# Patient Record
Sex: Female | Born: 1955 | Race: White | Hispanic: No | Smoking: Former smoker
Health system: Southern US, Community
[De-identification: ages and names within clinical notes are randomized; demographics above are authoritative.]

## PROBLEM LIST (undated history)

## (undated) DIAGNOSIS — C73 Malignant neoplasm of thyroid gland: Secondary | ICD-10-CM

## (undated) DIAGNOSIS — C3490 Malignant neoplasm of unspecified part of unspecified bronchus or lung: Secondary | ICD-10-CM

## (undated) DIAGNOSIS — I10 Essential (primary) hypertension: Secondary | ICD-10-CM

## (undated) DIAGNOSIS — I890 Lymphedema, not elsewhere classified: Secondary | ICD-10-CM

## (undated) DIAGNOSIS — N390 Urinary tract infection, site not specified: Secondary | ICD-10-CM

## (undated) DIAGNOSIS — R51 Headache: Secondary | ICD-10-CM

## (undated) DIAGNOSIS — F410 Panic disorder [episodic paroxysmal anxiety] without agoraphobia: Secondary | ICD-10-CM

## (undated) DIAGNOSIS — Z85850 Personal history of malignant neoplasm of thyroid: Secondary | ICD-10-CM

## (undated) DIAGNOSIS — Z923 Personal history of irradiation: Secondary | ICD-10-CM

## (undated) DIAGNOSIS — R300 Dysuria: Principal | ICD-10-CM

## (undated) DIAGNOSIS — M1990 Unspecified osteoarthritis, unspecified site: Secondary | ICD-10-CM

## (undated) DIAGNOSIS — G4700 Insomnia, unspecified: Principal | ICD-10-CM

## (undated) DIAGNOSIS — E876 Hypokalemia: Secondary | ICD-10-CM

## (undated) DIAGNOSIS — T50905A Adverse effect of unspecified drugs, medicaments and biological substances, initial encounter: Secondary | ICD-10-CM

## (undated) HISTORY — DX: Dysuria: R30.0

## (undated) HISTORY — DX: Adverse effect of unspecified drugs, medicaments and biological substances, initial encounter: T50.905A

## (undated) HISTORY — PX: THYROIDECTOMY: SHX17

## (undated) HISTORY — DX: Headache: R51

## (undated) HISTORY — DX: Essential (primary) hypertension: I10

## (undated) HISTORY — DX: Urinary tract infection, site not specified: N39.0

## (undated) HISTORY — DX: Insomnia, unspecified: G47.00

## (undated) HISTORY — DX: Unspecified osteoarthritis, unspecified site: M19.90

## (undated) HISTORY — DX: Hypokalemia: E87.6

## (undated) HISTORY — DX: Lymphedema, not elsewhere classified: I89.0

---

## 1997-03-08 ENCOUNTER — Ambulatory Visit (HOSPITAL_COMMUNITY): Admission: RE | Admit: 1997-03-08 | Discharge: 1997-03-08 | Payer: Self-pay | Admitting: Obstetrics & Gynecology

## 1998-04-28 ENCOUNTER — Ambulatory Visit (HOSPITAL_COMMUNITY): Admission: RE | Admit: 1998-04-28 | Discharge: 1998-04-28 | Payer: Self-pay | Admitting: *Deleted

## 1998-05-25 ENCOUNTER — Ambulatory Visit (HOSPITAL_COMMUNITY): Admission: RE | Admit: 1998-05-25 | Discharge: 1998-05-25 | Payer: Self-pay | Admitting: *Deleted

## 1998-11-10 ENCOUNTER — Ambulatory Visit (HOSPITAL_COMMUNITY): Admission: RE | Admit: 1998-11-10 | Discharge: 1998-11-10 | Payer: Self-pay | Admitting: *Deleted

## 1999-04-14 ENCOUNTER — Encounter: Payer: Self-pay | Admitting: Neurology

## 1999-04-14 ENCOUNTER — Ambulatory Visit (HOSPITAL_COMMUNITY): Admission: RE | Admit: 1999-04-14 | Discharge: 1999-04-14 | Payer: Self-pay | Admitting: Neurology

## 1999-05-01 ENCOUNTER — Encounter: Admission: RE | Admit: 1999-05-01 | Discharge: 1999-05-01 | Payer: Self-pay | Admitting: Otolaryngology

## 1999-05-01 ENCOUNTER — Encounter: Payer: Self-pay | Admitting: Otolaryngology

## 1999-05-03 ENCOUNTER — Other Ambulatory Visit: Admission: RE | Admit: 1999-05-03 | Discharge: 1999-05-03 | Payer: Self-pay | Admitting: Otolaryngology

## 1999-05-17 ENCOUNTER — Ambulatory Visit (HOSPITAL_COMMUNITY): Admission: RE | Admit: 1999-05-17 | Discharge: 1999-05-17 | Payer: Self-pay | Admitting: Otolaryngology

## 1999-05-17 ENCOUNTER — Encounter: Payer: Self-pay | Admitting: Otolaryngology

## 1999-05-17 ENCOUNTER — Encounter (INDEPENDENT_AMBULATORY_CARE_PROVIDER_SITE_OTHER): Payer: Self-pay | Admitting: Specialist

## 1999-08-04 ENCOUNTER — Ambulatory Visit (HOSPITAL_COMMUNITY): Admission: RE | Admit: 1999-08-04 | Discharge: 1999-08-05 | Payer: Self-pay | Admitting: Otolaryngology

## 1999-08-04 ENCOUNTER — Encounter (INDEPENDENT_AMBULATORY_CARE_PROVIDER_SITE_OTHER): Payer: Self-pay | Admitting: *Deleted

## 1999-11-06 ENCOUNTER — Ambulatory Visit (HOSPITAL_COMMUNITY): Admission: RE | Admit: 1999-11-06 | Discharge: 1999-11-06 | Payer: Self-pay | Admitting: Otolaryngology

## 1999-11-06 ENCOUNTER — Encounter: Payer: Self-pay | Admitting: Otolaryngology

## 1999-11-24 ENCOUNTER — Ambulatory Visit (HOSPITAL_COMMUNITY): Admission: RE | Admit: 1999-11-24 | Discharge: 1999-11-24 | Payer: Self-pay | Admitting: Endocrinology

## 1999-11-24 ENCOUNTER — Encounter: Payer: Self-pay | Admitting: Endocrinology

## 1999-12-14 ENCOUNTER — Ambulatory Visit (HOSPITAL_COMMUNITY): Admission: RE | Admit: 1999-12-14 | Discharge: 1999-12-14 | Payer: Self-pay | Admitting: Endocrinology

## 1999-12-14 ENCOUNTER — Encounter: Payer: Self-pay | Admitting: Endocrinology

## 2000-04-15 ENCOUNTER — Ambulatory Visit (HOSPITAL_COMMUNITY): Admission: RE | Admit: 2000-04-15 | Discharge: 2000-04-15 | Payer: Self-pay | Admitting: Endocrinology

## 2000-04-15 ENCOUNTER — Encounter: Payer: Self-pay | Admitting: Endocrinology

## 2001-09-22 ENCOUNTER — Other Ambulatory Visit: Admission: RE | Admit: 2001-09-22 | Discharge: 2001-09-22 | Payer: Self-pay | Admitting: Obstetrics & Gynecology

## 2002-03-09 ENCOUNTER — Ambulatory Visit (HOSPITAL_COMMUNITY): Admission: RE | Admit: 2002-03-09 | Discharge: 2002-03-09 | Payer: Self-pay | Admitting: Endocrinology

## 2002-03-09 ENCOUNTER — Encounter: Payer: Self-pay | Admitting: Endocrinology

## 2002-03-13 ENCOUNTER — Ambulatory Visit (HOSPITAL_COMMUNITY): Admission: RE | Admit: 2002-03-13 | Discharge: 2002-03-13 | Payer: Self-pay | Admitting: Endocrinology

## 2002-12-02 ENCOUNTER — Other Ambulatory Visit: Admission: RE | Admit: 2002-12-02 | Discharge: 2002-12-02 | Payer: Self-pay | Admitting: Obstetrics & Gynecology

## 2003-02-18 ENCOUNTER — Emergency Department (HOSPITAL_COMMUNITY): Admission: EM | Admit: 2003-02-18 | Discharge: 2003-02-18 | Payer: Self-pay | Admitting: Emergency Medicine

## 2003-09-26 ENCOUNTER — Encounter: Admission: RE | Admit: 2003-09-26 | Discharge: 2003-09-26 | Payer: Self-pay | Admitting: Orthopedic Surgery

## 2004-06-09 ENCOUNTER — Other Ambulatory Visit: Admission: RE | Admit: 2004-06-09 | Discharge: 2004-06-09 | Payer: Self-pay | Admitting: Obstetrics & Gynecology

## 2005-05-07 ENCOUNTER — Encounter (HOSPITAL_COMMUNITY): Admission: RE | Admit: 2005-05-07 | Discharge: 2005-08-05 | Payer: Self-pay | Admitting: Endocrinology

## 2005-07-11 ENCOUNTER — Ambulatory Visit (HOSPITAL_COMMUNITY): Admission: RE | Admit: 2005-07-11 | Discharge: 2005-07-11 | Payer: Self-pay | Admitting: Endocrinology

## 2005-08-24 ENCOUNTER — Encounter: Admission: RE | Admit: 2005-08-24 | Discharge: 2005-08-24 | Payer: Self-pay | Admitting: Endocrinology

## 2005-08-31 ENCOUNTER — Encounter: Admission: RE | Admit: 2005-08-31 | Discharge: 2005-08-31 | Payer: Self-pay | Admitting: Endocrinology

## 2006-09-25 ENCOUNTER — Encounter: Admission: RE | Admit: 2006-09-25 | Discharge: 2006-09-25 | Payer: Self-pay | Admitting: Endocrinology

## 2006-10-23 ENCOUNTER — Ambulatory Visit (HOSPITAL_COMMUNITY): Admission: RE | Admit: 2006-10-23 | Discharge: 2006-10-23 | Payer: Self-pay | Admitting: Endocrinology

## 2008-01-19 ENCOUNTER — Emergency Department (HOSPITAL_COMMUNITY): Admission: EM | Admit: 2008-01-19 | Discharge: 2008-01-19 | Payer: Self-pay | Admitting: Emergency Medicine

## 2008-03-19 ENCOUNTER — Encounter: Admission: RE | Admit: 2008-03-19 | Discharge: 2008-03-19 | Payer: Self-pay | Admitting: Orthopedic Surgery

## 2008-06-07 ENCOUNTER — Encounter: Admission: RE | Admit: 2008-06-07 | Discharge: 2008-06-07 | Payer: Self-pay | Admitting: Internal Medicine

## 2008-06-21 ENCOUNTER — Encounter (HOSPITAL_COMMUNITY): Admission: RE | Admit: 2008-06-21 | Discharge: 2008-09-14 | Payer: Self-pay | Admitting: Internal Medicine

## 2009-05-18 ENCOUNTER — Encounter: Admission: RE | Admit: 2009-05-18 | Discharge: 2009-05-18 | Payer: Self-pay | Admitting: Orthopedic Surgery

## 2009-06-06 ENCOUNTER — Ambulatory Visit (HOSPITAL_BASED_OUTPATIENT_CLINIC_OR_DEPARTMENT_OTHER): Admission: RE | Admit: 2009-06-06 | Discharge: 2009-06-06 | Payer: Self-pay | Admitting: Orthopedic Surgery

## 2009-06-08 ENCOUNTER — Encounter: Admission: RE | Admit: 2009-06-08 | Discharge: 2009-06-08 | Payer: Self-pay | Admitting: Internal Medicine

## 2009-06-11 ENCOUNTER — Emergency Department (HOSPITAL_COMMUNITY): Admission: EM | Admit: 2009-06-11 | Discharge: 2009-06-11 | Payer: Self-pay | Admitting: Emergency Medicine

## 2010-02-18 ENCOUNTER — Encounter: Payer: Self-pay | Admitting: Internal Medicine

## 2010-02-19 ENCOUNTER — Encounter: Payer: Self-pay | Admitting: Neurology

## 2010-02-19 ENCOUNTER — Encounter: Payer: Self-pay | Admitting: Endocrinology

## 2010-04-18 LAB — POCT HEMOGLOBIN-HEMACUE: Hemoglobin: 15.2 g/dL — ABNORMAL HIGH (ref 12.0–15.0)

## 2010-05-04 ENCOUNTER — Inpatient Hospital Stay (INDEPENDENT_AMBULATORY_CARE_PROVIDER_SITE_OTHER)
Admission: RE | Admit: 2010-05-04 | Discharge: 2010-05-04 | Disposition: A | Discharge status: Home / Self Care | Payer: Self-pay | Source: Ambulatory Visit | Attending: Family Medicine | Admitting: Family Medicine

## 2010-05-04 DIAGNOSIS — L0231 Cutaneous abscess of buttock: Secondary | ICD-10-CM

## 2010-05-06 ENCOUNTER — Inpatient Hospital Stay (HOSPITAL_COMMUNITY)
Admission: RE | Admit: 2010-05-06 | Discharge: 2010-05-06 | Disposition: A | Discharge status: Home / Self Care | Payer: Self-pay | Source: Ambulatory Visit | Attending: Emergency Medicine | Admitting: Emergency Medicine

## 2010-05-09 LAB — TSH: TSH: 18.685 u[IU]/mL — ABNORMAL HIGH (ref 0.350–4.500)

## 2010-06-16 NOTE — Op Note (Signed)
[place]. [facility]  Patient:    Jasmine Grant, Jasmine Grant                      MRN: [id number] Proc. Date: 08/04/99 Adm. Date:  [id number] Attending:  Serena Colonel H CC:         Warrick Parisian, M.D.             Genene Churn. Love, M.D.                           Operative Report  PREOPERATIVE DIAGNOSIS: Thyroid mass.  POSTOPERATIVE DIAGNOSIS:  Papillary thyroid carcinoma with metastatic nodes.  OPERATION/PROCEDURE: 1. Total thyroidectomy. 2. Peritracheal node dissection.  ANESTHESIA: General endotracheal.  SURGEON:    Jefry H. Pollyann Kennedy, M.D.  ASSISTANT: Kathy Breach, M.D.  COMPLICATIONS: None.  ESTIMATED BLOOD LOSS: 25 cc.  REFERRING PHYSICIAN: Warrick Parisian, M.D.  OPERATIVE FINDINGS: Enlarged left lobe of the thyroid with multinodular changes, frozen section diagnosis revealed 1.1 cm papillary thyroid carcinoma. Two paratracheal nodes on the left side. Frozen section diagnosis consistent with metastatic papillary thyroid carcinoma. Three small slightly firm pretracheal lymph nodes. No frozen sections obtained. Right lobe of the thyroid was normal to palpation and inspection. This was sent for routine pathologic evaluation without any frozen section. No other palpable neck masses in this region of the neck.  HISTORY OF PRESENT ILLNESS:  This is a 55 year old who had an incidental finding of a thyroid mass and a parapharyngeal mass recently after evaluation of dizziness. 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 Risks, benefits, alternatives and complications of the procedure were explained to the patient. She seemed to understand and agreed to the surgery.  DESCRIPTION OF PROCEDURE:  The patient was taken to the operating room and placed on the operating table in the supine position. Following induction of general endotracheal anesthesia, the neck was prepped and draped in the standard fashion and a controlled  roll was placed underneath the shoulder blades.  Total thyroidectomy:  A low collar incision was outlined with the marking pen and a #10 scalpel was used to incise the skin and subcutaneous tissue. Electrocautery was used to dissect down through the platysma and subplatysmal flap was developed superiorly up to the thyroid cartilage. The thyroid retractor was used to keep the flaps retracted. A midline fascia was divided and the diastasis of the strap muscles was dissected as well. The left lobe was dissected first. Carefully dissection, staying immediately on the capsule of the thyroid was accomplished. The middle thyroid main was doubly ligated between clamps and divided. The superior pole vascular attachments were carefully divided with hemostats and 4-0 silk ties. The superior lobe was brought down and the medial and lower aspect of the lobe were dissected from surrounding tissue. Two paratracheal lymph nodes were identified posterior to the dissection of the gland on the left side. The left lobe was freed up from the ligament and the isthmus was divided. No specific parathyroids were identified. The recurrent nerve was then dissected out of the underlying fascial tissue in order to safely remove the two lymph nodes that were identified. These were both sent for frozen section evaluation and based on the findings, a decision was made to perform a total thyroidectomy. The right side was dissected free in a similar fashion. There were no abnormalities palpated in the right lobe, although the lobe was somewhat large. A  superior parathyroid was identified and preserved with its blood supply. No other parathyroids were identified. The recurrent nerve was identified after the lobe was removed on the right side as well.  Paratracheal node dissection: Careful palpation around the pretracheal fat pad was accomplished and several small, slightly firm nodes were palpable. These were dissected  with their surrounding fibrofatty tissue, staying medial to the inferior thyroid artery in order to not jeopardize the blood supply to the inferior parathyroids. The recurrent nerves were deep to the level of dissection. The fat pad was transected, removed, and sent for pathologic evaluation with routine pathology. The wound was palpated thoroughly and there were no other masses palpable. The wound was irrigated with saline solution and closed in layers using 3-0 chromic on the strap muscle fascia in the platysmal layer and running 4-0 nylon on the skin. A 7 French round drain was left in the wound and exited through the right side of the incision and secured in place with the nylon suture. The patient was awakened from anesthesia, transferred to the recovery room in stable condition. DD:  08/04/99 TD:  08/04/99 Job: [id number]

## 2010-06-16 NOTE — H&P (Signed)
[place]. [facility]  Patient:    Jasmine Grant, Jasmine Grant                      MRN: [id number] Adm. Date:  [id number] Attending:  Serena Colonel H CC:         Warrick Parisian, M.D.                         History and Physical  ADMISSION DIAGNOSIS:  Thyroid mass.  PRIMARY CARE PHYSICIAN:  Dr. Warrick Parisian.  HISTORY OF PRESENT ILLNESS:  This is a 55 year old who last year was having severe dizzy spells.  She underwent an MRI scan which found an incidental finding of a  parapharyngeal space mass on the left side.  Further evaluation revealed a thyroid mass as well on the left side.  She underwent a thyroid scan which revealed a "hot "nodule on the left inferior thyroid.  There were no other abnormalities identified at the time.  She has since undergone a fine needle aspiration biopsy of the thyroid mass and the parapharyngeal mass.  The thyroid mass revealed Hurthles cells.  The parapharyngeal mass revealed benign lymphocytes.  The decision was ade to perform a hemithyroidectomy, to rule out Hurtle cell carcinoma.  PAST MEDICAL HISTORY:  Otherwise unremarkable.  SOCIAL HISTORY:  One-pack-per-day smoker.  REVIEW OF SYSTEMS:  Negative.  PHYSICAL EXAMINATION:  GENERAL:  A healthy-appearing lady.  NECK:  There is a 2.0 to 3.0 cm firm palpable mass in the left inferior thyroid  gland.  There is no other palpable mass. There is slight mass affect with medial displacement of the left tonsil.  The oral cavity, pharynx, and indirect laryngoscopy are all completely normal.  CHEST:  Clear to auscultation.  HEART:  A regular rate and rhythm.  ABDOMEN:  Soft.  IMPRESSION:  Thyroid mass, suspicious for a thyroid carcinoma.  PLAN:  To admit to the hospital and undergo a hemithyroidectomy with possible total thyroidectomy. DD:  08/04/99 TD:  08/04/99 Job: [id number]

## 2010-08-10 ENCOUNTER — Other Ambulatory Visit (HOSPITAL_COMMUNITY): Payer: Self-pay | Admitting: Obstetrics & Gynecology

## 2010-08-10 DIAGNOSIS — Z1231 Encounter for screening mammogram for malignant neoplasm of breast: Secondary | ICD-10-CM

## 2010-08-18 ENCOUNTER — Ambulatory Visit (HOSPITAL_COMMUNITY)
Admission: RE | Admit: 2010-08-18 | Discharge: 2010-08-18 | Disposition: A | Discharge status: Home / Self Care | Payer: Self-pay | Source: Ambulatory Visit | Attending: Obstetrics & Gynecology | Admitting: Obstetrics & Gynecology

## 2010-08-18 DIAGNOSIS — Z1231 Encounter for screening mammogram for malignant neoplasm of breast: Secondary | ICD-10-CM

## 2010-10-16 ENCOUNTER — Other Ambulatory Visit: Payer: Self-pay | Admitting: Internal Medicine

## 2010-10-16 DIAGNOSIS — C73 Malignant neoplasm of thyroid gland: Secondary | ICD-10-CM

## 2010-10-23 ENCOUNTER — Other Ambulatory Visit: Payer: Self-pay

## 2010-10-31 ENCOUNTER — Ambulatory Visit
Admission: RE | Admit: 2010-10-31 | Discharge: 2010-10-31 | Disposition: A | Discharge status: Home / Self Care | Payer: No Typology Code available for payment source | Source: Ambulatory Visit | Attending: Internal Medicine | Admitting: Internal Medicine

## 2010-10-31 DIAGNOSIS — C73 Malignant neoplasm of thyroid gland: Secondary | ICD-10-CM

## 2010-11-03 LAB — POCT I-STAT, CHEM 8
BUN: 7 mg/dL (ref 6–23)
Calcium, Ion: 1.16 mmol/L (ref 1.12–1.32)
Chloride: 106 mEq/L (ref 96–112)
Creatinine, Ser: 0.7 mg/dL (ref 0.4–1.2)
Glucose, Bld: 103 mg/dL — ABNORMAL HIGH (ref 70–99)
HCT: 45 % (ref 36.0–46.0)
Hemoglobin: 15.3 g/dL — ABNORMAL HIGH (ref 12.0–15.0)
Potassium: 4 mEq/L (ref 3.5–5.1)
Sodium: 141 mEq/L (ref 135–145)
TCO2: 29 mmol/L (ref 0–100)

## 2010-11-03 LAB — POCT CARDIAC MARKERS
CKMB, poc: <1 ng/mL — ABNORMAL LOW (ref 1.0–8.0)
Troponin i, poc: <0.05 ng/mL (ref 0.00–0.09)

## 2011-02-25 ENCOUNTER — Encounter (HOSPITAL_COMMUNITY): Payer: Self-pay | Admitting: Emergency Medicine

## 2011-02-25 ENCOUNTER — Emergency Department (HOSPITAL_COMMUNITY): Payer: Self-pay

## 2011-02-25 ENCOUNTER — Other Ambulatory Visit: Payer: Self-pay

## 2011-02-25 ENCOUNTER — Emergency Department (HOSPITAL_COMMUNITY)
Admission: EM | Admit: 2011-02-25 | Discharge: 2011-02-25 | Disposition: A | Discharge status: Home / Self Care | Payer: Self-pay | Attending: Emergency Medicine | Admitting: Emergency Medicine

## 2011-02-25 DIAGNOSIS — R0789 Other chest pain: Secondary | ICD-10-CM | POA: Insufficient documentation

## 2011-02-25 DIAGNOSIS — R002 Palpitations: Secondary | ICD-10-CM | POA: Insufficient documentation

## 2011-02-25 DIAGNOSIS — R059 Cough, unspecified: Secondary | ICD-10-CM | POA: Insufficient documentation

## 2011-02-25 DIAGNOSIS — F411 Generalized anxiety disorder: Secondary | ICD-10-CM | POA: Insufficient documentation

## 2011-02-25 DIAGNOSIS — R0602 Shortness of breath: Secondary | ICD-10-CM | POA: Insufficient documentation

## 2011-02-25 DIAGNOSIS — F419 Anxiety disorder, unspecified: Secondary | ICD-10-CM

## 2011-02-25 DIAGNOSIS — F410 Panic disorder [episodic paroxysmal anxiety] without agoraphobia: Secondary | ICD-10-CM | POA: Insufficient documentation

## 2011-02-25 DIAGNOSIS — R51 Headache: Secondary | ICD-10-CM | POA: Insufficient documentation

## 2011-02-25 DIAGNOSIS — R05 Cough: Secondary | ICD-10-CM | POA: Insufficient documentation

## 2011-02-25 DIAGNOSIS — Z79899 Other long term (current) drug therapy: Secondary | ICD-10-CM | POA: Insufficient documentation

## 2011-02-25 HISTORY — DX: Panic disorder (episodic paroxysmal anxiety): F41.0

## 2011-02-25 LAB — DIFFERENTIAL
Eosinophils Relative: 2 % (ref 0–5)
Lymphocytes Relative: 39 % (ref 12–46)
Lymphs Abs: 3.2 10*3/uL (ref 0.7–4.0)
Monocytes Absolute: 0.6 10*3/uL (ref 0.1–1.0)
Monocytes Relative: 8 % (ref 3–12)

## 2011-02-25 LAB — BASIC METABOLIC PANEL
CO2: 28 mEq/L (ref 19–32)
Calcium: 10.1 mg/dL (ref 8.4–10.5)
Creatinine, Ser: 0.78 mg/dL (ref 0.50–1.10)
Glucose, Bld: 115 mg/dL — ABNORMAL HIGH (ref 70–99)

## 2011-02-25 LAB — CBC
HCT: 40.5 % (ref 36.0–46.0)
MCH: 30.9 pg (ref 26.0–34.0)
MCHC: 33.6 g/dL (ref 30.0–36.0)
MCV: 92 fL (ref 78.0–100.0)
Platelets: 212 10*3/uL (ref 150–400)

## 2011-02-25 LAB — POCT I-STAT TROPONIN I

## 2011-02-25 MED ORDER — METOCLOPRAMIDE HCL 5 MG/ML IJ SOLN
10.0000 mg | Freq: Once | INTRAMUSCULAR | Status: AC
Start: 2011-02-25 — End: 2011-02-25
  Administered 2011-02-25: 10 mg via INTRAVENOUS
  Filled 2011-02-25: qty 2

## 2011-02-25 MED ORDER — DIPHENHYDRAMINE HCL 50 MG/ML IJ SOLN
25.0000 mg | Freq: Once | INTRAMUSCULAR | Status: AC
  Administered 2011-02-25: 21:00:00 via INTRAVENOUS
  Filled 2011-02-25: qty 1

## 2011-02-25 MED ORDER — SODIUM CHLORIDE 0.9 % IV BOLUS (SEPSIS)
1000.0000 mL | Freq: Once | INTRAVENOUS | Status: AC
  Administered 2011-02-25: 1000 mL via INTRAVENOUS

## 2011-02-25 NOTE — ED Notes (Signed)
PT. REPORTS MIDSTERNAL CHEST PAIN WITH SOB AND NAUSEA ONSET THIS EVENING , NO PAIN DURING ENCOUNTER , STATES TOOK 1/2 TAB OF XANAX WITH RELIEF.

## 2011-02-25 NOTE — ED Provider Notes (Signed)
History     CSN: [id number]  Arrival date & time 02/25/11  1911   First MD Initiated Contact with Patient 02/25/11 2028      Chief Complaint  Patient presents with  . Chest Pain    (Consider location/radiation/quality/duration/timing/severity/associated sxs/prior treatment) HPI Comments: Resolved with xanax.  Currently sx free.  Has had intermittent HA for 2 days with no vision changes or neuro sx  Patient is a 56 y.o. female presenting with chest pain. The history is provided by the patient. No language interpreter was used.  Chest Pain The chest pain began 3 - 5 hours ago. Chest pain occurs constantly. The chest pain is unchanged. The pain is associated with coughing. The severity of the pain is mild. The quality of the pain is described as aching. The pain does not radiate. Exacerbated by: anxiety. Primary symptoms include shortness of breath and palpitations. Pertinent negatives for primary symptoms include no fever, no fatigue, no syncope, no cough, no abdominal pain, no nausea, no vomiting and no dizziness.  The palpitations began 3 to 5 hours ago. An episode of palpitations lasts for 11 to 30 minutes. The palpitations have occurred 1 time(s). The palpitations occur while anxious. The palpitations also occurred with shortness of breath. The palpitations did not occur with dizziness.  Pertinent negatives for associated symptoms include no numbness and no weakness.     Past Medical History  Diagnosis Date  . Panic attack     Past Surgical History  Procedure Date  . Thyroidectomy     No family history on file.  History  Substance Use Topics  . Smoking status: Former Games developer  . Smokeless tobacco: Not on file  . Alcohol Use: Yes    OB History    Grav Para Term Preterm Abortions TAB SAB Ect Mult Living                  Review of Systems  Constitutional: Negative for fever, activity change, appetite change and fatigue.  HENT: Negative for congestion, sore throat,  rhinorrhea, neck pain and neck stiffness.   Respiratory: Positive for shortness of breath. Negative for cough.   Cardiovascular: Positive for chest pain and palpitations. Negative for syncope.  Gastrointestinal: Negative for nausea, vomiting and abdominal pain.  Genitourinary: Negative for dysuria, urgency, frequency and flank pain.  Musculoskeletal: Negative for myalgias, back pain and arthralgias.  Neurological: Positive for headaches. Negative for dizziness, weakness, light-headedness and numbness.  All other systems reviewed and are negative.    Allergies  Penicillins  Home Medications   Current Outpatient Rx  Name Route Sig Dispense Refill  . ALPRAZOLAM 0.25 MG PO TABS Oral Take 0.125 mg by mouth daily as needed. For anxiety    . GABAPENTIN 100 MG PO CAPS Oral Take 100 mg by mouth 3 (three) times daily as needed. For nerve pain/headache    . IBUPROFEN 200 MG PO TABS Oral Take 400 mg by mouth once.    Marland Kitchen LEVOTHYROXINE SODIUM 125 MCG PO TABS Oral Take 125 mcg by mouth daily.    Marland Kitchen LEVOTHYROXINE SODIUM 137 MCG PO TABS Oral Take 137 mcg by mouth daily.      BP 128/49  Pulse 71  Temp(Src) 97.1 F (36.2 C) (Oral)  Resp 18  SpO2 99%  Physical Exam  Nursing note and vitals reviewed. Constitutional: She is oriented to person, place, and time. She appears well-developed and well-nourished. No distress.  HENT:  Head: Normocephalic and atraumatic.  Mouth/Throat: Oropharynx is clear  and moist. No oropharyngeal exudate.  Eyes: Conjunctivae and EOM are normal. Pupils are equal, round, and reactive to light.  Neck: Normal range of motion. Neck supple.  Cardiovascular: Normal rate, regular rhythm, normal heart sounds and intact distal pulses.  Exam reveals no gallop and no friction rub.   No murmur heard. Pulmonary/Chest: Effort normal and breath sounds normal. No respiratory distress. She exhibits no tenderness.  Abdominal: Soft. Bowel sounds are normal. There is no tenderness.    Musculoskeletal: Normal range of motion. She exhibits no tenderness.  Neurological: She is alert and oriented to person, place, and time. No cranial nerve deficit.  Skin: Skin is warm and dry.    ED Course  Procedures (including critical care time)   Date: 02/25/2011  Rate: 81  Rhythm: normal sinus rhythm  QRS Axis: normal  Intervals: normal  ST/T Wave abnormalities: normal  Conduction Disutrbances:none  Narrative Interpretation:   Old EKG Reviewed: unchanged  Labs Reviewed  BASIC METABOLIC PANEL - Abnormal; Notable for the following:    Glucose, Bld 115 (*)    All other components within normal limits  CBC  DIFFERENTIAL  POCT I-STAT TROPONIN I  TROPONIN I  I-STAT TROPONIN I   Dg Chest 2 View  02/25/2011  *RADIOLOGY REPORT*  Clinical Data: Chest pressure, shortness of breath, former smoker, panic attack  CHEST - 2 VIEW  Comparison: 05/18/2009  Findings: Borderline enlargement of cardiac silhouette. Mediastinal contours and pulmonary vascularity normal. Minimal peribronchial thickening. No definite infiltrate, pleural effusion or pneumothorax. Slight hyperinflation. Bones unremarkable.  IMPRESSION: Minimal bronchitic changes of hyperinflation. No acute infiltrate.  Original Report Authenticated By: Lollie Marrow, M.D.     1. Chest pain, atypical   2. Anxiety   3. Panic attack       MDM  Delta troponin is negative. I have low concern for cardiac etiology of her pain. She has significant history of anxiety with panic type symptoms I feel this is consistent with this. Symptoms resolved with Xanax. Instructed to take these as needed. Instructed to followup with her primary care physician. I have no concern for pulmonary embolus.        Dayton Bailiff, MD 02/25/11 2325

## 2011-10-15 ENCOUNTER — Other Ambulatory Visit (HOSPITAL_COMMUNITY): Payer: Self-pay | Admitting: Obstetrics & Gynecology

## 2011-10-15 DIAGNOSIS — Z1231 Encounter for screening mammogram for malignant neoplasm of breast: Secondary | ICD-10-CM

## 2011-10-16 ENCOUNTER — Ambulatory Visit (HOSPITAL_COMMUNITY)
Admission: RE | Admit: 2011-10-16 | Discharge: 2011-10-16 | Disposition: A | Discharge status: Home / Self Care | Source: Ambulatory Visit | Attending: Obstetrics & Gynecology | Admitting: Obstetrics & Gynecology

## 2011-10-16 DIAGNOSIS — Z1231 Encounter for screening mammogram for malignant neoplasm of breast: Secondary | ICD-10-CM

## 2011-12-12 ENCOUNTER — Encounter (INDEPENDENT_AMBULATORY_CARE_PROVIDER_SITE_OTHER): Admitting: Ophthalmology

## 2011-12-12 DIAGNOSIS — H43819 Vitreous degeneration, unspecified eye: Secondary | ICD-10-CM

## 2011-12-12 DIAGNOSIS — H2510 Age-related nuclear cataract, unspecified eye: Secondary | ICD-10-CM

## 2011-12-12 DIAGNOSIS — H538 Other visual disturbances: Secondary | ICD-10-CM

## 2011-12-12 DIAGNOSIS — H26499 Other secondary cataract, unspecified eye: Secondary | ICD-10-CM

## 2012-01-02 ENCOUNTER — Other Ambulatory Visit (INDEPENDENT_AMBULATORY_CARE_PROVIDER_SITE_OTHER): Admitting: Ophthalmology

## 2012-02-04 ENCOUNTER — Encounter (HOSPITAL_COMMUNITY): Payer: Self-pay | Admitting: *Deleted

## 2012-02-04 ENCOUNTER — Emergency Department (INDEPENDENT_AMBULATORY_CARE_PROVIDER_SITE_OTHER)
Admission: EM | Admit: 2012-02-04 | Discharge: 2012-02-04 | Disposition: A | Discharge status: Home / Self Care | Payer: Self-pay | Source: Home / Self Care | Attending: Emergency Medicine | Admitting: Emergency Medicine

## 2012-02-04 DIAGNOSIS — L0291 Cutaneous abscess, unspecified: Secondary | ICD-10-CM

## 2012-02-04 MED ORDER — SULFAMETHOXAZOLE-TMP DS 800-160 MG PO TABS: 2.0000 | ORAL_TABLET | Freq: Two times a day (BID) | ORAL | Status: DC

## 2012-02-04 NOTE — ED Notes (Signed)
Abscess on left buttocks - no relief from hot compresses, ointment or antibiotics

## 2012-02-04 NOTE — ED Provider Notes (Signed)
Chief Complaint  Patient presents with  . Abscess    History of Present Illness:    The patient is a 57 year old female with a five-day history of an abscess on her left buttock. She had a similar abscess about a year ago in about the same area. She denies any drainage, fever, chills, or other skin lesions. She has had no history of MRSA or diabetes. She is allergic to penicillin. Her only medication is Synthroid.  Review of Systems:  Other than noted above, the patient denies any of the following symptoms: Systemic:  No fever, chills or sweats. Skin:  No rash or itching.  PMFSH:  Past medical history, family history, social history, meds, and allergies were reviewed.  No history of diabetes or prior history of abscesses or MRSA.  Physical Exam:   Vital signs:  BP 159/79  Pulse 68  Temp 98 F (36.7 C) (Oral)  Resp 18  SpO2 99% Skin:  There is a tender, 2 cm abscess on the middle of the left buttock. This was not draining any pus.  Skin exam was otherwise normal.  No rash. Ext:  Distal pulses were full, patient has full ROM of all joints.  Procedure:  Verbal informed consent was obtained.  The patient was informed of the risks and benefits of the procedure and understands and accepts.  Identity of the patient was verified verbally and by wristband.   The abscess area described above was prepped with Betadine and alcohol and anesthetized with 5 mL of 2% Xylocaine without epinephrine.  Using a #11 scalpel blade, a singe straight incision was made into the area of fluctulence, yielding a small amount of prurulent drainage.  Routine cultures were obtained.  Blunt dissection was used to break up loculations and the resulting wound cavity was packed with 1/4 inch Iodoform gauze.  A sterile pressure dressing was applied.  Assessment:  The encounter diagnosis was Abscess.  Plan:   1.  The following meds were prescribed:   New Prescriptions   SULFAMETHOXAZOLE-TRIMETHOPRIM (BACTRIM DS) 800-160  MG PER TABLET    Take 2 tablets by mouth 2 (two) times daily.   2.  The patient was instructed in symptomatic care and handouts were given. 3.  The patient was instructed to leave the dressing in place and return again in 48 hours for packing removal.   Reuben Likes, MD 02/04/12 [phone]

## 2012-02-07 ENCOUNTER — Emergency Department (INDEPENDENT_AMBULATORY_CARE_PROVIDER_SITE_OTHER)
Admission: EM | Admit: 2012-02-07 | Discharge: 2012-02-07 | Disposition: A | Discharge status: Home / Self Care | Payer: Self-pay | Source: Home / Self Care | Attending: Emergency Medicine | Admitting: Emergency Medicine

## 2012-02-07 ENCOUNTER — Encounter (HOSPITAL_COMMUNITY): Payer: Self-pay

## 2012-02-07 DIAGNOSIS — L0291 Cutaneous abscess, unspecified: Secondary | ICD-10-CM

## 2012-02-07 DIAGNOSIS — L0390 Cellulitis, unspecified: Secondary | ICD-10-CM

## 2012-02-07 LAB — CULTURE, ROUTINE-ABSCESS: Special Requests: NORMAL

## 2012-02-07 NOTE — ED Provider Notes (Addendum)
Chief Complaint  Patient presents with  . Wound Check    History of Present Illness:    Jasmine Grant was seen here 2 days ago by me for an abscess on her left buttock. This was incised and drained. Cultures are now growing out some coagulase negative staph. No other growth on the culture. She's left the packing in place. She's been taking the antibiotics. The pain is better. She does not have any fever or chills.  Review of Systems:  Other than noted above, the patient denies any of the following symptoms: Systemic:  No fever, chills or sweats. Skin:  No rash or itching.  PMFSH:  Past medical history, family history, social history, meds, and allergies were reviewed.  No history of diabetes or prior history of abscesses or MRSA.  Physical Exam:   Vital signs:  BP 137/76  Pulse 70  Temp 98.5 F (36.9 C) (Oral)  Resp 16  SpO2 100% Skin:  The dressing was removed and she has a packed abscess in the left buttock. There is minimal surrounding induration, no erythema. The packing was removed and the wound cavity appeared clean.  Skin exam was otherwise normal.  No rash. Ext:  Distal pulses were full, patient has full ROM of all joints.  Procedure:  Verbal informed consent was obtained.  The patient was informed of the risks and benefits of the procedure and understands and accepts.  Identity of the patient was verified verbally and by wristband.   The packing was removed, the area was cleansed with saline and antibiotic ointment was applied.  A sterile pressure dressing was applied.  Assessment:  The encounter diagnosis was Abscess.  Plan:   1.  The following meds were prescribed:   New Prescriptions   No medications on file   2.  The patient was instructed in symptomatic care and handouts were given. 3.  The patient was instructed to return again as needed.   Reuben Likes, MD 02/07/12 1440  Reuben Likes, MD 02/07/12 1440

## 2012-02-07 NOTE — ED Notes (Signed)
abscess recheck, packing removal; NAD

## 2012-02-07 NOTE — ED Notes (Addendum)
Abscess culture: few Staph. species (coagulase neg.).  Pt. treated with Bactrim DS.  Message sent to Dr. Lorenz Coaster. Vassie Moselle 02/07/2012 1/10 Treatment adequate per Dr. Lorenz Coaster. Vassie Moselle 02/15/2012

## 2012-07-15 ENCOUNTER — Other Ambulatory Visit: Payer: Self-pay

## 2013-12-13 ENCOUNTER — Encounter (HOSPITAL_COMMUNITY): Payer: Self-pay | Admitting: Emergency Medicine

## 2013-12-13 ENCOUNTER — Inpatient Hospital Stay (HOSPITAL_COMMUNITY)
Admission: EM | Admit: 2013-12-13 | Discharge: 2013-12-17 | DRG: 181 | Disposition: A | Discharge status: Home / Self Care | Payer: No Typology Code available for payment source | Attending: Internal Medicine | Admitting: Internal Medicine

## 2013-12-13 ENCOUNTER — Emergency Department (HOSPITAL_COMMUNITY): Payer: No Typology Code available for payment source

## 2013-12-13 ENCOUNTER — Inpatient Hospital Stay (HOSPITAL_COMMUNITY): Payer: No Typology Code available for payment source

## 2013-12-13 DIAGNOSIS — G902 Horner's syndrome: Secondary | ICD-10-CM | POA: Diagnosis present

## 2013-12-13 DIAGNOSIS — T402X5A Adverse effect of other opioids, initial encounter: Secondary | ICD-10-CM | POA: Diagnosis not present

## 2013-12-13 DIAGNOSIS — Z87891 Personal history of nicotine dependence: Secondary | ICD-10-CM | POA: Diagnosis not present

## 2013-12-13 DIAGNOSIS — M542 Cervicalgia: Secondary | ICD-10-CM | POA: Diagnosis present

## 2013-12-13 DIAGNOSIS — M8448XA Pathological fracture, other site, initial encounter for fracture: Secondary | ICD-10-CM | POA: Diagnosis present

## 2013-12-13 DIAGNOSIS — C7801 Secondary malignant neoplasm of right lung: Secondary | ICD-10-CM | POA: Diagnosis present

## 2013-12-13 DIAGNOSIS — I871 Compression of vein: Secondary | ICD-10-CM | POA: Diagnosis present

## 2013-12-13 DIAGNOSIS — R030 Elevated blood-pressure reading, without diagnosis of hypertension: Secondary | ICD-10-CM | POA: Diagnosis not present

## 2013-12-13 DIAGNOSIS — Z6829 Body mass index (BMI) 29.0-29.9, adult: Secondary | ICD-10-CM

## 2013-12-13 DIAGNOSIS — C7951 Secondary malignant neoplasm of bone: Secondary | ICD-10-CM | POA: Diagnosis present

## 2013-12-13 DIAGNOSIS — M899 Disorder of bone, unspecified: Secondary | ICD-10-CM | POA: Diagnosis present

## 2013-12-13 DIAGNOSIS — C787 Secondary malignant neoplasm of liver and intrahepatic bile duct: Secondary | ICD-10-CM | POA: Diagnosis present

## 2013-12-13 DIAGNOSIS — R200 Anesthesia of skin: Secondary | ICD-10-CM

## 2013-12-13 DIAGNOSIS — E039 Hypothyroidism, unspecified: Secondary | ICD-10-CM | POA: Diagnosis present

## 2013-12-13 DIAGNOSIS — R112 Nausea with vomiting, unspecified: Secondary | ICD-10-CM | POA: Diagnosis not present

## 2013-12-13 DIAGNOSIS — Z85850 Personal history of malignant neoplasm of thyroid: Secondary | ICD-10-CM | POA: Diagnosis not present

## 2013-12-13 DIAGNOSIS — C3491 Malignant neoplasm of unspecified part of right bronchus or lung: Secondary | ICD-10-CM

## 2013-12-13 DIAGNOSIS — IMO0002 Reserved for concepts with insufficient information to code with codable children: Secondary | ICD-10-CM

## 2013-12-13 DIAGNOSIS — Z880 Allergy status to penicillin: Secondary | ICD-10-CM

## 2013-12-13 DIAGNOSIS — Z79891 Long term (current) use of opiate analgesic: Secondary | ICD-10-CM | POA: Diagnosis not present

## 2013-12-13 DIAGNOSIS — R229 Localized swelling, mass and lump, unspecified: Secondary | ICD-10-CM

## 2013-12-13 DIAGNOSIS — K5909 Other constipation: Secondary | ICD-10-CM | POA: Diagnosis present

## 2013-12-13 DIAGNOSIS — G9619 Other disorders of meninges, not elsewhere classified: Secondary | ICD-10-CM

## 2013-12-13 DIAGNOSIS — F411 Generalized anxiety disorder: Secondary | ICD-10-CM | POA: Diagnosis present

## 2013-12-13 DIAGNOSIS — Z79899 Other long term (current) drug therapy: Secondary | ICD-10-CM | POA: Diagnosis not present

## 2013-12-13 DIAGNOSIS — Z9889 Other specified postprocedural states: Secondary | ICD-10-CM

## 2013-12-13 DIAGNOSIS — G96198 Other disorders of meninges, not elsewhere classified: Secondary | ICD-10-CM | POA: Diagnosis present

## 2013-12-13 DIAGNOSIS — E663 Overweight: Secondary | ICD-10-CM | POA: Diagnosis present

## 2013-12-13 DIAGNOSIS — M4850XA Collapsed vertebra, not elsewhere classified, site unspecified, initial encounter for fracture: Secondary | ICD-10-CM

## 2013-12-13 DIAGNOSIS — K5903 Drug induced constipation: Secondary | ICD-10-CM | POA: Diagnosis not present

## 2013-12-13 DIAGNOSIS — R918 Other nonspecific abnormal finding of lung field: Secondary | ICD-10-CM

## 2013-12-13 DIAGNOSIS — Z791 Long term (current) use of non-steroidal anti-inflammatories (NSAID): Secondary | ICD-10-CM | POA: Diagnosis not present

## 2013-12-13 DIAGNOSIS — C801 Malignant (primary) neoplasm, unspecified: Secondary | ICD-10-CM

## 2013-12-13 DIAGNOSIS — C3411 Malignant neoplasm of upper lobe, right bronchus or lung: Secondary | ICD-10-CM | POA: Diagnosis present

## 2013-12-13 DIAGNOSIS — IMO0001 Reserved for inherently not codable concepts without codable children: Secondary | ICD-10-CM | POA: Diagnosis present

## 2013-12-13 DIAGNOSIS — R202 Paresthesia of skin: Secondary | ICD-10-CM

## 2013-12-13 HISTORY — DX: Personal history of malignant neoplasm of thyroid: Z85.850

## 2013-12-13 LAB — COMPREHENSIVE METABOLIC PANEL
ALT: 14 U/L (ref 0–35)
AST: 17 U/L (ref 0–37)
Albumin: 3.7 g/dL (ref 3.5–5.2)
Alkaline Phosphatase: 80 U/L (ref 39–117)
Anion gap: 12 (ref 5–15)
BUN: 10 mg/dL (ref 6–23)
CO2: 26 mEq/L (ref 19–32)
CREATININE: 0.58 mg/dL (ref 0.50–1.10)
Calcium: 9.2 mg/dL (ref 8.4–10.5)
Chloride: 101 mEq/L (ref 96–112)
GFR calc non Af Amer: >90 mL/min (ref >90)
GLUCOSE: 129 mg/dL — AB (ref 70–99)
Potassium: 3.9 mEq/L (ref 3.7–5.3)
SODIUM: 139 meq/L (ref 137–147)
TOTAL PROTEIN: 7.5 g/dL (ref 6.0–8.3)
Total Bilirubin: 0.3 mg/dL (ref 0.3–1.2)

## 2013-12-13 LAB — CBC WITH DIFFERENTIAL/PLATELET
Basophils Absolute: 0 10*3/uL (ref 0.0–0.1)
Basophils Relative: 0 % (ref 0–1)
EOS ABS: 0.1 10*3/uL (ref 0.0–0.7)
Eosinophils Relative: 1 % (ref 0–5)
HEMATOCRIT: 42.7 % (ref 36.0–46.0)
HEMOGLOBIN: 13.9 g/dL (ref 12.0–15.0)
LYMPHS ABS: 2.3 10*3/uL (ref 0.7–4.0)
Lymphocytes Relative: 29 % (ref 12–46)
MCH: 30.3 pg (ref 26.0–34.0)
MCHC: 32.6 g/dL (ref 30.0–36.0)
MCV: 93 fL (ref 78.0–100.0)
MONO ABS: 0.5 10*3/uL (ref 0.1–1.0)
MONOS PCT: 7 % (ref 3–12)
NEUTROS ABS: 5 10*3/uL (ref 1.7–7.7)
NEUTROS PCT: 63 % (ref 43–77)
Platelets: 203 10*3/uL (ref 150–400)
RBC: 4.59 MIL/uL (ref 3.87–5.11)
RDW: 12.8 % (ref 11.5–15.5)
WBC: 8 10*3/uL (ref 4.0–10.5)

## 2013-12-13 LAB — I-STAT CHEM 8, ED
BUN: 9 mg/dL (ref 6–23)
CHLORIDE: 103 meq/L (ref 96–112)
Calcium, Ion: 1.19 mmol/L (ref 1.12–1.23)
Creatinine, Ser: 0.6 mg/dL (ref 0.50–1.10)
GLUCOSE: 130 mg/dL — AB (ref 70–99)
HEMATOCRIT: 45 % (ref 36.0–46.0)
Hemoglobin: 15.3 g/dL — ABNORMAL HIGH (ref 12.0–15.0)
POTASSIUM: 3.8 meq/L (ref 3.7–5.3)
SODIUM: 142 meq/L (ref 137–147)
TCO2: 25 mmol/L (ref 0–100)

## 2013-12-13 MED ORDER — GADOBENATE DIMEGLUMINE 529 MG/ML IV SOLN
20.0000 mL | Freq: Once | INTRAVENOUS | Status: AC | PRN
  Administered 2013-12-13: 20 mL via INTRAVENOUS

## 2013-12-13 MED ORDER — PROMETHAZINE HCL 25 MG/ML IJ SOLN
12.5000 mg | Freq: Once | INTRAMUSCULAR | Status: AC
  Administered 2013-12-13: 12.5 mg via INTRAVENOUS
  Filled 2013-12-13: qty 1

## 2013-12-13 MED ORDER — ONDANSETRON HCL 4 MG/2ML IJ SOLN
4.0000 mg | Freq: Once | INTRAMUSCULAR | Status: AC
  Administered 2013-12-13: 4 mg via INTRAVENOUS
  Filled 2013-12-13: qty 2

## 2013-12-13 MED ORDER — HYDROMORPHONE HCL 1 MG/ML IJ SOLN
1.0000 mg | Freq: Once | INTRAMUSCULAR | Status: AC
  Administered 2013-12-13: 1 mg via INTRAVENOUS
  Filled 2013-12-13: qty 1

## 2013-12-13 MED ORDER — DEXAMETHASONE SODIUM PHOSPHATE 10 MG/ML IJ SOLN
10.0000 mg | Freq: Once | INTRAMUSCULAR | Status: AC
  Administered 2013-12-13: 10 mg via INTRAVENOUS
  Filled 2013-12-13: qty 1

## 2013-12-13 MED ORDER — IOHEXOL 300 MG/ML  SOLN
100.0000 mL | Freq: Once | INTRAMUSCULAR | Status: AC | PRN
  Administered 2013-12-13: 100 mL via INTRAVENOUS

## 2013-12-13 MED ORDER — LORAZEPAM 2 MG/ML IJ SOLN
1.0000 mg | Freq: Once | INTRAMUSCULAR | Status: AC
  Administered 2013-12-13: 1 mg via INTRAVENOUS
  Filled 2013-12-13: qty 1

## 2013-12-13 NOTE — ED Notes (Signed)
Dr. Vertell Limber at bedside (neurosurgery)

## 2013-12-13 NOTE — ED Notes (Signed)
Oncology at bedside.

## 2013-12-13 NOTE — Consult Note (Signed)
Reason for Consult: Apical chest mass right upper lung Referring Physician: Maryan Rued, MD  Jasmine Grant is an 58 y.o. female.  HPI: One month history of increasingly severe neck and upper back pain shooting into her right arm and chest.  Patient has noticed right hand weakness and came to ER when she developed numbness transiently into her legs.  This has now resolved.  She denies other areas of weakness.  She has a history of prior ulnar nerve surgery on the left and some residual numbness in this distribution.  She has a history of thyroid cancer 10 years ago.  She is a former smoker.  She denies cough or weight loss.  Past Medical History  Diagnosis Date  . Panic attack     Past Surgical History  Procedure Laterality Date  . Thyroidectomy      No family history on file.  Social History:  reports that she has quit smoking. She does not have any smokeless tobacco history on file. She reports that she drinks alcohol. She reports that she does not use illicit drugs.  Allergies:  Allergies  Allergen Reactions  . Penicillins     Unknown reaction as a child    Medications: I have reviewed the patient's current medications.  Results for orders placed or performed during the hospital encounter of 12/13/13 (from the past 48 hour(s))  CBC with Differential     Status: None   Collection Time: 12/13/13  8:18 PM  Result Value Ref Range   WBC 8.0 4.0 - 10.5 K/uL   RBC 4.59 3.87 - 5.11 MIL/uL   Hemoglobin 13.9 12.0 - 15.0 g/dL   HCT 42.7 36.0 - 46.0 %   MCV 93.0 78.0 - 100.0 fL   MCH 30.3 26.0 - 34.0 pg   MCHC 32.6 30.0 - 36.0 g/dL   RDW 12.8 11.5 - 15.5 %   Platelets 203 150 - 400 K/uL   Neutrophils Relative % 63 43 - 77 %   Neutro Abs 5.0 1.7 - 7.7 K/uL   Lymphocytes Relative 29 12 - 46 %   Lymphs Abs 2.3 0.7 - 4.0 K/uL   Monocytes Relative 7 3 - 12 %   Monocytes Absolute 0.5 0.1 - 1.0 K/uL   Eosinophils Relative 1 0 - 5 %   Eosinophils Absolute 0.1 0.0 - 0.7 K/uL   Basophils  Relative 0 0 - 1 %   Basophils Absolute 0.0 0.0 - 0.1 K/uL  Comprehensive metabolic panel     Status: Abnormal   Collection Time: 12/13/13  8:18 PM  Result Value Ref Range   Sodium 139 137 - 147 mEq/L   Potassium 3.9 3.7 - 5.3 mEq/L   Chloride 101 96 - 112 mEq/L   CO2 26 19 - 32 mEq/L   Glucose, Bld 129 (H) 70 - 99 mg/dL   BUN 10 6 - 23 mg/dL   Creatinine, Ser 0.58 0.50 - 1.10 mg/dL   Calcium 9.2 8.4 - 10.5 mg/dL   Total Protein 7.5 6.0 - 8.3 g/dL   Albumin 3.7 3.5 - 5.2 g/dL   AST 17 0 - 37 U/L   ALT 14 0 - 35 U/L   Alkaline Phosphatase 80 39 - 117 U/L   Total Bilirubin 0.3 0.3 - 1.2 mg/dL   GFR calc non Af Amer >90 >90 mL/min   GFR calc Af Amer >90 >90 mL/min    Comment: (NOTE) The eGFR has been calculated using the CKD EPI equation. This calculation has  not been validated in all clinical situations. eGFR's persistently <90 mL/min signify possible Chronic Kidney Disease.    Anion gap 12 5 - 15  I-stat chem 8, ed     Status: Abnormal   Collection Time: 12/13/13  8:53 PM  Result Value Ref Range   Sodium 142 137 - 147 mEq/L   Potassium 3.8 3.7 - 5.3 mEq/L   Chloride 103 96 - 112 mEq/L   BUN 9 6 - 23 mg/dL   Creatinine, Ser 0.60 0.50 - 1.10 mg/dL   Glucose, Bld 130 (H) 70 - 99 mg/dL   Calcium, Ion 1.19 1.12 - 1.23 mmol/L   TCO2 25 0 - 100 mmol/L   Hemoglobin 15.3 (H) 12.0 - 15.0 g/dL   HCT 45.0 36.0 - 46.0 %    Mr Cervical Spine W Wo Contrast  12/13/2013   CLINICAL DATA:  Severe neck pain and back pain for the past month, worse in the past 2 weeks. BILATERAL hand tingling. Some lower extremity symptoms developing today. Initial encounter.  EXAM: MRI CERVICAL AND THORACIC SPINE WITHOUT AND WITH CONTRAST  TECHNIQUE: Multiplanar and multiecho pulse sequences of the cervical spine, to include the craniocervical junction and cervicothoracic junction, and thoracici spine, were obtained without and with intravenous contrast.  CONTRAST:  74m MULTIHANCE GADOBENATE DIMEGLUMINE 529  MG/ML IV SOLN  COMPARISON:  None.  FINDINGS: MRI CERVICAL SPINE FINDINGS  The patient was unable to remain motionless for the exam. Small or subtle lesions could be overlooked.  There is mild reversal of the normal cervical lordosis. There is degenerative disc disease with disc space narrowing at C5-6 and C6-7 with accompanying endplate reactive changes. A large Schmorl's node projects inferiorly from C6-C7.  No compressive lesion at C4-5 or above. At C5-6, there is mild stenosis due to a central and rightward protrusion. Mild cord flattening. RIGHT subarticular zone narrowing.  At C6-C7, there is moderate central canal stenosis due to a broad-based protrusion. Mild cord flattening. BILATERAL posterior element hypertrophy contributes to spinal stenosis and LEFT greater than RIGHT foraminal narrowing.  Mild annular bulging at C7-T1 with mild facet mediated slip of 2 mm, noncompressive.  RIGHT chest tumor is described in more detail in the thoracic section, but there appears to be RIGHT paravertebral tumor extending cephalad along the course of the brachial plexus, possibly adjacent to the foramen at C6-7 on the RIGHT as seen on image 28 series 20. Further delineation is hampered due to patient motion.  No worrisome osseous lesions in the cervical spine. Mild pannus surrounds the odontoid. Unremarkable posterior fossa. LEFT vertebral dominant.  MRI THORACIC SPINE FINDINGS  The patient was unable to remain motionless for the exam. Small or subtle lesions could be overlooked.  There is a large RIGHT chest paravertebral mass estimated size of 63 x 65 x 95 mm which likely compresses the superior vena cava, and surrounds the RIGHT-sided proximal bronchi. CT chest with contrast recommended. There is direct invasion of the T1 vertebral body on the RIGHT.  There is a early pathologic fracture T1 with slight anterior wedging. The T1 vertebral body is completely replaced with tumor. There is epidural tumor in the spinal canal  ventrally extending equally to the RIGHT and LEFT. Tumor extends into both T1-T2 neural foramina.Mild cord compression is evident. Tumor extends into the brachial plexus along the course of the RIGHT T1 and T2 nerves. Surgical consultation is warranted.  No other areas of visualized thoracic vertebral body tumor or epidural tumor. Advanced degenerative CHANGE AT  T8-9 with end plate reactive changes. T2 hyperintense paravertebral cyst on the RIGHT at T2-3 dorsally, non worrisome.  There are T2 hyperintense lesions in the liver incompletely evaluated, which could represent metastatic disease. CT abdomen and pelvis recommended. No definite pleural effusion.  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 Surgical consultation is warranted.  63 x 65 x 95 mm RIGHT chest paravertebral mass likely compressing the superior vena cava and surrounding the RIGHT-sided bronchi. 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 Possible Pancoast tumor. Brachial plexus involvement on the RIGHT. Possible liver metastases. CT of the chest, abdomen, and pelvis with contrast recommended for further evaluation.   Electronically Signed   By: Rolla Flatten M.D.   On: 12/13/2013 20:16   Mr Thoracic Spine W Wo Contrast  12/13/2013   CLINICAL DATA:  Severe neck pain and back pain for the past month, worse in the past 2 weeks. BILATERAL hand tingling. Some lower extremity symptoms developing today. Initial encounter.  EXAM: MRI CERVICAL AND THORACIC SPINE WITHOUT AND WITH CONTRAST  TECHNIQUE: Multiplanar and multiecho pulse sequences of the cervical spine, to include the craniocervical junction and cervicothoracic junction, and thoracici spine, were obtained without and with intravenous contrast.  CONTRAST:  105m MULTIHANCE GADOBENATE DIMEGLUMINE 529 MG/ML IV SOLN  COMPARISON:  None.  FINDINGS: MRI CERVICAL SPINE FINDINGS  The patient was unable to remain motionless for the exam. Small or subtle lesions could  be overlooked.  There is mild reversal of the normal cervical lordosis. There is degenerative disc disease with disc space narrowing at C5-6 and C6-7 with accompanying endplate reactive changes. A large Schmorl's node projects inferiorly from C6-C7.  No compressive lesion at C4-5 or above. At C5-6, there is mild stenosis due to a central and rightward protrusion. Mild cord flattening. RIGHT subarticular zone narrowing.  At C6-C7, there is moderate central canal stenosis due to a broad-based protrusion. Mild cord flattening. BILATERAL posterior element hypertrophy contributes to spinal stenosis and LEFT greater than RIGHT foraminal narrowing.  Mild annular bulging at C7-T1 with mild facet mediated slip of 2 mm, noncompressive.  RIGHT chest tumor is described in more detail in the thoracic section, but there appears to be RIGHT paravertebral tumor extending cephalad along the course of the brachial plexus, possibly adjacent to the foramen at C6-7 on the RIGHT as seen on image 28 series 20. Further delineation is hampered due to patient motion.  No worrisome osseous lesions in the cervical spine. Mild pannus surrounds the odontoid. Unremarkable posterior fossa. LEFT vertebral dominant.  MRI THORACIC SPINE FINDINGS  The patient was unable to remain motionless for the exam. Small or subtle lesions could be overlooked.  There is a large RIGHT chest paravertebral mass estimated size of 63 x 65 x 95 mm which likely compresses the superior vena cava, and surrounds the RIGHT-sided proximal bronchi. CT chest with contrast recommended. There is direct invasion of the T1 vertebral body on the RIGHT.  There is a early pathologic fracture T1 with slight anterior wedging. The T1 vertebral body is completely replaced with tumor. There is epidural tumor in the spinal canal ventrally extending equally to the RIGHT and LEFT. Tumor extends into both T1-T2 neural foramina.Mild cord compression is evident. Tumor extends into the brachial  plexus along the course of the RIGHT T1 and T2 nerves. Surgical consultation is warranted.  No other areas of visualized thoracic vertebral body tumor or epidural tumor. Advanced degenerative CHANGE AT T8-9 with end plate reactive changes. T2 hyperintense  paravertebral cyst on the RIGHT at T2-3 dorsally, non worrisome.  There are T2 hyperintense lesions in the liver incompletely evaluated, which could represent metastatic disease. CT abdomen and pelvis recommended. No definite pleural effusion.  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 Surgical consultation is warranted.  63 x 65 x 95 mm RIGHT chest paravertebral mass likely compressing the superior vena cava and surrounding the RIGHT-sided bronchi. 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 Possible Pancoast tumor. Brachial plexus involvement on the RIGHT. Possible liver metastases. CT of the chest, abdomen, and pelvis with contrast recommended for further evaluation.   Electronically Signed   By: Rolla Flatten M.D.   On: 12/13/2013 20:16    Review of Systems - Negative except neck pain, weakness, numbness    Blood pressure 137/60, pulse 55, temperature 98.7 F (37.1 C), resp. rate 16, height 6' 1"  (1.854 m), weight 103.42 kg (228 lb), SpO2 97 %. Physical Exam Patient is awake, alert, conversant.  She has neck pain at the cervico-thoracic junction.  PERRL, EOMI.  Face symmetric.  Hearing intact.  No palpable supraclavicular mass or nodes.  Strength full all motor groups except 4/5 right hand intrinsics with right C 8 distribution of numbness.  Reflexes symmetric upper and lower extremities.  Assessment/Plan: Right apical mass, most likely lung cancer.  This is locally invasive and involves the T 1 vertebral body.  It also extends into the ventral epidural space without severe cord compression.  She needs to be admitted to the Medicine service with efforts to control her pain.  She should be started on decadron  (10 mg initially, followed by 4 mg Q 6 H).  She will need a CT-guided needle biopsy of this mass (which can be done by Radiology in AM) and will need Radiation Therapy on an expedited basis (I consulted Dr. Lisbeth Renshaw, who has come to see the patient and will proceed with Simulation in AM).  Dr. Maryan Rued plans to complete metastatic workup with chest, abdomen and pelvis CT scans.  The MRI was worrisome for the possibility of liver metastases.  I have spoken at length with the patient, who understands what is going on, both in terms of likely diagnosis, need for admission and workup.  I do not believe there is any role at the present for neurosurgical intervention.  Peggyann Shoals, MD 12/13/2013, 9:50 PM

## 2013-12-13 NOTE — ED Notes (Signed)
Dr. Arnoldo Morale, internal medicine, at bedside.

## 2013-12-13 NOTE — Consult Note (Signed)
Radiation Oncology         [phone] ________________________________  Name: Jasmine Grant MRN: [id number]  Date: 12/13/2013  DOB: [birth year]   DIAGNOSIS: The primary encounter diagnosis was Mass of lung. Diagnoses of Numbness and tingling of both legs below knees, Mass, Pathologic compression fracture of spine, initial encounter, and Mass in epidural space were also pertinent to this visit.   HISTORY OF PRESENT ILLNESS::Jasmine Grant is a 58 y.o. female who is seen for an initial consultation visit regarding the patient's diagnosis of apparent metastatic disease involving the spine.  The patient presented with severe neck and upper back pain with pain shooting into the right arm and chest. She states that she also has noticed some right-handed weakness and she came into the emergency room. She also notes some numbness briefly in her legs bilaterally. This has resolved.  The patient states that her neck pain has been present for approximately 1 month but became much more severe over the last several days. She has a history of thyroid cancer 10 years ago. No evidence of recurrence with the patient indicating that a small amount of cancer was seen when she underwent a thyroidectomy. She did undergo radioactive iodine treatment subsequently but did not receive any further treatment. She is a former smoker. She denies any weight loss.    PREVIOUS RADIATION THERAPY: Yes radioactive iodine as noted above. The patient has not received any external beam radiation treatment.   PAST MEDICAL HISTORY:  has a past medical history of Panic attack.     PAST SURGICAL HISTORY: Past Surgical History  Procedure Laterality Date  . Thyroidectomy       FAMILY HISTORY: family history is not on file.   SOCIAL HISTORY:  reports that she has quit smoking. She does not have any smokeless tobacco history on file. She reports that she drinks alcohol. She reports that she does not use illicit  drugs.   ALLERGIES: Penicillins   MEDICATIONS:  No current facility-administered medications for this encounter.   Current Outpatient Prescriptions  Medication Sig Dispense Refill  . ALPRAZolam (XANAX) 0.25 MG tablet Take 0.125 mg by mouth daily as needed. For anxiety    . cyclobenzaprine (FLEXERIL) 10 MG tablet Take 10 mg by mouth daily as needed for muscle spasms.    Marland Kitchen ibuprofen (ADVIL,MOTRIN) 200 MG tablet Take 400 mg by mouth once.    Marland Kitchen levothyroxine (SYNTHROID, LEVOTHROID) 125 MCG tablet Take 125 mcg by mouth daily.    Marland Kitchen levothyroxine (SYNTHROID, LEVOTHROID) 137 MCG tablet Take 137 mcg by mouth daily.    . meloxicam (MOBIC) 15 MG tablet Take 15 mg by mouth daily.    . methocarbamol (ROBAXIN) 500 MG tablet Take 500 mg by mouth 2 (two) times daily.    Marland Kitchen oxyCODONE-acetaminophen (PERCOCET/ROXICET) 5-325 MG per tablet Take 1 tablet by mouth every 6 (six) hours as needed for severe pain.    Marland Kitchen sulfamethoxazole-trimethoprim (BACTRIM DS) 800-160 MG per tablet Take 2 tablets by mouth 2 (two) times daily. (Patient not taking: Reported on 12/13/2013) 40 tablet 0     REVIEW OF SYSTEMS:  A 15 point review of systems is documented in the electronic medical record. This was obtained by the nursing staff. However, I reviewed this with the patient to discuss relevant findings and make appropriate changes.  Pertinent items are noted in HPI.    PHYSICAL EXAM:  height is 6\' 1"  (1.854 m) and weight is 228 lb (103.42 kg). Her temperature is 98.7  F (37.1 C). Her blood pressure is 137/60 and her pulse is 55. Her respiration is 16 and oxygen saturation is 97%.   ECOG = 1  0 - Asymptomatic (Fully active, able to carry on all predisease activities without restriction)  1 - Symptomatic but completely ambulatory (Restricted in physically strenuous activity but ambulatory and able to carry out work of a light or sedentary nature. For example, light housework, office work)  2 - Symptomatic, <50% in bed  during the day (Ambulatory and capable of all self care but unable to carry out any work activities. Up and about more than 50% of waking hours)  3 - Symptomatic, >50% in bed, but not bedbound (Capable of only limited self-care, confined to bed or chair 50% or more of waking hours)  4 - Bedbound (Completely disabled. Cannot carry on any self-care. Totally confined to bed or chair)  5 - Death   Eustace Pen MM, Creech RH, Tormey DC, et al. [phone]). "Toxicity and response criteria of the [facility]". [place] Oncol. 5 (6): 649-55  The patient exhibits a slight loss of strength in the right upper extremity.4+/5 with 5 out of 5 strength in the left upper extremity   LABORATORY DATA:  Lab Results  Component Value Date   WBC 8.0 12/13/2013   HGB 15.3* 12/13/2013   HCT 45.0 12/13/2013   MCV 93.0 12/13/2013   PLT 203 12/13/2013   Lab Results  Component Value Date   NA 142 12/13/2013   K 3.8 12/13/2013   CL 103 12/13/2013   CO2 26 12/13/2013   Lab Results  Component Value Date   ALT 14 12/13/2013   AST 17 12/13/2013   ALKPHOS 80 12/13/2013   BILITOT 0.3 12/13/2013      RADIOGRAPHY: Mr Cervical Spine W Wo Contrast  12/13/2013   CLINICAL DATA:  Severe neck pain and back pain for the past month, worse in the past 2 weeks. BILATERAL hand tingling. Some lower extremity symptoms developing today. Initial encounter.  EXAM: MRI CERVICAL AND THORACIC SPINE WITHOUT AND WITH CONTRAST  TECHNIQUE: Multiplanar and multiecho pulse sequences of the cervical spine, to include the craniocervical junction and cervicothoracic junction, and thoracici spine, were obtained without and with intravenous contrast.  CONTRAST:  80mL MULTIHANCE GADOBENATE DIMEGLUMINE 529 MG/ML IV SOLN  COMPARISON:  None.  FINDINGS: MRI CERVICAL SPINE FINDINGS  The patient was unable to remain motionless for the exam. Small or subtle lesions could be overlooked.  There is mild reversal of the normal cervical  lordosis. There is degenerative disc disease with disc space narrowing at C5-6 and C6-7 with accompanying endplate reactive changes. A large Schmorl's node projects inferiorly from C6-C7.  No compressive lesion at C4-5 or above. At C5-6, there is mild stenosis due to a central and rightward protrusion. Mild cord flattening. RIGHT subarticular zone narrowing.  At C6-C7, there is moderate central canal stenosis due to a broad-based protrusion. Mild cord flattening. BILATERAL posterior element hypertrophy contributes to spinal stenosis and LEFT greater than RIGHT foraminal narrowing.  Mild annular bulging at C7-T1 with mild facet mediated slip of 2 mm, noncompressive.  RIGHT chest tumor is described in more detail in the thoracic section, but there appears to be RIGHT paravertebral tumor extending cephalad along the course of the brachial plexus, possibly adjacent to the foramen at C6-7 on the RIGHT as seen on image 28 series 20. Further delineation is hampered due to patient motion.  No worrisome osseous lesions in the  cervical spine. Mild pannus surrounds the odontoid. Unremarkable posterior fossa. LEFT vertebral dominant.  MRI THORACIC SPINE FINDINGS  The patient was unable to remain motionless for the exam. Small or subtle lesions could be overlooked.  There is a large RIGHT chest paravertebral mass estimated size of 63 x 65 x 95 mm which likely compresses the superior vena cava, and surrounds the RIGHT-sided proximal bronchi. CT chest with contrast recommended. There is direct invasion of the T1 vertebral body on the RIGHT.  There is a early pathologic fracture T1 with slight anterior wedging. The T1 vertebral body is completely replaced with tumor. There is epidural tumor in the spinal canal ventrally extending equally to the RIGHT and LEFT. Tumor extends into both T1-T2 neural foramina.Mild cord compression is evident. Tumor extends into the brachial plexus along the course of the RIGHT T1 and T2 nerves.  Surgical consultation is warranted.  No other areas of visualized thoracic vertebral body tumor or epidural tumor. Advanced degenerative CHANGE AT T8-9 with end plate reactive changes. T2 hyperintense paravertebral cyst on the RIGHT at T2-3 dorsally, non worrisome.  There are T2 hyperintense lesions in the liver incompletely evaluated, which could represent metastatic disease. CT abdomen and pelvis recommended. No definite pleural effusion.  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 Surgical consultation is warranted.  63 x 65 x 95 mm RIGHT chest paravertebral mass likely compressing the superior vena cava and surrounding the RIGHT-sided bronchi. 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 Possible Pancoast tumor. Brachial plexus involvement on the RIGHT. Possible liver metastases. CT of the chest, abdomen, and pelvis with contrast recommended for further evaluation.   Electronically Signed   By: Rolla Flatten M.D.   On: 12/13/2013 20:16   Mr Thoracic Spine W Wo Contrast  12/13/2013   CLINICAL DATA:  Severe neck pain and back pain for the past month, worse in the past 2 weeks. BILATERAL hand tingling. Some lower extremity symptoms developing today. Initial encounter.  EXAM: MRI CERVICAL AND THORACIC SPINE WITHOUT AND WITH CONTRAST  TECHNIQUE: Multiplanar and multiecho pulse sequences of the cervical spine, to include the craniocervical junction and cervicothoracic junction, and thoracici spine, were obtained without and with intravenous contrast.  CONTRAST:  1mL MULTIHANCE GADOBENATE DIMEGLUMINE 529 MG/ML IV SOLN  COMPARISON:  None.  FINDINGS: MRI CERVICAL SPINE FINDINGS  The patient was unable to remain motionless for the exam. Small or subtle lesions could be overlooked.  There is mild reversal of the normal cervical lordosis. There is degenerative disc disease with disc space narrowing at C5-6 and C6-7 with accompanying endplate reactive changes. A large Schmorl's node  projects inferiorly from C6-C7.  No compressive lesion at C4-5 or above. At C5-6, there is mild stenosis due to a central and rightward protrusion. Mild cord flattening. RIGHT subarticular zone narrowing.  At C6-C7, there is moderate central canal stenosis due to a broad-based protrusion. Mild cord flattening. BILATERAL posterior element hypertrophy contributes to spinal stenosis and LEFT greater than RIGHT foraminal narrowing.  Mild annular bulging at C7-T1 with mild facet mediated slip of 2 mm, noncompressive.  RIGHT chest tumor is described in more detail in the thoracic section, but there appears to be RIGHT paravertebral tumor extending cephalad along the course of the brachial plexus, possibly adjacent to the foramen at C6-7 on the RIGHT as seen on image 28 series 20. Further delineation is hampered due to patient motion.  No worrisome osseous lesions in the cervical spine. Mild pannus surrounds the odontoid. Unremarkable  posterior fossa. LEFT vertebral dominant.  MRI THORACIC SPINE FINDINGS  The patient was unable to remain motionless for the exam. Small or subtle lesions could be overlooked.  There is a large RIGHT chest paravertebral mass estimated size of 63 x 65 x 95 mm which likely compresses the superior vena cava, and surrounds the RIGHT-sided proximal bronchi. CT chest with contrast recommended. There is direct invasion of the T1 vertebral body on the RIGHT.  There is a early pathologic fracture T1 with slight anterior wedging. The T1 vertebral body is completely replaced with tumor. There is epidural tumor in the spinal canal ventrally extending equally to the RIGHT and LEFT. Tumor extends into both T1-T2 neural foramina.Mild cord compression is evident. Tumor extends into the brachial plexus along the course of the RIGHT T1 and T2 nerves. Surgical consultation is warranted.  No other areas of visualized thoracic vertebral body tumor or epidural tumor. Advanced degenerative CHANGE AT T8-9 with end  plate reactive changes. T2 hyperintense paravertebral cyst on the RIGHT at T2-3 dorsally, non worrisome.  There are T2 hyperintense lesions in the liver incompletely evaluated, which could represent metastatic disease. CT abdomen and pelvis recommended. No definite pleural effusion.  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 Surgical consultation is warranted.  63 x 65 x 95 mm RIGHT chest paravertebral mass likely compressing the superior vena cava and surrounding the RIGHT-sided bronchi. 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 Possible Pancoast tumor. Brachial plexus involvement on the RIGHT. Possible liver metastases. CT of the chest, abdomen, and pelvis with contrast recommended for further evaluation.   Electronically Signed   By: Rolla Flatten M.D.   On: 12/13/2013 20:16       IMPRESSION/PLAN:  The patient's MRI scan shows findings consistent with metastatic cancer. Pathologic fracture of T1 with associated epidural tumor and early spinal cord compression at this level which is associated with a approximate 9.5 cm Pancoast tumor on the right with brachial plexus involvement. Possible liver metastasis seen. This appears most consistent at this time with a diagnosis of lung cancer.  I discussed with the patient the possible role of radiation treatment. At this time we do not have a pathologic diagnosis, and this will be pursued during the patient's hospital stay. In looking at the patient's current imaging, I would favor a 2 week course of treatment with traditional external beam radiation treatment encompassing the lung tumor and area of involvement in the upper T-spine. This would ideally begin after biopsy results have been obtained. This would consist of daily treatment and therefore transferred to [facility] long may be appropriate for the patient.  Recommendations: -decadron -neurosurgery consult completed with Dr. Vertell Limber who I've discussed the patient's  case with -Medical oncology consult -CT scan of the chest abdomen and pelvis -Based on the information available, CT-guided biopsy of the right upper lung mass as soon as possible -CT simulation as soon as possible at [facility] in anticipation of a likely course of palliative external beam radiation treatment - consideration of transfer to [facility]     ________________________________   Jodelle Gross, MD, PhD   **Disclaimer: This note was dictated with voice recognition software. Similar sounding words can inadvertently be transcribed and this note may contain transcription errors which may not have been corrected upon publication of note.**

## 2013-12-13 NOTE — H&P (Addendum)
[facility] Admission History and Physical       Jasmine Grant DQQ:[id number] DOB: 01-10-1956 DOA: 12/13/2013  Referring physician: EDP PCP: No primary care provider on file.  Specialists:   Chief Complaint: Neck and Back Pain  HPI: Jasmine Grant is a 58 y.o. female with a remote history of Thyroid Cancer who presents to the ED with complaints of worsening neck and back pain radiating down both arms x 1 month.    She reports that she had been seen at an [facility] and had x-rays and was given Vicodin but the Vicodin has not relieved the pain.   She was evaluated in the ED and had an MRI of the C-Spine and T-Spine which revealed a T-1 compression fracture with an epidural tumor causing cord compression, and a Left chest paravertebral mass compressing the SVC and Right sided bronchi, along with possible liver mets.   Neurosurgery Dr Vertell Limber and Radiation Oncology Dr. Lisbeth Renshaw were consulted and saw the patient in the ED.  Plans have been made to start radiation treatment in the AM.          Review of Systems:  Constitutional: No Weight Loss, No Weight Gain, Night Sweats, Fevers, Chills, Dizziness, Fatigue, or Generalized Weakness HEENT: No Headaches, Difficulty Swallowing,Tooth/Dental Problems,Sore Throat,  No Sneezing, Rhinitis, Ear Ache, Nasal Congestion, or Post Nasal Drip,  Cardio-vascular:  No Chest pain, Orthopnea, PND, Edema in Lower Extremities, Anasarca, Dizziness, Palpitations  Resp: No Dyspnea, No DOE, No Productive Cough, No Non-Productive Cough, No Hemoptysis, No Wheezing.    GI: No Heartburn, Indigestion, Abdominal Pain, Nausea, Vomiting, Diarrhea, Hematemesis, Hematochezia, Melena, Change in Bowel Habits,  Loss of Appetite  GU: No Dysuria, Change in Color of Urine, No Urgency or Frequency, No Flank pain.  Musculoskeletal: No Joint Pain or Swelling, No Decreased Range of Motion, No Back Pain.  Neurologic: No Syncope, No Seizures, Muscle Weakness, Paresthesia, Vision Disturbance  or Loss, No Diplopia, No Vertigo, No Difficulty Walking,  Skin: No Rash or Lesions. Psych: No Change in Mood or Affect, No Depression or Anxiety, No Memory loss, No Confusion, or Hallucinations   Past Medical History  Diagnosis Date  . Panic attack   . Hx of thyroid cancer     10 years ago      Past Surgical History  Procedure Laterality Date  . Thyroidectomy         Prior to Admission medications   Medication Sig Start Date End Date Taking? Authorizing Provider  ALPRAZolam (XANAX) 0.25 MG tablet Take 0.125 mg by mouth daily as needed. For anxiety   Yes Historical Provider, MD  cyclobenzaprine (FLEXERIL) 10 MG tablet Take 10 mg by mouth daily as needed for muscle spasms.   Yes Historical Provider, MD  ibuprofen (ADVIL,MOTRIN) 200 MG tablet Take 400 mg by mouth once.   Yes Historical Provider, MD  levothyroxine (SYNTHROID, LEVOTHROID) 125 MCG tablet Take 125 mcg by mouth daily.   Yes Historical Provider, MD  levothyroxine (SYNTHROID, LEVOTHROID) 137 MCG tablet Take 137 mcg by mouth daily.   Yes Historical Provider, MD  meloxicam (MOBIC) 15 MG tablet Take 15 mg by mouth daily.   Yes Historical Provider, MD  methocarbamol (ROBAXIN) 500 MG tablet Take 500 mg by mouth 2 (two) times daily.   Yes Historical Provider, MD  oxyCODONE-acetaminophen (PERCOCET/ROXICET) 5-325 MG per tablet Take 1 tablet by mouth every 6 (six) hours as needed for severe pain.   Yes Historical Provider, MD  sulfamethoxazole-trimethoprim (BACTRIM DS) 800-160 MG  per tablet Take 2 tablets by mouth 2 (two) times daily. Patient not taking: Reported on 12/13/2013 02/04/12   Harden Mo, MD      Allergies  Allergen Reactions  . Penicillins     Unknown reaction as a child     Social History:  reports that she has quit smoking. She does not have any smokeless tobacco history on file. She reports that she drinks alcohol. She reports that she does not use illicit drugs.     No family history on file.      Physical Exam:  GEN:  Pleasant Obese 58 y.o. Caucasian female examined and in no acute distress; cooperative with exam Filed Vitals:   12/13/13 2200 12/13/13 2215 12/13/13 2230 12/13/13 2245  BP: 137/75 142/71 143/57 139/62  Pulse: 61 59 61 63  Temp:      Resp:      Height:      Weight:      SpO2: 96% 97% 94% 93%   Blood pressure 139/62, pulse 63, temperature 98.7 F (37.1 C), resp. rate 16, height 6\' 1"  (1.854 m), weight 103.42 kg (228 lb), SpO2 93 %. PSYCH: She is alert and oriented x4; does not appear anxious does not appear depressed; affect is normal HEENT: Normocephalic and Atraumatic, Mucous membranes pink; PERRLA; EOM intact; Fundi:  Benign;  No scleral icterus, Nares: Patent, Oropharynx: Clear, Fair Dentition,    Neck:  FROM, No Cervical Lymphadenopathy nor Thyromegaly or Carotid Bruit; No JVD; Breasts:: Not examined CHEST WALL: No tenderness CHEST: Normal respiration, clear to auscultation bilaterally HEART: Regular rate and rhythm; no murmurs rubs or gallops BACK: No kyphosis or scoliosis; No CVA tenderness ABDOMEN: Positive Bowel Sounds, Obese, Soft Non-Tender; No Masses, No Organomegaly.    Rectal Exam: Not done EXTREMITIES: No Cyanosis, Clubbing, or Edema; No Ulcerations. Genitalia: not examined PULSES: 2+ and symmetric SKIN: Normal hydration no rash or ulceration CNS:  Alert and Oriented x 4, No focal Deficits  Vascular: pulses palpable throughout    Labs on Admission:  Basic Metabolic Panel:  Recent Labs Lab 12/13/13 2018 12/13/13 2053  NA 139 142  K 3.9 3.8  CL 101 103  CO2 26  --   GLUCOSE 129* 130*  BUN 10 9  CREATININE 0.58 0.60  CALCIUM 9.2  --    Liver Function Tests:  Recent Labs Lab 12/13/13 2018  AST 17  ALT 14  ALKPHOS 80  BILITOT 0.3  PROT 7.5  ALBUMIN 3.7   No results for input(s): LIPASE, AMYLASE in the last 168 hours. No results for input(s): AMMONIA in the last 168 hours. CBC:  Recent Labs Lab 12/13/13 2018  12/13/13 2053  WBC 8.0  --   NEUTROABS 5.0  --   HGB 13.9 15.3*  HCT 42.7 45.0  MCV 93.0  --   PLT 203  --    Cardiac Enzymes: No results for input(s): CKTOTAL, CKMB, CKMBINDEX, TROPONINI in the last 168 hours.  BNP (last 3 results) No results for input(s): PROBNP in the last 8760 hours. CBG: No results for input(s): GLUCAP in the last 168 hours.  Radiological Exams on Admission: Mr Cervical Spine W Wo Contrast  12/13/2013   CLINICAL DATA:  Severe neck pain and back pain for the past month, worse in the past 2 weeks. BILATERAL hand tingling. Some lower extremity symptoms developing today. Initial encounter.  EXAM: MRI CERVICAL AND THORACIC SPINE WITHOUT AND WITH CONTRAST  TECHNIQUE: Multiplanar and multiecho pulse sequences of the cervical  spine, to include the craniocervical junction and cervicothoracic junction, and thoracici spine, were obtained without and with intravenous contrast.  CONTRAST:  31mL MULTIHANCE GADOBENATE DIMEGLUMINE 529 MG/ML IV SOLN  COMPARISON:  None.  FINDINGS: MRI CERVICAL SPINE FINDINGS  The patient was unable to remain motionless for the exam. Small or subtle lesions could be overlooked.  There is mild reversal of the normal cervical lordosis. There is degenerative disc disease with disc space narrowing at C5-6 and C6-7 with accompanying endplate reactive changes. A large Schmorl's node projects inferiorly from C6-C7.  No compressive lesion at C4-5 or above. At C5-6, there is mild stenosis due to a central and rightward protrusion. Mild cord flattening. RIGHT subarticular zone narrowing.  At C6-C7, there is moderate central canal stenosis due to a broad-based protrusion. Mild cord flattening. BILATERAL posterior element hypertrophy contributes to spinal stenosis and LEFT greater than RIGHT foraminal narrowing.  Mild annular bulging at C7-T1 with mild facet mediated slip of 2 mm, noncompressive.  RIGHT chest tumor is described in more detail in the thoracic section, but  there appears to be RIGHT paravertebral tumor extending cephalad along the course of the brachial plexus, possibly adjacent to the foramen at C6-7 on the RIGHT as seen on image 28 series 20. Further delineation is hampered due to patient motion.  No worrisome osseous lesions in the cervical spine. Mild pannus surrounds the odontoid. Unremarkable posterior fossa. LEFT vertebral dominant.  MRI THORACIC SPINE FINDINGS  The patient was unable to remain motionless for the exam. Small or subtle lesions could be overlooked.  There is a large RIGHT chest paravertebral mass estimated size of 63 x 65 x 95 mm which likely compresses the superior vena cava, and surrounds the RIGHT-sided proximal bronchi. CT chest with contrast recommended. There is direct invasion of the T1 vertebral body on the RIGHT.  There is a early pathologic fracture T1 with slight anterior wedging. The T1 vertebral body is completely replaced with tumor. There is epidural tumor in the spinal canal ventrally extending equally to the RIGHT and LEFT. Tumor extends into both T1-T2 neural foramina.Mild cord compression is evident. Tumor extends into the brachial plexus along the course of the RIGHT T1 and T2 nerves. Surgical consultation is warranted.  No other areas of visualized thoracic vertebral body tumor or epidural tumor. Advanced degenerative CHANGE AT T8-9 with end plate reactive changes. T2 hyperintense paravertebral cyst on the RIGHT at T2-3 dorsally, non worrisome.  There are T2 hyperintense lesions in the liver incompletely evaluated, which could represent metastatic disease. CT abdomen and pelvis recommended. No definite pleural effusion.  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 Surgical consultation is warranted.  63 x 65 x 95 mm RIGHT chest paravertebral mass likely compressing the superior vena cava and surrounding the RIGHT-sided bronchi. 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 Possible Pancoast  tumor. Brachial plexus involvement on the RIGHT. Possible liver metastases. CT of the chest, abdomen, and pelvis with contrast recommended for further evaluation.   Electronically Signed   By: Rolla Flatten M.D.   On: 12/13/2013 20:16   Mr Thoracic Spine W Wo Contrast  12/13/2013   CLINICAL DATA:  Severe neck pain and back pain for the past month, worse in the past 2 weeks. BILATERAL hand tingling. Some lower extremity symptoms developing today. Initial encounter.  EXAM: MRI CERVICAL AND THORACIC SPINE WITHOUT AND WITH CONTRAST  TECHNIQUE: Multiplanar and multiecho pulse sequences of the cervical spine, to include the craniocervical junction and cervicothoracic  junction, and thoracici spine, were obtained without and with intravenous contrast.  CONTRAST:  108mL MULTIHANCE GADOBENATE DIMEGLUMINE 529 MG/ML IV SOLN  COMPARISON:  None.  FINDINGS: MRI CERVICAL SPINE FINDINGS  The patient was unable to remain motionless for the exam. Small or subtle lesions could be overlooked.  There is mild reversal of the normal cervical lordosis. There is degenerative disc disease with disc space narrowing at C5-6 and C6-7 with accompanying endplate reactive changes. A large Schmorl's node projects inferiorly from C6-C7.  No compressive lesion at C4-5 or above. At C5-6, there is mild stenosis due to a central and rightward protrusion. Mild cord flattening. RIGHT subarticular zone narrowing.  At C6-C7, there is moderate central canal stenosis due to a broad-based protrusion. Mild cord flattening. BILATERAL posterior element hypertrophy contributes to spinal stenosis and LEFT greater than RIGHT foraminal narrowing.  Mild annular bulging at C7-T1 with mild facet mediated slip of 2 mm, noncompressive.  RIGHT chest tumor is described in more detail in the thoracic section, but there appears to be RIGHT paravertebral tumor extending cephalad along the course of the brachial plexus, possibly adjacent to the foramen at C6-7 on the RIGHT as  seen on image 28 series 20. Further delineation is hampered due to patient motion.  No worrisome osseous lesions in the cervical spine. Mild pannus surrounds the odontoid. Unremarkable posterior fossa. LEFT vertebral dominant.  MRI THORACIC SPINE FINDINGS  The patient was unable to remain motionless for the exam. Small or subtle lesions could be overlooked.  There is a large RIGHT chest paravertebral mass estimated size of 63 x 65 x 95 mm which likely compresses the superior vena cava, and surrounds the RIGHT-sided proximal bronchi. CT chest with contrast recommended. There is direct invasion of the T1 vertebral body on the RIGHT.  There is a early pathologic fracture T1 with slight anterior wedging. The T1 vertebral body is completely replaced with tumor. There is epidural tumor in the spinal canal ventrally extending equally to the RIGHT and LEFT. Tumor extends into both T1-T2 neural foramina.Mild cord compression is evident. Tumor extends into the brachial plexus along the course of the RIGHT T1 and T2 nerves. Surgical consultation is warranted.  No other areas of visualized thoracic vertebral body tumor or epidural tumor. Advanced degenerative CHANGE AT T8-9 with end plate reactive changes. T2 hyperintense paravertebral cyst on the RIGHT at T2-3 dorsally, non worrisome.  There are T2 hyperintense lesions in the liver incompletely evaluated, which could represent metastatic disease. CT abdomen and pelvis recommended. No definite pleural effusion.  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 Surgical consultation is warranted.  63 x 65 x 95 mm RIGHT chest paravertebral mass likely compressing the superior vena cava and surrounding the RIGHT-sided bronchi. 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 Possible Pancoast tumor. Brachial plexus involvement on the RIGHT. Possible liver metastases. CT of the chest, abdomen, and pelvis with contrast recommended for further evaluation.    Electronically Signed   By: Rolla Flatten M.D.   On: 12/13/2013 20:16      Assessment/Plan:   58 y.o. female with  Active Problems:   1.   Cancer/Lung mass/Epidural mass   Neurosurgery Consulted Dr Vertell Limber saw in ED   Radiation Oncology Dr Tawni Millers in ED   Plans for Biopsy and Cancer Staging    CT scan of chest ABD and Pelvis ordered        2.   SVC syndrome   Radiation Rx to Treat  3.   DVT Prophylaxis   SCDs     Code Status:   FULL CODE    Family Communication:  No Family Present Disposition Plan:   Inpatient     Time spent:  [place] [facility] Pager [phone]   If [place] Please Contact the Day Rounding Team MD for [facility]  If 7PM-7AM, Please Contact Night-Floor Coverage  [email or web address] Password [email or web address] 12/13/2013, 11:15 PM

## 2013-12-13 NOTE — ED Notes (Signed)
Pt presents with ongoing neck pain that radiates down bilateral arms and down her back for the past month.  Pt admits to seeing her PCP on 11/6 and was told she had arthritis- pt has been taking pain medication at home without relief.  Admits that pain has been significantly worse over the past 2 weeks.  Denies injury, denies loss of bowel or bladder, denies numbness or tingling to lower extremities.  Neuro exam negative.

## 2013-12-13 NOTE — ED Provider Notes (Signed)
CSN: [id number]     Arrival date & time 12/13/13  1447 History   First MD Initiated Contact with Patient 12/13/13 1632     Chief Complaint  Patient presents with  . Neck Pain     (Consider location/radiation/quality/duration/timing/severity/associated sxs/prior Treatment) Patient is a 58 y.o. female presenting with neck pain. The history is provided by the patient.  Neck Pain Pain location:  Generalized neck Quality:  Cramping, shooting, stabbing and burning Pain radiates to:  L shoulder, R arm, R hand, L hand, L arm and R shoulder Pain severity:  Severe Pain is:  Worse during the night Onset quality:  Gradual Duration:  1 month Timing:  Constant Progression:  Worsening Chronicity:  New Context comment:  No injury, surgery or other known cause Relieved by:  Nothing Worsened by:  Position Ineffective treatments:  Analgesics, muscle relaxants, ice, heat, neck support and NSAIDs Associated symptoms: numbness, paresis and weakness   Associated symptoms: no bladder incontinence, no bowel incontinence, no fever, no syncope and no weight loss   Associated symptoms comment:  Numbness going down both arms into the 4/5th digit.  Starting yesterday starting to notice some numbness in the calves bilaterally.  No walking difficulty but mild weakness in the arms Risk factors comment:  History of a thyroidectomy but no radiation   Past Medical History  Diagnosis Date  . Panic attack    Past Surgical History  Procedure Laterality Date  . Thyroidectomy     No family history on file. History  Substance Use Topics  . Smoking status: Former Research scientist (life sciences)  . Smokeless tobacco: Not on file  . Alcohol Use: Yes   OB History    No data available     Review of Systems  Constitutional: Negative for fever and weight loss.  Cardiovascular: Negative for syncope.  Gastrointestinal: Negative for bowel incontinence.  Genitourinary: Negative for bladder incontinence.  Musculoskeletal: Positive for  neck pain.  Neurological: Positive for weakness and numbness.  All other systems reviewed and are negative.     Allergies  Penicillins  Home Medications   Prior to Admission medications   Medication Sig Start Date End Date Taking? Authorizing Provider  ALPRAZolam (XANAX) 0.25 MG tablet Take 0.125 mg by mouth daily as needed. For anxiety    Historical Provider, MD  gabapentin (NEURONTIN) 100 MG capsule Take 100 mg by mouth 3 (three) times daily as needed. For nerve pain/headache    Historical Provider, MD  ibuprofen (ADVIL,MOTRIN) 200 MG tablet Take 400 mg by mouth once.    Historical Provider, MD  levothyroxine (SYNTHROID, LEVOTHROID) 125 MCG tablet Take 125 mcg by mouth daily.    Historical Provider, MD  levothyroxine (SYNTHROID, LEVOTHROID) 137 MCG tablet Take 137 mcg by mouth daily.    Historical Provider, MD  sulfamethoxazole-trimethoprim (BACTRIM DS) 800-160 MG per tablet Take 2 tablets by mouth 2 (two) times daily. 02/04/12   Harden Mo, MD   BP 159/104 mmHg  Pulse 79  Temp(Src) 98.7 F (37.1 C)  Resp 17  Ht 6\' 1"  (1.854 m)  Wt 228 lb (103.42 kg)  BMI 30.09 kg/m2  SpO2 98% Physical Exam  Constitutional: She is oriented to person, place, and time. She appears well-developed and well-nourished. No distress.  Appears uncomfortable  HENT:  Head: Normocephalic and atraumatic.  Mouth/Throat: Oropharynx is clear and moist.  Eyes: Conjunctivae and EOM are normal. Pupils are equal, round, and reactive to light.  Neck: Normal range of motion. Neck supple.  Kyphosis and the  pain is unable to be reproduced with palpation  Cardiovascular: Normal rate, regular rhythm and intact distal pulses.   No murmur heard. Pulmonary/Chest: Effort normal and breath sounds normal. No respiratory distress. She has no wheezes. She has no rales.  Abdominal: Soft. She exhibits no distension. There is no tenderness. There is no rebound and no guarding.  Musculoskeletal: Normal range of motion. She  exhibits no edema or tenderness.       Cervical back: Normal.       Thoracic back: Normal.       Back:  Neurological: She is alert and oriented to person, place, and time.  Decreased sensation in the C8 distribution on both upper extremities. 5 out of 5 muscle strength in the upper and lower extremities. Some decreased sensation in the calf bilaterally  Skin: Skin is warm and dry. No rash noted. No erythema.  Psychiatric: She has a normal mood and affect. Her behavior is normal.  Nursing note and vitals reviewed.   ED Course  Procedures (including critical care time) Labs Review Labs Reviewed  CBC WITH DIFFERENTIAL  COMPREHENSIVE METABOLIC PANEL  I-STAT CHEM 8, ED    Imaging Review Mr Cervical Spine W Wo Contrast  12/13/2013   CLINICAL DATA:  Severe neck pain and back pain for the past month, worse in the past 2 weeks. BILATERAL hand tingling. Some lower extremity symptoms developing today. Initial encounter.  EXAM: MRI CERVICAL AND THORACIC SPINE WITHOUT AND WITH CONTRAST  TECHNIQUE: Multiplanar and multiecho pulse sequences of the cervical spine, to include the craniocervical junction and cervicothoracic junction, and thoracici spine, were obtained without and with intravenous contrast.  CONTRAST:  34mL MULTIHANCE GADOBENATE DIMEGLUMINE 529 MG/ML IV SOLN  COMPARISON:  None.  FINDINGS: MRI CERVICAL SPINE FINDINGS  The patient was unable to remain motionless for the exam. Small or subtle lesions could be overlooked.  There is mild reversal of the normal cervical lordosis. There is degenerative disc disease with disc space narrowing at C5-6 and C6-7 with accompanying endplate reactive changes. A large Schmorl's node projects inferiorly from C6-C7.  No compressive lesion at C4-5 or above. At C5-6, there is mild stenosis due to a central and rightward protrusion. Mild cord flattening. RIGHT subarticular zone narrowing.  At C6-C7, there is moderate central canal stenosis due to a broad-based  protrusion. Mild cord flattening. BILATERAL posterior element hypertrophy contributes to spinal stenosis and LEFT greater than RIGHT foraminal narrowing.  Mild annular bulging at C7-T1 with mild facet mediated slip of 2 mm, noncompressive.  RIGHT chest tumor is described in more detail in the thoracic section, but there appears to be RIGHT paravertebral tumor extending cephalad along the course of the brachial plexus, possibly adjacent to the foramen at C6-7 on the RIGHT as seen on image 28 series 20. Further delineation is hampered due to patient motion.  No worrisome osseous lesions in the cervical spine. Mild pannus surrounds the odontoid. Unremarkable posterior fossa. LEFT vertebral dominant.  MRI THORACIC SPINE FINDINGS  The patient was unable to remain motionless for the exam. Small or subtle lesions could be overlooked.  There is a large RIGHT chest paravertebral mass estimated size of 63 x 65 x 95 mm which likely compresses the superior vena cava, and surrounds the RIGHT-sided proximal bronchi. CT chest with contrast recommended. There is direct invasion of the T1 vertebral body on the RIGHT.  There is a early pathologic fracture T1 with slight anterior wedging. The T1 vertebral body is completely replaced with tumor.  There is epidural tumor in the spinal canal ventrally extending equally to the RIGHT and LEFT. Tumor extends into both T1-T2 neural foramina.Mild cord compression is evident. Tumor extends into the brachial plexus along the course of the RIGHT T1 and T2 nerves. Surgical consultation is warranted.  No other areas of visualized thoracic vertebral body tumor or epidural tumor. Advanced degenerative CHANGE AT T8-9 with end plate reactive changes. T2 hyperintense paravertebral cyst on the RIGHT at T2-3 dorsally, non worrisome.  There are T2 hyperintense lesions in the liver incompletely evaluated, which could represent metastatic disease. CT abdomen and pelvis recommended. No definite pleural  effusion.  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 Surgical consultation is warranted.  63 x 65 x 95 mm RIGHT chest paravertebral mass likely compressing the superior vena cava and surrounding the RIGHT-sided bronchi. 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 Possible Pancoast tumor. Brachial plexus involvement on the RIGHT. Possible liver metastases. CT of the chest, abdomen, and pelvis with contrast recommended for further evaluation.   Electronically Signed   By: Rolla Flatten M.D.   On: 12/13/2013 20:16   Mr Thoracic Spine W Wo Contrast  12/13/2013   CLINICAL DATA:  Severe neck pain and back pain for the past month, worse in the past 2 weeks. BILATERAL hand tingling. Some lower extremity symptoms developing today. Initial encounter.  EXAM: MRI CERVICAL AND THORACIC SPINE WITHOUT AND WITH CONTRAST  TECHNIQUE: Multiplanar and multiecho pulse sequences of the cervical spine, to include the craniocervical junction and cervicothoracic junction, and thoracici spine, were obtained without and with intravenous contrast.  CONTRAST:  33mL MULTIHANCE GADOBENATE DIMEGLUMINE 529 MG/ML IV SOLN  COMPARISON:  None.  FINDINGS: MRI CERVICAL SPINE FINDINGS  The patient was unable to remain motionless for the exam. Small or subtle lesions could be overlooked.  There is mild reversal of the normal cervical lordosis. There is degenerative disc disease with disc space narrowing at C5-6 and C6-7 with accompanying endplate reactive changes. A large Schmorl's node projects inferiorly from C6-C7.  No compressive lesion at C4-5 or above. At C5-6, there is mild stenosis due to a central and rightward protrusion. Mild cord flattening. RIGHT subarticular zone narrowing.  At C6-C7, there is moderate central canal stenosis due to a broad-based protrusion. Mild cord flattening. BILATERAL posterior element hypertrophy contributes to spinal stenosis and LEFT greater than RIGHT foraminal narrowing.   Mild annular bulging at C7-T1 with mild facet mediated slip of 2 mm, noncompressive.  RIGHT chest tumor is described in more detail in the thoracic section, but there appears to be RIGHT paravertebral tumor extending cephalad along the course of the brachial plexus, possibly adjacent to the foramen at C6-7 on the RIGHT as seen on image 28 series 20. Further delineation is hampered due to patient motion.  No worrisome osseous lesions in the cervical spine. Mild pannus surrounds the odontoid. Unremarkable posterior fossa. LEFT vertebral dominant.  MRI THORACIC SPINE FINDINGS  The patient was unable to remain motionless for the exam. Small or subtle lesions could be overlooked.  There is a large RIGHT chest paravertebral mass estimated size of 63 x 65 x 95 mm which likely compresses the superior vena cava, and surrounds the RIGHT-sided proximal bronchi. CT chest with contrast recommended. There is direct invasion of the T1 vertebral body on the RIGHT.  There is a early pathologic fracture T1 with slight anterior wedging. The T1 vertebral body is completely replaced with tumor. There is epidural tumor in the spinal canal  ventrally extending equally to the RIGHT and LEFT. Tumor extends into both T1-T2 neural foramina.Mild cord compression is evident. Tumor extends into the brachial plexus along the course of the RIGHT T1 and T2 nerves. Surgical consultation is warranted.  No other areas of visualized thoracic vertebral body tumor or epidural tumor. Advanced degenerative CHANGE AT T8-9 with end plate reactive changes. T2 hyperintense paravertebral cyst on the RIGHT at T2-3 dorsally, non worrisome.  There are T2 hyperintense lesions in the liver incompletely evaluated, which could represent metastatic disease. CT abdomen and pelvis recommended. No definite pleural effusion.  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 Surgical consultation is warranted.  63 x 65 x  95 mm RIGHT chest paravertebral mass likely compressing the superior vena cava and surrounding the RIGHT-sided bronchi. 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 Possible Pancoast tumor. Brachial plexus involvement on the RIGHT. Possible liver metastases. CT of the chest, abdomen, and pelvis with contrast recommended for further evaluation.   Electronically Signed   By: Rolla Flatten M.D.   On: 12/13/2013 20:16     EKG Interpretation None      MDM   Final diagnoses:  Numbness and tingling of both legs below knees  Mass  Mass of lung  Pathologic compression fracture of spine, initial encounter  Mass in epidural space    Patient with 1 month of worsening C and T-spine pain causing pain to radiate down both arms with numbness in the C8/T1 distribution. However in the last few days she's now started to feel tingling and mild weakness in the back of her legs. She denies any gait difficulty or falls. No fever, IV drug use, recent hospitalizations, surgeries in the past. Patient has tried steroids, Flexeril, Robaxin, oxycodone which have not improved her pain and symptoms are worsening.  It is possible that patient's symptoms are related to arthritis and a pinched C8 nerve however with her new leg symptoms concerning for other possibilities such as lesion versus disc protrusion or other cause for her symptoms.  MRI of the C and T spine pending  8:45 PM MRI revealed a pathologic fracture of the T1 with epidural extension of tumor in cord compression. Appears that patient has a right apical lung mass and also possible liver lesions. CT of the chest and abdomen ordered. Patient's pain is improved after Dilaudid but she still nauseated from the pain medication. Will consult neurosurgery and admit to medicine for further evaluation and treatment  9:24 PM Dr Vertell Limber seeing the pt.  Consulted rad onc and oncology as well  CRITICAL CARE Performed by: Blanchie Dessert Total critical care time: 40 Critical care time was  exclusive of separately billable procedures and treating other patients. Critical care was necessary to treat or prevent imminent or life-threatening deterioration. Critical care was time spent personally by me on the following activities: development of treatment plan with patient and/or surrogate as well as nursing, discussions with consultants, evaluation of patient's response to treatment, examination of patient, obtaining history from patient or surrogate, ordering and performing treatments and interventions, ordering and review of laboratory studies, ordering and review of radiographic studies, pulse oximetry and re-evaluation of patient's condition.   Blanchie Dessert, MD 12/13/13 2128

## 2013-12-14 ENCOUNTER — Telehealth: Payer: Self-pay | Admitting: *Deleted

## 2013-12-14 ENCOUNTER — Inpatient Hospital Stay (HOSPITAL_COMMUNITY): Payer: No Typology Code available for payment source

## 2013-12-14 ENCOUNTER — Encounter (HOSPITAL_COMMUNITY): Payer: Self-pay | Admitting: Radiology

## 2013-12-14 ENCOUNTER — Ambulatory Visit: Payer: No Typology Code available for payment source | Admitting: Radiation Oncology

## 2013-12-14 DIAGNOSIS — Z510 Encounter for antineoplastic radiation therapy: Secondary | ICD-10-CM | POA: Insufficient documentation

## 2013-12-14 DIAGNOSIS — C3490 Malignant neoplasm of unspecified part of unspecified bronchus or lung: Secondary | ICD-10-CM

## 2013-12-14 DIAGNOSIS — E890 Postprocedural hypothyroidism: Secondary | ICD-10-CM

## 2013-12-14 DIAGNOSIS — E663 Overweight: Secondary | ICD-10-CM | POA: Diagnosis present

## 2013-12-14 DIAGNOSIS — E039 Hypothyroidism, unspecified: Secondary | ICD-10-CM | POA: Diagnosis present

## 2013-12-14 DIAGNOSIS — C3411 Malignant neoplasm of upper lobe, right bronchus or lung: Secondary | ICD-10-CM | POA: Insufficient documentation

## 2013-12-14 DIAGNOSIS — F411 Generalized anxiety disorder: Secondary | ICD-10-CM | POA: Diagnosis present

## 2013-12-14 HISTORY — DX: Malignant neoplasm of unspecified part of unspecified bronchus or lung: C34.90

## 2013-12-14 LAB — CBC
HEMATOCRIT: 42.1 % (ref 36.0–46.0)
HEMOGLOBIN: 13.7 g/dL (ref 12.0–15.0)
MCH: 30.4 pg (ref 26.0–34.0)
MCHC: 32.5 g/dL (ref 30.0–36.0)
MCV: 93.3 fL (ref 78.0–100.0)
Platelets: 218 10*3/uL (ref 150–400)
RBC: 4.51 MIL/uL (ref 3.87–5.11)
RDW: 12.9 % (ref 11.5–15.5)
WBC: 5.4 10*3/uL (ref 4.0–10.5)

## 2013-12-14 LAB — BASIC METABOLIC PANEL
Anion gap: 12 (ref 5–15)
BUN: 11 mg/dL (ref 6–23)
CO2: 26 meq/L (ref 19–32)
CREATININE: 0.55 mg/dL (ref 0.50–1.10)
Calcium: 9.1 mg/dL (ref 8.4–10.5)
Chloride: 104 mEq/L (ref 96–112)
GFR calc Af Amer: >90 mL/min (ref >90)
GFR calc non Af Amer: >90 mL/min (ref >90)
Glucose, Bld: 155 mg/dL — ABNORMAL HIGH (ref 70–99)
Potassium: 4.3 mEq/L (ref 3.7–5.3)
Sodium: 142 mEq/L (ref 137–147)

## 2013-12-14 LAB — PROTIME-INR
INR: 1.05 (ref 0.00–1.49)
Prothrombin Time: 13.8 seconds (ref 11.6–15.2)

## 2013-12-14 MED ORDER — ALUM & MAG HYDROXIDE-SIMETH 200-200-20 MG/5ML PO SUSP: 30.0000 mL | Freq: Four times a day (QID) | ORAL | Status: DC | PRN

## 2013-12-14 MED ORDER — SODIUM CHLORIDE 0.9 % IV SOLN
INTRAVENOUS | Status: DC
  Administered 2013-12-14 – 2013-12-16 (×6): via INTRAVENOUS

## 2013-12-14 MED ORDER — SODIUM CHLORIDE 0.9 % IV SOLN
INTRAVENOUS | Status: AC | PRN
  Administered 2013-12-14: 10 mL/h via INTRAVENOUS

## 2013-12-14 MED ORDER — ONDANSETRON HCL 4 MG/2ML IJ SOLN
INTRAMUSCULAR | Status: AC
  Administered 2013-12-14: 4 mg via INTRAVENOUS
  Filled 2013-12-14: qty 2

## 2013-12-14 MED ORDER — ALPRAZOLAM 0.25 MG PO TABS
0.1250 mg | ORAL_TABLET | Freq: Every day | ORAL | Status: DC | PRN
  Administered 2013-12-15: 0.125 mg via ORAL
  Filled 2013-12-14: qty 1

## 2013-12-14 MED ORDER — FENTANYL CITRATE 0.05 MG/ML IJ SOLN
INTRAMUSCULAR | Status: AC
  Filled 2013-12-14: qty 2

## 2013-12-14 MED ORDER — FENTANYL CITRATE 0.05 MG/ML IJ SOLN
INTRAMUSCULAR | Status: AC | PRN
  Administered 2013-12-14: 50 ug via INTRAVENOUS
  Administered 2013-12-14 (×2): 25 ug via INTRAVENOUS

## 2013-12-14 MED ORDER — LEVOTHYROXINE SODIUM 125 MCG PO TABS: 125.0000 ug | ORAL_TABLET | Freq: Every day | ORAL | Status: DC

## 2013-12-14 MED ORDER — ACETAMINOPHEN 325 MG PO TABS: 650.0000 mg | ORAL_TABLET | Freq: Four times a day (QID) | ORAL | Status: DC | PRN

## 2013-12-14 MED ORDER — ONDANSETRON HCL 4 MG/2ML IJ SOLN
4.0000 mg | Freq: Four times a day (QID) | INTRAMUSCULAR | Status: DC | PRN
  Administered 2013-12-14 – 2013-12-16 (×4): 4 mg via INTRAVENOUS
  Filled 2013-12-14 (×3): qty 2

## 2013-12-14 MED ORDER — MIDAZOLAM HCL 2 MG/2ML IJ SOLN
INTRAMUSCULAR | Status: AC
  Filled 2013-12-14: qty 2

## 2013-12-14 MED ORDER — ACETAMINOPHEN 650 MG RE SUPP: 650.0000 mg | Freq: Four times a day (QID) | RECTAL | Status: DC | PRN

## 2013-12-14 MED ORDER — MIDAZOLAM HCL 2 MG/2ML IJ SOLN
INTRAMUSCULAR | Status: AC | PRN
  Administered 2013-12-14 (×2): 0.5 mg via INTRAVENOUS
  Administered 2013-12-14: 1 mg via INTRAVENOUS

## 2013-12-14 MED ORDER — LEVOTHYROXINE SODIUM 150 MCG PO TABS
262.0000 ug | ORAL_TABLET | Freq: Every day | ORAL | Status: DC
  Administered 2013-12-14 – 2013-12-17 (×4): 262 ug via ORAL
  Filled 2013-12-14 (×7): qty 1

## 2013-12-14 MED ORDER — LIDOCAINE HCL 1 % IJ SOLN
INTRAMUSCULAR | Status: AC
  Filled 2013-12-14: qty 20

## 2013-12-14 MED ORDER — LEVOTHYROXINE SODIUM 137 MCG PO TABS: 137.0000 ug | ORAL_TABLET | Freq: Every day | ORAL | Status: DC

## 2013-12-14 MED ORDER — ONDANSETRON HCL 4 MG PO TABS: 4.0000 mg | ORAL_TABLET | Freq: Four times a day (QID) | ORAL | Status: DC | PRN

## 2013-12-14 MED ORDER — HYDROMORPHONE HCL 1 MG/ML IJ SOLN
0.5000 mg | INTRAMUSCULAR | Status: DC | PRN
  Administered 2013-12-14: 1 mg via INTRAVENOUS
  Filled 2013-12-14: qty 1

## 2013-12-14 MED ORDER — LIDOCAINE-EPINEPHRINE 1 %-1:100000 IJ SOLN
INTRAMUSCULAR | Status: AC
  Filled 2013-12-14: qty 1

## 2013-12-14 MED ORDER — OXYCODONE HCL 5 MG PO TABS
5.0000 mg | ORAL_TABLET | ORAL | Status: DC | PRN
  Administered 2013-12-15 (×2): 5 mg via ORAL
  Filled 2013-12-14 (×2): qty 1

## 2013-12-14 MED ORDER — SODIUM CHLORIDE 0.9 % IV SOLN: INTRAVENOUS | Status: AC

## 2013-12-14 NOTE — Sedation Documentation (Signed)
Band aid to right upper back, C/D/I at this time.

## 2013-12-14 NOTE — Progress Notes (Signed)
Received a call from [facility] patient scheduled for CT Stimulation, Dr. Vernell Barrier paged and stated he will check patient and will update me .

## 2013-12-14 NOTE — Sedation Documentation (Signed)
SpO2 100% on room air.

## 2013-12-14 NOTE — Progress Notes (Signed)
PROGRESS NOTE  DEBANHI BLAKER NOB:[id number] DOB: 12/14/1955 DOA: 12/13/2013 PCP: No primary care provider on file.  HPI/Recap of past 76 hours: 58 year old female with past medical history of thyroid cancer, hypothyroidism and previous smoker admitted on 11/15 after coming in for 1 month of worsening neck pain and back pain with associated arm numbness and found to have suspected metastatic destructive lesion of T1 plus large right lung mass concerning for possible compression of SVC and right primary bronchus.  Neurosurgery and radiation oncology consulted and patient admitted to hospitalist service.  Patient seen this morning, tearful and stressed about diagnosis. Denies any shortness of breath, and neck pain manageable.  Assessment/Plan: Principal Problem:   Epidural mass: concerning for metastatic lesion, certainly with spinal cord involvement. Started on Decadron. Seen by neurosurgery, with no plans for neurosurgical intervention at this time. Patient will need radiation therapy with plans to transfer patient to [facility] long hospital following CT biopsy.  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 Following this, patient be transferred to [facility] for initiation of radiation treatments as well as oncology follow-up.    Generalized anxiety disorder: On when necessary benzo   Hypothyroidism: On Synthroid   Overweight (BMI 25.0-29.9): Patient meets criteria with BMI greater than 25   Code Status: full code  Family Communication: patient's husband, but not her son's aware of diagnosis, she declined for me to call them any further and states that she would keep them informed  Disposition Plan: transfer to [facility] following biopsy   Consultants:  Neurosurgery  Interventional radiology  Oncology-will consult once  patient transferred  Radiation oncology  Procedures:  Plan for CT guided biopsy  Antibiotics:  none   Objective: BP 141/65 mmHg  Pulse 72  Temp(Src) 98.3 F (36.8 C) (Oral)  Resp 18  Ht 6\' 1"  (1.854 m)  Wt 102.014 kg (224 lb 14.4 oz)  BMI 29.68 kg/m2  SpO2 96%  Intake/Output Summary (Last 24 hours) at 12/14/13 1425 Last data filed at 12/14/13 1300  Gross per 24 hour  Intake    600 ml  Output      0 ml  Net    600 ml   Filed Weights   12/13/13 1521 12/13/13 2353  Weight: 103.42 kg (228 lb) 102.014 kg (224 lb 14.4 oz)    Exam:   General:  Alert and oriented, understandably upset  Cardiovascular: regular rate and rhythm, S1-S2  Respiratory: clear to auscultation bilaterally  Abdomen: soft, nontender, nondistended, positive bowel sounds  Musculoskeletal: no clubbing or cyanosis, trace edema bilaterally   Data Reviewed: Basic Metabolic Panel:  Recent Labs Lab 12/13/13 2018 12/13/13 2053 12/14/13 0521  NA 139 142 142  K 3.9 3.8 4.3  CL 101 103 104  CO2 26  --  26  GLUCOSE 129* 130* 155*  BUN 10 9 11   CREATININE 0.58 0.60 0.55  CALCIUM 9.2  --  9.1   Liver Function Tests:  Recent Labs Lab 12/13/13 2018  AST 17  ALT 14  ALKPHOS 80  BILITOT 0.3  PROT 7.5  ALBUMIN 3.7   No results for input(s): LIPASE, AMYLASE in the last 168 hours. No results for input(s): AMMONIA in the last 168 hours. CBC:  Recent Labs Lab 12/13/13 2018 12/13/13 2053 12/14/13 0521  WBC 8.0  --  5.4  NEUTROABS 5.0  --   --  HGB 13.9 15.3* 13.7  HCT 42.7 45.0 42.1  MCV 93.0  --  93.3  PLT 203  --  218   Cardiac Enzymes:   No results for input(s): CKTOTAL, CKMB, CKMBINDEX, TROPONINI in the last 168 hours. BNP (last 3 results) No results for input(s): PROBNP in the last 8760 hours. CBG: No results for input(s): GLUCAP in the last 168 hours.  No results found for this or any previous visit (from the past 240 hour(s)).   Studies: Ct Chest W  Contrast  12/13/2013   CLINICAL DATA:  Neck and back pain worsening over 2 weeks. Mass found dead MRI today. History of thyroid cancer with thyroidectomy 10 years ago.  EXAM: CT CHEST, ABDOMEN, AND PELVIS WITH CONTRAST  TECHNIQUE: Multidetector CT imaging of the chest, abdomen and pelvis was performed following the standard protocol during bolus administration of intravenous contrast.  CONTRAST:  176mL OMNIPAQUE IOHEXOL 300 MG/ML  SOLN  COMPARISON:  MRI cervical spine 12/13/2013  FINDINGS: CT CHEST FINDINGS  Large heterogeneous appearing soft tissue mass in the right lung apex extending down to the superior aspect of the right hilum. Medially, the mass extends to the trachea and esophagus. With anterior extension to the right subclavian vein. No associated rib destruction. The mass measures about 7.9 x 6.3 x 7.3 cm. This is most likely primary lesion although metastasis possible. Additional pretracheal lymphadenopathy measuring 2.1 cm diameter, likely metastatic.  Normal heart size. Normal caliber thoracic aorta with calcification. Esophagus is decompressed. Dependent atelectasis in the lung bases. Volume loss and mild infiltration in the right upper lung is probably postobstructive. Mild emphysematous changes in the apices. Indeterminate nodule in the right lung base measuring 5 mm diameter. Additional smaller indeterminate nodules also demonstrated on the right lung.  Destructive bone lesion involving the T1 vertebra with associated soft tissue component.  CT ABDOMEN AND PELVIS FINDINGS  Multiple circumscribed low-attenuation lesions demonstrated throughout the liver, largest measuring 12 mm diameter. Appearance is worrisome for metastatic disease although multiple cyst could also have this appearance. The gallbladder, spleen, pancreas, adrenal glands, kidneys, abdominal aorta, inferior vena cava, and retroperitoneal lymph nodes are unremarkable. Diverticulum of the second portion of the duodenum containing gas  and fluid. Stomach, small bowel, and colon are otherwise unremarkable. Small bowel are decompressed in the colon is stool filled. No free air or free fluid in the abdomen. Small umbilical hernia containing fat.  Pelvis: Uterus and ovaries are not enlarged. Bladder wall is not thickened. No free or loculated pelvic fluid collections. No pelvic mass or lymphadenopathy. Appendix is normal.  Normal alignment of lumbar spine with degenerative changes present. Suggestion of focal lucent bone lesion in the right side of the sacrum. No additional destructive bone lesions are suggested.  IMPRESSION: Large mass in the right lung apex likely representing primary lung carcinoma or large metastasis. Metastases demonstrated in the pretracheal lymph nodes, T1 vertebral body, and probably liver. Additional focal lucent bone lesions suggested in the right sacrum.   Electronically Signed   By: Lucienne Capers M.D.   On: 12/13/2013 23:56   Mr Cervical Spine W Wo Contrast  12/13/2013   CLINICAL DATA:  Severe neck pain and back pain for the past month, worse in the past 2 weeks. BILATERAL hand tingling. Some lower extremity symptoms developing today. Initial encounter.  EXAM: MRI CERVICAL AND THORACIC SPINE WITHOUT AND WITH CONTRAST  TECHNIQUE: Multiplanar and multiecho pulse sequences of the cervical spine, to include the craniocervical junction and cervicothoracic junction, and  thoracici spine, were obtained without and with intravenous contrast.  CONTRAST:  55mL MULTIHANCE GADOBENATE DIMEGLUMINE 529 MG/ML IV SOLN  COMPARISON:  None.  FINDINGS: MRI CERVICAL SPINE FINDINGS  The patient was unable to remain motionless for the exam. Small or subtle lesions could be overlooked.  There is mild reversal of the normal cervical lordosis. There is degenerative disc disease with disc space narrowing at C5-6 and C6-7 with accompanying endplate reactive changes. A large Schmorl's node projects inferiorly from C6-C7.  No compressive lesion at  C4-5 or above. At C5-6, there is mild stenosis due to a central and rightward protrusion. Mild cord flattening. RIGHT subarticular zone narrowing.  At C6-C7, there is moderate central canal stenosis due to a broad-based protrusion. Mild cord flattening. BILATERAL posterior element hypertrophy contributes to spinal stenosis and LEFT greater than RIGHT foraminal narrowing.  Mild annular bulging at C7-T1 with mild facet mediated slip of 2 mm, noncompressive.  RIGHT chest tumor is described in more detail in the thoracic section, but there appears to be RIGHT paravertebral tumor extending cephalad along the course of the brachial plexus, possibly adjacent to the foramen at C6-7 on the RIGHT as seen on image 28 series 20. Further delineation is hampered due to patient motion.  No worrisome osseous lesions in the cervical spine. Mild pannus surrounds the odontoid. Unremarkable posterior fossa. LEFT vertebral dominant.  MRI THORACIC SPINE FINDINGS  The patient was unable to remain motionless for the exam. Small or subtle lesions could be overlooked.  There is a large RIGHT chest paravertebral mass estimated size of 63 x 65 x 95 mm which likely compresses the superior vena cava, and surrounds the RIGHT-sided proximal bronchi. CT chest with contrast recommended. There is direct invasion of the T1 vertebral body on the RIGHT.  There is a early pathologic fracture T1 with slight anterior wedging. The T1 vertebral body is completely replaced with tumor. There is epidural tumor in the spinal canal ventrally extending equally to the RIGHT and LEFT. Tumor extends into both T1-T2 neural foramina.Mild cord compression is evident. Tumor extends into the brachial plexus along the course of the RIGHT T1 and T2 nerves. Surgical consultation is warranted.  No other areas of visualized thoracic vertebral body tumor or epidural tumor. Advanced degenerative CHANGE AT T8-9 with end plate reactive changes. T2 hyperintense paravertebral cyst  on the RIGHT at T2-3 dorsally, non worrisome.  There are T2 hyperintense lesions in the liver incompletely evaluated, which could represent metastatic disease. CT abdomen and pelvis recommended. No definite pleural effusion.  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 Surgical consultation is warranted.  63 x 65 x 95 mm RIGHT chest paravertebral mass likely compressing the superior vena cava and surrounding the RIGHT-sided bronchi. 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 Possible Pancoast tumor. Brachial plexus involvement on the RIGHT. Possible liver metastases. CT of the chest, abdomen, and pelvis with contrast recommended for further evaluation.   Electronically Signed   By: Rolla Flatten M.D.   On: 12/13/2013 20:16   Mr Thoracic Spine W Wo Contrast  12/13/2013   CLINICAL DATA:  Severe neck pain and back pain for the past month, worse in the past 2 weeks. BILATERAL hand tingling. Some lower extremity symptoms developing today. Initial encounter.  EXAM: MRI CERVICAL AND THORACIC SPINE WITHOUT AND WITH CONTRAST  TECHNIQUE: Multiplanar and multiecho pulse sequences of the cervical spine, to include the craniocervical junction and cervicothoracic junction, and thoracici spine, were obtained without and with intravenous  contrast.  CONTRAST:  44mL MULTIHANCE GADOBENATE DIMEGLUMINE 529 MG/ML IV SOLN  COMPARISON:  None.  FINDINGS: MRI CERVICAL SPINE FINDINGS  The patient was unable to remain motionless for the exam. Small or subtle lesions could be overlooked.  There is mild reversal of the normal cervical lordosis. There is degenerative disc disease with disc space narrowing at C5-6 and C6-7 with accompanying endplate reactive changes. A large Schmorl's node projects inferiorly from C6-C7.  No compressive lesion at C4-5 or above. At C5-6, there is mild stenosis due to a central and rightward protrusion. Mild cord flattening. RIGHT subarticular zone narrowing.  At C6-C7,  there is moderate central canal stenosis due to a broad-based protrusion. Mild cord flattening. BILATERAL posterior element hypertrophy contributes to spinal stenosis and LEFT greater than RIGHT foraminal narrowing.  Mild annular bulging at C7-T1 with mild facet mediated slip of 2 mm, noncompressive.  RIGHT chest tumor is described in more detail in the thoracic section, but there appears to be RIGHT paravertebral tumor extending cephalad along the course of the brachial plexus, possibly adjacent to the foramen at C6-7 on the RIGHT as seen on image 28 series 20. Further delineation is hampered due to patient motion.  No worrisome osseous lesions in the cervical spine. Mild pannus surrounds the odontoid. Unremarkable posterior fossa. LEFT vertebral dominant.  MRI THORACIC SPINE FINDINGS  The patient was unable to remain motionless for the exam. Small or subtle lesions could be overlooked.  There is a large RIGHT chest paravertebral mass estimated size of 63 x 65 x 95 mm which likely compresses the superior vena cava, and surrounds the RIGHT-sided proximal bronchi. CT chest with contrast recommended. There is direct invasion of the T1 vertebral body on the RIGHT.  There is a early pathologic fracture T1 with slight anterior wedging. The T1 vertebral body is completely replaced with tumor. There is epidural tumor in the spinal canal ventrally extending equally to the RIGHT and LEFT. Tumor extends into both T1-T2 neural foramina.Mild cord compression is evident. Tumor extends into the brachial plexus along the course of the RIGHT T1 and T2 nerves. Surgical consultation is warranted.  No other areas of visualized thoracic vertebral body tumor or epidural tumor. Advanced degenerative CHANGE AT T8-9 with end plate reactive changes. T2 hyperintense paravertebral cyst on the RIGHT at T2-3 dorsally, non worrisome.  There are T2 hyperintense lesions in the liver incompletely evaluated, which could represent metastatic disease.  CT abdomen and pelvis recommended. No definite pleural effusion.  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 Surgical consultation is warranted.  63 x 65 x 95 mm RIGHT chest paravertebral mass likely compressing the superior vena cava and surrounding the RIGHT-sided bronchi. 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 Possible Pancoast tumor. Brachial plexus involvement on the RIGHT. Possible liver metastases. CT of the chest, abdomen, and pelvis with contrast recommended for further evaluation.   Electronically Signed   By: Rolla Flatten M.D.   On: 12/13/2013 20:16   Ct Abdomen Pelvis W Contrast  12/13/2013   CLINICAL DATA:  Neck and back pain worsening over 2 weeks. Mass found dead MRI today. History of thyroid cancer with thyroidectomy 10 years ago.  EXAM: CT CHEST, ABDOMEN, AND PELVIS WITH CONTRAST  TECHNIQUE: Multidetector CT imaging of the chest, abdomen and pelvis was performed following the standard protocol during bolus administration of intravenous contrast.  CONTRAST:  111mL OMNIPAQUE IOHEXOL 300 MG/ML  SOLN  COMPARISON:  MRI cervical spine 12/13/2013  FINDINGS: CT CHEST  FINDINGS  Large heterogeneous appearing soft tissue mass in the right lung apex extending down to the superior aspect of the right hilum. Medially, the mass extends to the trachea and esophagus. With anterior extension to the right subclavian vein. No associated rib destruction. The mass measures about 7.9 x 6.3 x 7.3 cm. This is most likely primary lesion although metastasis possible. Additional pretracheal lymphadenopathy measuring 2.1 cm diameter, likely metastatic.  Normal heart size. Normal caliber thoracic aorta with calcification. Esophagus is decompressed. Dependent atelectasis in the lung bases. Volume loss and mild infiltration in the right upper lung is probably postobstructive. Mild emphysematous changes in the apices. Indeterminate nodule in the right lung base measuring 5 mm  diameter. Additional smaller indeterminate nodules also demonstrated on the right lung.  Destructive bone lesion involving the T1 vertebra with associated soft tissue component.  CT ABDOMEN AND PELVIS FINDINGS  Multiple circumscribed low-attenuation lesions demonstrated throughout the liver, largest measuring 12 mm diameter. Appearance is worrisome for metastatic disease although multiple cyst could also have this appearance. The gallbladder, spleen, pancreas, adrenal glands, kidneys, abdominal aorta, inferior vena cava, and retroperitoneal lymph nodes are unremarkable. Diverticulum of the second portion of the duodenum containing gas and fluid. Stomach, small bowel, and colon are otherwise unremarkable. Small bowel are decompressed in the colon is stool filled. No free air or free fluid in the abdomen. Small umbilical hernia containing fat.  Pelvis: Uterus and ovaries are not enlarged. Bladder wall is not thickened. No free or loculated pelvic fluid collections. No pelvic mass or lymphadenopathy. Appendix is normal.  Normal alignment of lumbar spine with degenerative changes present. Suggestion of focal lucent bone lesion in the right side of the sacrum. No additional destructive bone lesions are suggested.  IMPRESSION: Large mass in the right lung apex likely representing primary lung carcinoma or large metastasis. Metastases demonstrated in the pretracheal lymph nodes, T1 vertebral body, and probably liver. Additional focal lucent bone lesions suggested in the right sacrum.   Electronically Signed   By: Lucienne Capers M.D.   On: 12/13/2013 23:56    Scheduled Meds: . levothyroxine  262 mcg Oral QAC breakfast    Continuous Infusions: . sodium chloride 75 mL/hr at 12/14/13 1403     Time spent: 25 minutes  [facility] Pager [phone]. If 7PM-7AM, please contact night-coverage at [email or web address], password [facility] 12/14/2013, 2:25 PM  LOS: 1 day

## 2013-12-14 NOTE — Sedation Documentation (Signed)
Patient denies pain and is resting comfortably.  

## 2013-12-14 NOTE — Telephone Encounter (Signed)
Called [facility], again to check on status of patient coming for CT Simulation, spoke with  [facility], she is waiting on Dr. Elijah Birk to see patient to discuss , biopsy not scheduled as yet,she will call back shortly, she hasn't called [facility] as yet until she hears from MD there 11:29 AM

## 2013-12-14 NOTE — Telephone Encounter (Signed)
Patient has spinal cord compression, so, treatment delays should be minimized/avoided.

## 2013-12-14 NOTE — Procedures (Signed)
CT guided core biopsies of right apical lung mass.  3 cores obtained without complication.  See Radiology report.

## 2013-12-14 NOTE — Progress Notes (Signed)
HPI: Jasmine Grant is an 58 y.o. female admitted with findings of large right apical lung mass with local invasion to T1 vertebral body and epidural space, which is symptomatic. Workup and treatment recs under way, IR is requested to obtain tissue biopsy for diagnosis. Chart, PMHx, meds, imaging reviewed. Has been NPO all day.  Past Medical History:  Past Medical History  Diagnosis Date  . Panic attack   . Hx of thyroid cancer     10 years ago    Surgical History:  Past Surgical History  Procedure Laterality Date  . Thyroidectomy      Family History: No family history on file.  Social History:  reports that she has quit smoking. She does not have any smokeless tobacco history on file. She reports that she drinks alcohol. She reports that she does not use illicit drugs.  Allergies:  Allergies  Allergen Reactions  . Penicillins     Unknown reaction as a child    Medications: Current facility-administered medications: 0.9 %  sodium chloride infusion, , Intravenous, Continuous, Theressa Millard, MD, Last Rate: 75 mL/hr at 12/14/13 0020;  acetaminophen (TYLENOL) tablet 650 mg, 650 mg, Oral, Q6H PRN **OR** acetaminophen (TYLENOL) suppository 650 mg, 650 mg, Rectal, Q6H PRN, Theressa Millard, MD;  ALPRAZolam Duanne Moron) tablet 0.125 mg, 0.125 mg, Oral, Daily PRN, Theressa Millard, MD alum & mag hydroxide-simeth (MAALOX/MYLANTA) 200-200-20 MG/5ML suspension 30 mL, 30 mL, Oral, Q6H PRN, Theressa Millard, MD;  HYDROmorphone (DILAUDID) injection 0.5-1 mg, 0.5-1 mg, Intravenous, Q3H PRN, Theressa Millard, MD, 1 mg at 12/14/13 0023;  levothyroxine (SYNTHROID, LEVOTHROID) tablet 262 mcg, 262 mcg, Oral, QAC breakfast, Theressa Millard, MD, 262 mcg at 12/14/13 0759 ondansetron (ZOFRAN) tablet 4 mg, 4 mg, Oral, Q6H PRN **OR** ondansetron (ZOFRAN) injection 4 mg, 4 mg, Intravenous, Q6H PRN, Theressa Millard, MD;  oxyCODONE (Oxy IR/ROXICODONE) immediate release tablet 5 mg, 5 mg, Oral,  Q4H PRN, Theressa Millard, MD   ROS: See HPI for pertinent findings, otherwise complete 10 system review negative.  Physical Exam: Blood pressure 117/74, pulse 71, temperature 97.8 F (36.6 C), temperature source Oral, resp. rate 16, height _0  (1.854 m), weight 224 lb 14.4 oz (102.014 kg), SpO2 95 %. General: NAD, A&O x 3 ENT: unremarkable Lungs: CTA without w/r/r Heart: Regular   Labs: Results for orders placed or performed during the hospital encounter of 12/13/13 (from the past 48 hour(s))  CBC with Differential     Status: None   Collection Time: 12/13/13  8:18 PM  Result Value Ref Range   WBC 8.0 4.0 - 10.5 K/uL   RBC 4.59 3.87 - 5.11 MIL/uL   Hemoglobin 13.9 12.0 - 15.0 g/dL   HCT 42.7 36.0 - 46.0 %   MCV 93.0 78.0 - 100.0 fL   MCH 30.3 26.0 - 34.0 pg   MCHC 32.6 30.0 - 36.0 g/dL   RDW 12.8 11.5 - 15.5 %   Platelets 203 150 - 400 K/uL   Neutrophils Relative % 63 43 - 77 %   Neutro Abs 5.0 1.7 - 7.7 K/uL   Lymphocytes Relative 29 12 - 46 %   Lymphs Abs 2.3 0.7 - 4.0 K/uL   Monocytes Relative 7 3 - 12 %   Monocytes Absolute 0.5 0.1 - 1.0 K/uL   Eosinophils Relative 1 0 - 5 %   Eosinophils Absolute 0.1 0.0 - 0.7 K/uL   Basophils Relative 0 0 - 1 %  Basophils Absolute 0.0 0.0 - 0.1 K/uL  Comprehensive metabolic panel     Status: Abnormal   Collection Time: 12/13/13  8:18 PM  Result Value Ref Range   Sodium 139 137 - 147 mEq/L   Potassium 3.9 3.7 - 5.3 mEq/L   Chloride 101 96 - 112 mEq/L   CO2 26 19 - 32 mEq/L   Glucose, Bld 129 (H) 70 - 99 mg/dL   BUN 10 6 - 23 mg/dL   Creatinine, Ser 6.12 0.50 - 1.10 mg/dL   Calcium 9.2 8.4 - 43.2 mg/dL   Total Protein 7.5 6.0 - 8.3 g/dL   Albumin 3.7 3.5 - 5.2 g/dL   AST 17 0 - 37 U/L   ALT 14 0 - 35 U/L   Alkaline Phosphatase 80 39 - 117 U/L   Total Bilirubin 0.3 0.3 - 1.2 mg/dL   GFR calc non Af Amer >90 >90 mL/min   GFR calc Af Amer >90 >90 mL/min    Comment: (NOTE) The eGFR has been calculated using the CKD EPI  equation. This calculation has not been validated in all clinical situations. eGFR's persistently <90 mL/min signify possible Chronic Kidney Disease.    Anion gap 12 5 - 15  I-stat chem 8, ed     Status: Abnormal   Collection Time: 12/13/13  8:53 PM  Result Value Ref Range   Sodium 142 137 - 147 mEq/L   Potassium 3.8 3.7 - 5.3 mEq/L   Chloride 103 96 - 112 mEq/L   BUN 9 6 - 23 mg/dL   Creatinine, Ser 7.55 0.50 - 1.10 mg/dL   Glucose, Bld 623 (H) 70 - 99 mg/dL   Calcium, Ion 9.21 5.15 - 1.23 mmol/L   TCO2 25 0 - 100 mmol/L   Hemoglobin 15.3 (H) 12.0 - 15.0 g/dL   HCT 82.6 58.7 - 18.4 %  Basic metabolic panel     Status: Abnormal   Collection Time: 12/14/13  5:21 AM  Result Value Ref Range   Sodium 142 137 - 147 mEq/L   Potassium 4.3 3.7 - 5.3 mEq/L   Chloride 104 96 - 112 mEq/L   CO2 26 19 - 32 mEq/L   Glucose, Bld 155 (H) 70 - 99 mg/dL   BUN 11 6 - 23 mg/dL   Creatinine, Ser 1.08 0.50 - 1.10 mg/dL   Calcium 9.1 8.4 - 57.9 mg/dL   GFR calc non Af Amer >90 >90 mL/min   GFR calc Af Amer >90 >90 mL/min    Comment: (NOTE) The eGFR has been calculated using the CKD EPI equation. This calculation has not been validated in all clinical situations. eGFR's persistently <90 mL/min signify possible Chronic Kidney Disease.    Anion gap 12 5 - 15  CBC     Status: None   Collection Time: 12/14/13  5:21 AM  Result Value Ref Range   WBC 5.4 4.0 - 10.5 K/uL   RBC 4.51 3.87 - 5.11 MIL/uL   Hemoglobin 13.7 12.0 - 15.0 g/dL   HCT 07.9 31.0 - 91.4 %   MCV 93.3 78.0 - 100.0 fL   MCH 30.4 26.0 - 34.0 pg   MCHC 32.5 30.0 - 36.0 g/dL   RDW 56.0 27.8 - 29.6 %   Platelets 218 150 - 400 K/uL    PT/INR No results for input(s): LABPROT, INR in the last 72 hours.     Ct Chest W Contrast  12/13/2013   CLINICAL DATA:  Neck and back pain worsening  over 2 weeks. Mass found dead MRI today. History of thyroid cancer with thyroidectomy 10 years ago.  EXAM: CT CHEST, ABDOMEN, AND PELVIS WITH  CONTRAST  TECHNIQUE: Multidetector CT imaging of the chest, abdomen and pelvis was performed following the standard protocol during bolus administration of intravenous contrast.  CONTRAST:  OMNIPAQUE IOHEXOL 300 MG/ML  SOLN  COMPARISON:  MRI cervical spine 12/13/2013  FINDINGS: CT CHEST FINDINGS  Large heterogeneous appearing soft tissue mass in the right lung apex extending down to the superior aspect of the right hilum. Medially, the mass extends to the trachea and esophagus. With anterior extension to the right subclavian vein. No associated rib destruction. The mass measures about 7.9 x 6.3 x 7.3 cm. This is most likely primary lesion although metastasis possible. Additional pretracheal lymphadenopathy measuring 2.1 cm diameter, likely metastatic.  Normal heart size. Normal caliber thoracic aorta with calcification. Esophagus is decompressed. Dependent atelectasis in the lung bases. Volume loss and mild infiltration in the right upper lung is probably postobstructive. Mild emphysematous changes in the apices. Indeterminate nodule in the right lung base measuring 5 mm diameter. Additional smaller indeterminate nodules also demonstrated on the right lung.  Destructive bone lesion involving the T1 vertebra with associated soft tissue component.  CT ABDOMEN AND PELVIS FINDINGS  Multiple circumscribed low-attenuation lesions demonstrated throughout the liver, largest measuring 12 mm diameter. Appearance is worrisome for metastatic disease although multiple cyst could also have this appearance. The gallbladder, spleen, pancreas, adrenal glands, kidneys, abdominal aorta, inferior vena cava, and retroperitoneal lymph nodes are unremarkable. Diverticulum of the second portion of the duodenum containing gas and fluid. Stomach, small bowel, and colon are otherwise unremarkable. Small bowel are decompressed in the colon is stool filled. No free air or free fluid in the abdomen. Small umbilical hernia containing fat.   Pelvis: Uterus and ovaries are not enlarged. Bladder wall is not thickened. No free or loculated pelvic fluid collections. No pelvic mass or lymphadenopathy. Appendix is normal.  Normal alignment of lumbar spine with degenerative changes present. Suggestion of focal lucent bone lesion in the right side of the sacrum. No additional destructive bone lesions are suggested.  IMPRESSION: Large mass in the right lung apex likely representing primary lung carcinoma or large metastasis. Metastases demonstrated in the pretracheal lymph nodes, T1 vertebral body, and probably liver. Additional focal lucent bone lesions suggested in the right sacrum.   Electronically Signed   By: Burman Nieves M.D.   On: 12/13/2013 23:56   Mr Cervical Spine W Wo Contrast  12/13/2013   CLINICAL DATA:  Severe neck pain and back pain for the past month, worse in the past 2 weeks. BILATERAL hand tingling. Some lower extremity symptoms developing today. Initial encounter.  EXAM: MRI CERVICAL AND THORACIC SPINE WITHOUT AND WITH CONTRAST  TECHNIQUE: Multiplanar and multiecho pulse sequences of the cervical spine, to include the craniocervical junction and cervicothoracic junction, and thoracici spine, were obtained without and with intravenous contrast.  CONTRAST:  25mL MULTIHANCE GADOBENATE DIMEGLUMINE 529 MG/ML IV SOLN  COMPARISON:  None.  FINDINGS: MRI CERVICAL SPINE FINDINGS  The patient was unable to remain motionless for the exam. Small or subtle lesions could be overlooked.  There is mild reversal of the normal cervical lordosis. There is degenerative disc disease with disc space narrowing at C5-6 and C6-7 with accompanying endplate reactive changes. A large Schmorl's node projects inferiorly from C6-C7.  No compressive lesion at C4-5 or above. At C5-6, there is mild stenosis due to a  central and rightward protrusion. Mild cord flattening. RIGHT subarticular zone narrowing.  At C6-C7, there is moderate central canal stenosis due to a  broad-based protrusion. Mild cord flattening. BILATERAL posterior element hypertrophy contributes to spinal stenosis and LEFT greater than RIGHT foraminal narrowing.  Mild annular bulging at C7-T1 with mild facet mediated slip of 2 mm, noncompressive.  RIGHT chest tumor is described in more detail in the thoracic section, but there appears to be RIGHT paravertebral tumor extending cephalad along the course of the brachial plexus, possibly adjacent to the foramen at C6-7 on the RIGHT as seen on image 28 series 20. Further delineation is hampered due to patient motion.  No worrisome osseous lesions in the cervical spine. Mild pannus surrounds the odontoid. Unremarkable posterior fossa. LEFT vertebral dominant.  MRI THORACIC SPINE FINDINGS  The patient was unable to remain motionless for the exam. Small or subtle lesions could be overlooked.  There is a large RIGHT chest paravertebral mass estimated size of 63 x 65 x 95 mm which likely compresses the superior vena cava, and surrounds the RIGHT-sided proximal bronchi. CT chest with contrast recommended. There is direct invasion of the T1 vertebral body on the RIGHT.  There is a early pathologic fracture T1 with slight anterior wedging. The T1 vertebral body is completely replaced with tumor. There is epidural tumor in the spinal canal ventrally extending equally to the RIGHT and LEFT. Tumor extends into both T1-T2 neural foramina.Mild cord compression is evident. Tumor extends into the brachial plexus along the course of the RIGHT T1 and T2 nerves. Surgical consultation is warranted.  No other areas of visualized thoracic vertebral body tumor or epidural tumor. Advanced degenerative CHANGE AT T8-9 with end plate reactive changes. T2 hyperintense paravertebral cyst on the RIGHT at T2-3 dorsally, non worrisome.  There are T2 hyperintense lesions in the liver incompletely evaluated, which could represent metastatic disease. CT abdomen and pelvis recommended. No definite  pleural effusion.  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 Surgical consultation is warranted.  63 x 65 x 95 mm RIGHT chest paravertebral mass likely compressing the superior vena cava and surrounding the RIGHT-sided bronchi. 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 Possible Pancoast tumor. Brachial plexus involvement on the RIGHT. Possible liver metastases. CT of the chest, abdomen, and pelvis with contrast recommended for further evaluation.   Electronically Signed   By: Rolla Flatten M.D.   On: 12/13/2013 20:16   Mr Thoracic Spine W Wo Contrast  12/13/2013   CLINICAL DATA:  Severe neck pain and back pain for the past month, worse in the past 2 weeks. BILATERAL hand tingling. Some lower extremity symptoms developing today. Initial encounter.  EXAM: MRI CERVICAL AND THORACIC SPINE WITHOUT AND WITH CONTRAST  TECHNIQUE: Multiplanar and multiecho pulse sequences of the cervical spine, to include the craniocervical junction and cervicothoracic junction, and thoracici spine, were obtained without and with intravenous contrast.  CONTRAST:  13mL MULTIHANCE GADOBENATE DIMEGLUMINE 529 MG/ML IV SOLN  COMPARISON:  None.  FINDINGS: MRI CERVICAL SPINE FINDINGS  The patient was unable to remain motionless for the exam. Small or subtle lesions could be overlooked.  There is mild reversal of the normal cervical lordosis. There is degenerative disc disease with disc space narrowing at C5-6 and C6-7 with accompanying endplate reactive changes. A large Schmorl's node projects inferiorly from C6-C7.  No compressive lesion at C4-5 or above. At C5-6, there is mild stenosis due to a central and rightward protrusion. Mild cord flattening. RIGHT  subarticular zone narrowing.  At C6-C7, there is moderate central canal stenosis due to a broad-based protrusion. Mild cord flattening. BILATERAL posterior element hypertrophy contributes to spinal stenosis and LEFT greater than RIGHT foraminal  narrowing.  Mild annular bulging at C7-T1 with mild facet mediated slip of 2 mm, noncompressive.  RIGHT chest tumor is described in more detail in the thoracic section, but there appears to be RIGHT paravertebral tumor extending cephalad along the course of the brachial plexus, possibly adjacent to the foramen at C6-7 on the RIGHT as seen on image 28 series 20. Further delineation is hampered due to patient motion.  No worrisome osseous lesions in the cervical spine. Mild pannus surrounds the odontoid. Unremarkable posterior fossa. LEFT vertebral dominant.  MRI THORACIC SPINE FINDINGS  The patient was unable to remain motionless for the exam. Small or subtle lesions could be overlooked.  There is a large RIGHT chest paravertebral mass estimated size of 63 x 65 x 95 mm which likely compresses the superior vena cava, and surrounds the RIGHT-sided proximal bronchi. CT chest with contrast recommended. There is direct invasion of the T1 vertebral body on the RIGHT.  There is a early pathologic fracture T1 with slight anterior wedging. The T1 vertebral body is completely replaced with tumor. There is epidural tumor in the spinal canal ventrally extending equally to the RIGHT and LEFT. Tumor extends into both T1-T2 neural foramina.Mild cord compression is evident. Tumor extends into the brachial plexus along the course of the RIGHT T1 and T2 nerves. Surgical consultation is warranted.  No other areas of visualized thoracic vertebral body tumor or epidural tumor. Advanced degenerative CHANGE AT T8-9 with end plate reactive changes. T2 hyperintense paravertebral cyst on the RIGHT at T2-3 dorsally, non worrisome.  There are T2 hyperintense lesions in the liver incompletely evaluated, which could represent metastatic disease. CT abdomen and pelvis recommended. No definite pleural effusion.  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 Surgical consultation is warranted.   63 x 65 x 95 mm RIGHT chest paravertebral mass likely compressing the superior vena cava and surrounding the RIGHT-sided bronchi. 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 Possible Pancoast tumor. Brachial plexus involvement on the RIGHT. Possible liver metastases. CT of the chest, abdomen, and pelvis with contrast recommended for further evaluation.   Electronically Signed   By: Rolla Flatten M.D.   On: 12/13/2013 20:16   Ct Abdomen Pelvis W Contrast  12/13/2013   CLINICAL DATA:  Neck and back pain worsening over 2 weeks. Mass found dead MRI today. History of thyroid cancer with thyroidectomy 10 years ago.  EXAM: CT CHEST, ABDOMEN, AND PELVIS WITH CONTRAST  TECHNIQUE: Multidetector CT imaging of the chest, abdomen and pelvis was performed following the standard protocol during bolus administration of intravenous contrast.  CONTRAST:  151mL OMNIPAQUE IOHEXOL 300 MG/ML  SOLN  COMPARISON:  MRI cervical spine 12/13/2013  FINDINGS: CT CHEST FINDINGS  Large heterogeneous appearing soft tissue mass in the right lung apex extending down to the superior aspect of the right hilum. Medially, the mass extends to the trachea and esophagus. With anterior extension to the right subclavian vein. No associated rib destruction. The mass measures about 7.9 x 6.3 x 7.3 cm. This is most likely primary lesion although metastasis possible. Additional pretracheal lymphadenopathy measuring 2.1 cm diameter, likely metastatic.  Normal heart size. Normal caliber thoracic aorta with calcification. Esophagus is decompressed. Dependent atelectasis in the lung bases. Volume loss and mild infiltration in the right upper lung  is probably postobstructive. Mild emphysematous changes in the apices. Indeterminate nodule in the right lung base measuring 5 mm diameter. Additional smaller indeterminate nodules also demonstrated on the right lung.  Destructive bone lesion involving the T1 vertebra with associated soft tissue component.  CT ABDOMEN AND PELVIS FINDINGS   Multiple circumscribed low-attenuation lesions demonstrated throughout the liver, largest measuring 12 mm diameter. Appearance is worrisome for metastatic disease although multiple cyst could also have this appearance. The gallbladder, spleen, pancreas, adrenal glands, kidneys, abdominal aorta, inferior vena cava, and retroperitoneal lymph nodes are unremarkable. Diverticulum of the second portion of the duodenum containing gas and fluid. Stomach, small bowel, and colon are otherwise unremarkable. Small bowel are decompressed in the colon is stool filled. No free air or free fluid in the abdomen. Small umbilical hernia containing fat.  Pelvis: Uterus and ovaries are not enlarged. Bladder wall is not thickened. No free or loculated pelvic fluid collections. No pelvic mass or lymphadenopathy. Appendix is normal.  Normal alignment of lumbar spine with degenerative changes present. Suggestion of focal lucent bone lesion in the right side of the sacrum. No additional destructive bone lesions are suggested.  IMPRESSION: Large mass in the right lung apex likely representing primary lung carcinoma or large metastasis. Metastases demonstrated in the pretracheal lymph nodes, T1 vertebral body, and probably liver. Additional focal lucent bone lesions suggested in the right sacrum.   Electronically Signed   By: Lucienne Capers M.D.   On: 12/13/2013 23:56    Assessment/Plan: Large right apical lung mass For CT guided biopsy. Explained procedure, risks/complications including PTX/chest tube, hemorrhage, resp failure. Use of sedation, pt report N/V with all types of sedation, will give Zofran Labs reviewed, checking stat INR Consent signed in chart  Ascencion Dike PA-C 12/14/2013, 1:24 PM

## 2013-12-14 NOTE — Telephone Encounter (Signed)
Called patient's nurse, spoke with Zenaida,RN, we had to cancel today's CT Simulation ,patient not able to get here by the time CT closes at 4pm daily, will reschedule tomorrow, I will call tomorrow morning when I find out the time schedule, then they will need to call [facility] again for tomorrow's transportation, [place] gave evrbal understanding 4:20 PM

## 2013-12-14 NOTE — Telephone Encounter (Signed)
Called [facility] [place] to speak with patient's RN Elon Jester, asked if patient biopsy has been scheduled yet this am and what time if so, and informed her we have scheduled for patient to be here at 130pm for CT Simulation and will take  An hour ,and you or your  Charge nurse will need to arrange transportation from [employer] to here if this doesn't interfere  with this time frame.Milderd Meager stated she will call and try to find out when biopsy will be done, and will call me back when she finds out status of this, asked if she could call me back within the hour, she stated she would try her best and  call me at [phone] ,thanked RN, called Aaron Edelman in [place] and gave updated status so far 10:05 AM

## 2013-12-15 ENCOUNTER — Encounter: Payer: Self-pay | Admitting: Radiation Oncology

## 2013-12-15 ENCOUNTER — Telehealth: Payer: Self-pay | Admitting: *Deleted

## 2013-12-15 ENCOUNTER — Ambulatory Visit
Admit: 2013-12-15 | Discharge: 2013-12-15 | Disposition: A | Discharge status: Home / Self Care | Payer: No Typology Code available for payment source | Attending: Radiation Oncology | Admitting: Radiation Oncology

## 2013-12-15 VITALS — BP 155/62 | HR 58 | Temp 97.9°F | Resp 20

## 2013-12-15 DIAGNOSIS — C3411 Malignant neoplasm of upper lobe, right bronchus or lung: Secondary | ICD-10-CM

## 2013-12-15 DIAGNOSIS — R030 Elevated blood-pressure reading, without diagnosis of hypertension: Secondary | ICD-10-CM

## 2013-12-15 DIAGNOSIS — IMO0001 Reserved for inherently not codable concepts without codable children: Secondary | ICD-10-CM | POA: Diagnosis present

## 2013-12-15 DIAGNOSIS — E038 Other specified hypothyroidism: Secondary | ICD-10-CM

## 2013-12-15 MED ORDER — HYDRALAZINE HCL 10 MG PO TABS
10.0000 mg | ORAL_TABLET | Freq: Three times a day (TID) | ORAL | Status: DC
  Filled 2013-12-15 (×10): qty 1

## 2013-12-15 MED ORDER — TRAMADOL HCL 50 MG PO TABS
100.0000 mg | ORAL_TABLET | Freq: Four times a day (QID) | ORAL | Status: DC | PRN
  Administered 2013-12-15: 100 mg via ORAL
  Filled 2013-12-15: qty 2

## 2013-12-15 MED ORDER — LORAZEPAM 1 MG PO TABS
1.0000 mg | ORAL_TABLET | Freq: Once | ORAL | Status: DC
  Administered 2013-12-15 – 2013-12-16 (×2): 1 mg via SUBLINGUAL

## 2013-12-15 MED ORDER — HYDROMORPHONE HCL 1 MG/ML IJ SOLN
0.5000 mg | INTRAMUSCULAR | Status: DC | PRN
  Administered 2013-12-15: 0.5 mg via INTRAVENOUS
  Filled 2013-12-15: qty 1

## 2013-12-15 MED ORDER — LORAZEPAM 1 MG PO TABS: 1.0000 mg | ORAL_TABLET | Freq: Once | ORAL | Status: DC

## 2013-12-15 MED ORDER — HYDROMORPHONE HCL 1 MG/ML IJ SOLN
1.0000 mg | INTRAMUSCULAR | Status: DC | PRN
  Administered 2013-12-15 – 2013-12-16 (×3): 1 mg via INTRAVENOUS
  Filled 2013-12-15 (×3): qty 1

## 2013-12-15 NOTE — Telephone Encounter (Signed)
Called [facility], spoke with RN Elon Jester, asked her to arrange [facility]  Transportation to [place]  And have patient here between 145 a- 2pm for her [place], per [place], verbal understanding but they are putting in fro transfer to [place] [address],thanked RN will keep checking to see if she is transferred before time for her Ct sim 9:23 AM

## 2013-12-15 NOTE — Progress Notes (Signed)
PROGRESS NOTE  Jasmine Grant SWF:[id number] DOB: 09-24-1955 DOA: 12/13/2013 PCP: No primary care provider on file.  HPI/Recap of past 33 hours: 58 year old female with past medical history of thyroid cancer, hypothyroidism and previous smoker admitted on 11/15 after coming in for 1 month of worsening neck pain and back pain with associated arm numbness and found to have suspected metastatic destructive lesion of T1 plus large right lung mass concerning for possible compression of SVC and right primary bronchus.  Neurosurgery and radiation oncology consulted and patient admitted to hospitalist service.  Patient seen by interventional radiology and underwent CT-guided biopsy of right apical lung mass 3 on Monday, 11/16.  Plan is for her to be transferred then to [facility] long hospital to start radiation therapy as well as further evaluation by oncology.  Assessment/Plan: Principal Problem:   Epidural mass: concerning for metastatic lesion, certainly with spinal cord involvement. Started on Decadron. Seen by neurosurgery in emergency room, with no plans for neurosurgical intervention at this time. Patient will need radiation therapy with plans to transfer patient to [facility] long hospital following CT biopsy.  Still with some pain in her neck. OxyContin makes her vomit. 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 Patient to be transferred to [facility] for initiation of radiation treatments as well as oncology follow-up.    Generalized anxiety disorder: On when necessary benzo   Hypothyroidism: On Synthroid   Overweight (BMI 25.0-29.9): Patient meets criteria with BMI greater than 25  Elevated blood pressures: No previous history of hypertension reported. It may be secondary to pain and stress. Checking BNP to ensure no underlying  heart failure we are missing. Have started low-dose hydralazine   Code Status: full code  Family Communication: patient's husband, but not her son's aware of diagnosis, she declined for me to call them any further and states that she would keep them informed  Disposition Plan: transfer to [facility] today   Consultants:  Neurosurgery  Interventional radiology  Oncology-will consult once patient transferred  Radiation oncology  Procedures:  Status post CT-guided biopsy of right apical lung 3 done 11/16  Antibiotics:  none   Objective: BP 171/66 mmHg  Pulse 66  Temp(Src) 97.9 F (36.6 C) (Tympanic)  Resp 18  Ht 6\' 1"  (1.854 m)  Wt 102.014 kg (224 lb 14.4 oz)  BMI 29.68 kg/m2  SpO2 100%  Intake/Output Summary (Last 24 hours) at 12/15/13 1307 Last data filed at 12/15/13 0900  Gross per 24 hour  Intake   1080 ml  Output      0 ml  Net   1080 ml   Filed Weights   12/13/13 1521 12/13/13 2353  Weight: 103.42 kg (228 lb) 102.014 kg (224 lb 14.4 oz)    Exam:   General:  Alert and oriented, no acute distress today  Cardiovascular: regular rate and rhythm, S1-S2  Respiratory: clear to auscultation bilaterally  Abdomen: soft, nontender, nondistended, positive bowel sounds  Musculoskeletal: no clubbing or cyanosis, trace edema bilaterally   Data Reviewed: Basic Metabolic Panel:  Recent Labs Lab 12/13/13 2018 12/13/13 2053 12/14/13 0521  NA 139 142 142  K 3.9 3.8 4.3  CL 101 103 104  CO2 26  --  26  GLUCOSE 129* 130* 155*  BUN 10 9 11   CREATININE 0.58 0.60 0.55  CALCIUM 9.2  --  9.1  Liver Function Tests:  Recent Labs Lab 12/13/13 2018  AST 17  ALT 14  ALKPHOS 80  BILITOT 0.3  PROT 7.5  ALBUMIN 3.7   No results for input(s): LIPASE, AMYLASE in the last 168 hours. No results for input(s): AMMONIA in the last 168 hours. CBC:  Recent Labs Lab 12/13/13 2018 12/13/13 2053 12/14/13 0521  WBC 8.0  --  5.4  NEUTROABS 5.0  --   --   HGB  13.9 15.3* 13.7  HCT 42.7 45.0 42.1  MCV 93.0  --  93.3  PLT 203  --  218   Cardiac Enzymes:   No results for input(s): CKTOTAL, CKMB, CKMBINDEX, TROPONINI in the last 168 hours. BNP (last 3 results) No results for input(s): PROBNP in the last 8760 hours. CBG: No results for input(s): GLUCAP in the last 168 hours.  No results found for this or any previous visit (from the past 240 hour(s)).   Studies: Ct Biopsy  12/14/2013   CLINICAL DATA:  58 year old with a right apical lung mass. Tissue diagnosis is needed.  EXAM: CT GUIDED BIOPSY OF RIGHT UPPER LUNG MASS  Physician: Stephan Minister. Henn, MD  MEDICATIONS: 100 mcg fentanyl, 2 mg Versed. A radiology nurse monitored the patient for moderate sedation.  ANESTHESIA/SEDATION: Sedation time: 20 min  PROCEDURE: The procedure was explained to the patient. The risks and benefits of the procedure were discussed and the patient's questions were addressed. Informed consent was obtained from the patient. The patient was placed prone on CT scan. Images of the upper chest were obtained. The right side of the back was prepped and draped in a sterile fashion. 1% lidocaine was used for local anesthetic. A 17 gauge coaxial needle was directed into the posterior aspect of the right apical mass with CT guidance. Needle position was confirmed within the lesion. Three core biopsies were obtained. Specimens were placed in formalin. 17 gauge needle was removed without complication. Bandage was placed over the puncture site.  FINDINGS: Large mass occupying the medial aspect of the right lung apex. There appears to be extension into the superior mediastinum. Needle position was confirmed within the lesion. No significant pneumothorax following the core biopsies.  COMPLICATIONS: None  IMPRESSION: Successful CT-guided core biopsies of the right upper lung mass.   Electronically Signed   By: Markus Daft M.D.   On: 12/14/2013 21:09   Dg Chest Port 1 View  12/14/2013   CLINICAL DATA:   Status post right lung biopsy  EXAM: PORTABLE CHEST - 1 VIEW  COMPARISON:  12/13/2013  FINDINGS: Cardiac shadow is within normal limits. A right apical lung mass is again identified and stable. Changes consistent with post biopsy hemorrhage are noted. No evidence of pneumothorax is seen. The left lung is clear.  IMPRESSION: No evidence of post biopsy pneumothorax.   Electronically Signed   By: Inez Catalina M.D.   On: 12/14/2013 19:00    Scheduled Meds: . hydrALAZINE  10 mg Oral 3 times per day  . levothyroxine  262 mcg Oral QAC breakfast    Continuous Infusions: . sodium chloride 75 mL/hr at 12/15/13 0432     Time spent: 25 minutes  [facility] Pager [phone]. If 7PM-7AM, please contact night-coverage at [email or web address], password [facility] 12/15/2013, 1:07 PM  LOS: 2 days

## 2013-12-15 NOTE — Progress Notes (Signed)
Report given to Santiago Glad, Therapist, sports at [facility] .

## 2013-12-15 NOTE — Progress Notes (Signed)
Patient arrived early via [place], all her belongings were brought as well, stillno bed placenent as yet, no c/o pain, just c.o nausea,gave cool,moist cloth to forehead, IVNs @ 75cc/hr, right wrist area, intact, no redness at site, patient is aware she may need to go back  If no bed available when CT simulation is completed, Dr.Murray to come and go over details and obtain consent, call bell with in reash on tray next to bed, and T.V. Remote given to patient, turned down lights, patient to use cal bell for any needs, declined blanket 1:32 PM

## 2013-12-15 NOTE — Telephone Encounter (Signed)
Called [facility] spoke with patint's RN Elon Jester, asked if  She has given a report to the 3rd floor  RN at [facility], "No, there isn't a bed yet to give report"< informed her that we are an oupt clinic and after her CT simulation she will have to go back if bed  Placement isn't ready at [facility], we will have her for simulation `1 hour, Elon Jester will let her charge nurse know, they need to call [facility] to get bed and give them report 1:14 PM

## 2013-12-15 NOTE — Progress Notes (Signed)
Patient sent to [facility] for [place] but eventually be transferred to [facility]. Report given to carelink. No specific room at this time.

## 2013-12-15 NOTE — Telephone Encounter (Signed)
Jasmine Grant, asked that patient report be called to [facility] long room 1345,:per" Elon Jester she has already called report to that nurse" at [facility], thanked RN, called [place] asked to speak with RN room 1345 notified patietn did have 1mg  ativan SL 1425 for nausea and anxiety prior to  ct simulation ,spoke with Joelene Millin, RN notified of status and patient being transferred shortly to 13454 3:43 PM

## 2013-12-15 NOTE — H&P (Signed)
Report given to [place], Therapist, sports ([facility])

## 2013-12-15 NOTE — Progress Notes (Addendum)
Patient very nauseated when lying on ct sim tabl;e, consent signed, and verbal order 1 mg ativan sl to be given per Dr.murray, patient gave name and dob as identification , gave 1mg  ativan sl, will satrt Ct simulatin in 10 minutes, patient alert,oriented x3, has nausea bag in hand, no emesis at present 2:18 PM  patient back from ct simulation  Completed, nausea has subsided, anxiety down,patient is calm, IVF's still infusing, intact, offered ginger ale or sprite, &  declined no blanket wanted either, call bell with in ireach, tv remote given to patient, 2:50 PM

## 2013-12-15 NOTE — Telephone Encounter (Signed)
Called bed control, [phone] spoke with Jasmine Grant, still no beds available, 2 are pending discharge, try and call back after ct sim done in 1 hour,  2:16 PM

## 2013-12-16 ENCOUNTER — Telehealth: Payer: Self-pay | Admitting: *Deleted

## 2013-12-16 ENCOUNTER — Ambulatory Visit
Admit: 2013-12-16 | Discharge: 2013-12-16 | Disposition: A | Discharge status: Home / Self Care | Payer: No Typology Code available for payment source | Attending: Radiation Oncology | Admitting: Radiation Oncology

## 2013-12-16 DIAGNOSIS — M542 Cervicalgia: Secondary | ICD-10-CM

## 2013-12-16 DIAGNOSIS — C792 Secondary malignant neoplasm of skin: Secondary | ICD-10-CM

## 2013-12-16 DIAGNOSIS — R208 Other disturbances of skin sensation: Secondary | ICD-10-CM

## 2013-12-16 DIAGNOSIS — R112 Nausea with vomiting, unspecified: Secondary | ICD-10-CM

## 2013-12-16 DIAGNOSIS — Z85850 Personal history of malignant neoplasm of thyroid: Secondary | ICD-10-CM

## 2013-12-16 DIAGNOSIS — C7800 Secondary malignant neoplasm of unspecified lung: Secondary | ICD-10-CM

## 2013-12-16 DIAGNOSIS — M549 Dorsalgia, unspecified: Secondary | ICD-10-CM

## 2013-12-16 DIAGNOSIS — G902 Horner's syndrome: Secondary | ICD-10-CM

## 2013-12-16 DIAGNOSIS — C7951 Secondary malignant neoplasm of bone: Secondary | ICD-10-CM

## 2013-12-16 DIAGNOSIS — C787 Secondary malignant neoplasm of liver and intrahepatic bile duct: Secondary | ICD-10-CM

## 2013-12-16 DIAGNOSIS — C3411 Malignant neoplasm of upper lobe, right bronchus or lung: Secondary | ICD-10-CM

## 2013-12-16 MED ORDER — DEXAMETHASONE SODIUM PHOSPHATE 4 MG/ML IJ SOLN
4.0000 mg | Freq: Four times a day (QID) | INTRAMUSCULAR | Status: DC
  Administered 2013-12-17 (×3): 4 mg via INTRAVENOUS
  Filled 2013-12-16 (×5): qty 1

## 2013-12-16 MED ORDER — LORAZEPAM 1 MG PO TABS: 1.0000 mg | ORAL_TABLET | Freq: Once | ORAL | Status: AC

## 2013-12-16 MED ORDER — ALPRAZOLAM 0.25 MG PO TABS
0.1250 mg | ORAL_TABLET | Freq: Three times a day (TID) | ORAL | Status: DC | PRN
  Administered 2013-12-16 – 2013-12-17 (×3): 0.125 mg via ORAL
  Filled 2013-12-16 (×3): qty 1

## 2013-12-16 MED ORDER — ONDANSETRON 8 MG/NS 50 ML IVPB
8.0000 mg | Freq: Four times a day (QID) | INTRAVENOUS | Status: DC | PRN
  Administered 2013-12-16 (×2): 8 mg via INTRAVENOUS
  Filled 2013-12-16 (×5): qty 8

## 2013-12-16 MED ORDER — LIDOCAINE 5 % EX PTCH
1.0000 | MEDICATED_PATCH | CUTANEOUS | Status: DC
  Administered 2013-12-16 – 2013-12-17 (×2): 1 via TRANSDERMAL
  Filled 2013-12-16 (×2): qty 1

## 2013-12-16 MED ORDER — POLYETHYLENE GLYCOL 3350 17 G PO PACK
17.0000 g | PACK | Freq: Every day | ORAL | Status: DC
  Administered 2013-12-16 – 2013-12-17 (×3): 17 g via ORAL
  Filled 2013-12-16 (×2): qty 1

## 2013-12-16 MED ORDER — OXYCODONE HCL ER 10 MG PO T12A
10.0000 mg | EXTENDED_RELEASE_TABLET | Freq: Two times a day (BID) | ORAL | Status: DC
  Administered 2013-12-16 – 2013-12-17 (×3): 10 mg via ORAL
  Filled 2013-12-16 (×3): qty 1

## 2013-12-16 MED ORDER — PROMETHAZINE HCL 25 MG/ML IJ SOLN: 12.5000 mg | Freq: Four times a day (QID) | INTRAMUSCULAR | Status: DC | PRN

## 2013-12-16 MED ORDER — DEXAMETHASONE SODIUM PHOSPHATE 10 MG/ML IJ SOLN
10.0000 mg | INTRAMUSCULAR | Status: AC
  Administered 2013-12-16: 10 mg via INTRAVENOUS
  Filled 2013-12-16: qty 1

## 2013-12-16 MED ORDER — LORAZEPAM 2 MG/ML IJ SOLN
1.0000 mg | Freq: Every day | INTRAMUSCULAR | Status: DC | PRN
  Administered 2013-12-17 (×2): 1 mg via INTRAVENOUS
  Filled 2013-12-16 (×2): qty 1

## 2013-12-16 NOTE — Care Management Note (Signed)
CARE MANAGEMENT NOTE 12/16/2013  Patient:  Jasmine Grant, Jasmine Grant   Account Number:  [id number]  Date Initiated:  12/16/2013  Documentation initiated by:  Marney Doctor  Subjective/Objective Assessment:   58 yo admitted with epidural mass.  Hx of thyroid CA and anxiety.     Action/Plan:   From home with children   Anticipated DC Date:  12/19/2013   Anticipated DC Plan:  [place]  CM consult      Choice offered to / List presented to:             Status of service:  In process, will continue to follow Medicare Important Message given?   (If response is "NO", the following Medicare IM given date fields will be blank) Date Medicare IM given:   Medicare IM given by:   Date Additional Medicare IM given:   Additional Medicare IM given by:    Discharge Disposition:    Per UR Regulation:  Reviewed for med. necessity/level of care/duration of stay  If discussed at [place] of Stay Meetings, dates discussed:    Comments:  12/16/13 Marney Doctor RN,BSN,NCM Chart reviewed and CM following for DC needs.

## 2013-12-16 NOTE — Telephone Encounter (Signed)
[place], [phone], spoke with Tammy, asked if results from Path was back, "It doesn't have a signature on it as yet", per Dr.mooray requesting reuults so we can treat patient for radiation today,;She said Dr. Avis Epley informed Dr. Arnoldo Morale results was positive for Adenocarcinoma", thanked Tammy, and called [facility], spoke with Amy,RT therapist,  1:45 PM

## 2013-12-16 NOTE — Progress Notes (Signed)
MD, patient has not had BM in 3-4 days.  Could she have something for this please?Jasmine Grant

## 2013-12-16 NOTE — Progress Notes (Signed)
PROGRESS NOTE  Jasmine Grant JJK:[id number] DOB: 08/09/1955 DOA: 12/13/2013 PCP: No primary care provider on file.  HPI/Recap of past 6 hours: 58 year old female with past medical history of thyroid cancer, hypothyroidism and previous smoker admitted on 11/15 after coming in for 1 month of worsening neck pain and back pain with associated arm numbness and found to have suspected metastatic destructive lesion of T1 plus large right lung mass concerning for possible compression of SVC and right primary bronchus.  Neurosurgery and radiation oncology consulted and patient admitted to hospitalist service.  Patient seen by interventional radiology and underwent CT-guided biopsy of right apical lung mass 3 on Monday, 11/16.  Pathology came back positive on afternoon of 11/18 for adenocarcinoma-most likely lung primary. Patient transferred over to [facility] long hospital on 11/17 and underwent radiation simulation followed by first radiation dose given 11/18.  Patient herself feeling rough. Her neck pain without pain medication is constantly at a 10. With pain medication, this does help, however she continues to have significant nausea and vomiting from the narcotics. She also still feels very anxious, especially given the circumstances.  Assessment/Plan: Principal Problem:   Epidural mass: concerning for metastatic lesion, certainly with spinal cord involvement. Started on Decadron. Seen by neurosurgery in emergency room, with no plans for neurosurgical intervention at this time. Patient has started radiation treatment. Still with continued pain. To decrease her overall pain levels plus ability to tolerate intensity dose of narcotics, have added OxyContin 20 mg every 12 hours with continuing immediate release for breakthrough pain.   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 Patient transferred to [facility] for initiation of radiation treatments as well as oncology follow-up, plans for staging    Generalized anxiety disorder: On when necessary benzo   Hypothyroidism: On Synthroid   Overweight (BMI 25.0-29.9): Patient meets criteria with BMI greater than 25  Elevated blood pressures: No previous history of hypertension reported. It may be secondary to pain and stress. Checking BNP to ensure no underlying heart failure we are missing. Have started low-dose hydralazine-blood pressure responding   Code Status: full code  Family Communication: patient's husband, but not her son's aware of diagnosis, she declined for me to call them any further and states that she would keep them informed  Disposition Plan: transfer to [facility] today   Consultants:  Neurosurgery  Interventional radiology  Oncology  Radiation oncology  Procedures:  Status post CT-guided biopsy of right apical lung 3 done 11/16  Status post initial radiation treatment done 11/18  Antibiotics:  none   Objective: BP 143/75 mmHg  Pulse 54  Temp(Src) 98.7 F (37.1 C) (Oral)  Resp 18  Ht 6\' 2"  (1.88 m)  Wt 103.42 kg (228 lb)  BMI 29.26 kg/m2  SpO2 100%  Intake/Output Summary (Last 24 hours) at 12/16/13 1729 Last data filed at 12/16/13 0418  Gross per 24 hour  Intake 3588.75 ml  Output      0 ml  Net 3588.75 ml   Filed Weights   12/13/13 1521 12/13/13 2353 12/15/13 1615  Weight: 103.42 kg (228 lb) 102.014 kg (224 lb 14.4 oz) 103.42 kg (228 lb)    Exam:   General:  Alert and oriented, moderate distress secondary to continued nausea  Cardiovascular: regular rate and rhythm, S1-S2  Respiratory: clear to auscultation bilaterally  Abdomen: soft, nontender, nondistended, positive bowel sounds  Musculoskeletal: no clubbing or cyanosis, trace edema  bilaterally   Data Reviewed: Basic Metabolic Panel:  Recent Labs Lab 12/13/13 2018 12/13/13 2053 12/14/13 0521  NA 139 142 142  K 3.9  3.8 4.3  CL 101 103 104  CO2 26  --  26  GLUCOSE 129* 130* 155*  BUN 10 9 11   CREATININE 0.58 0.60 0.55  CALCIUM 9.2  --  9.1   Liver Function Tests:  Recent Labs Lab 12/13/13 2018  AST 17  ALT 14  ALKPHOS 80  BILITOT 0.3  PROT 7.5  ALBUMIN 3.7   No results for input(s): LIPASE, AMYLASE in the last 168 hours. No results for input(s): AMMONIA in the last 168 hours. CBC:  Recent Labs Lab 12/13/13 2018 12/13/13 2053 12/14/13 0521  WBC 8.0  --  5.4  NEUTROABS 5.0  --   --   HGB 13.9 15.3* 13.7  HCT 42.7 45.0 42.1  MCV 93.0  --  93.3  PLT 203  --  218   Cardiac Enzymes:   No results for input(s): CKTOTAL, CKMB, CKMBINDEX, TROPONINI in the last 168 hours. BNP (last 3 results) No results for input(s): PROBNP in the last 8760 hours. CBG: No results for input(s): GLUCAP in the last 168 hours.  No results found for this or any previous visit (from the past 240 hour(s)).   Studies: No results found.  Scheduled Meds: . hydrALAZINE  10 mg Oral 3 times per day  . levothyroxine  262 mcg Oral QAC breakfast  . lidocaine  1 patch Transdermal Q24H  . OxyCODONE  10 mg Oral Q12H  . polyethylene glycol  17 g Oral Daily    Continuous Infusions: . sodium chloride 75 mL/hr at 12/16/13 1542     Time spent: 25 minutes  [facility] Pager [phone]. If 7PM-7AM, please contact night-coverage at [email or web address], password [facility] 12/16/2013, 5:29 PM  LOS: 3 days

## 2013-12-16 NOTE — Consult Note (Signed)
[place]  [phone]   HEMATOLOGY ONCOLOGY CONSULTATION   Jasmine Grant  DOB: Feb 09, 1955  MR#: [id number]  CSN#: [id number]    Requesting Physician: [facility]  No care team member to display  I have seen the patient, examined her and edited the notes as follows  Reason for consultation:  Metastatic Epidural mass  History of present illness:     Jasmine Grant is A 58 year old woman with a prior history of papillary carcinoma of the left Thyroid, stage T IV N1 MX, diagnosed in July 2001, with cellularity invasion, and focal extra thyroid extension and had prior radiation at that time, now admitted on 12/13/2013 with worsening neck and back pain, for the last month. The patient had been seen at [place] with the same complaints, which at that time X-rays were normal, and she was given pain medication and muscle relaxers to treat the area. As the symptoms did not resolve, in fact complicated by bilateral upper extremity decreased sensation, she presented to the emergency department for further evaluation. She denied any respiratory or cardiac complaints. She denied urinary or fecal incontinence. She denies any gait abnormalities, headaches but complained of right eye droopiness. She denies other focal neurological deficit. The patient had significant smoking history.  MRI of the C-spine and T-spine on 12/13/2013 revealed a T1 compression fracture, with epidural tumor causing cord compression and left chest paravertebral mass compression of the SVC and right-sided bronchi. In addition, liver metastasis were likely.CT of the chest  abdomen and pelvis on 12/13/2013 were remarkable for a large mass in the right lung apex measuring 7.9 x 6.3 x 7.3 cm suspicious for primary lung carcinoma versus large metastasis. In addition, a suspicious small right lung base nodule, pretracheal nodes were noted, as well as the  T1 vertebral body metastases seen priorly, Other  bone lesions at the sacral area and probable liver involvement were noted.  Neurosurgery was consulted the patient on 12/13/2013, recommending Decadron IV, and a CT-guided needle biopsy of the mass, which was performed on 11/6 with results pending. Radiation oncology consulted as well, recommending to begin radiation treatments as soon as possible. Oncology consultation was requested with recommendations.  Past medical history:      Past Medical History  Diagnosis Date  . Panic attack   . Hx of thyroid cancer     10 years ago    Past surgical history:      Past Surgical History  Procedure Laterality Date  . Thyroidectomy      Medications:  Prior to Admission:  Prescriptions prior to admission  Medication Sig Dispense Refill Last Dose  . ALPRAZolam (XANAX) 0.25 MG tablet Take 0.125 mg by mouth daily as needed. For anxiety   Taking  . cyclobenzaprine (FLEXERIL) 10 MG tablet Take 10 mg by mouth daily as needed for muscle spasms.   Taking  . ibuprofen (ADVIL,MOTRIN) 200 MG tablet Take 400 mg by mouth once.   Taking  . levothyroxine (SYNTHROID, LEVOTHROID) 125 MCG tablet Take 125 mcg by mouth daily.   Taking  . levothyroxine (SYNTHROID, LEVOTHROID) 137 MCG tablet Take 137 mcg by mouth daily.   Taking  . meloxicam (MOBIC) 15 MG tablet Take 15 mg by mouth daily.   Taking  . methocarbamol (ROBAXIN) 500 MG tablet Take 500 mg by mouth 2 (two) times daily.   Taking  . oxyCODONE-acetaminophen (PERCOCET/ROXICET) 5-325 MG per tablet Take 1 tablet by mouth every 6 (six) hours as needed for  severe pain.   Taking  . sulfamethoxazole-trimethoprim (BACTRIM DS) 800-160 MG per tablet Take 2 tablets by mouth 2 (two) times daily. (Patient not taking: Reported on 12/13/2013) 40 tablet 0 Not Taking    ZSW:FUXNATFTDDUKG **OR** acetaminophen, ALPRAZolam, alum & mag hydroxide-simeth, HYDROmorphone (DILAUDID) injection, ondansetron (ZOFRAN) IV, ondansetron **OR** [DISCONTINUED] ondansetron (ZOFRAN) IV,  promethazine, traMADol  Allergies:  Allergies  Allergen Reactions  . Penicillins     Unknown reaction as a child    ROS: Constitutional: Denies fevers, chills or abnormal night sweats Eyes: Denies blurriness of vision, double vision or watery eyes Ears, nose, mouth, throat, and face: Denies mucositis or sore throat Respiratory: Denies cough, dyspnea or wheezes Cardiovascular: Denies palpitation, chest discomfort or lower extremity swelling Gastrointestinal: She has significant nausea due to narcotic medication given in the hospital, denies heartburn or change in bowel habits Skin: Denies abnormal skin rashes Lymphatics: Denies new lymphadenopathy or easy bruising Neurological: Remarkable for numbness in the ventral portion of both lower arms, and behind the knees without any other areas of weakness. She denies any gait abnormalities. She denies any bowel or urinary incontinence. No seizure activity. Behavioral/Psych: Mood is stable, no new changes  All other systems were reviewed with the patient and are negative.  Social History The patient is separated, has 2 children in good health. She lives in [facility], [place]. 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 She quit 5 years ago the use of one pack of cigarettes a day for about 15 years. She denies any alcohol or recreational drug use.  Physical Exam    ECOG PERFORMANCE STATUS:1 Symptomatic but completely ambulatory (Restricted in physically strenuous activity but ambulatory and able to carry out work    [employer] Vitals:   12/16/13 0628  BP: 111/70  Pulse:   Temp:   Resp:    Filed Weights   12/13/13 1521 12/13/13 2353 12/15/13 1615  Weight: 228 lb (103.42 kg) 224 lb 14.4 oz (102.014 kg) 228 lb (103.42 kg)    GENERAL:alert, no distress and comfortable SKIN: skin color, texture, turgor are normal, no rashes or significant lesions EYES:Proptosis is noted on the right eye, conjunctiva are pink and non-injected,  sclera clear OROPHARYNX :no exudate, no erythema and lips, buccal mucosa, and tongue normal  NECK: supple, non-tender, without nodularity LYMPH:  no palpable lymphadenopathy in the cervical, axillary or inguinal LUNGS: clear to auscultation and percussion with normal breathing effort HEART: regular rate & rhythm and no murmurs and no lower extremity edema ABDOMEN:abdomen soft, moderately obese non-tender and normal bowel sounds Musculoskeletal:no cyanosis of digits and no clubbing  PSYCH: alert & oriented x 3 with fluent speech NEURO: Remarkable for numbness and tingling in both upper extremities and to some extent to below the knees, with the rest of the neuro exam normal  Lab results:       CBC  Recent Labs Lab 12/13/13 2018 12/13/13 2053 12/14/13 0521  WBC 8.0  --  5.4  HGB 13.9 15.3* 13.7  HCT 42.7 45.0 42.1  PLT 203  --  218  MCV 93.0  --  93.3  MCH 30.3  --  30.4  MCHC 32.6  --  32.5  RDW 12.8  --  12.9  LYMPHSABS 2.3  --   --   MONOABS 0.5  --   --   EOSABS 0.1  --   --   BASOSABS 0.0  --   --     Anemia panel:  No results for input(s): VITAMINB12,  FOLATE, FERRITIN, TIBC, IRON, RETICCTPCT in the last 72 hours.   Chemistries   Recent Labs Lab 12/13/13 2018 12/13/13 2053 12/14/13 0521  NA 139 142 142  K 3.9 3.8 4.3  CL 101 103 104  CO2 26  --  26  GLUCOSE 129* 130* 155*  BUN 10 9 11   CREATININE 0.58 0.60 0.55  CALCIUM 9.2  --  9.1     Coagulation profile  Recent Labs Lab 12/14/13 1347  INR 1.05    Urine Studies No results for input(s): UHGB, CRYS in the last 72 hours.  Invalid input(s): UACOL, UAPR, USPG, UPH, UTP, UGL, UKET, UBIL, UNIT, UROB, ULEU, UEPI, UWBC, URBC, UBAC, CAST, UCOM, BILUA  Studies:    I have review all imaging studies.   Ct Chest W Contrast  12/13/2013   CLINICAL DATA:  Neck and back pain worsening over 2 weeks. Mass found dead MRI today. History of thyroid cancer with thyroidectomy 10 years ago.  EXAM: CT CHEST,  ABDOMEN, AND PELVIS WITH CONTRAST  TECHNIQUE: Multidetector CT imaging of the chest, abdomen and pelvis was performed following the standard protocol during bolus administration of intravenous contrast.  CONTRAST:  134mL OMNIPAQUE IOHEXOL 300 MG/ML  SOLN  COMPARISON:  MRI cervical spine 12/13/2013  FINDINGS: CT CHEST FINDINGS  Large heterogeneous appearing soft tissue mass in the right lung apex extending down to the superior aspect of the right hilum. Medially, the mass extends to the trachea and esophagus. With anterior extension to the right subclavian vein. No associated rib destruction. The mass measures about 7.9 x 6.3 x 7.3 cm. This is most likely primary lesion although metastasis possible. Additional pretracheal lymphadenopathy measuring 2.1 cm diameter, likely metastatic.  Normal heart size. Normal caliber thoracic aorta with calcification. Esophagus is decompressed. Dependent atelectasis in the lung bases. Volume loss and mild infiltration in the right upper lung is probably postobstructive. Mild emphysematous changes in the apices. Indeterminate nodule in the right lung base measuring 5 mm diameter. Additional smaller indeterminate nodules also demonstrated on the right lung.  Destructive bone lesion involving the T1 vertebra with associated soft tissue component.  CT ABDOMEN AND PELVIS FINDINGS  Multiple circumscribed low-attenuation lesions demonstrated throughout the liver, largest measuring 12 mm diameter. Appearance is worrisome for metastatic disease although multiple cyst could also have this appearance. The gallbladder, spleen, pancreas, adrenal glands, kidneys, abdominal aorta, inferior vena cava, and retroperitoneal lymph nodes are unremarkable. Diverticulum of the second portion of the duodenum containing gas and fluid. Stomach, small bowel, and colon are otherwise unremarkable. Small bowel are decompressed in the colon is stool filled. No free air or free fluid in the abdomen. Small umbilical  hernia containing fat.  Pelvis: Uterus and ovaries are not enlarged. Bladder wall is not thickened. No free or loculated pelvic fluid collections. No pelvic mass or lymphadenopathy. Appendix is normal.  Normal alignment of lumbar spine with degenerative changes present. Suggestion of focal lucent bone lesion in the right side of the sacrum. No additional destructive bone lesions are suggested.  IMPRESSION: Large mass in the right lung apex likely representing primary lung carcinoma or large metastasis. Metastases demonstrated in the pretracheal lymph nodes, T1 vertebral body, and probably liver. Additional focal lucent bone lesions suggested in the right sacrum.   Electronically Signed   By: Lucienne Capers M.D.   On: 12/13/2013 23:56   Mr Cervical Spine W Wo Contrast  12/13/2013   CLINICAL DATA:  Severe neck pain and back pain for the past month,  worse in the past 2 weeks. BILATERAL hand tingling. Some lower extremity symptoms developing today. Initial encounter.  EXAM: MRI CERVICAL AND THORACIC SPINE WITHOUT AND WITH CONTRAST  TECHNIQUE: Multiplanar and multiecho pulse sequences of the cervical spine, to include the craniocervical junction and cervicothoracic junction, and thoracici spine, were obtained without and with intravenous contrast.  CONTRAST:  58mL MULTIHANCE GADOBENATE DIMEGLUMINE 529 MG/ML IV SOLN  COMPARISON:  None.  FINDINGS: MRI CERVICAL SPINE FINDINGS  The patient was unable to remain motionless for the exam. Small or subtle lesions could be overlooked.  There is mild reversal of the normal cervical lordosis. There is degenerative disc disease with disc space narrowing at C5-6 and C6-7 with accompanying endplate reactive changes. A large Schmorl's node projects inferiorly from C6-C7.  No compressive lesion at C4-5 or above. At C5-6, there is mild stenosis due to a central and rightward protrusion. Mild cord flattening. RIGHT subarticular zone narrowing.  At C6-C7, there is moderate central  canal stenosis due to a broad-based protrusion. Mild cord flattening. BILATERAL posterior element hypertrophy contributes to spinal stenosis and LEFT greater than RIGHT foraminal narrowing.  Mild annular bulging at C7-T1 with mild facet mediated slip of 2 mm, noncompressive.  RIGHT chest tumor is described in more detail in the thoracic section, but there appears to be RIGHT paravertebral tumor extending cephalad along the course of the brachial plexus, possibly adjacent to the foramen at C6-7 on the RIGHT as seen on image 28 series 20. Further delineation is hampered due to patient motion.  No worrisome osseous lesions in the cervical spine. Mild pannus surrounds the odontoid. Unremarkable posterior fossa. LEFT vertebral dominant.  MRI THORACIC SPINE FINDINGS  The patient was unable to remain motionless for the exam. Small or subtle lesions could be overlooked.  There is a large RIGHT chest paravertebral mass estimated size of 63 x 65 x 95 mm which likely compresses the superior vena cava, and surrounds the RIGHT-sided proximal bronchi. CT chest with contrast recommended. There is direct invasion of the T1 vertebral body on the RIGHT.  There is a early pathologic fracture T1 with slight anterior wedging. The T1 vertebral body is completely replaced with tumor. There is epidural tumor in the spinal canal ventrally extending equally to the RIGHT and LEFT. Tumor extends into both T1-T2 neural foramina.Mild cord compression is evident. Tumor extends into the brachial plexus along the course of the RIGHT T1 and T2 nerves. Surgical consultation is warranted.  No other areas of visualized thoracic vertebral body tumor or epidural tumor. Advanced degenerative CHANGE AT T8-9 with end plate reactive changes. T2 hyperintense paravertebral cyst on the RIGHT at T2-3 dorsally, non worrisome.  There are T2 hyperintense lesions in the liver incompletely evaluated, which could represent metastatic disease. CT abdomen and pelvis  recommended. No definite pleural effusion.  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 Surgical consultation is warranted.  63 x 65 x 95 mm RIGHT chest paravertebral mass likely compressing the superior vena cava and surrounding the RIGHT-sided bronchi. 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 Possible Pancoast tumor. Brachial plexus involvement on the RIGHT. Possible liver metastases. CT of the chest, abdomen, and pelvis with contrast recommended for further evaluation.   Electronically Signed   By: Rolla Flatten M.D.   On: 12/13/2013 20:16   Mr Thoracic Spine W Wo Contrast  12/13/2013   CLINICAL DATA:  Severe neck pain and back pain for the past month, worse in the past 2 weeks. BILATERAL hand  tingling. Some lower extremity symptoms developing today. Initial encounter.  EXAM: MRI CERVICAL AND THORACIC SPINE WITHOUT AND WITH CONTRAST  TECHNIQUE: Multiplanar and multiecho pulse sequences of the cervical spine, to include the craniocervical junction and cervicothoracic junction, and thoracici spine, were obtained without and with intravenous contrast.  CONTRAST:  22mL MULTIHANCE GADOBENATE DIMEGLUMINE 529 MG/ML IV SOLN  COMPARISON:  None.  FINDINGS: MRI CERVICAL SPINE FINDINGS  The patient was unable to remain motionless for the exam. Small or subtle lesions could be overlooked.  There is mild reversal of the normal cervical lordosis. There is degenerative disc disease with disc space narrowing at C5-6 and C6-7 with accompanying endplate reactive changes. A large Schmorl's node projects inferiorly from C6-C7.  No compressive lesion at C4-5 or above. At C5-6, there is mild stenosis due to a central and rightward protrusion. Mild cord flattening. RIGHT subarticular zone narrowing.  At C6-C7, there is moderate central canal stenosis due to a broad-based protrusion. Mild cord flattening. BILATERAL posterior element hypertrophy contributes to spinal stenosis and LEFT greater  than RIGHT foraminal narrowing.  Mild annular bulging at C7-T1 with mild facet mediated slip of 2 mm, noncompressive.  RIGHT chest tumor is described in more detail in the thoracic section, but there appears to be RIGHT paravertebral tumor extending cephalad along the course of the brachial plexus, possibly adjacent to the foramen at C6-7 on the RIGHT as seen on image 28 series 20. Further delineation is hampered due to patient motion.  No worrisome osseous lesions in the cervical spine. Mild pannus surrounds the odontoid. Unremarkable posterior fossa. LEFT vertebral dominant.  MRI THORACIC SPINE FINDINGS  The patient was unable to remain motionless for the exam. Small or subtle lesions could be overlooked.  There is a large RIGHT chest paravertebral mass estimated size of 63 x 65 x 95 mm which likely compresses the superior vena cava, and surrounds the RIGHT-sided proximal bronchi. CT chest with contrast recommended. There is direct invasion of the T1 vertebral body on the RIGHT.  There is a early pathologic fracture T1 with slight anterior wedging. The T1 vertebral body is completely replaced with tumor. There is epidural tumor in the spinal canal ventrally extending equally to the RIGHT and LEFT. Tumor extends into both T1-T2 neural foramina.Mild cord compression is evident. Tumor extends into the brachial plexus along the course of the RIGHT T1 and T2 nerves. Surgical consultation is warranted.  No other areas of visualized thoracic vertebral body tumor or epidural tumor. Advanced degenerative CHANGE AT T8-9 with end plate reactive changes. T2 hyperintense paravertebral cyst on the RIGHT at T2-3 dorsally, non worrisome.  There are T2 hyperintense lesions in the liver incompletely evaluated, which could represent metastatic disease. CT abdomen and pelvis recommended. No definite pleural effusion.  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 Surgical  consultation is warranted.  63 x 65 x 95 mm RIGHT chest paravertebral mass likely compressing the superior vena cava and surrounding the RIGHT-sided bronchi. 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 Possible Pancoast tumor. Brachial plexus involvement on the RIGHT. Possible liver metastases. CT of the chest, abdomen, and pelvis with contrast recommended for further evaluation.   Electronically Signed   By: Rolla Flatten M.D.   On: 12/13/2013 20:16   Ct Abdomen Pelvis W Contrast  12/13/2013   CLINICAL DATA:  Neck and back pain worsening over 2 weeks. Mass found dead MRI today. History of thyroid cancer with thyroidectomy 10 years ago.  EXAM: CT CHEST, ABDOMEN,  AND PELVIS WITH CONTRAST  TECHNIQUE: Multidetector CT imaging of the chest, abdomen and pelvis was performed following the standard protocol during bolus administration of intravenous contrast.  CONTRAST:  155mL OMNIPAQUE IOHEXOL 300 MG/ML  SOLN  COMPARISON:  MRI cervical spine 12/13/2013  FINDINGS: CT CHEST FINDINGS  Large heterogeneous appearing soft tissue mass in the right lung apex extending down to the superior aspect of the right hilum. Medially, the mass extends to the trachea and esophagus. With anterior extension to the right subclavian vein. No associated rib destruction. The mass measures about 7.9 x 6.3 x 7.3 cm. This is most likely primary lesion although metastasis possible. Additional pretracheal lymphadenopathy measuring 2.1 cm diameter, likely metastatic.  Normal heart size. Normal caliber thoracic aorta with calcification. Esophagus is decompressed. Dependent atelectasis in the lung bases. Volume loss and mild infiltration in the right upper lung is probably postobstructive. Mild emphysematous changes in the apices. Indeterminate nodule in the right lung base measuring 5 mm diameter. Additional smaller indeterminate nodules also demonstrated on the right lung.  Destructive bone lesion involving the T1 vertebra with associated soft tissue component.  CT  ABDOMEN AND PELVIS FINDINGS  Multiple circumscribed low-attenuation lesions demonstrated throughout the liver, largest measuring 12 mm diameter. Appearance is worrisome for metastatic disease although multiple cyst could also have this appearance. The gallbladder, spleen, pancreas, adrenal glands, kidneys, abdominal aorta, inferior vena cava, and retroperitoneal lymph nodes are unremarkable. Diverticulum of the second portion of the duodenum containing gas and fluid. Stomach, small bowel, and colon are otherwise unremarkable. Small bowel are decompressed in the colon is stool filled. No free air or free fluid in the abdomen. Small umbilical hernia containing fat.  Pelvis: Uterus and ovaries are not enlarged. Bladder wall is not thickened. No free or loculated pelvic fluid collections. No pelvic mass or lymphadenopathy. Appendix is normal.  Normal alignment of lumbar spine with degenerative changes present. Suggestion of focal lucent bone lesion in the right side of the sacrum. No additional destructive bone lesions are suggested.  IMPRESSION: Large mass in the right lung apex likely representing primary lung carcinoma or large metastasis. Metastases demonstrated in the pretracheal lymph nodes, T1 vertebral body, and probably liver. Additional focal lucent bone lesions suggested in the right sacrum.   Electronically Signed   By: Lucienne Capers M.D.   On: 12/13/2013 23:56   Ct Biopsy  12/14/2013   CLINICAL DATA:  58 year old with a right apical lung mass. Tissue diagnosis is needed.  EXAM: CT GUIDED BIOPSY OF RIGHT UPPER LUNG MASS  Physician: Stephan Minister. Henn, MD  MEDICATIONS: 100 mcg fentanyl, 2 mg Versed. A radiology nurse monitored the patient for moderate sedation.  ANESTHESIA/SEDATION: Sedation time: 20 min  PROCEDURE: The procedure was explained to the patient. The risks and benefits of the procedure were discussed and the patient's questions were addressed. Informed consent was obtained from the patient. The  patient was placed prone on CT scan. Images of the upper chest were obtained. The right side of the back was prepped and draped in a sterile fashion. 1% lidocaine was used for local anesthetic. A 17 gauge coaxial needle was directed into the posterior aspect of the right apical mass with CT guidance. Needle position was confirmed within the lesion. Three core biopsies were obtained. Specimens were placed in formalin. 17 gauge needle was removed without complication. Bandage was placed over the puncture site.  FINDINGS: Large mass occupying the medial aspect of the right lung apex. There appears to be extension  into the superior mediastinum. Needle position was confirmed within the lesion. No significant pneumothorax following the core biopsies.  COMPLICATIONS: None  IMPRESSION: Successful CT-guided core biopsies of the right upper lung mass.   Electronically Signed   By: Markus Daft M.D.   On: 12/14/2013 21:09   Dg Chest Port 1 View  12/14/2013   CLINICAL DATA:  Status post right lung biopsy  EXAM: PORTABLE CHEST - 1 VIEW  COMPARISON:  12/13/2013  FINDINGS: Cardiac shadow is within normal limits. A right apical lung mass is again identified and stable. Changes consistent with post biopsy hemorrhage are noted. No evidence of pneumothorax is seen. The left lung is clear.  IMPRESSION: No evidence of post biopsy pneumothorax.   Electronically Signed   By: Inez Catalina M.D.   On: 12/14/2013 19:00   [facility] [place], [place] [id number] [phone]  REPORT OF SURGICAL PATHOLOGY  Case #: [id number] Patient Name: Jasmine Grant, Jasmine Grant PID: [id number] Pathologist: H. Concha Norway, M.D. DOB/Age [birth year] (Age: 45) Gender: F Date Taken: 08/04/99 Date Received: 08/04/99  FINAL DIAGNOSIS  MICROSCOPIC EXAMINATION AND DIAGNOSIS  I. THYROID, LEFT LOBE, LOBECTOMY PAPILLARY CARCINOMAS (TWO) AND HYPERPLASTIC NODULES (TWO).  II. LYMPH NODES, LEFT  PARATRACHEAL (TWO) METASTATIC PAPILLARY THYROID CARCINOMA PRESENT IN TWO OF TWO LYMPH NODES.  III. THYROID, RIGHT, LOBECTOMY OCCULT PAPILLARY CARCINOMA, FOLLICULAR VARIANT AND HYPERPLASTIC NODULES (TWO).  IV. LYMPH NODES (FOUR), PRETRACHEAL BENIGN.  Joretta Bachelor BAIRD MD  COMMENT I,II,III & IV. ONCOLOGY TABLE-THYROID  1. Maximum tumor size (cm): 1.2 cm 2. Margins: Focally involved microscopically. 3. Tumor location: Left upper and lower poles and right inferior lobe. 4. Vascular/Lymphatic invasion: Not definitely identified. 5. Capsular invasion: Yes 6. Extrathyroid extension: Focally 7. Multicentricity: Yes 8. Histology: Papillary carcinoma 9. Lymph nodes: # examined - 6; # positive 2. 10. TNM code: pT4, pN1, pMX 11. Non-neoplastic thyroid: Hyperplastic nodules. 12. Comments: Representative sections of this case were also reviewed by Drs. Brigitte Pulse and Smir who concur with the diagnosis and staging. (HWB:lg, 08/08/99)  lg Date Reported: 08/08/99 H. Concha Norway, M.D.  Electronically Signed Out By [facility]      Assessmnent/Plan:58 y.o. 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 The patient had received radiation at the time for the treatment of her disease dating back to 2001 for the treatment of this cancer CT-guided biopsy of the right apical lung mass was obtained on 12/14/2013 interventional radiology, with results currently pending.The appearance of the lung mass is more consistent with new lung primary. Radiation oncology has evaluated the patient, and to start radiation to the pathologic compression fracture of the spine as soon as possible She is also on IV Decadron as  per admitting team We will await results and proceed with further recommendations pending on the primary disease. She may need more staging information including bone scan and brain MRI. The patient is very distressed today. I will hold off doing any more tests until I have more results back.   I will return tomorrow to discuss further with the patient. If biopsy report is not available by 3:30 PM, I can schedule on the other appointment as an outpatient.  Other medical issues as per  admitting team    Jasmine Grant 12/16/2013  [place], [place], MD 12/16/2013

## 2013-12-16 NOTE — Progress Notes (Signed)
Called the 3rd floor, 1345, spoke with patient's nurse,Kimberly,RN, patient was given 1mg  tablet Ativan sl, couldn't lay on table with mask on face, increased anxiety and nausea, pleas get order for patient to get 1mg  ativan sl daily before rad txs,thanked RN 2:55 PM

## 2013-12-16 NOTE — Progress Notes (Signed)
Patient couldn't lay on table with mask on, got too anxious , requested  Medication, stated she was given 0.5mg  of what she thought was ativan at 1330 pm, hasn't helped, per Dr.Murray, give 1mg  oral ativan x 1 now, patient gave name and dob as identification, witness 1mg  tablet of ativan with Nicholos Johns RN, mediation wouldn't scan 2:47 PM

## 2013-12-17 ENCOUNTER — Ambulatory Visit
Admit: 2013-12-17 | Discharge: 2013-12-17 | Disposition: A | Discharge status: Home / Self Care | Payer: No Typology Code available for payment source | Attending: Radiation Oncology | Admitting: Radiation Oncology

## 2013-12-17 ENCOUNTER — Telehealth: Payer: Self-pay | Admitting: Hematology and Oncology

## 2013-12-17 ENCOUNTER — Inpatient Hospital Stay (HOSPITAL_COMMUNITY): Payer: No Typology Code available for payment source

## 2013-12-17 DIAGNOSIS — T402X5A Adverse effect of other opioids, initial encounter: Secondary | ICD-10-CM | POA: Diagnosis not present

## 2013-12-17 DIAGNOSIS — K5903 Drug induced constipation: Secondary | ICD-10-CM | POA: Diagnosis not present

## 2013-12-17 DIAGNOSIS — C3491 Malignant neoplasm of unspecified part of right bronchus or lung: Secondary | ICD-10-CM

## 2013-12-17 DIAGNOSIS — K5909 Other constipation: Secondary | ICD-10-CM

## 2013-12-17 DIAGNOSIS — E039 Hypothyroidism, unspecified: Secondary | ICD-10-CM

## 2013-12-17 DIAGNOSIS — C3411 Malignant neoplasm of upper lobe, right bronchus or lung: Secondary | ICD-10-CM | POA: Diagnosis present

## 2013-12-17 LAB — PRO B NATRIURETIC PEPTIDE: Pro B Natriuretic peptide (BNP): 415.3 pg/mL — ABNORMAL HIGH (ref 0–125)

## 2013-12-17 MED ORDER — TRAMADOL HCL 50 MG PO TABS: 100.0000 mg | ORAL_TABLET | Freq: Four times a day (QID) | ORAL | Status: DC | PRN

## 2013-12-17 MED ORDER — OXYCODONE HCL ER 10 MG PO T12A: 10.0000 mg | EXTENDED_RELEASE_TABLET | Freq: Two times a day (BID) | ORAL | Status: DC

## 2013-12-17 MED ORDER — LIDOCAINE 5 % EX PTCH: 1.0000 | MEDICATED_PATCH | CUTANEOUS | Status: DC

## 2013-12-17 MED ORDER — GADOBENATE DIMEGLUMINE 529 MG/ML IV SOLN
20.0000 mL | Freq: Once | INTRAVENOUS | Status: AC | PRN
Start: 2013-12-17 — End: 2013-12-17
  Administered 2013-12-17: 20 mL via INTRAVENOUS

## 2013-12-17 MED ORDER — DEXAMETHASONE 4 MG PO TABS: 8.0000 mg | ORAL_TABLET | Freq: Two times a day (BID) | ORAL | Status: DC

## 2013-12-17 MED ORDER — ONDANSETRON HCL 4 MG PO TABS: 4.0000 mg | ORAL_TABLET | Freq: Four times a day (QID) | ORAL | Status: DC | PRN

## 2013-12-17 MED ORDER — LORAZEPAM 2 MG/ML IJ SOLN: 1.0000 mg | Freq: Once | INTRAMUSCULAR | Status: DC | PRN

## 2013-12-17 NOTE — Plan of Care (Signed)
Problem: Phase I Progression Outcomes Goal: Pain controlled with appropriate interventions Outcome: Progressing Goal: OOB as tolerated unless otherwise ordered Outcome: Completed/Met Date Met:  12/17/13 Goal: Initial discharge plan identified Outcome: Completed/Met Date Met:  12/17/13 Goal: Voiding-avoid urinary catheter unless indicated Outcome: Completed/Met Date Met:  12/17/13 Goal: Hemodynamically stable Outcome: Completed/Met Date Met:  12/17/13 Goal: Other Phase I Outcomes/Goals Outcome: Progressing

## 2013-12-17 NOTE — Telephone Encounter (Signed)
patient scheduled to see Dr. Alvy Bimler 12/01 @ 1:30. Welcome packet mailed.

## 2013-12-17 NOTE — Progress Notes (Signed)
[facility] Therapy Treatment Record Phone [phone]   Radiation Therapy was administered to Jasmine Grant on: 12/17/2013  11:29 AM and was treatment # 2 out of a planned course of 5 treatments.

## 2013-12-17 NOTE — Discharge Summary (Signed)
Discharge Summary  Jasmine Grant WLN:[id number] DOB: 1955/06/15  PCP:  Admit date: 12/13/2013 Discharge date: 12/17/2013  Time spent: 35 minutes  Recommendations for Outpatient Follow-up:  1. Patient will follow-up with Dr.Gorsuch, oncology on 12/1-appointment made 2. New medications: OxyContin extended release 10 mg every 12 hours 3. New medications: Ultram 50 mg every 6 hours as needed for breakthrough pain 4. New Medications: Lidocaine 5% patch applied topically to back of neck on for 12 hours/off for 12 hours 5. New medications: Zofran 4 mg every 6 hours as needed for nausea 6. Patient advised to take Miralax daily for another laxative if no bowel movement every 24 hours 7. New medication: Dexamethasone 8 mg by mouth twice a day 8. Patient will follow-up with 3 more sessions scheduled for radiation therapy  Discharge Diagnoses:  Active Hospital Problems   Diagnosis Date Noted  . 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 Constipation due to opioid therapy 12/17/2013  . Nausea with vomiting 12/16/2013  . Elevated blood pressure 12/15/2013  . Generalized anxiety disorder 12/14/2013  . Hypothyroidism 12/14/2013  . Overweight (BMI 25.0-29.9) 12/14/2013  . Lung mass 12/13/2013  . Epidural mass 12/13/2013  . 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    Discharge Condition: Improved, being discharged home  Diet recommendation: Regular  Filed Weights   12/13/13 1521 12/13/13 2353 12/15/13 1615  Weight: 103.42 kg (228 lb) 102.014 kg (224 lb 14.4 oz) 103.42 kg (228 lb)    History of present illness:  58 year old female with past medical history of thyroid cancer, hypothyroidism and previous smoker admitted on 11/15 after coming in for 1 month of worsening neck pain and back pain with associated arm numbness and found to have suspected metastatic destructive lesion of T1 plus large  right lung mass concerning for possible compression of SVC and right primary bronchus. Neurosurgery and radiation oncology consulted and patient admitted to hospitalist service.  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 Pathology came back positive on afternoon of 11/18 for adenocarcinoma-most likely lung primary. Patient transferred over to [facility] on 11/17 and underwent radiation simulation followed by first radiation dose given 11/18.  Patient completed second dose on 11/19 with a total plan 5 doses for now. Oncology consulted and plan is to see patient in the office on 12/1 after patient has completed radiation therapy and at that time further mutation studies should be available. Outpatient PET scan ordered. Patient also underwent CT of abdomen and pelvis on 11/15 noting: Metastases demonstrated in the pretracheal lymph nodes, T1 vertebral body, and probably liver.  Additional focal lucent bone lesions suggested in the right sacrum. MRI of brain done prior to discharge is pending at time of this dictation. It will be updated. Patient has been discharged home if MRI is normal.    Generalized anxiety disorder: Patient will be continued on when necessary Xanax. In addition, she required additional doses before radiation and her MRIs.    Hypothyroidism: Patient continued on Synthroid.    Overweight (BMI 25.0-29.9): Patient meets criteria with BMI greater than 25    Elevated blood pressure: Patient noted  have elevated blood pressure readings. This was felt to be in part due to pain from T1 nerve impingement as well as anxiety and stress from underlying cancer diagnosis: Treated with  oral hydralazine. By day of discharge, patient's blood pressure much better as her pain has been much better control and she has had time to process some of these issues and have a plan in place.    Nausea with vomiting: Patient very sensitive narcotic medications. She required doses of IV Dilaudid or OxyIR, both of which led to significant nausea and vomiting, barely controlled with Phenergan and Zofran. She had much better pain control in the back of her neck using a lidocaine patch as well as extended release oxy ER 10 mg every 12 hours with Ultram for breakthrough pain, which she is being discharged home with    Constipation due to opioid therapy: Patient normally has a bowel movement every day and has not had one for several days, advised her to use Mira lax and goal is to have bowel movement every day with or without laxative help.   Procedures:  Status post CT-guided biopsy of right apical lung 3 done 11/16  Status post radiation treatments done 11/18 and 19  Consultations:  Neurosurgery  Oncology  Interventional radiology  Radiation oncology  Discharge Exam: BP 159/82 mmHg  Pulse 62  Temp(Src) 97.9 F (36.6 C) (Oral)  Resp 16  Ht 6\' 2"  (1.88 m)  Wt 103.42 kg (228 lb)  BMI 29.26 kg/m2  SpO2 95%  General: Alert and oriented 3, no acute distress Cardiovascular: Regular rate and rhythm, S1-S2 Respiratory: Clear to auscultation bilaterally  Discharge Instructions You were cared for by a hospitalist during your hospital stay. If you have any questions about your discharge medications or the care you received while you were in the hospital after you are discharged, you can call the unit and asked to speak with the hospitalist on call if the hospitalist that took care of you is not available. Once you are discharged, your primary care physician will handle any further medical issues. Please note that NO REFILLS for any discharge medications will be authorized once you are  discharged, as it is imperative that you return to your primary care physician (or establish a relationship with a primary care physician if you do not have one) for your aftercare needs so that they can reassess your need for medications and monitor your lab values.     Medication List    STOP taking these medications        oxyCODONE-acetaminophen 5-325 MG per tablet  Commonly known as:  PERCOCET/ROXICET     sulfamethoxazole-trimethoprim 800-160 MG per tablet  Commonly known as:  BACTRIM DS      TAKE these medications        ALPRAZolam 0.25 MG tablet  Commonly known as:  XANAX  Take 0.125 mg by mouth daily as needed. For anxiety     cyclobenzaprine 10 MG tablet  Commonly known as:  FLEXERIL  Take 10 mg by mouth daily as needed for muscle spasms.     ibuprofen 200 MG tablet  Commonly known as:  ADVIL,MOTRIN  Take 400 mg by mouth once.     levothyroxine 137 MCG tablet  Commonly known as:  SYNTHROID, LEVOTHROID  Take 137 mcg by mouth daily.     levothyroxine 125 MCG tablet  Commonly known as:  SYNTHROID, LEVOTHROID  Take 125 mcg by mouth daily.     lidocaine 5 %  Commonly known as:  LIDODERM  Place 1 patch onto the skin daily. Remove & Discard patch within 12 hours or as directed by MD     meloxicam 15 MG tablet  Commonly known as:  MOBIC  Take 15 mg by mouth daily.     methocarbamol 500 MG tablet  Commonly known as:  ROBAXIN  Take 500 mg by mouth 2 (two) times daily.     ondansetron 4 MG tablet  Commonly known as:  ZOFRAN  Take 1 tablet (4 mg total) by mouth every 6 (six) hours as needed for nausea.     OxyCODONE 10 mg T12a 12 hr tablet  Commonly known as:  OXYCONTIN  Take 1 tablet (10 mg total) by mouth every 12 (twelve) hours.     traMADol 50 MG tablet  Commonly known as:  ULTRAM  Take 2 tablets (100 mg total) by mouth every 6 (six) hours as needed for moderate pain.       Allergies  Allergen Reactions  . Penicillins     Unknown reaction as a child        The results of significant diagnostics from this hospitalization (including imaging, microbiology, ancillary and laboratory) are listed below for reference.    Significant Diagnostic Studies: Ct Chest W Contrast  12/13/2013   CLINICAL DATA:  Neck and back pain worsening over 2 weeks. Mass found dead MRI today. History of thyroid cancer with thyroidectomy 10 years ago.  EXAM: CT CHEST, ABDOMEN, AND PELVIS WITH CONTRAST  TECHNIQUE: Multidetector CT imaging of the chest, abdomen and pelvis was performed following the standard protocol during bolus administration of intravenous contrast.  CONTRAST:  156mL OMNIPAQUE IOHEXOL 300 MG/ML  SOLN  COMPARISON:  MRI cervical spine 12/13/2013  FINDINGS: CT CHEST FINDINGS  Large heterogeneous appearing soft tissue mass in the right lung apex extending down to the superior aspect of the right hilum. Medially, the mass extends to the trachea and esophagus. With anterior extension to the right subclavian vein. No associated rib destruction. The mass measures about 7.9 x 6.3 x 7.3 cm. This is most likely primary lesion although metastasis possible. Additional pretracheal lymphadenopathy measuring 2.1 cm diameter, likely metastatic.  Normal heart size. Normal caliber thoracic aorta with calcification. Esophagus is decompressed. Dependent atelectasis in the lung bases. Volume loss and mild infiltration in the right upper lung is probably postobstructive. Mild emphysematous changes in the apices. Indeterminate nodule in the right lung base measuring 5 mm diameter. Additional smaller indeterminate nodules also demonstrated on the right lung.  Destructive bone lesion involving the T1 vertebra with associated soft tissue component.  CT ABDOMEN AND PELVIS FINDINGS  Multiple circumscribed low-attenuation lesions demonstrated throughout the liver, largest measuring 12 mm diameter. Appearance is worrisome for metastatic disease although multiple cyst could also have this  appearance. The gallbladder, spleen, pancreas, adrenal glands, kidneys, abdominal aorta, inferior vena cava, and retroperitoneal lymph nodes are unremarkable. Diverticulum of the second portion of the duodenum containing gas and fluid. Stomach, small bowel, and colon are otherwise unremarkable. Small bowel are decompressed in the colon is stool filled. No free air or free fluid in the abdomen. Small umbilical hernia containing fat.  Pelvis: Uterus and ovaries are not enlarged. Bladder wall is not thickened. No free or loculated pelvic fluid collections. No pelvic mass or lymphadenopathy. Appendix is normal.  Normal alignment of lumbar spine with degenerative changes present. Suggestion of focal lucent bone lesion in the right side of the sacrum. No additional destructive bone lesions  are suggested.  IMPRESSION: Large mass in the right lung apex likely representing primary lung carcinoma or large metastasis. Metastases demonstrated in the pretracheal lymph nodes, T1 vertebral body, and probably liver. Additional focal lucent bone lesions suggested in the right sacrum.   Electronically Signed   By: Lucienne Capers M.D.   On: 12/13/2013 23:56   Mr Cervical Spine W Wo Contrast  12/13/2013   CLINICAL DATA:  Severe neck pain and back pain for the past month, worse in the past 2 weeks. BILATERAL hand tingling. Some lower extremity symptoms developing today. Initial encounter.  EXAM: MRI CERVICAL AND THORACIC SPINE WITHOUT AND WITH CONTRAST  TECHNIQUE: Multiplanar and multiecho pulse sequences of the cervical spine, to include the craniocervical junction and cervicothoracic junction, and thoracici spine, were obtained without and with intravenous contrast.  CONTRAST:  26mL MULTIHANCE GADOBENATE DIMEGLUMINE 529 MG/ML IV SOLN  COMPARISON:  None.  FINDINGS: MRI CERVICAL SPINE FINDINGS  The patient was unable to remain motionless for the exam. Small or subtle lesions could be overlooked.  There is mild reversal of the  normal cervical lordosis. There is degenerative disc disease with disc space narrowing at C5-6 and C6-7 with accompanying endplate reactive changes. A large Schmorl's node projects inferiorly from C6-C7.  No compressive lesion at C4-5 or above. At C5-6, there is mild stenosis due to a central and rightward protrusion. Mild cord flattening. RIGHT subarticular zone narrowing.  At C6-C7, there is moderate central canal stenosis due to a broad-based protrusion. Mild cord flattening. BILATERAL posterior element hypertrophy contributes to spinal stenosis and LEFT greater than RIGHT foraminal narrowing.  Mild annular bulging at C7-T1 with mild facet mediated slip of 2 mm, noncompressive.  RIGHT chest tumor is described in more detail in the thoracic section, but there appears to be RIGHT paravertebral tumor extending cephalad along the course of the brachial plexus, possibly adjacent to the foramen at C6-7 on the RIGHT as seen on image 28 series 20. Further delineation is hampered due to patient motion.  No worrisome osseous lesions in the cervical spine. Mild pannus surrounds the odontoid. Unremarkable posterior fossa. LEFT vertebral dominant.  MRI THORACIC SPINE FINDINGS  The patient was unable to remain motionless for the exam. Small or subtle lesions could be overlooked.  There is a large RIGHT chest paravertebral mass estimated size of 63 x 65 x 95 mm which likely compresses the superior vena cava, and surrounds the RIGHT-sided proximal bronchi. CT chest with contrast recommended. There is direct invasion of the T1 vertebral body on the RIGHT.  There is a early pathologic fracture T1 with slight anterior wedging. The T1 vertebral body is completely replaced with tumor. There is epidural tumor in the spinal canal ventrally extending equally to the RIGHT and LEFT. Tumor extends into both T1-T2 neural foramina.Mild cord compression is evident. Tumor extends into the brachial plexus along the course of the RIGHT T1 and T2  nerves. Surgical consultation is warranted.  No other areas of visualized thoracic vertebral body tumor or epidural tumor. Advanced degenerative CHANGE AT T8-9 with end plate reactive changes. T2 hyperintense paravertebral cyst on the RIGHT at T2-3 dorsally, non worrisome.  There are T2 hyperintense lesions in the liver incompletely evaluated, which could represent metastatic disease. CT abdomen and pelvis recommended. No definite pleural effusion.  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 Surgical consultation is warranted.  63 x 65 x 95 mm RIGHT chest paravertebral mass likely compressing the superior vena  cava and surrounding the RIGHT-sided bronchi. 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 Possible Pancoast tumor. Brachial plexus involvement on the RIGHT. Possible liver metastases. CT of the chest, abdomen, and pelvis with contrast recommended for further evaluation.   Electronically Signed   By: Rolla Flatten M.D.   On: 12/13/2013 20:16   Mr Thoracic Spine W Wo Contrast  12/13/2013   CLINICAL DATA:  Severe neck pain and back pain for the past month, worse in the past 2 weeks. BILATERAL hand tingling. Some lower extremity symptoms developing today. Initial encounter.  EXAM: MRI CERVICAL AND THORACIC SPINE WITHOUT AND WITH CONTRAST  TECHNIQUE: Multiplanar and multiecho pulse sequences of the cervical spine, to include the craniocervical junction and cervicothoracic junction, and thoracici spine, were obtained without and with intravenous contrast.  CONTRAST:  90mL MULTIHANCE GADOBENATE DIMEGLUMINE 529 MG/ML IV SOLN  COMPARISON:  None.  FINDINGS: MRI CERVICAL SPINE FINDINGS  The patient was unable to remain motionless for the exam. Small or subtle lesions could be overlooked.  There is mild reversal of the normal cervical lordosis. There is degenerative disc disease with disc space narrowing at C5-6 and C6-7 with accompanying endplate reactive changes. A large  Schmorl's node projects inferiorly from C6-C7.  No compressive lesion at C4-5 or above. At C5-6, there is mild stenosis due to a central and rightward protrusion. Mild cord flattening. RIGHT subarticular zone narrowing.  At C6-C7, there is moderate central canal stenosis due to a broad-based protrusion. Mild cord flattening. BILATERAL posterior element hypertrophy contributes to spinal stenosis and LEFT greater than RIGHT foraminal narrowing.  Mild annular bulging at C7-T1 with mild facet mediated slip of 2 mm, noncompressive.  RIGHT chest tumor is described in more detail in the thoracic section, but there appears to be RIGHT paravertebral tumor extending cephalad along the course of the brachial plexus, possibly adjacent to the foramen at C6-7 on the RIGHT as seen on image 28 series 20. Further delineation is hampered due to patient motion.  No worrisome osseous lesions in the cervical spine. Mild pannus surrounds the odontoid. Unremarkable posterior fossa. LEFT vertebral dominant.  MRI THORACIC SPINE FINDINGS  The patient was unable to remain motionless for the exam. Small or subtle lesions could be overlooked.  There is a large RIGHT chest paravertebral mass estimated size of 63 x 65 x 95 mm which likely compresses the superior vena cava, and surrounds the RIGHT-sided proximal bronchi. CT chest with contrast recommended. There is direct invasion of the T1 vertebral body on the RIGHT.  There is a early pathologic fracture T1 with slight anterior wedging. The T1 vertebral body is completely replaced with tumor. There is epidural tumor in the spinal canal ventrally extending equally to the RIGHT and LEFT. Tumor extends into both T1-T2 neural foramina.Mild cord compression is evident. Tumor extends into the brachial plexus along the course of the RIGHT T1 and T2 nerves. Surgical consultation is warranted.  No other areas of visualized thoracic vertebral body tumor or epidural tumor. Advanced degenerative CHANGE AT  T8-9 with end plate reactive changes. T2 hyperintense paravertebral cyst on the RIGHT at T2-3 dorsally, non worrisome.  There are T2 hyperintense lesions in the liver incompletely evaluated, which could represent metastatic disease. CT abdomen and pelvis recommended. No definite pleural effusion.  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 Surgical consultation is warranted.  63 x 65 x 95 mm RIGHT chest paravertebral mass likely compressing the superior vena cava and surrounding the RIGHT-sided bronchi. Probable lung  carcinoma. Possible Pancoast tumor. Brachial plexus involvement on the RIGHT. Possible liver metastases. CT of the chest, abdomen, and pelvis with contrast recommended for further evaluation.   Electronically Signed   By: Rolla Flatten M.D.   On: 12/13/2013 20:16   Ct Abdomen Pelvis W Contrast  12/13/2013   CLINICAL DATA:  Neck and back pain worsening over 2 weeks. Mass found dead MRI today. History of thyroid cancer with thyroidectomy 10 years ago.  EXAM: CT CHEST, ABDOMEN, AND PELVIS WITH CONTRAST  TECHNIQUE: Multidetector CT imaging of the chest, abdomen and pelvis was performed following the standard protocol during bolus administration of intravenous contrast.  CONTRAST:  144mL OMNIPAQUE IOHEXOL 300 MG/ML  SOLN  COMPARISON:  MRI cervical spine 12/13/2013  FINDINGS: CT CHEST FINDINGS  Large heterogeneous appearing soft tissue mass in the right lung apex extending down to the superior aspect of the right hilum. Medially, the mass extends to the trachea and esophagus. With anterior extension to the right subclavian vein. No associated rib destruction. The mass measures about 7.9 x 6.3 x 7.3 cm. This is most likely primary lesion although metastasis possible. Additional pretracheal lymphadenopathy measuring 2.1 cm diameter, likely metastatic.  Normal heart size. Normal caliber thoracic aorta with calcification. Esophagus is decompressed.  Dependent atelectasis in the lung bases. Volume loss and mild infiltration in the right upper lung is probably postobstructive. Mild emphysematous changes in the apices. Indeterminate nodule in the right lung base measuring 5 mm diameter. Additional smaller indeterminate nodules also demonstrated on the right lung.  Destructive bone lesion involving the T1 vertebra with associated soft tissue component.  CT ABDOMEN AND PELVIS FINDINGS  Multiple circumscribed low-attenuation lesions demonstrated throughout the liver, largest measuring 12 mm diameter. Appearance is worrisome for metastatic disease although multiple cyst could also have this appearance. The gallbladder, spleen, pancreas, adrenal glands, kidneys, abdominal aorta, inferior vena cava, and retroperitoneal lymph nodes are unremarkable. Diverticulum of the second portion of the duodenum containing gas and fluid. Stomach, small bowel, and colon are otherwise unremarkable. Small bowel are decompressed in the colon is stool filled. No free air or free fluid in the abdomen. Small umbilical hernia containing fat.  Pelvis: Uterus and ovaries are not enlarged. Bladder wall is not thickened. No free or loculated pelvic fluid collections. No pelvic mass or lymphadenopathy. Appendix is normal.  Normal alignment of lumbar spine with degenerative changes present. Suggestion of focal lucent bone lesion in the right side of the sacrum. No additional destructive bone lesions are suggested.  IMPRESSION: Large mass in the right lung apex likely representing primary lung carcinoma or large metastasis. Metastases demonstrated in the pretracheal lymph nodes, T1 vertebral body, and probably liver. Additional focal lucent bone lesions suggested in the right sacrum.   Electronically Signed   By: Lucienne Capers M.D.   On: 12/13/2013 23:56   Ct Biopsy  12/14/2013   CLINICAL DATA:  58 year old with a right apical lung mass. Tissue diagnosis is needed.  EXAM: CT GUIDED BIOPSY OF  RIGHT UPPER LUNG MASS  Physician: Stephan Minister. Henn, MD  MEDICATIONS: 100 mcg fentanyl, 2 mg Versed. A radiology nurse monitored the patient for moderate sedation.  ANESTHESIA/SEDATION: Sedation time: 20 min  PROCEDURE: The procedure was explained to the patient. The risks and benefits of the procedure were discussed and the patient's questions were addressed. Informed consent was obtained from the patient. The patient was placed prone on CT scan. Images of the upper chest were obtained. The right side of the back  was prepped and draped in a sterile fashion. 1% lidocaine was used for local anesthetic. A 17 gauge coaxial needle was directed into the posterior aspect of the right apical mass with CT guidance. Needle position was confirmed within the lesion. Three core biopsies were obtained. Specimens were placed in formalin. 17 gauge needle was removed without complication. Bandage was placed over the puncture site.  FINDINGS: Large mass occupying the medial aspect of the right lung apex. There appears to be extension into the superior mediastinum. Needle position was confirmed within the lesion. No significant pneumothorax following the core biopsies.  COMPLICATIONS: None  IMPRESSION: Successful CT-guided core biopsies of the right upper lung mass.   Electronically Signed   By: Markus Daft M.D.   On: 12/14/2013 21:09   Dg Chest Port 1 View  12/14/2013   CLINICAL DATA:  Status post right lung biopsy  EXAM: PORTABLE CHEST - 1 VIEW  COMPARISON:  12/13/2013  FINDINGS: Cardiac shadow is within normal limits. A right apical lung mass is again identified and stable. Changes consistent with post biopsy hemorrhage are noted. No evidence of pneumothorax is seen. The left lung is clear.  IMPRESSION: No evidence of post biopsy pneumothorax.   Electronically Signed   By: Inez Catalina M.D.   On: 12/14/2013 19:00    Microbiology: No results found for this or any previous visit (from the past 240 hour(s)).   Labs: Basic  Metabolic Panel:  Recent Labs Lab 12/13/13 2018 12/13/13 2053 12/14/13 0521  NA 139 142 142  K 3.9 3.8 4.3  CL 101 103 104  CO2 26  --  26  GLUCOSE 129* 130* 155*  BUN 10 9 11   CREATININE 0.58 0.60 0.55  CALCIUM 9.2  --  9.1   Liver Function Tests:  Recent Labs Lab 12/13/13 2018  AST 17  ALT 14  ALKPHOS 80  BILITOT 0.3  PROT 7.5  ALBUMIN 3.7   No results for input(s): LIPASE, AMYLASE in the last 168 hours. No results for input(s): AMMONIA in the last 168 hours. CBC:  Recent Labs Lab 12/13/13 2018 12/13/13 2053 12/14/13 0521  WBC 8.0  --  5.4  NEUTROABS 5.0  --   --   HGB 13.9 15.3* 13.7  HCT 42.7 45.0 42.1  MCV 93.0  --  93.3  PLT 203  --  218   Cardiac Enzymes: No results for input(s): CKTOTAL, CKMB, CKMBINDEX, TROPONINI in the last 168 hours. BNP: BNP (last 3 results)  Recent Labs  12/17/13 0535  PROBNP 415.3*   CBG: No results for input(s): GLUCAP in the last 168 hours.     Signed:  Annita Brod  [facility] 12/17/2013, 12:49 PM

## 2013-12-17 NOTE — Progress Notes (Signed)
Jasmine Grant   DOB:May 17, 1955   OI#:[id number]   OBS#:[id number]  No care team member to display I have seen the patient, examined her and edited the notes as follows  Subjective: Feeling better this morning pain well controlled with current regimen. Ambulating without assistance. Improving decreased sensation in arms. Denies any nausea or vomiting. Denies any respiratory or cardiac complaints. Denies any headaches or vision changes. No confusion is reported.   Scheduled Meds:  dexamethasone  4 mg Intravenous 4 times per day   hydrALAZINE  10 mg Oral 3 times per day   levothyroxine  262 mcg Oral QAC breakfast   lidocaine  1 patch Transdermal Q24H   OxyCODONE  10 mg Oral Q12H   polyethylene glycol  17 g Oral Daily   Continuous Infusions:  sodium chloride 10 mL/hr at 12/16/13 1728   PRN Meds:acetaminophen **OR** acetaminophen, ALPRAZolam, alum & mag hydroxide-simeth, HYDROmorphone (DILAUDID) injection, LORazepam, ondansetron (ZOFRAN) IV, ondansetron **OR** [DISCONTINUED] ondansetron (ZOFRAN) IV, promethazine, traMADol   Objective:  Filed Vitals:   12/17/13 0613  BP: 159/82  Pulse: 62  Temp: 97.9 F (36.6 C)  Resp: 16      Intake/Output Summary (Last 24 hours) at 12/17/13 0747 Last data filed at 12/17/13 4765  Gross per 24 hour  Intake 1067.5 ml  Output      0 ml  Net 1067.5 ml    ECOG PERFORMANCE STATUS:1  GENERAL:alert, no distress and comfortable SKIN: skin color, texture, turgor are normal, no rashes or significant lesions EYES: Right ptosis is improved. Conjunctiva are pink and non-injected, sclera clear OROPHARYNX :no exudate, no erythema and lips, buccal mucosa, and tongue normal  NECK: supple, non-tender, without nodularity LYMPH: no palpable lymphadenopathy in the cervical, axillary or inguinal LUNGS: clear to auscultation and percussion with normal breathing effort HEART: regular rate & rhythm and no murmurs and no lower extremity edema ABDOMEN:  abdomen soft, moderately obese non-tender and normal bowel sounds Musculoskeletal:no cyanosis of digits and no clubbing  PSYCH: alert & oriented x 3 with fluent speech NEURO:Improved numbness and tingling in both upper extremities, with the rest of the neuro exam normal  CBG (last 3)  No results for input(s): GLUCAP in the last 72 hours.   Labs:   Recent Labs Lab 12/13/13 2018 12/13/13 2053 12/14/13 0521  WBC 8.0  --  5.4  HGB 13.9 15.3* 13.7  HCT 42.7 45.0 42.1  PLT 203  --  218  MCV 93.0  --  93.3  MCH 30.3  --  30.4  MCHC 32.6  --  32.5  RDW 12.8  --  12.9  LYMPHSABS 2.3  --   --   MONOABS 0.5  --   --   EOSABS 0.1  --   --   BASOSABS 0.0  --   --      Chemistries:    Recent Labs Lab 12/13/13 2018 12/13/13 2053 12/14/13 0521  NA 139 142 142  K 3.9 3.8 4.3  CL 101 103 104  CO2 26  --  26  GLUCOSE 129* 130* 155*  BUN 10 9 11   CREATININE 0.58 0.60 0.55  CALCIUM 9.2  --  9.1  AST 17  --   --   ALT 14  --   --   ALKPHOS 80  --   --   BILITOT 0.3  --   --     GFR Estimated Creatinine Clearance: 105.2 mL/min (by C-G formula based on Cr of 0.55).  Liver  Function Tests:  Recent Labs Lab 12/13/13 2018  AST 17  ALT 14  ALKPHOS 80  BILITOT 0.3  PROT 7.5  ALBUMIN 3.7   Coagulation profile  Recent Labs Lab 12/14/13 1347  INR 1.05   Patient: TENNILE, STYLES Collected: 12/14/2013 Client: [place] Accession: [id number] Received: 12/15/2013 Markus Daft DOB: [birth year] Age: 58 Gender: F Reported: 12/16/2013 [address] Patient Ph: [phone] MRN #: [id number] [place], [place] [address] Visit #: [id number].[id number] Chart #: Phone:  Fax: CC: Harvette Lorrin Goodell, MD REPORT OF SURGICAL PATHOLOGY FINAL DIAGNOSIS Diagnosis Lung, needle/core biopsy(ies), right upper lobe, apical - POSITIVE FOR ADENOCARCINOMA. Microscopic Comment The findings are called to Dr. [place] Pink on 12/16/2013. Dr. Nicoletta Dress has seen this case in  consultation with agreement. (RH:kh 12-16-13) Willeen Niece MD Pathologist, Electronic Signature (Case signed 12/16/2013)   Imaging Studies:  No results found.  Assessment/Plan: 58 y.o.  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 CT-guided biopsy of the right apical lung mass was obtained on 12/14/2013 interventional radiology, with results consistent with Adenocarcinoma (likely lung primary). I will get pathology department to add additional mutation study now. Plan would be to complete radiation therapy and I will see her back on 12/29/2013 and hopefully by that we would have additional mutation results. That should guide the course of systemic treatment. Radiation oncology has evaluated the patient, and had started radiation to the pathologic compression fracture of the spine as soon as possible She is also on IV Decadron as per admitting team, okay to initiate tapering dose over the next few weeks. She may need more staging information with brain MRI prior to discharge. I will arrange PET CT scan as an outpatient. I have discussed findings with patient, with recommendations regarding her care She can be discharged later today if MRI showed no evidence of brain metastasis. Appointment is made for her to see me on 12/29/2013. I will sign off. Please call me if questions arise.  Other medical issues as per admitting team  **Disclaimer: This note was dictated with voice recognition software. Similar sounding words can inadvertently be transcribed and this note may contain transcription errors which may not have been corrected upon publication of note.Sharene Butters E, PA-C 12/17/2013  7:47 AM  Azzam Mehra, MD 12/17/2013

## 2013-12-18 ENCOUNTER — Ambulatory Visit
Admit: 2013-12-18 | Discharge: 2013-12-18 | Disposition: A | Discharge status: Home / Self Care | Payer: No Typology Code available for payment source | Attending: Radiation Oncology | Admitting: Radiation Oncology

## 2013-12-18 ENCOUNTER — Telehealth: Payer: Self-pay | Admitting: Radiation Oncology

## 2013-12-18 ENCOUNTER — Encounter: Payer: Self-pay | Admitting: Radiation Oncology

## 2013-12-18 VITALS — BP 155/89 | HR 61 | Temp 97.9°F | Resp 16 | Wt 225.5 lb

## 2013-12-18 DIAGNOSIS — Z510 Encounter for antineoplastic radiation therapy: Secondary | ICD-10-CM | POA: Diagnosis not present

## 2013-12-18 DIAGNOSIS — C3491 Malignant neoplasm of unspecified part of right bronchus or lung: Secondary | ICD-10-CM | POA: Insufficient documentation

## 2013-12-18 DIAGNOSIS — C787 Secondary malignant neoplasm of liver and intrahepatic bile duct: Secondary | ICD-10-CM | POA: Insufficient documentation

## 2013-12-18 DIAGNOSIS — C3411 Malignant neoplasm of upper lobe, right bronchus or lung: Secondary | ICD-10-CM | POA: Diagnosis not present

## 2013-12-18 DIAGNOSIS — C3412 Malignant neoplasm of upper lobe, left bronchus or lung: Secondary | ICD-10-CM

## 2013-12-18 MED ORDER — EMOLLIENT BASE EX CREA: TOPICAL_CREAM | CUTANEOUS | Status: DC | PRN

## 2013-12-18 MED ORDER — BIAFINE EX EMUL
CUTANEOUS | Status: DC | PRN
  Administered 2013-12-18: 09:00:00 via TOPICAL

## 2013-12-18 NOTE — Progress Notes (Signed)
  Radiation Oncology         [phone] ________________________________  Name: Jasmine Grant MRN: [id number]  Date: 12/18/2013  DOB: 1955/09/29  Weekly Radiation Therapy Management    ICD-9-CM ICD-10-CM   1. 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 . . . . . . . The patient presents for routine under treatment assessment.                                   Weekly rad tx chest, patient education given, radiation therapy and you book, biafine cream, my business card, discussed skin irritation, fatigue, sob, throat changes, increase protein in diet, drink plenty fluids, may need to go to 5-6 smaller softer meals, has lidocaine patch on back pain 2-3/10 scale, no sob, 97% room air sats, let MD/staff know if difficulty swallowing or burning in throat occurs, will give carafte at that time  d/c hospital yesterday                                 Set-up films were reviewed.                                 The chart was checked. Physical Findings. . .  weight is 225 lb 8 oz (102.286 kg). Her oral temperature is 97.9 F (36.6 C). Her blood pressure is 155/89 and her pulse is 61. Her respiration is 16 and oxygen saturation is 97%. . Weight essentially stable.  No significant changes. Impression . . . . . . . The patient is tolerating radiation. Plan . . . . . . . . . . . . Continue treatment as planned.  ________________________________  Sheral Apley. Tammi Klippel, M.D.

## 2013-12-18 NOTE — Progress Notes (Signed)
Weekly rad tx chest, patient education given, radiation therapy and you book, biafine cream, my business card, discussed skin irritation, fatigue, sob, throat changes, increase protein in diet, drink plenty fluids, may need to go to 5-6 smaller softer meals, has lidocaine patch on back pain 2-3/10 scale, no sob, 97% room air sats, let MD/staff know if difficulty swallowing or burning in throat occurs, will give carafte at that time  d/c hospital yesterday 9:03 AM

## 2013-12-18 NOTE — Progress Notes (Signed)
  Radiation Oncology         [phone] ________________________________  Name: Jasmine Grant MRN: [id number]  Date: 12/16/2013  DOB: 04/01/1955  Simulation Verification Note    ICD-9-CM ICD-10-CM   1. Adenocarcinoma of upper lobe of right lung 162.3 C34.11     Status: inpatient  NARRATIVE: The patient was brought to the treatment unit and placed in the planned treatment position. The clinical setup was verified. Then port films were obtained and uploaded to the radiation oncology medical record software.  The treatment beams were carefully compared against the planned radiation fields. The position location and shape of the radiation fields was reviewed. They targeted volume of tissue appears to be appropriately covered by the radiation beams. Organs at risk appear to be excluded as planned.  Based on my personal review, I approved the simulation verification. The patient's treatment will proceed as planned.  ------------------------------------------------  Sheral Apley Tammi Klippel, M.D.

## 2013-12-18 NOTE — Telephone Encounter (Signed)
Explained to Jasmine Grant her PET scan is scheduled for 12/28/13 and to arrive at 11:45 and NPO 6 hours. She was agreeable.

## 2013-12-21 ENCOUNTER — Other Ambulatory Visit: Payer: Self-pay | Admitting: Radiation Oncology

## 2013-12-21 ENCOUNTER — Ambulatory Visit
Admit: 2013-12-21 | Discharge: 2013-12-21 | Disposition: A | Discharge status: Home / Self Care | Payer: No Typology Code available for payment source | Attending: Radiation Oncology | Admitting: Radiation Oncology

## 2013-12-21 DIAGNOSIS — Z510 Encounter for antineoplastic radiation therapy: Secondary | ICD-10-CM | POA: Diagnosis not present

## 2013-12-22 ENCOUNTER — Ambulatory Visit
Admission: RE | Admit: 2013-12-22 | Discharge: 2013-12-22 | Disposition: A | Discharge status: Home / Self Care | Payer: No Typology Code available for payment source | Source: Ambulatory Visit | Attending: Radiation Oncology | Admitting: Radiation Oncology

## 2013-12-22 DIAGNOSIS — Z510 Encounter for antineoplastic radiation therapy: Secondary | ICD-10-CM | POA: Diagnosis not present

## 2013-12-23 ENCOUNTER — Ambulatory Visit
Admission: RE | Admit: 2013-12-23 | Discharge: 2013-12-23 | Disposition: A | Discharge status: Home / Self Care | Payer: No Typology Code available for payment source | Source: Ambulatory Visit | Attending: Radiation Oncology | Admitting: Radiation Oncology

## 2013-12-23 DIAGNOSIS — Z510 Encounter for antineoplastic radiation therapy: Secondary | ICD-10-CM | POA: Diagnosis not present

## 2013-12-25 ENCOUNTER — Ambulatory Visit: Payer: No Typology Code available for payment source

## 2013-12-28 ENCOUNTER — Ambulatory Visit
Admission: RE | Admit: 2013-12-28 | Discharge: 2013-12-28 | Disposition: A | Discharge status: Home / Self Care | Payer: No Typology Code available for payment source | Source: Ambulatory Visit | Attending: Radiation Oncology | Admitting: Radiation Oncology

## 2013-12-28 ENCOUNTER — Ambulatory Visit (HOSPITAL_COMMUNITY)
Admission: RE | Admit: 2013-12-28 | Discharge: 2013-12-28 | Disposition: A | Discharge status: Home / Self Care | Payer: No Typology Code available for payment source | Source: Ambulatory Visit | Attending: Radiation Oncology | Admitting: Radiation Oncology

## 2013-12-28 DIAGNOSIS — Z85850 Personal history of malignant neoplasm of thyroid: Secondary | ICD-10-CM | POA: Diagnosis not present

## 2013-12-28 DIAGNOSIS — C7951 Secondary malignant neoplasm of bone: Secondary | ICD-10-CM | POA: Insufficient documentation

## 2013-12-28 DIAGNOSIS — K7460 Unspecified cirrhosis of liver: Secondary | ICD-10-CM | POA: Insufficient documentation

## 2013-12-28 DIAGNOSIS — R599 Enlarged lymph nodes, unspecified: Secondary | ICD-10-CM | POA: Diagnosis not present

## 2013-12-28 DIAGNOSIS — C3491 Malignant neoplasm of unspecified part of right bronchus or lung: Secondary | ICD-10-CM

## 2013-12-28 DIAGNOSIS — Z510 Encounter for antineoplastic radiation therapy: Secondary | ICD-10-CM | POA: Diagnosis not present

## 2013-12-28 DIAGNOSIS — R918 Other nonspecific abnormal finding of lung field: Secondary | ICD-10-CM | POA: Diagnosis not present

## 2013-12-28 DIAGNOSIS — C3490 Malignant neoplasm of unspecified part of unspecified bronchus or lung: Secondary | ICD-10-CM | POA: Diagnosis present

## 2013-12-28 DIAGNOSIS — I7090 Unspecified atherosclerosis: Secondary | ICD-10-CM | POA: Diagnosis present

## 2013-12-28 DIAGNOSIS — C3412 Malignant neoplasm of upper lobe, left bronchus or lung: Secondary | ICD-10-CM

## 2013-12-28 DIAGNOSIS — C787 Secondary malignant neoplasm of liver and intrahepatic bile duct: Secondary | ICD-10-CM

## 2013-12-28 DIAGNOSIS — K5710 Diverticulosis of small intestine without perforation or abscess without bleeding: Secondary | ICD-10-CM | POA: Diagnosis present

## 2013-12-28 LAB — GLUCOSE, CAPILLARY: Glucose-Capillary: 127 mg/dL — ABNORMAL HIGH (ref 70–99)

## 2013-12-28 MED ORDER — FLUDEOXYGLUCOSE F - 18 (FDG) INJECTION
10.8400 | Freq: Once | INTRAVENOUS | Status: AC | PRN
  Administered 2013-12-28: 10.84 via INTRAVENOUS

## 2013-12-29 ENCOUNTER — Ambulatory Visit (HOSPITAL_BASED_OUTPATIENT_CLINIC_OR_DEPARTMENT_OTHER): Payer: No Typology Code available for payment source | Admitting: Hematology and Oncology

## 2013-12-29 ENCOUNTER — Telehealth: Payer: Self-pay | Admitting: Hematology and Oncology

## 2013-12-29 ENCOUNTER — Ambulatory Visit: Payer: No Typology Code available for payment source

## 2013-12-29 ENCOUNTER — Telehealth: Payer: Self-pay | Admitting: *Deleted

## 2013-12-29 ENCOUNTER — Encounter: Payer: Self-pay | Admitting: Hematology and Oncology

## 2013-12-29 ENCOUNTER — Encounter: Payer: Self-pay | Admitting: Radiation Oncology

## 2013-12-29 ENCOUNTER — Ambulatory Visit (HOSPITAL_BASED_OUTPATIENT_CLINIC_OR_DEPARTMENT_OTHER): Payer: No Typology Code available for payment source

## 2013-12-29 ENCOUNTER — Ambulatory Visit
Admission: RE | Admit: 2013-12-29 | Discharge: 2013-12-29 | Disposition: A | Discharge status: Home / Self Care | Payer: No Typology Code available for payment source | Source: Ambulatory Visit | Attending: Radiation Oncology | Admitting: Radiation Oncology

## 2013-12-29 VITALS — BP 149/84 | HR 69 | Temp 98.0°F | Resp 18 | Ht 74.0 in | Wt 221.3 lb

## 2013-12-29 DIAGNOSIS — M8448XA Pathological fracture, other site, initial encounter for fracture: Secondary | ICD-10-CM

## 2013-12-29 DIAGNOSIS — Z510 Encounter for antineoplastic radiation therapy: Secondary | ICD-10-CM | POA: Diagnosis not present

## 2013-12-29 DIAGNOSIS — G4700 Insomnia, unspecified: Secondary | ICD-10-CM

## 2013-12-29 DIAGNOSIS — M4850XS Collapsed vertebra, not elsewhere classified, site unspecified, sequela of fracture: Secondary | ICD-10-CM

## 2013-12-29 DIAGNOSIS — C3411 Malignant neoplasm of upper lobe, right bronchus or lung: Secondary | ICD-10-CM

## 2013-12-29 HISTORY — DX: Insomnia, unspecified: G47.00

## 2013-12-29 LAB — CBC WITH DIFFERENTIAL/PLATELET
BASO%: 2.4 % — ABNORMAL HIGH (ref 0.0–2.0)
Basophils Absolute: 0.3 10*3/uL — ABNORMAL HIGH (ref 0.0–0.1)
EOS ABS: 0.1 10*3/uL (ref 0.0–0.5)
EOS%: 0.8 % (ref 0.0–7.0)
HEMATOCRIT: 46.8 % — AB (ref 34.8–46.6)
HGB: 14.9 g/dL (ref 11.6–15.9)
LYMPH%: 16.6 % (ref 14.0–49.7)
MCH: 29.9 pg (ref 25.1–34.0)
MCHC: 31.9 g/dL (ref 31.5–36.0)
MCV: 94 fL (ref 79.5–101.0)
MONO#: 0.9 10*3/uL (ref 0.1–0.9)
MONO%: 7.9 % (ref 0.0–14.0)
NEUT#: 7.9 10*3/uL — ABNORMAL HIGH (ref 1.5–6.5)
NEUT%: 72.3 % (ref 38.4–76.8)
PLATELETS: 237 10*3/uL (ref 145–400)
RBC: 4.98 10*6/uL (ref 3.70–5.45)
RDW: 13.4 % (ref 11.2–14.5)
WBC: 10.9 10*3/uL — AB (ref 3.9–10.3)
lymph#: 1.8 10*3/uL (ref 0.9–3.3)

## 2013-12-29 LAB — COMPREHENSIVE METABOLIC PANEL (CC13)
ALBUMIN: 3.3 g/dL — AB (ref 3.5–5.0)
ALT: 20 U/L (ref 0–55)
ANION GAP: 10 meq/L (ref 3–11)
AST: 16 U/L (ref 5–34)
Alkaline Phosphatase: 82 U/L (ref 40–150)
BILIRUBIN TOTAL: 0.52 mg/dL (ref 0.20–1.20)
BUN: 17.5 mg/dL (ref 7.0–26.0)
CALCIUM: 9.5 mg/dL (ref 8.4–10.4)
CHLORIDE: 104 meq/L (ref 98–109)
CO2: 25 meq/L (ref 22–29)
Creatinine: 0.7 mg/dL (ref 0.6–1.1)
GLUCOSE: 154 mg/dL — AB (ref 70–140)
Potassium: 4.9 mEq/L (ref 3.5–5.1)
SODIUM: 139 meq/L (ref 136–145)
TOTAL PROTEIN: 7.1 g/dL (ref 6.4–8.3)

## 2013-12-29 MED ORDER — MIRTAZAPINE 15 MG PO TABS: 15.0000 mg | ORAL_TABLET | Freq: Every day | ORAL | Status: DC

## 2013-12-29 NOTE — Telephone Encounter (Signed)
Per staff message and POF I have scheduled appts. Advised scheduler of appts. JMW  

## 2013-12-29 NOTE — Assessment & Plan Note (Signed)
She has significant anxiety and insomnia. I recommend a trial of mirtazapine

## 2013-12-29 NOTE — Progress Notes (Signed)
Received fax from [facility] for patient's short term disability, forwarded to nurse for processing on 12/1

## 2013-12-29 NOTE — Assessment & Plan Note (Signed)
She is currently asymptomatic. After she completes radiation therapy, I might refer her to get kyphoplasty. She may benefit with addition of intravenous bisphosphonate in the future.

## 2013-12-29 NOTE — Assessment & Plan Note (Signed)
We reviewed the imaging study in great detail. The patient appeared to have locally advanced cancer that are confined within the mediastinum and back. I discussed with her the utility of additional chemotherapy to achieve greater response to treatment while she is on radiation treatment. I would not wait for additional mutation study to proceed with treatment. We discussed the role of chemotherapy. The intent is for palliative.  We discussed some of the risks, benefits, side-effects of Paclitaxel and Carboplatin. Some of the short term side-effects included, though not limited to, including weight loss, life threatening infections, risk of allergic reactions, need for transfusions of blood products, nausea, vomiting, change in bowel habits, loss of hair, admission to hospital for various reasons, and risks of death.   Long term side-effects are also discussed including risks of infertility, permanent damage to nerve function, hearing loss, chronic fatigue, kidney damage with possibility needing hemodialysis, and rare secondary malignancy including bone marrow disorders.  The patient is aware that the response rates discussed earlier is not guaranteed.  After a long discussion, patient made an informed decision to proceed with the prescribed plan of care. I will schedule chemotherapy education class for the patient. I will see her next week prior to cycle 2 chemotherapy.

## 2013-12-29 NOTE — Progress Notes (Signed)
[facility] OFFICE PROGRESS NOTE  Patient Care Team: [name], MD as PCP - General (Family Medicine)  SUMMARY OF ONCOLOGIC HISTORY:   Adenocarcinoma of upper lobe of right lung   12/13/2013 - 12/17/2013 Hospital Admission She was admitted to the hospital for workup of severe pain and neurological deficit and was found to have 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   12/13/2013 Imaging CT scan show large mass in the right lung apex likely representing primary lung carcinoma or large metastasis. Metastases demonstrated in the pretracheal lymph nodes, T1 vertebral body and possibly sacrum.   12/14/2013 Initial Diagnosis Adenocarcinoma of upper lobe of right lung   12/14/2013 Pathology Results Accession: [id number] biopsy from right upper lobe showed adenocarcinoma   12/17/2013 Imaging MRI of the head is negative.   12/23/2013 -  Radiation Therapy She received radiation therapy to the mediastinal mass and bone   12/28/2013 Imaging PET/CT scan showed mediastinal mass, lymphadenopathy and T1 involvement.    INTERVAL HISTORY: Please see below for problem oriented charting. She is doing well. She self discontinued dexamethasone on 12/20/2013. She denies pain. She has small persistent numbness and weakness in her right hand but it is improving. She tolerates radiation therapy well.  REVIEW OF SYSTEMS:   Constitutional: Denies fevers, chills or abnormal weight loss Eyes: Denies blurriness of vision Ears, nose, mouth, throat, and face: Denies mucositis or sore throat Respiratory: Denies cough, dyspnea or wheezes Cardiovascular: Denies palpitation, chest discomfort or lower extremity swelling Gastrointestinal:  Denies nausea, heartburn or change in bowel habits Skin: Denies abnormal skin rashes Lymphatics: Denies new lymphadenopathy or easy bruising Behavioral/Psych: Mood is stable, no new changes  All other systems were reviewed with the  patient and are negative.  I have reviewed the past medical history, past surgical history, social history and family history with the patient and they are unchanged from previous note.  ALLERGIES:  is allergic to penicillins.  MEDICATIONS:  Current Outpatient Prescriptions  Medication Sig Dispense Refill  . ALPRAZolam (XANAX) 0.25 MG tablet Take 0.125 mg by mouth daily as needed. For anxiety    . cyclobenzaprine (FLEXERIL) 10 MG tablet Take 10 mg by mouth daily as needed for muscle spasms.    Marland Kitchen dexamethasone (DECADRON) 4 MG tablet Take 2 tablets (8 mg total) by mouth 2 (two) times daily with a meal. 120 tablet 0  . emollient (BIAFINE) cream Apply 1 application topically daily. Apply to chest area after rad tx daily and prn    . emollient (BIAFINE) cream Apply topically as needed. 454 g 0  . ibuprofen (ADVIL,MOTRIN) 200 MG tablet Take 400 mg by mouth once.    Marland Kitchen levothyroxine (SYNTHROID, LEVOTHROID) 125 MCG tablet Take 125 mcg by mouth daily.    Marland Kitchen levothyroxine (SYNTHROID, LEVOTHROID) 137 MCG tablet Take 137 mcg by mouth daily.    Marland Kitchen lidocaine (LIDODERM) 5 % Place 1 patch onto the skin daily. Remove & Discard patch within 12 hours or as directed by MD 30 patch 0  . meloxicam (MOBIC) 15 MG tablet Take 15 mg by mouth daily.    . methocarbamol (ROBAXIN) 500 MG tablet Take 500 mg by mouth 2 (two) times daily.    . ondansetron (ZOFRAN) 4 MG tablet Take 1 tablet (4 mg total) by mouth every 6 (six) hours as needed for nausea. 40 tablet 0  . OxyCODONE (OXYCONTIN) 10 mg T12A 12 hr tablet Take 1 tablet (10 mg total) by mouth every 12 (twelve)  hours. 60 tablet 0  . traMADol (ULTRAM) 50 MG tablet Take 2 tablets (100 mg total) by mouth every 6 (six) hours as needed for moderate pain. 90 tablet 1  . mirtazapine (REMERON) 15 MG tablet Take 1 tablet (15 mg total) by mouth at bedtime. 30 tablet 3   No current facility-administered medications for this visit.    PHYSICAL EXAMINATION: ECOG PERFORMANCE  STATUS: 1 - Symptomatic but completely ambulatory  Filed Vitals:   12/29/13 1340  BP: 149/84  Pulse: 69  Temp: 98 F (36.7 C)  Resp: 18   Filed Weights   12/29/13 1340  Weight: 221 lb 4.8 oz (100.381 kg)    GENERAL:alert, no distress and comfortable SKIN: skin color, texture, turgor are normal, no rashes or significant lesions EYES: normal, Conjunctiva are pink and non-injected, sclera clear OROPHARYNX:no exudate, no erythema and lips, buccal mucosa, and tongue normal  Musculoskeletal:no cyanosis of digits and no clubbing  NEURO: alert & oriented x 3 with fluent speech. She has persistent drooping of her right eyelid  LABORATORY DATA:  I have reviewed the data as listed    Component Value Date/Time   NA 139 12/29/2013 1603   NA 142 12/14/2013 0521   K 4.9 12/29/2013 1603   K 4.3 12/14/2013 0521   CL 104 12/14/2013 0521   CO2 25 12/29/2013 1603   CO2 26 12/14/2013 0521   GLUCOSE 154* 12/29/2013 1603   GLUCOSE 155* 12/14/2013 0521   BUN 17.5 12/29/2013 1603   BUN 11 12/14/2013 0521   CREATININE 0.7 12/29/2013 1603   CREATININE 0.55 12/14/2013 0521   CALCIUM 9.5 12/29/2013 1603   CALCIUM 9.1 12/14/2013 0521   PROT 7.1 12/29/2013 1603   PROT 7.5 12/13/2013 2018   ALBUMIN 3.3* 12/29/2013 1603   ALBUMIN 3.7 12/13/2013 2018   AST 16 12/29/2013 1603   AST 17 12/13/2013 2018   ALT 20 12/29/2013 1603   ALT 14 12/13/2013 2018   ALKPHOS 82 12/29/2013 1603   ALKPHOS 80 12/13/2013 2018   BILITOT 0.52 12/29/2013 1603   BILITOT 0.3 12/13/2013 2018   GFRNONAA >90 12/14/2013 0521   GFRAA >90 12/14/2013 0521    No results found for: SPEP, UPEP  Lab Results  Component Value Date   WBC 10.9* 12/29/2013   NEUTROABS 7.9* 12/29/2013   HGB 14.9 12/29/2013   HCT 46.8* 12/29/2013   MCV 94.0 12/29/2013   PLT 237 12/29/2013      Chemistry      Component Value Date/Time   NA 139 12/29/2013 1603   NA 142 12/14/2013 0521   K 4.9 12/29/2013 1603   K 4.3 12/14/2013 0521   CL  104 12/14/2013 0521   CO2 25 12/29/2013 1603   CO2 26 12/14/2013 0521   BUN 17.5 12/29/2013 1603   BUN 11 12/14/2013 0521   CREATININE 0.7 12/29/2013 1603   CREATININE 0.55 12/14/2013 0521      Component Value Date/Time   CALCIUM 9.5 12/29/2013 1603   CALCIUM 9.1 12/14/2013 0521   ALKPHOS 82 12/29/2013 1603   ALKPHOS 80 12/13/2013 2018   AST 16 12/29/2013 1603   AST 17 12/13/2013 2018   ALT 20 12/29/2013 1603   ALT 14 12/13/2013 2018   BILITOT 0.52 12/29/2013 1603   BILITOT 0.3 12/13/2013 2018       RADIOGRAPHIC STUDIES: I have personally reviewed the radiological images as listed and agreed with the findings in the report. Nm Pet Image Initial (pi) Skull Base To Thigh  12/28/2013   CLINICAL DATA:  Initial treatment strategy for lung cancer. History of thyroid cancer.  EXAM: NUCLEAR MEDICINE PET SKULL BASE TO THIGH  TECHNIQUE: 10.8 mCi F-18 FDG was injected intravenously. Full-ring PET imaging was performed from the skull base to thigh after the radiotracer. CT data was obtained and used for attenuation correction and anatomic localization.  FASTING BLOOD GLUCOSE:  Value: 127 mg/dl  COMPARISON:  CT chest abdomen pelvis 12/13/2013 and PET 10/23/2006.  FINDINGS: NECK  There is a partially calcified left carotid space lesion, measuring approximately 8 x 11 mm (CT image 25), with an SUV max of 8.7. Focal hypermetabolism is seen along the posterior aspect of the left vocal cords (SUV max 11.9), without a definite CT correlate (corresponding image would be approximately CT image 44). CT images show no acute findings.  CHEST  A right upper lobe mass has a long border contact with the superior chest wall and superior mediastinum, with extension into the right thoracic inlet. It is partially calcified and measures 5.5 x 5.6 cm with an SUV max of 16.7. Low right paratracheal lymph node measures 1.6 cm with an SUV max of 6.4. There is hypermetabolic right hilar adenopathy, poorly measured on CT due  to lack of IV contrast, but with an SUV max of 6.6. No additional abnormal hypermetabolism in the chest. CT images show no acute findings. Atherosclerotic calcification of the arterial vasculature. Heart size normal. No pericardial or pleural effusion. There are a few scattered pulmonary nodular densities measuring up to 6 mm in the right lower lobe, too small for PET resolution. Probable adherent debris in the trachea. There is narrowing of the right upper lobe bronchus.  ABDOMEN/PELVIS  No abnormal hypermetabolism in the liver, adrenal glands, spleen or pancreas. No hypermetabolic lymph nodes. CT images show irregularity of the liver margin with scattered low attenuation lesions, measuring up to 13 mm in the right hepatic lobe, similar to 10/23/2006. Liver, gallbladder, adrenal glands and right kidney are grossly unremarkable. Low and high attenuation lesions off the lower pole left kidney measure up to 10 mm and are difficult to further characterize without post-contrast imaging. Spleen, pancreas and stomach are unremarkable. Duodenal diverticulum incidentally noted. Small bowel and colon are grossly unremarkable. No free fluid.  SKELETON  There is focal uptake within the left aspect of the T1 vertebral body with slight lucency and irregularity on CT image 49, with an SUV max of 7.2. No associated rib destruction. No additional abnormal osseous hypermetabolism. Degenerative changes are seen throughout the spine.  IMPRESSION: 1. Right upper lobe mass/superior sulcus tumor extends into the thoracic inlet with hypermetabolic ipsilateral mediastinal adenopathy and T1 metastasis, most consistent with stage IV primary bronchogenic carcinoma. 2. A few scattered pulmonary nodules are too small for PET resolution. Continued attention on followup exams is warranted. 3. Hypermetabolic partially calcified left carotid space lesion, possibly present on prior exams but not definitely hypermetabolic. A benign schwannoma can  have this appearance. 4. Focal hypermetabolism along the posterior aspect of the left vocal cords, without definite CT correlate. If further evaluation of these findings (#3 and #4) is desired, CT neck with contrast is recommended. 5. Liver appears cirrhotic.   Electronically Signed   By: Lorin Picket M.D.   On: 12/28/2013 15:15     ASSESSMENT & PLAN:  Adenocarcinoma of upper lobe of right lung We reviewed the imaging study in great detail. The patient appeared to have locally advanced cancer that are confined within the mediastinum and  back. I discussed with her the utility of additional chemotherapy to achieve greater response to treatment while she is on radiation treatment. I would not wait for additional mutation study to proceed with treatment. We discussed the role of chemotherapy. The intent is for palliative.  We discussed some of the risks, benefits, side-effects of Paclitaxel and Carboplatin. Some of the short term side-effects included, though not limited to, including weight loss, life threatening infections, risk of allergic reactions, need for transfusions of blood products, nausea, vomiting, change in bowel habits, loss of hair, admission to hospital for various reasons, and risks of death.   Long term side-effects are also discussed including risks of infertility, permanent damage to nerve function, hearing loss, chronic fatigue, kidney damage with possibility needing hemodialysis, and rare secondary malignancy including bone marrow disorders.  The patient is aware that the response rates discussed earlier is not guaranteed.  After a long discussion, patient made an informed decision to proceed with the prescribed plan of care. I will schedule chemotherapy education class for the patient. I will see her next week prior to cycle 2 chemotherapy.   Pathologic compression fracture of spine She is currently asymptomatic. After she completes radiation therapy, I might refer her  to get kyphoplasty. She may benefit with addition of intravenous bisphosphonate in the future.  Insomnia She has significant anxiety and insomnia. I recommend a trial of mirtazapine   Orders Placed This Encounter  Procedures  . CBC with Differential    Standing Status: Standing     Number of Occurrences: 20     Standing Expiration Date: 12/30/2014  . Comprehensive metabolic panel    Standing Status: Standing     Number of Occurrences: 20     Standing Expiration Date: 12/30/2014   All questions were answered. The patient knows to call the clinic with any problems, questions or concerns. No barriers to learning was detected. I spent 40 minutes counseling the patient face to face. The total time spent in the appointment was 60 minutes and more than 50% was on counseling and review of test results     [facility], [place], MD 12/29/2013 8:40 PM

## 2013-12-29 NOTE — Telephone Encounter (Signed)
Pt confirmed labs/ov,chemo edu per 12/01 POF, gave pt AVS.... KJ, sent msg to add chemo

## 2013-12-30 ENCOUNTER — Encounter: Payer: Self-pay | Admitting: *Deleted

## 2013-12-30 ENCOUNTER — Other Ambulatory Visit: Payer: No Typology Code available for payment source

## 2013-12-30 ENCOUNTER — Ambulatory Visit
Admission: RE | Admit: 2013-12-30 | Discharge: 2013-12-30 | Disposition: A | Discharge status: Home / Self Care | Payer: No Typology Code available for payment source | Source: Ambulatory Visit | Attending: Radiation Oncology | Admitting: Radiation Oncology

## 2013-12-30 ENCOUNTER — Encounter: Payer: Self-pay | Admitting: Hematology and Oncology

## 2013-12-30 DIAGNOSIS — Z510 Encounter for antineoplastic radiation therapy: Secondary | ICD-10-CM | POA: Diagnosis not present

## 2013-12-30 NOTE — Progress Notes (Signed)
Put [employer] disability form on nurse's desk

## 2013-12-31 ENCOUNTER — Encounter: Payer: Self-pay | Admitting: *Deleted

## 2013-12-31 ENCOUNTER — Ambulatory Visit
Admission: RE | Admit: 2013-12-31 | Discharge: 2013-12-31 | Disposition: A | Discharge status: Home / Self Care | Payer: No Typology Code available for payment source | Source: Ambulatory Visit | Attending: Radiation Oncology | Admitting: Radiation Oncology

## 2013-12-31 ENCOUNTER — Encounter: Payer: Self-pay | Admitting: Hematology and Oncology

## 2013-12-31 ENCOUNTER — Encounter: Payer: Self-pay | Admitting: Radiation Oncology

## 2013-12-31 ENCOUNTER — Encounter (HOSPITAL_COMMUNITY): Payer: Self-pay

## 2013-12-31 DIAGNOSIS — Z510 Encounter for antineoplastic radiation therapy: Secondary | ICD-10-CM | POA: Diagnosis not present

## 2013-12-31 NOTE — Progress Notes (Signed)
Faxed disability form to [facility] [employer] [facility] @ [phone]

## 2013-12-31 NOTE — Progress Notes (Signed)
Disability paperwork signed by Dr. Alvy Bimler and returned to Carmelina Noun in managed care dept.Marland Kitchen

## 2013-12-31 NOTE — Progress Notes (Signed)
Patient denies pain, SOB, has occasional dry cough, denies loss of appetite. She is fatigued. She is not applying Biafine to treatment area, states her skin is "not red".

## 2014-01-01 ENCOUNTER — Ambulatory Visit: Payer: No Typology Code available for payment source

## 2014-01-01 ENCOUNTER — Ambulatory Visit
Admission: RE | Admit: 2014-01-01 | Discharge: 2014-01-01 | Disposition: A | Discharge status: Home / Self Care | Payer: No Typology Code available for payment source | Source: Ambulatory Visit | Attending: Radiation Oncology | Admitting: Radiation Oncology

## 2014-01-01 ENCOUNTER — Ambulatory Visit (HOSPITAL_BASED_OUTPATIENT_CLINIC_OR_DEPARTMENT_OTHER): Payer: No Typology Code available for payment source

## 2014-01-01 ENCOUNTER — Ambulatory Visit (HOSPITAL_BASED_OUTPATIENT_CLINIC_OR_DEPARTMENT_OTHER): Payer: No Typology Code available for payment source | Admitting: Nurse Practitioner

## 2014-01-01 ENCOUNTER — Other Ambulatory Visit: Payer: Self-pay

## 2014-01-01 DIAGNOSIS — C3411 Malignant neoplasm of upper lobe, right bronchus or lung: Secondary | ICD-10-CM

## 2014-01-01 DIAGNOSIS — Z5111 Encounter for antineoplastic chemotherapy: Secondary | ICD-10-CM

## 2014-01-01 DIAGNOSIS — T7840XA Allergy, unspecified, initial encounter: Secondary | ICD-10-CM

## 2014-01-01 MED ORDER — DIPHENHYDRAMINE HCL 50 MG/ML IJ SOLN
50.0000 mg | Freq: Once | INTRAMUSCULAR | Status: AC
  Administered 2014-01-01: 50 mg via INTRAVENOUS

## 2014-01-01 MED ORDER — SODIUM CHLORIDE 0.9 % IV SOLN
Freq: Once | INTRAVENOUS | Status: AC
  Administered 2014-01-01: 13:00:00 via INTRAVENOUS

## 2014-01-01 MED ORDER — DEXAMETHASONE SODIUM PHOSPHATE 20 MG/5ML IJ SOLN
20.0000 mg | Freq: Once | INTRAMUSCULAR | Status: AC
  Administered 2014-01-01: 20 mg via INTRAVENOUS

## 2014-01-01 MED ORDER — ONDANSETRON 16 MG/50ML IVPB (CHCC)
INTRAVENOUS | Status: AC
  Filled 2014-01-01: qty 16

## 2014-01-01 MED ORDER — DEXAMETHASONE SODIUM PHOSPHATE 20 MG/5ML IJ SOLN
INTRAMUSCULAR | Status: AC
  Filled 2014-01-01: qty 5

## 2014-01-01 MED ORDER — SODIUM CHLORIDE 0.9 % IV SOLN
290.0000 mg | Freq: Once | INTRAVENOUS | Status: AC
  Administered 2014-01-01: 290 mg via INTRAVENOUS
  Filled 2014-01-01: qty 29

## 2014-01-01 MED ORDER — FAMOTIDINE IN NACL 20-0.9 MG/50ML-% IV SOLN
INTRAVENOUS | Status: AC
  Filled 2014-01-01: qty 50

## 2014-01-01 MED ORDER — FAMOTIDINE IN NACL 20-0.9 MG/50ML-% IV SOLN
20.0000 mg | Freq: Once | INTRAVENOUS | Status: AC
  Administered 2014-01-01: 20 mg via INTRAVENOUS

## 2014-01-01 MED ORDER — LORAZEPAM 2 MG/ML IJ SOLN
INTRAMUSCULAR | Status: AC
  Filled 2014-01-01: qty 1

## 2014-01-01 MED ORDER — DEXTROSE 5 % IV SOLN
45.0000 mg/m2 | Freq: Once | INTRAVENOUS | Status: AC
  Administered 2014-01-01: 102 mg via INTRAVENOUS
  Filled 2014-01-01: qty 17

## 2014-01-01 MED ORDER — ONDANSETRON 16 MG/50ML IVPB (CHCC)
16.0000 mg | Freq: Once | INTRAVENOUS | Status: AC
  Administered 2014-01-01: 16 mg via INTRAVENOUS

## 2014-01-01 MED ORDER — METHYLPREDNISOLONE SODIUM SUCC 125 MG IJ SOLR
125.0000 mg | Freq: Once | INTRAMUSCULAR | Status: AC | PRN
  Administered 2014-01-01: 125 mg via INTRAVENOUS

## 2014-01-01 MED ORDER — DIPHENHYDRAMINE HCL 50 MG/ML IJ SOLN
INTRAMUSCULAR | Status: AC
  Filled 2014-01-01: qty 1

## 2014-01-01 MED ORDER — LORAZEPAM 2 MG/ML IJ SOLN
0.5000 mg | Freq: Once | INTRAMUSCULAR | Status: AC
  Administered 2014-01-01: 0.5 mg via INTRAVENOUS

## 2014-01-01 NOTE — Progress Notes (Signed)
Report from [facility] med/onc, patient being re challenged from Txol infusion, will repeat and if all goes well, totla time infusion 2 hours, spoke with Dr.moody, will cancel todays rad tx, informed [facility] RT Therapist, then went to lobby looking for spouse to send to infusion,Wilma to send him to med/onc infusion room, then went and spoke with patient and cancelled today' rad tx, we will see her Monday before rad tx", patient gave verbal understanding 2:57 PM

## 2014-01-01 NOTE — Progress Notes (Signed)
1410 Pt began complaining of "heartburn and nausea" states feels like "chest is on fire, nausea bad".  Taxol stopped. Notified Retta Mac NP and new orders received for Solumedrol 125mg . 1440 per Jenny Reichmann NP rechallenge Taxol in about 20 minutes. 1500 pt requests to speak to [facility] NP prior to rechallenge.

## 2014-01-01 NOTE — Patient Instructions (Signed)
[facility] Discharge Instructions for Patients Receiving Chemotherapy  Today you received the following chemotherapy agents Taxol, Carboplatin  To help prevent nausea and vomiting after your treatment, we encourage you to take your nausea medication as needed   If you develop nausea and vomiting that is not controlled by your nausea medication, call the clinic.   BELOW ARE SYMPTOMS THAT SHOULD BE REPORTED IMMEDIATELY:  *FEVER GREATER THAN 100.5 F  *CHILLS WITH OR WITHOUT FEVER  NAUSEA AND VOMITING THAT IS NOT CONTROLLED WITH YOUR NAUSEA MEDICATION  *UNUSUAL SHORTNESS OF BREATH  *UNUSUAL BRUISING OR BLEEDING  TENDERNESS IN MOUTH AND THROAT WITH OR WITHOUT PRESENCE OF ULCERS  *URINARY PROBLEMS  *BOWEL PROBLEMS  UNUSUAL RASH Items with * indicate a potential emergency and should be followed up as soon as possible.  Feel free to call the clinic you have any questions or concerns. The clinic phone number is [phone].

## 2014-01-04 ENCOUNTER — Ambulatory Visit
Admission: RE | Admit: 2014-01-04 | Discharge: 2014-01-04 | Disposition: A | Discharge status: Home / Self Care | Payer: No Typology Code available for payment source | Source: Ambulatory Visit | Attending: Radiation Oncology | Admitting: Radiation Oncology

## 2014-01-04 ENCOUNTER — Telehealth: Payer: Self-pay | Admitting: *Deleted

## 2014-01-04 ENCOUNTER — Encounter: Payer: Self-pay | Admitting: Nurse Practitioner

## 2014-01-04 DIAGNOSIS — T7840XA Allergy, unspecified, initial encounter: Secondary | ICD-10-CM | POA: Insufficient documentation

## 2014-01-04 DIAGNOSIS — Z510 Encounter for antineoplastic radiation therapy: Secondary | ICD-10-CM | POA: Diagnosis not present

## 2014-01-04 NOTE — Assessment & Plan Note (Signed)
Patient presented to the [facility] today to receive cycle 1 of her Taxol/carboplatin chemotherapy regimen.  She developed a mild hypersensitivity reaction to the Taxol which consisted of some reflux symptoms; with some vague discomfort that radiated to her back.  She denied any specific chest pain, chest pressure, shortness of breath, or pain with inspiration.  Confirmed the patient was premedicated with Benadryl 50 mg IV, Zofran 16 mg IV, and dexamethasone 20 mg IV.  Vital signs remained stable throughout.  EKG obtained revealed a normal sinus rhythm with a rate of 62 and a QTC of 426.  Taxol infusion was held; and patient received a Medrol 125 mg IV, and Pepcid 20 mg IV.  All of patient's symptoms did completely resolve; and patient was able to complete the Taxol and carboplatin as previously directed.

## 2014-01-04 NOTE — Progress Notes (Signed)
Patient seen in the back [place]# 4, c/o indigestion really bad, nedd to be seen before tx, patient doing better after taxol on Friday, rx for indigestione-scribed in [place], patient stated [place] , will see MD wed, after tx ,will check her indigestion c/o's again  then 3:06 PM

## 2014-01-04 NOTE — Assessment & Plan Note (Signed)
Patient initiated cycle one of her Taxol/carboplatin chemotherapy regimen with concurrent radiotherapy today.  This will be cycled on and every week basis.  Patient did develop a mild hypersensitivity reaction to the Taxol; which was managed and resolved per the hypersensitivity protocol.  Patient has plans to return on 01/08/2014 for cycle 2 of the same regimen.

## 2014-01-04 NOTE — Progress Notes (Signed)
will   SYMPTOM MANAGEMENT CLINIC   HPI: Jasmine Grant 58 y.o. female diagnosed with lung cancer.  Currently undergoing Taxol/carboplatin/radiation therapy.  Patient presented to the [facility] today to initiate cycle 1 of her Taxol/chemotherapy.  She developed a mild hypersensitivity reaction which consisted of GERD-like symptoms and some mild discomfort radiating towards her back.  She denied any chest pain, chest pressure, shortness breath, or pain with inspiration.  Vital signs remained stable throughout.  Confirmed the patient was premedicated with Benadryl 50 mg, dexamethasone 20 mg, and Zofran 16 mg IV.  Patient was given Solu-Medrol 125 mg IV, and Pepcid 20 mg per hypersensitivity protocol.  EKG obtained revealed a normal sinus rhythm.  All symptoms did resolve; patient was able to complete her chemotherapy regimen as previously directed.   HPI  CURRENT THERAPY: Upcoming Treatment Dates - LUNG Carboplatin / Paclitaxel + XRT q7d Days with orders from any treatment category:  01/08/2014      SCHEDULING COMMUNICATION      diphenhydrAMINE (BENADRYL) injection 50 mg      Dexamethasone Sodium Phosphate (DECADRON) injection 20 mg      ondansetron (ZOFRAN) IVPB 16 mg      famotidine (PEPCID) IVPB 20 mg      PACLitaxel (TAXOL) 102 mg in dextrose 5 % 250 mL chemo infusion (</= 80mg /m2)      CARBOplatin (PARAPLATIN) 290 mg in sodium chloride 0.9 % 100 mL chemo infusion      sodium chloride 0.9 % injection 10 mL      heparin lock flush 100 unit/mL      heparin lock flush 100 unit/mL      alteplase (CATHFLO ACTIVASE) injection 2 mg      sodium chloride 0.9 % injection 3 mL      Cold Pack 1 packet      diphenhydrAMINE (BENADRYL) injection 25 mg      famotidine (PEPCID) IVPB 20 mg      0.9 %  sodium chloride infusion      methylPREDNISolone sodium succinate (SOLU-MEDROL) 125 mg/2 mL injection 125 mg      EPINEPHrine (ADRENALIN) 0.1 MG/ML injection 0.25 mg      EPINEPHrine  (ADRENALIN) 0.1 MG/ML injection 0.25 mg      EPINEPHrine (ADRENALIN) injection 0.5 mg      EPINEPHrine (ADRENALIN) injection 0.5 mg      diphenhydrAMINE (BENADRYL) injection 50 mg      albuterol (PROVENTIL) (2.5 MG/3ML) 0.083% nebulizer solution 2.5 mg      0.9 %  sodium chloride infusion      TREATMENT CONDITIONS 01/15/2014      SCHEDULING COMMUNICATION      diphenhydrAMINE (BENADRYL) injection 50 mg      Dexamethasone Sodium Phosphate (DECADRON) injection 20 mg      ondansetron (ZOFRAN) IVPB 16 mg      famotidine (PEPCID) IVPB 20 mg      PACLitaxel (TAXOL) 102 mg in dextrose 5 % 250 mL chemo infusion (</= 80mg /m2)      CARBOplatin (PARAPLATIN) 290 mg in sodium chloride 0.9 % 100 mL chemo infusion      sodium chloride 0.9 % injection 10 mL      heparin lock flush 100 unit/mL      heparin lock flush 100 unit/mL      alteplase (CATHFLO ACTIVASE) injection 2 mg      sodium chloride 0.9 % injection 3 mL      Cold Pack 1 packet  diphenhydrAMINE (BENADRYL) injection 25 mg      famotidine (PEPCID) IVPB 20 mg      0.9 %  sodium chloride infusion      methylPREDNISolone sodium succinate (SOLU-MEDROL) 125 mg/2 mL injection 125 mg      EPINEPHrine (ADRENALIN) 0.1 MG/ML injection 0.25 mg      EPINEPHrine (ADRENALIN) 0.1 MG/ML injection 0.25 mg      EPINEPHrine (ADRENALIN) injection 0.5 mg      EPINEPHrine (ADRENALIN) injection 0.5 mg      diphenhydrAMINE (BENADRYL) injection 50 mg      albuterol (PROVENTIL) (2.5 MG/3ML) 0.083% nebulizer solution 2.5 mg      0.9 %  sodium chloride infusion      TREATMENT CONDITIONS 01/22/2014      SCHEDULING COMMUNICATION      diphenhydrAMINE (BENADRYL) injection 50 mg      Dexamethasone Sodium Phosphate (DECADRON) injection 20 mg      ondansetron (ZOFRAN) IVPB 16 mg      famotidine (PEPCID) IVPB 20 mg      PACLitaxel (TAXOL) 102 mg in dextrose 5 % 250 mL chemo infusion (</= 80mg /m2)      CARBOplatin (PARAPLATIN) 290 mg in sodium chloride 0.9 % 100 mL  chemo infusion      sodium chloride 0.9 % injection 10 mL      heparin lock flush 100 unit/mL      heparin lock flush 100 unit/mL      alteplase (CATHFLO ACTIVASE) injection 2 mg      sodium chloride 0.9 % injection 3 mL      Cold Pack 1 packet      diphenhydrAMINE (BENADRYL) injection 25 mg      famotidine (PEPCID) IVPB 20 mg      0.9 %  sodium chloride infusion      methylPREDNISolone sodium succinate (SOLU-MEDROL) 125 mg/2 mL injection 125 mg      EPINEPHrine (ADRENALIN) 0.1 MG/ML injection 0.25 mg      EPINEPHrine (ADRENALIN) 0.1 MG/ML injection 0.25 mg      EPINEPHrine (ADRENALIN) injection 0.5 mg      EPINEPHrine (ADRENALIN) injection 0.5 mg      diphenhydrAMINE (BENADRYL) injection 50 mg      albuterol (PROVENTIL) (2.5 MG/3ML) 0.083% nebulizer solution 2.5 mg      0.9 %  sodium chloride infusion      TREATMENT CONDITIONS    ROS  Past Medical History  Diagnosis Date  . Panic attack   . Hx of thyroid cancer     10 years ago  . Insomnia 12/29/2013    Past Surgical History  Procedure Laterality Date  . Thyroidectomy      has Lung mass; Epidural mass; SVC syndrome; Pathologic compression fracture of spine; Generalized anxiety disorder; Hypothyroidism; Overweight (BMI 25.0-29.9); Elevated blood pressure; Nausea with vomiting; Adenocarcinoma of upper lobe of right lung; Constipation due to opioid therapy; Insomnia; and Hypersensitivity reaction on her problem list.     is allergic to penicillins.    Medication List       This list is accurate as of: 01/01/14 11:59 PM.  Always use your most recent med list.               ALPRAZolam 0.25 MG tablet  Commonly known as:  XANAX  Take 0.125 mg by mouth daily as needed. For anxiety     cyclobenzaprine 10 MG tablet  Commonly known as:  FLEXERIL  Take 10 mg by mouth daily as  needed for muscle spasms.     dexamethasone 4 MG tablet  Commonly known as:  DECADRON  Take 2 tablets (8 mg total) by mouth 2 (two) times daily  with a meal.     emollient cream  Commonly known as:  BIAFINE  Apply 1 application topically daily. Apply to chest area after rad tx daily and prn     emollient cream  Commonly known as:  BIAFINE  Apply topically as needed.     ibuprofen 200 MG tablet  Commonly known as:  ADVIL,MOTRIN  Take 400 mg by mouth once.     levothyroxine 137 MCG tablet  Commonly known as:  SYNTHROID, LEVOTHROID  Take 137 mcg by mouth daily.     levothyroxine 125 MCG tablet  Commonly known as:  SYNTHROID, LEVOTHROID  Take 125 mcg by mouth daily.     lidocaine 5 %  Commonly known as:  LIDODERM  Place 1 patch onto the skin daily. Remove & Discard patch within 12 hours or as directed by MD     meloxicam 15 MG tablet  Commonly known as:  MOBIC  Take 15 mg by mouth daily.     methocarbamol 500 MG tablet  Commonly known as:  ROBAXIN  Take 500 mg by mouth 2 (two) times daily.     mirtazapine 15 MG tablet  Commonly known as:  REMERON  Take 1 tablet (15 mg total) by mouth at bedtime.     ondansetron 4 MG tablet  Commonly known as:  ZOFRAN  Take 1 tablet (4 mg total) by mouth every 6 (six) hours as needed for nausea.     OxyCODONE 10 mg T12a 12 hr tablet  Commonly known as:  OXYCONTIN  Take 1 tablet (10 mg total) by mouth every 12 (twelve) hours.     traMADol 50 MG tablet  Commonly known as:  ULTRAM  Take 2 tablets (100 mg total) by mouth every 6 (six) hours as needed for moderate pain.         PHYSICAL EXAMINATION  Vitals: BP 157/73, HR 63, temp 98.6  Physical Exam  Constitutional: She is oriented to person, place, and time and well-developed, well-nourished, and in no distress.  HENT:  Head: Normocephalic and atraumatic.  Eyes: Conjunctivae and EOM are normal. Pupils are equal, round, and reactive to light. Right eye exhibits no discharge. Left eye exhibits no discharge. No scleral icterus.  Neck: Normal range of motion. Neck supple.  Cardiovascular: Normal rate, regular rhythm, normal  heart sounds and intact distal pulses.   Pulmonary/Chest: Effort normal and breath sounds normal. No respiratory distress. She has no wheezes. She has no rales.  Abdominal: Soft. Bowel sounds are normal. She exhibits no distension. There is no tenderness. There is no rebound.  Musculoskeletal: Normal range of motion. She exhibits no edema or tenderness.  Neurological: She is alert and oriented to person, place, and time. Gait normal.  Skin: Skin is warm and dry. No rash noted. No erythema.  Psychiatric: Affect normal.  Nursing note and vitals reviewed.   LABORATORY DATA:. No visits with results within 3 Day(s) from this visit. Latest known visit with results is:  Appointment on 12/29/2013  Component Date Value Ref Range Status  . WBC 12/29/2013 10.9* 3.9 - 10.3 10e3/uL Final  . NEUT# 12/29/2013 7.9* 1.5 - 6.5 10e3/uL Final  . HGB 12/29/2013 14.9  11.6 - 15.9 g/dL Final  . HCT 12/29/2013 46.8* 34.8 - 46.6 % Final  . Platelets 12/29/2013 237  145 - 400 10e3/uL Final  . MCV 12/29/2013 94.0  79.5 - 101.0 fL Final  . MCH 12/29/2013 29.9  25.1 - 34.0 pg Final  . MCHC 12/29/2013 31.9  31.5 - 36.0 g/dL Final  . RBC 12/29/2013 4.98  3.70 - 5.45 10e6/uL Final  . RDW 12/29/2013 13.4  11.2 - 14.5 % Final  . lymph# 12/29/2013 1.8  0.9 - 3.3 10e3/uL Final  . MONO# 12/29/2013 0.9  0.1 - 0.9 10e3/uL Final  . Eosinophils Absolute 12/29/2013 0.1  0.0 - 0.5 10e3/uL Final  . Basophils Absolute 12/29/2013 0.3* 0.0 - 0.1 10e3/uL Final  . NEUT% 12/29/2013 72.3  38.4 - 76.8 % Final  . LYMPH% 12/29/2013 16.6  14.0 - 49.7 % Final  . MONO% 12/29/2013 7.9  0.0 - 14.0 % Final  . EOS% 12/29/2013 0.8  0.0 - 7.0 % Final  . BASO% 12/29/2013 2.4* 0.0 - 2.0 % Final  . Sodium 12/29/2013 139  136 - 145 mEq/L Final  . Potassium 12/29/2013 4.9  3.5 - 5.1 mEq/L Final  . Chloride 12/29/2013 104  98 - 109 mEq/L Final  . CO2 12/29/2013 25  22 - 29 mEq/L Final  . Glucose 12/29/2013 154* 70 - 140 mg/dl Final  . BUN  12/29/2013 17.5  7.0 - 26.0 mg/dL Final  . Creatinine 12/29/2013 0.7  0.6 - 1.1 mg/dL Final  . Total Bilirubin 12/29/2013 0.52  0.20 - 1.20 mg/dL Final  . Alkaline Phosphatase 12/29/2013 82  40 - 150 U/L Final  . AST 12/29/2013 16  5 - 34 U/L Final  . ALT 12/29/2013 20  0 - 55 U/L Final  . Total Protein 12/29/2013 7.1  6.4 - 8.3 g/dL Final  . Albumin 12/29/2013 3.3* 3.5 - 5.0 g/dL Final  . Calcium 12/29/2013 9.5  8.4 - 10.4 mg/dL Final  . Anion Gap 12/29/2013 10  3 - 11 mEq/L Final   EKG:  LILLIANNE, EICK GU:[id number] 01-Jan-2014 14:28:00 [facility] ONC ROUTINE RECORD Normal sinus rhythm Normal ECG Confirmed by COOPER MD, MICHAEL ([id number]) on 01/02/2014 6:29:31 PM 2mm/s 40mm/mV 100Hz  8.0 SP2 12SL 237 CID: [id number] Referred by: Confirmed By: Sherren Mocha MD Vent. rate 62 BPM PR interval 152 ms QRS duration 88 ms QT/QTc 420/426 ms P-R-T axes 76 75 76 1955/05/09 (58 yr) Female Caucasian Room: Loc:511 Technician: Jossie Ng Test ind: :  RADIOGRAPHIC STUDIES: No results found.  ASSESSMENT/PLAN:    Adenocarcinoma of upper lobe of right lung Patient initiated cycle one of her Taxol/carboplatin chemotherapy regimen with concurrent radiotherapy today.  This will be cycled on and every week basis.  Patient did develop a mild hypersensitivity reaction to the Taxol; which was managed and resolved per the hypersensitivity protocol.  Patient has plans to return on 01/08/2014 for cycle 2 of the same regimen.  Hypersensitivity reaction Patient presented to the [facility] today to receive cycle 1 of her Taxol/carboplatin chemotherapy regimen.  She developed a mild hypersensitivity reaction to the Taxol which consisted of some reflux symptoms; with some vague discomfort that radiated to her back.  She denied any specific chest pain, chest pressure, shortness of breath, or pain with inspiration.  Confirmed the patient was premedicated with Benadryl 50 mg IV, Zofran 16 mg IV,  and dexamethasone 20 mg IV.  Vital signs remained stable throughout.  EKG obtained revealed a normal sinus rhythm with a rate of 62 and a QTC of 426.  Taxol infusion was held; and patient received a Medrol 125 mg IV,  and Pepcid 20 mg IV.  All of patient's symptoms did completely resolve; and patient was able to complete the Taxol and carboplatin as previously directed.    Patient stated understanding of all instructions; and was in agreement with this plan of care. The patient knows to call the clinic with any problems, questions or concerns.   Review/collaboration with Dr. Alvy Bimler regarding all aspects of patient's visit today.   Total time spent with patient was 25 minutes;  with greater than 80 percent of that time spent in face to face counseling regarding her symptoms, frequent monitoring of patient while on the infusion area, and coordination of care and follow up.  Disclaimer: This note was dictated with voice recognition software. Similar sounding words can inadvertently be transcribed and may not be corrected upon review.   Drue Second, NP 01/04/2014

## 2014-01-04 NOTE — Telephone Encounter (Signed)
Spoke with pt today for post chemo follow up.  Stated she is doing fine.  Denied nausea/vomiting, denied pain, good appetite and drinking lots of fluids as tolerated.  Bowel and bladder functions fine.  Reinforced increased po fluids intake with pt.  Pt voiced understanding, and aware of follow up appt on  01/07/14.

## 2014-01-05 ENCOUNTER — Ambulatory Visit
Admission: RE | Admit: 2014-01-05 | Discharge: 2014-01-05 | Disposition: A | Discharge status: Home / Self Care | Payer: No Typology Code available for payment source | Source: Ambulatory Visit | Attending: Radiation Oncology | Admitting: Radiation Oncology

## 2014-01-05 DIAGNOSIS — Z510 Encounter for antineoplastic radiation therapy: Secondary | ICD-10-CM | POA: Diagnosis not present

## 2014-01-06 ENCOUNTER — Encounter: Payer: Self-pay | Admitting: Radiation Oncology

## 2014-01-06 ENCOUNTER — Ambulatory Visit
Admission: RE | Admit: 2014-01-06 | Discharge: 2014-01-06 | Disposition: A | Discharge status: Home / Self Care | Payer: No Typology Code available for payment source | Source: Ambulatory Visit | Attending: Radiation Oncology | Admitting: Radiation Oncology

## 2014-01-06 VITALS — BP 134/85 | HR 80 | Temp 98.1°F | Resp 20 | Wt 224.0 lb

## 2014-01-06 DIAGNOSIS — C3411 Malignant neoplasm of upper lobe, right bronchus or lung: Secondary | ICD-10-CM

## 2014-01-06 DIAGNOSIS — Z510 Encounter for antineoplastic radiation therapy: Secondary | ICD-10-CM | POA: Diagnosis not present

## 2014-01-06 MED ORDER — SUCRALFATE 1 G PO TABS: 1.0000 g | ORAL_TABLET | Freq: Four times a day (QID) | ORAL | Status: DC

## 2014-01-06 NOTE — Progress Notes (Signed)
Department of Radiation Oncology  Phone:  [phone] Fax:        [phone]  Weekly Treatment Note    Name: Jasmine Grant Date: 01/06/2014 MRN: [id number] DOB: 12/20/1955   Current dose: 26 Gy  Current fraction: 13   MEDICATIONS: Current Outpatient Prescriptions  Medication Sig Dispense Refill  . ALPRAZolam (XANAX) 0.25 MG tablet Take 0.125 mg by mouth daily as needed. For anxiety    . cyclobenzaprine (FLEXERIL) 10 MG tablet Take 10 mg by mouth daily as needed for muscle spasms.    Marland Kitchen emollient (BIAFINE) cream Apply topically as needed. 454 g 0  . ibuprofen (ADVIL,MOTRIN) 200 MG tablet Take 400 mg by mouth once.    Marland Kitchen levothyroxine (SYNTHROID, LEVOTHROID) 125 MCG tablet Take 125 mcg by mouth daily.    Marland Kitchen levothyroxine (SYNTHROID, LEVOTHROID) 137 MCG tablet Take 137 mcg by mouth daily.    Marland Kitchen lidocaine (LIDODERM) 5 % Place 1 patch onto the skin daily. Remove & Discard patch within 12 hours or as directed by MD 30 patch 0  . meloxicam (MOBIC) 15 MG tablet Take 15 mg by mouth daily.    . mirtazapine (REMERON) 15 MG tablet Take 1 tablet (15 mg total) by mouth at bedtime. 30 tablet 3  . traMADol (ULTRAM) 50 MG tablet Take 2 tablets (100 mg total) by mouth every 6 (six) hours as needed for moderate pain. 90 tablet 1  . dexamethasone (DECADRON) 4 MG tablet Take 2 tablets (8 mg total) by mouth 2 (two) times daily with a meal. (Patient not taking: Reported on 01/06/2014) 120 tablet 0  . methocarbamol (ROBAXIN) 500 MG tablet Take 500 mg by mouth 2 (two) times daily.    . ondansetron (ZOFRAN) 4 MG tablet Take 1 tablet (4 mg total) by mouth every 6 (six) hours as needed for nausea. (Patient not taking: Reported on 01/06/2014) 40 tablet 0  . OxyCODONE (OXYCONTIN) 10 mg T12A 12 hr tablet Take 1 tablet (10 mg total) by mouth every 12 (twelve) hours. (Patient not taking: Reported on 01/06/2014) 60 tablet 0   No current facility-administered medications for this encounter.     ALLERGIES:  Penicillins   LABORATORY DATA:  Lab Results  Component Value Date   WBC 10.9* 12/29/2013   HGB 14.9 12/29/2013   HCT 46.8* 12/29/2013   MCV 94.0 12/29/2013   PLT 237 12/29/2013   Lab Results  Component Value Date   NA 139 12/29/2013   K 4.9 12/29/2013   CL 104 12/14/2013   CO2 25 12/29/2013   Lab Results  Component Value Date   ALT 20 12/29/2013   AST 16 12/29/2013   ALKPHOS 82 12/29/2013   BILITOT 0.52 12/29/2013     NARRATIVE: Jasmine Grant was seen today for weekly treatment management. The chart was checked and the patient's films were reviewed.  Weekly rad txs, rt lung, no skin change ,not using biafine as yet, prevacid is helping during the night but not during the day, request carafate , starting to have difficulty swallowing and feels getting stuck, fatigue mild, a, drinks plenty water,tea and gingerale, dry cough, has dry mouth, sees Dr. Alvy Bimler tomorrow, and lab work, chemotherapy on Friday  PHYSICAL EXAMINATION: weight is 224 lb (101.606 kg). Her oral temperature is 98.1 F (36.7 C). Her blood pressure is 134/85 and her pulse is 80. Her respiration is 20.        ASSESSMENT: The patient is doing satisfactorily with treatment.  PLAN: We will continue  with the patient's radiation treatment as planned. The patient was given a prescription for Carafate.

## 2014-01-06 NOTE — Progress Notes (Signed)
Weekly rad txs, rt lung, no skin change ,not using biafine as yet, prevacid is helping during the night but not during the day, request carafate , starting to have difficulty swallowing and feels getting stuck, fatigue mild, a, drinks plenty water,tea and gingerale, dry cough, has dry mouth, sees Dr. Alvy Bimler tomorrow, and lab work, chemotherapy on Friday 3:04 PM

## 2014-01-07 ENCOUNTER — Other Ambulatory Visit (HOSPITAL_BASED_OUTPATIENT_CLINIC_OR_DEPARTMENT_OTHER): Payer: No Typology Code available for payment source

## 2014-01-07 ENCOUNTER — Ambulatory Visit
Admission: RE | Admit: 2014-01-07 | Discharge: 2014-01-07 | Disposition: A | Discharge status: Home / Self Care | Payer: No Typology Code available for payment source | Source: Ambulatory Visit | Attending: Radiation Oncology | Admitting: Radiation Oncology

## 2014-01-07 ENCOUNTER — Ambulatory Visit (HOSPITAL_BASED_OUTPATIENT_CLINIC_OR_DEPARTMENT_OTHER): Payer: No Typology Code available for payment source | Admitting: Hematology and Oncology

## 2014-01-07 ENCOUNTER — Telehealth: Payer: Self-pay | Admitting: Hematology and Oncology

## 2014-01-07 ENCOUNTER — Encounter: Payer: Self-pay | Admitting: Hematology and Oncology

## 2014-01-07 ENCOUNTER — Telehealth: Payer: Self-pay | Admitting: *Deleted

## 2014-01-07 VITALS — BP 153/68 | HR 59 | Temp 98.2°F | Resp 18 | Ht 74.0 in | Wt 222.8 lb

## 2014-01-07 DIAGNOSIS — K209 Esophagitis, unspecified without bleeding: Secondary | ICD-10-CM

## 2014-01-07 DIAGNOSIS — C3411 Malignant neoplasm of upper lobe, right bronchus or lung: Secondary | ICD-10-CM

## 2014-01-07 DIAGNOSIS — C7951 Secondary malignant neoplasm of bone: Secondary | ICD-10-CM

## 2014-01-07 DIAGNOSIS — Z510 Encounter for antineoplastic radiation therapy: Secondary | ICD-10-CM | POA: Diagnosis not present

## 2014-01-07 LAB — COMPREHENSIVE METABOLIC PANEL (CC13)
ALK PHOS: 77 U/L (ref 40–150)
ALT: 33 U/L (ref 0–55)
AST: 21 U/L (ref 5–34)
Albumin: 3.3 g/dL — ABNORMAL LOW (ref 3.5–5.0)
Anion Gap: 9 mEq/L (ref 3–11)
BILIRUBIN TOTAL: 0.52 mg/dL (ref 0.20–1.20)
BUN: 14.6 mg/dL (ref 7.0–26.0)
CO2: 24 meq/L (ref 22–29)
CREATININE: 0.7 mg/dL (ref 0.6–1.1)
Calcium: 9.4 mg/dL (ref 8.4–10.4)
Chloride: 108 mEq/L (ref 98–109)
EGFR: >90 mL/min/{1.73_m2} (ref >90)
Glucose: 116 mg/dl (ref 70–140)
Potassium: 4.7 mEq/L (ref 3.5–5.1)
SODIUM: 142 meq/L (ref 136–145)
TOTAL PROTEIN: 6.8 g/dL (ref 6.4–8.3)

## 2014-01-07 LAB — CBC WITH DIFFERENTIAL/PLATELET
BASO%: 0.5 % (ref 0.0–2.0)
BASOS ABS: 0 10*3/uL (ref 0.0–0.1)
EOS ABS: 0.1 10*3/uL (ref 0.0–0.5)
EOS%: 3.2 % (ref 0.0–7.0)
HEMATOCRIT: 41.1 % (ref 34.8–46.6)
HGB: 13.5 g/dL (ref 11.6–15.9)
LYMPH%: 24.5 % (ref 14.0–49.7)
MCH: 30.5 pg (ref 25.1–34.0)
MCHC: 32.8 g/dL (ref 31.5–36.0)
MCV: 92.8 fL (ref 79.5–101.0)
MONO#: 0.3 10*3/uL (ref 0.1–0.9)
MONO%: 6.7 % (ref 0.0–14.0)
NEUT%: 65.1 % (ref 38.4–76.8)
NEUTROS ABS: 2.6 10*3/uL (ref 1.5–6.5)
PLATELETS: 166 10*3/uL (ref 145–400)
RBC: 4.43 10*6/uL (ref 3.70–5.45)
RDW: 13.1 % (ref 11.2–14.5)
WBC: 4 10*3/uL (ref 3.9–10.3)
lymph#: 1 10*3/uL (ref 0.9–3.3)

## 2014-01-07 NOTE — Assessment & Plan Note (Signed)
She had minor sensitivity reaction to Taxol last week but managed to complete her treatment on time. We will be careful with chemotherapy tomorrow. If she has further infusion reaction to Taxol again, I might switch her to Abraxane So far she tolerated treatment very well without any other side effects.

## 2014-01-07 NOTE — Telephone Encounter (Signed)
Gave avs & cal for Dec. °

## 2014-01-07 NOTE — Progress Notes (Signed)
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   12/13/2013 Imaging CT scan show large mass in the right lung apex likely representing primary lung carcinoma or large metastasis. Metastases demonstrated in the pretracheal lymph nodes, T1 vertebral body and possibly sacrum.   12/14/2013 Initial Diagnosis Adenocarcinoma of upper lobe of right lung   12/14/2013 Pathology Results Accession: [id number] biopsy from right upper lobe showed adenocarcinoma   12/17/2013 Imaging MRI of the head is negative.   12/23/2013 -  Radiation Therapy She received radiation therapy to the mediastinal mass and bone   12/28/2013 Imaging PET/CT scan showed mediastinal mass, lymphadenopathy and T1 involvement.   01/01/2014 -  Chemotherapy Weekly carboplatin and Taxol were added     INTERVAL HISTORY: Please see below for problem oriented charting. She is seen prior to cycle 2 of therapy. Last week, she has infusion reaction to Taxol, resolved with additional premedication. She tolerated treatment well without any side effects. Denies any pain. Neurological deficit and weakness is improving. She complained of heartburn and has been started on Carafate.  REVIEW OF SYSTEMS:   Constitutional: Denies fevers, chills or abnormal weight loss Eyes: Denies blurriness of vision Ears, nose, mouth, throat, and face: Denies mucositis or sore throat Respiratory: Denies cough, dyspnea or wheezes Cardiovascular: Denies palpitation, chest discomfort or lower extremity swelling Skin: Denies abnormal skin rashes Lymphatics: Denies new lymphadenopathy or easy  bruising Neurological:Denies numbness, tingling or new weaknesses Behavioral/Psych: Mood is stable, no new changes  All other systems were reviewed with the patient and are negative.  I have reviewed the past medical history, past surgical history, social history and family history with the patient and they are unchanged from previous note.  ALLERGIES:  is allergic to penicillins.  MEDICATIONS:  Current Outpatient Prescriptions  Medication Sig Dispense Refill  . ALPRAZolam (XANAX) 0.25 MG tablet Take 0.125 mg by mouth daily as needed. For anxiety    . cyclobenzaprine (FLEXERIL) 10 MG tablet Take 10 mg by mouth daily as needed for muscle spasms.    Marland Kitchen emollient (BIAFINE) cream Apply topically as needed. 454 g 0  . ibuprofen (ADVIL,MOTRIN) 200 MG tablet Take 400 mg by mouth once.    Marland Kitchen levothyroxine (SYNTHROID, LEVOTHROID) 125 MCG tablet Take 125 mcg by mouth daily.    Marland Kitchen levothyroxine (SYNTHROID, LEVOTHROID) 137 MCG tablet Take 137 mcg by mouth daily.    Marland Kitchen lidocaine (LIDODERM) 5 % Place 1 patch onto the skin daily. Remove & Discard patch within 12 hours or as directed by MD 30 patch 0  . meloxicam (MOBIC) 15 MG tablet Take 15 mg by mouth daily.    . methocarbamol (ROBAXIN) 500 MG tablet Take 500 mg by mouth 2 (two) times daily.    . mirtazapine (REMERON) 15 MG tablet Take 1 tablet (15 mg total) by mouth at bedtime. 30 tablet 3  . ondansetron (ZOFRAN) 4 MG tablet Take 1 tablet (4 mg total) by mouth every 6 (six) hours as needed for nausea. 40 tablet 0  . OxyCODONE (OXYCONTIN) 10 mg T12A 12 hr tablet Take 1 tablet (10 mg total) by mouth every 12 (twelve) hours. 60 tablet  0  . sucralfate (CARAFATE) 1 G tablet Take 1 tablet (1 g total) by mouth 4 (four) times daily. 120 tablet 2  . traMADol (ULTRAM) 50 MG tablet Take 2 tablets (100 mg total) by mouth every 6 (six) hours as needed for moderate pain. 90 tablet 1   No current facility-administered medications for this visit.    PHYSICAL  EXAMINATION: ECOG PERFORMANCE STATUS: 1 - Symptomatic but completely ambulatory  Filed Vitals:   01/07/14 1057  BP: 153/68  Pulse: 59  Temp: 98.2 F (36.8 C)  Resp: 18   Filed Weights   01/07/14 1057  Weight: 222 lb 12.8 oz (101.061 kg)    GENERAL:alert, no distress and comfortable SKIN: skin color, texture, turgor are normal, no rashes or significant lesions EYES: normal, Conjunctiva are pink and non-injected, sclera clear OROPHARYNX:no exudate, no erythema and lips, buccal mucosa, and tongue normal  NECK: supple, thyroid normal size, non-tender, without nodularity LYMPH:  no palpable lymphadenopathy in the cervical, axillary or inguinal LUNGS: clear to auscultation and percussion with normal breathing effort HEART: regular rate & rhythm and no murmurs and no lower extremity edema ABDOMEN:abdomen soft, non-tender and normal bowel sounds Musculoskeletal:no cyanosis of digits and no clubbing  NEURO: alert & oriented x 3 with fluent speech, no focal motor/sensory deficits  LABORATORY DATA:  I have reviewed the data as listed    Component Value Date/Time   NA 142 01/07/2014 1018   NA 142 12/14/2013 0521   K 4.7 01/07/2014 1018   K 4.3 12/14/2013 0521   CL 104 12/14/2013 0521   CO2 24 01/07/2014 1018   CO2 26 12/14/2013 0521   GLUCOSE 116 01/07/2014 1018   GLUCOSE 155* 12/14/2013 0521   BUN 14.6 01/07/2014 1018   BUN 11 12/14/2013 0521   CREATININE 0.7 01/07/2014 1018   CREATININE 0.55 12/14/2013 0521   CALCIUM 9.4 01/07/2014 1018   CALCIUM 9.1 12/14/2013 0521   PROT 6.8 01/07/2014 1018   PROT 7.5 12/13/2013 2018   ALBUMIN 3.3* 01/07/2014 1018   ALBUMIN 3.7 12/13/2013 2018   AST 21 01/07/2014 1018   AST 17 12/13/2013 2018   ALT 33 01/07/2014 1018   ALT 14 12/13/2013 2018   ALKPHOS 77 01/07/2014 1018   ALKPHOS 80 12/13/2013 2018   BILITOT 0.52 01/07/2014 1018   BILITOT 0.3 12/13/2013 2018   GFRNONAA >90 12/14/2013 0521   GFRAA >90 12/14/2013 0521    No  results found for: SPEP, UPEP  Lab Results  Component Value Date   WBC 4.0 01/07/2014   NEUTROABS 2.6 01/07/2014   HGB 13.5 01/07/2014   HCT 41.1 01/07/2014   MCV 92.8 01/07/2014   PLT 166 01/07/2014      Chemistry      Component Value Date/Time   NA 142 01/07/2014 1018   NA 142 12/14/2013 0521   K 4.7 01/07/2014 1018   K 4.3 12/14/2013 0521   CL 104 12/14/2013 0521   CO2 24 01/07/2014 1018   CO2 26 12/14/2013 0521   BUN 14.6 01/07/2014 1018   BUN 11 12/14/2013 0521   CREATININE 0.7 01/07/2014 1018   CREATININE 0.55 12/14/2013 0521      Component Value Date/Time   CALCIUM 9.4 01/07/2014 1018   CALCIUM 9.1 12/14/2013 0521   ALKPHOS 77 01/07/2014 1018   ALKPHOS 80 12/13/2013 2018   AST 21 01/07/2014 1018   AST 17 12/13/2013 2018   ALT 33 01/07/2014 1018   ALT 14 12/13/2013 2018   BILITOT  0.52 01/07/2014 1018   BILITOT 0.3 12/13/2013 2018     ASSESSMENT & PLAN:  Adenocarcinoma of upper lobe of right lung She had minor sensitivity reaction to Taxol last week but managed to complete her treatment on time. We will be careful with chemotherapy tomorrow. If she has further infusion reaction to Taxol again, I might switch her to Abraxane So far she tolerated treatment very well without any other side effects.  Esophagitis Improved with Pepcid. Carafate is added. Recommend she continue to same.     All questions were answered. The patient knows to call the clinic with any problems, questions or concerns. No barriers to learning was detected. I spent 30 minutes counseling the patient face to face. The total time spent in the appointment was 40 minutes and more than 50% was on counseling and review of test results     [facility], [place], MD 01/07/2014 7:47 PM

## 2014-01-07 NOTE — Telephone Encounter (Signed)
Per staff message and POF I have scheduled appts. Advised scheduler of appts. JMW  

## 2014-01-07 NOTE — Assessment & Plan Note (Signed)
Improved with Pepcid. Carafate is added. Recommend she continue to same.

## 2014-01-08 ENCOUNTER — Ambulatory Visit (HOSPITAL_BASED_OUTPATIENT_CLINIC_OR_DEPARTMENT_OTHER): Payer: No Typology Code available for payment source

## 2014-01-08 ENCOUNTER — Ambulatory Visit
Admission: RE | Admit: 2014-01-08 | Discharge: 2014-01-08 | Disposition: A | Discharge status: Home / Self Care | Payer: No Typology Code available for payment source | Source: Ambulatory Visit | Attending: Radiation Oncology | Admitting: Radiation Oncology

## 2014-01-08 ENCOUNTER — Ambulatory Visit: Payer: No Typology Code available for payment source

## 2014-01-08 DIAGNOSIS — C3411 Malignant neoplasm of upper lobe, right bronchus or lung: Secondary | ICD-10-CM

## 2014-01-08 DIAGNOSIS — C7951 Secondary malignant neoplasm of bone: Secondary | ICD-10-CM

## 2014-01-08 DIAGNOSIS — Z510 Encounter for antineoplastic radiation therapy: Secondary | ICD-10-CM | POA: Diagnosis not present

## 2014-01-08 DIAGNOSIS — Z5111 Encounter for antineoplastic chemotherapy: Secondary | ICD-10-CM

## 2014-01-08 MED ORDER — LORAZEPAM 2 MG/ML IJ SOLN
1.0000 mg | Freq: Once | INTRAMUSCULAR | Status: AC
  Administered 2014-01-08: 1 mg via INTRAVENOUS

## 2014-01-08 MED ORDER — ONDANSETRON 16 MG/50ML IVPB (CHCC)
INTRAVENOUS | Status: AC
  Filled 2014-01-08: qty 16

## 2014-01-08 MED ORDER — PACLITAXEL CHEMO INJECTION 300 MG/50ML
45.0000 mg/m2 | Freq: Once | INTRAVENOUS | Status: AC
  Administered 2014-01-08: 102 mg via INTRAVENOUS
  Filled 2014-01-08: qty 17

## 2014-01-08 MED ORDER — ONDANSETRON 16 MG/50ML IVPB (CHCC)
16.0000 mg | Freq: Once | INTRAVENOUS | Status: AC
  Administered 2014-01-08: 16 mg via INTRAVENOUS

## 2014-01-08 MED ORDER — DIPHENHYDRAMINE HCL 50 MG/ML IJ SOLN
INTRAMUSCULAR | Status: AC
Start: 2014-01-08 — End: 2014-01-08
  Filled 2014-01-08: qty 1

## 2014-01-08 MED ORDER — DEXAMETHASONE SODIUM PHOSPHATE 20 MG/5ML IJ SOLN
INTRAMUSCULAR | Status: AC
  Filled 2014-01-08: qty 5

## 2014-01-08 MED ORDER — DEXAMETHASONE SODIUM PHOSPHATE 20 MG/5ML IJ SOLN
20.0000 mg | Freq: Once | INTRAMUSCULAR | Status: AC
  Administered 2014-01-08: 20 mg via INTRAVENOUS

## 2014-01-08 MED ORDER — FAMOTIDINE IN NACL 20-0.9 MG/50ML-% IV SOLN
20.0000 mg | Freq: Once | INTRAVENOUS | Status: AC
  Administered 2014-01-08: 20 mg via INTRAVENOUS

## 2014-01-08 MED ORDER — SODIUM CHLORIDE 0.9 % IV SOLN
290.0000 mg | Freq: Once | INTRAVENOUS | Status: AC
  Administered 2014-01-08: 290 mg via INTRAVENOUS
  Filled 2014-01-08: qty 29

## 2014-01-08 MED ORDER — LORAZEPAM 2 MG/ML IJ SOLN
INTRAMUSCULAR | Status: AC
  Filled 2014-01-08: qty 1

## 2014-01-08 MED ORDER — SODIUM CHLORIDE 0.9 % IV SOLN
Freq: Once | INTRAVENOUS | Status: AC
  Administered 2014-01-08: 14:00:00 via INTRAVENOUS

## 2014-01-08 MED ORDER — DIPHENHYDRAMINE HCL 50 MG/ML IJ SOLN
25.0000 mg | Freq: Once | INTRAMUSCULAR | Status: AC
  Administered 2014-01-08: 25 mg via INTRAVENOUS

## 2014-01-08 NOTE — Patient Instructions (Signed)
Jasmine Grant Discharge Instructions for Patients Receiving Chemotherapy  Today you received the following chemotherapy agents Taxol, Carboplatin  To help prevent nausea and vomiting after your treatment, we encourage you to take your nausea medication as needed   If you develop nausea and vomiting that is not controlled by your nausea medication, call the clinic.   BELOW ARE SYMPTOMS THAT SHOULD BE REPORTED IMMEDIATELY:  *FEVER GREATER THAN 100.5 F  *CHILLS WITH OR WITHOUT FEVER  NAUSEA AND VOMITING THAT IS NOT CONTROLLED WITH YOUR NAUSEA MEDICATION  *UNUSUAL SHORTNESS OF BREATH  *UNUSUAL BRUISING OR BLEEDING  TENDERNESS IN MOUTH AND THROAT WITH OR WITHOUT PRESENCE OF ULCERS  *URINARY PROBLEMS  *BOWEL PROBLEMS  UNUSUAL RASH Items with * indicate a potential emergency and should be followed up as soon as possible.  Feel free to call the clinic you have any questions or concerns. The clinic phone number is [phone].

## 2014-01-11 ENCOUNTER — Ambulatory Visit
Admission: RE | Admit: 2014-01-11 | Discharge: 2014-01-11 | Disposition: A | Discharge status: Home / Self Care | Payer: No Typology Code available for payment source | Source: Ambulatory Visit | Attending: Radiation Oncology | Admitting: Radiation Oncology

## 2014-01-11 DIAGNOSIS — Z510 Encounter for antineoplastic radiation therapy: Secondary | ICD-10-CM | POA: Diagnosis not present

## 2014-01-12 ENCOUNTER — Ambulatory Visit
Admission: RE | Admit: 2014-01-12 | Discharge: 2014-01-12 | Disposition: A | Discharge status: Home / Self Care | Payer: No Typology Code available for payment source | Source: Ambulatory Visit | Attending: Radiation Oncology | Admitting: Radiation Oncology

## 2014-01-12 ENCOUNTER — Encounter: Payer: Self-pay | Admitting: Radiation Oncology

## 2014-01-12 DIAGNOSIS — Z510 Encounter for antineoplastic radiation therapy: Secondary | ICD-10-CM | POA: Diagnosis not present

## 2014-01-12 NOTE — Progress Notes (Signed)
Faxed short term disability paperwork to [place] [phone]) and sent to scan

## 2014-01-13 ENCOUNTER — Ambulatory Visit
Admission: RE | Admit: 2014-01-13 | Discharge: 2014-01-13 | Disposition: A | Discharge status: Home / Self Care | Payer: No Typology Code available for payment source | Source: Ambulatory Visit | Attending: Radiation Oncology | Admitting: Radiation Oncology

## 2014-01-13 DIAGNOSIS — Z510 Encounter for antineoplastic radiation therapy: Secondary | ICD-10-CM | POA: Diagnosis not present

## 2014-01-14 ENCOUNTER — Ambulatory Visit (HOSPITAL_BASED_OUTPATIENT_CLINIC_OR_DEPARTMENT_OTHER): Payer: No Typology Code available for payment source | Admitting: Hematology and Oncology

## 2014-01-14 ENCOUNTER — Telehealth: Payer: Self-pay | Admitting: *Deleted

## 2014-01-14 ENCOUNTER — Ambulatory Visit
Admission: RE | Admit: 2014-01-14 | Discharge: 2014-01-14 | Disposition: A | Discharge status: Home / Self Care | Payer: No Typology Code available for payment source | Source: Ambulatory Visit | Attending: Radiation Oncology | Admitting: Radiation Oncology

## 2014-01-14 ENCOUNTER — Encounter: Payer: Self-pay | Admitting: Hematology and Oncology

## 2014-01-14 ENCOUNTER — Other Ambulatory Visit: Payer: No Typology Code available for payment source

## 2014-01-14 ENCOUNTER — Other Ambulatory Visit (HOSPITAL_BASED_OUTPATIENT_CLINIC_OR_DEPARTMENT_OTHER): Payer: No Typology Code available for payment source

## 2014-01-14 ENCOUNTER — Telehealth: Payer: Self-pay | Admitting: Hematology and Oncology

## 2014-01-14 VITALS — BP 155/68 | HR 79 | Temp 98.2°F | Resp 18 | Ht 74.0 in | Wt 225.4 lb

## 2014-01-14 DIAGNOSIS — C3411 Malignant neoplasm of upper lobe, right bronchus or lung: Secondary | ICD-10-CM

## 2014-01-14 DIAGNOSIS — Z510 Encounter for antineoplastic radiation therapy: Secondary | ICD-10-CM | POA: Diagnosis not present

## 2014-01-14 DIAGNOSIS — K209 Esophagitis, unspecified without bleeding: Secondary | ICD-10-CM

## 2014-01-14 DIAGNOSIS — C7951 Secondary malignant neoplasm of bone: Secondary | ICD-10-CM

## 2014-01-14 LAB — CBC WITH DIFFERENTIAL/PLATELET
BASO%: 0.2 % (ref 0.0–2.0)
Basophils Absolute: 0 10*3/uL (ref 0.0–0.1)
EOS%: 1.8 % (ref 0.0–7.0)
Eosinophils Absolute: 0.1 10*3/uL (ref 0.0–0.5)
HEMATOCRIT: 40.2 % (ref 34.8–46.6)
HGB: 13.3 g/dL (ref 11.6–15.9)
LYMPH%: 18 % (ref 14.0–49.7)
MCH: 30.8 pg (ref 25.1–34.0)
MCHC: 33.1 g/dL (ref 31.5–36.0)
MCV: 93.1 fL (ref 79.5–101.0)
MONO#: 0.4 10*3/uL (ref 0.1–0.9)
MONO%: 8.9 % (ref 0.0–14.0)
NEUT#: 3.2 10*3/uL (ref 1.5–6.5)
NEUT%: 71.1 % (ref 38.4–76.8)
PLATELETS: 204 10*3/uL (ref 145–400)
RBC: 4.32 10*6/uL (ref 3.70–5.45)
RDW: 13.1 % (ref 11.2–14.5)
WBC: 4.5 10*3/uL (ref 3.9–10.3)
lymph#: 0.8 10*3/uL — ABNORMAL LOW (ref 0.9–3.3)

## 2014-01-14 LAB — COMPREHENSIVE METABOLIC PANEL (CC13)
ALK PHOS: 76 U/L (ref 40–150)
ALT: 27 U/L (ref 0–55)
AST: 18 U/L (ref 5–34)
Albumin: 3.2 g/dL — ABNORMAL LOW (ref 3.5–5.0)
Anion Gap: 12 mEq/L — ABNORMAL HIGH (ref 3–11)
BUN: 12 mg/dL (ref 7.0–26.0)
CO2: 25 meq/L (ref 22–29)
CREATININE: 0.7 mg/dL (ref 0.6–1.1)
Calcium: 9 mg/dL (ref 8.4–10.4)
Chloride: 106 mEq/L (ref 98–109)
GLUCOSE: 133 mg/dL (ref 70–140)
Potassium: 4 mEq/L (ref 3.5–5.1)
Sodium: 143 mEq/L (ref 136–145)
TOTAL PROTEIN: 6.9 g/dL (ref 6.4–8.3)
Total Bilirubin: 0.28 mg/dL (ref 0.20–1.20)

## 2014-01-14 NOTE — Progress Notes (Signed)
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   12/13/2013 Imaging CT scan show large mass in the right lung apex likely representing primary lung carcinoma or large metastasis. Metastases demonstrated in the pretracheal lymph nodes, T1 vertebral body and possibly sacrum.   12/14/2013 Initial Diagnosis Adenocarcinoma of upper lobe of right lung   12/14/2013 Pathology Results Accession: [id number] biopsy from right upper lobe showed adenocarcinoma   12/17/2013 Imaging MRI of the head is negative.   12/23/2013 -  Radiation Therapy She received radiation therapy to the mediastinal mass and bone   12/28/2013 Imaging PET/CT scan showed mediastinal mass, lymphadenopathy and T1 involvement.   01/01/2014 -  Chemotherapy Weekly carboplatin and Taxol were added    INTERVAL HISTORY: Please see below for problem oriented charting. She is seen prior to cycle 3 of treatment. She tolerated last week treatment well. Denies any side effects of treatment. Her weakness in her arm and hand is improving  REVIEW OF SYSTEMS:   Constitutional: Denies fevers, chills or abnormal weight loss Eyes: Denies blurriness of vision Ears, nose, mouth, throat, and face: Denies mucositis or sore throat Respiratory: Denies cough, dyspnea or wheezes Cardiovascular: Denies palpitation, chest discomfort or lower extremity swelling Gastrointestinal:  Denies nausea, heartburn or change in bowel habits Skin: Denies abnormal skin rashes Lymphatics: Denies new lymphadenopathy or easy bruising Neurological:Denies numbness, tingling or new  weaknesses Behavioral/Psych: Mood is stable, no new changes  All other systems were reviewed with the patient and are negative.  I have reviewed the past medical history, past surgical history, social history and family history with the patient and they are unchanged from previous note.  ALLERGIES:  is allergic to penicillins.  MEDICATIONS:  Current Outpatient Prescriptions  Medication Sig Dispense Refill  . ALPRAZolam (XANAX) 0.25 MG tablet Take 0.125 mg by mouth daily as needed. For anxiety    . cyclobenzaprine (FLEXERIL) 10 MG tablet Take 10 mg by mouth daily as needed for muscle spasms.    Marland Kitchen emollient (BIAFINE) cream Apply topically as needed. 454 g 0  . ibuprofen (ADVIL,MOTRIN) 200 MG tablet Take 400 mg by mouth once.    Marland Kitchen levothyroxine (SYNTHROID, LEVOTHROID) 125 MCG tablet Take 125 mcg by mouth daily.    Marland Kitchen levothyroxine (SYNTHROID, LEVOTHROID) 137 MCG tablet Take 137 mcg by mouth daily.    Marland Kitchen lidocaine (LIDODERM) 5 % Place 1 patch onto the skin daily. Remove & Discard patch within 12 hours or as directed by MD 30 patch 0  . meloxicam (MOBIC) 15 MG tablet Take 15 mg by mouth daily.    . methocarbamol (ROBAXIN) 500 MG tablet Take 500 mg by mouth 2 (two) times daily.    . mirtazapine (REMERON) 15 MG tablet Take 1 tablet (15 mg total) by mouth at bedtime. 30 tablet 3  . ondansetron (ZOFRAN) 4 MG tablet Take 1 tablet (4 mg total) by mouth every 6 (six) hours as needed for nausea. 40 tablet 0  . OxyCODONE (OXYCONTIN) 10 mg T12A 12 hr tablet Take 1 tablet (10 mg total) by mouth every 12 (twelve) hours. 60 tablet 0  . sucralfate (CARAFATE) 1 G tablet Take  1 tablet (1 g total) by mouth 4 (four) times daily. 120 tablet 2  . traMADol (ULTRAM) 50 MG tablet Take 2 tablets (100 mg total) by mouth every 6 (six) hours as needed for moderate pain. 90 tablet 1   No current facility-administered medications for this visit.    PHYSICAL EXAMINATION: ECOG PERFORMANCE STATUS: 0 - Asymptomatic  Filed  Vitals:   01/14/14 1433  BP: 155/68  Pulse: 79  Temp: 98.2 F (36.8 C)  Resp: 18   Filed Weights   01/14/14 1433  Weight: 225 lb 6.4 oz (102.241 kg)    GENERAL:alert, no distress and comfortable SKIN: skin color, texture, turgor are normal, no rashes or significant lesions EYES: normal, Conjunctiva are pink and non-injected, sclera clear OROPHARYNX:no exudate, no erythema and lips, buccal mucosa, and tongue normal  NECK: supple, thyroid normal size, non-tender, without nodularity LYMPH:  no palpable lymphadenopathy in the cervical, axillary or inguinal LUNGS: clear to auscultation and percussion with normal breathing effort HEART: regular rate & rhythm and no murmurs and no lower extremity edema ABDOMEN:abdomen soft, non-tender and normal bowel sounds Musculoskeletal:no cyanosis of digits and no clubbing  NEURO: alert & oriented x 3 with fluent speech, no focal motor/sensory deficits  LABORATORY DATA:  I have reviewed the data as listed    Component Value Date/Time   NA 142 01/07/2014 1018   NA 142 12/14/2013 0521   K 4.7 01/07/2014 1018   K 4.3 12/14/2013 0521   CL 104 12/14/2013 0521   CO2 24 01/07/2014 1018   CO2 26 12/14/2013 0521   GLUCOSE 116 01/07/2014 1018   GLUCOSE 155* 12/14/2013 0521   BUN 14.6 01/07/2014 1018   BUN 11 12/14/2013 0521   CREATININE 0.7 01/07/2014 1018   CREATININE 0.55 12/14/2013 0521   CALCIUM 9.4 01/07/2014 1018   CALCIUM 9.1 12/14/2013 0521   PROT 6.8 01/07/2014 1018   PROT 7.5 12/13/2013 2018   ALBUMIN 3.3* 01/07/2014 1018   ALBUMIN 3.7 12/13/2013 2018   AST 21 01/07/2014 1018   AST 17 12/13/2013 2018   ALT 33 01/07/2014 1018   ALT 14 12/13/2013 2018   ALKPHOS 77 01/07/2014 1018   ALKPHOS 80 12/13/2013 2018   BILITOT 0.52 01/07/2014 1018   BILITOT 0.3 12/13/2013 2018   GFRNONAA >90 12/14/2013 0521   GFRAA >90 12/14/2013 0521    No results found for: SPEP, UPEP  Lab Results  Component Value Date   WBC 4.5 01/14/2014    NEUTROABS 3.2 01/14/2014   HGB 13.3 01/14/2014   HCT 40.2 01/14/2014   MCV 93.1 01/14/2014   PLT 204 01/14/2014      Chemistry      Component Value Date/Time   NA 142 01/07/2014 1018   NA 142 12/14/2013 0521   K 4.7 01/07/2014 1018   K 4.3 12/14/2013 0521   CL 104 12/14/2013 0521   CO2 24 01/07/2014 1018   CO2 26 12/14/2013 0521   BUN 14.6 01/07/2014 1018   BUN 11 12/14/2013 0521   CREATININE 0.7 01/07/2014 1018   CREATININE 0.55 12/14/2013 0521      Component Value Date/Time   CALCIUM 9.4 01/07/2014 1018   CALCIUM 9.1 12/14/2013 0521   ALKPHOS 77 01/07/2014 1018   ALKPHOS 80 12/13/2013 2018   AST 21 01/07/2014 1018   AST 17 12/13/2013 2018   ALT 33 01/07/2014 1018   ALT 14 12/13/2013 2018   BILITOT 0.52 01/07/2014 1018   BILITOT 0.3 12/13/2013 2018  ASSESSMENT & PLAN:  Adenocarcinoma of upper lobe of right lung She had minor sensitivity reaction to Taxol last week but managed to complete her treatment on time. She tolerated cycle 2 of treatment well without side effects. We will continue treatment without dose adjustment or changes  Esophagitis Improved with Pepcid. Carafate is added. Recommend she continue to same.   No orders of the defined types were placed in this encounter.   All questions were answered. The patient knows to call the clinic with any problems, questions or concerns. No barriers to learning was detected. I spent 25 minutes counseling the patient face to face. The total time spent in the appointment was 30 minutes and more than 50% was on counseling and review of test results     [facility], [place], MD 01/14/2014 2:57 PM

## 2014-01-14 NOTE — Telephone Encounter (Signed)
Per staff phone call and POF I have schedueld appts. Scheduler advised of appts.  JMW  

## 2014-01-14 NOTE — Assessment & Plan Note (Signed)
Improved with Pepcid. Carafate is added. Recommend she continue to same.

## 2014-01-14 NOTE — Assessment & Plan Note (Signed)
She had minor sensitivity reaction to Taxol last week but managed to complete her treatment on time. She tolerated cycle 2 of treatment well without side effects. We will continue treatment without dose adjustment or changes

## 2014-01-14 NOTE — Telephone Encounter (Signed)
, °

## 2014-01-15 ENCOUNTER — Ambulatory Visit
Admission: RE | Admit: 2014-01-15 | Discharge: 2014-01-15 | Disposition: A | Discharge status: Home / Self Care | Payer: No Typology Code available for payment source | Source: Ambulatory Visit | Attending: Radiation Oncology | Admitting: Radiation Oncology

## 2014-01-15 ENCOUNTER — Encounter: Payer: Self-pay | Admitting: Radiation Oncology

## 2014-01-15 ENCOUNTER — Ambulatory Visit (HOSPITAL_BASED_OUTPATIENT_CLINIC_OR_DEPARTMENT_OTHER): Payer: No Typology Code available for payment source

## 2014-01-15 ENCOUNTER — Other Ambulatory Visit: Payer: No Typology Code available for payment source

## 2014-01-15 VITALS — BP 162/75 | HR 70 | Temp 98.2°F | Resp 20 | Wt 224.0 lb

## 2014-01-15 DIAGNOSIS — Z510 Encounter for antineoplastic radiation therapy: Secondary | ICD-10-CM | POA: Diagnosis not present

## 2014-01-15 DIAGNOSIS — C3411 Malignant neoplasm of upper lobe, right bronchus or lung: Secondary | ICD-10-CM

## 2014-01-15 DIAGNOSIS — Z5111 Encounter for antineoplastic chemotherapy: Secondary | ICD-10-CM

## 2014-01-15 MED ORDER — LORAZEPAM 2 MG/ML IJ SOLN
INTRAMUSCULAR | Status: AC
Start: 2014-01-15 — End: 2014-01-15
  Filled 2014-01-15: qty 1

## 2014-01-15 MED ORDER — DIPHENHYDRAMINE HCL 50 MG/ML IJ SOLN
INTRAMUSCULAR | Status: AC
  Filled 2014-01-15: qty 1

## 2014-01-15 MED ORDER — PACLITAXEL CHEMO INJECTION 300 MG/50ML
45.0000 mg/m2 | Freq: Once | INTRAVENOUS | Status: AC
  Administered 2014-01-15: 102 mg via INTRAVENOUS
  Filled 2014-01-15: qty 17

## 2014-01-15 MED ORDER — DEXAMETHASONE SODIUM PHOSPHATE 20 MG/5ML IJ SOLN
INTRAMUSCULAR | Status: AC
  Filled 2014-01-15: qty 5

## 2014-01-15 MED ORDER — DIPHENHYDRAMINE HCL 50 MG/ML IJ SOLN
25.0000 mg | Freq: Once | INTRAMUSCULAR | Status: AC
  Administered 2014-01-15: 25 mg via INTRAVENOUS

## 2014-01-15 MED ORDER — FAMOTIDINE IN NACL 20-0.9 MG/50ML-% IV SOLN
INTRAVENOUS | Status: AC
  Filled 2014-01-15: qty 50

## 2014-01-15 MED ORDER — SODIUM CHLORIDE 0.9 % IV SOLN
Freq: Once | INTRAVENOUS | Status: AC
  Administered 2014-01-15: 13:00:00 via INTRAVENOUS

## 2014-01-15 MED ORDER — CARBOPLATIN CHEMO INJECTION 450 MG/45ML
290.0000 mg | Freq: Once | INTRAVENOUS | Status: AC
  Administered 2014-01-15: 290 mg via INTRAVENOUS
  Filled 2014-01-15: qty 29

## 2014-01-15 MED ORDER — ONDANSETRON 16 MG/50ML IVPB (CHCC)
INTRAVENOUS | Status: AC
  Filled 2014-01-15: qty 16

## 2014-01-15 MED ORDER — LORAZEPAM 2 MG/ML IJ SOLN
1.0000 mg | Freq: Once | INTRAMUSCULAR | Status: AC
  Administered 2014-01-15: 1 mg via INTRAVENOUS

## 2014-01-15 MED ORDER — ONDANSETRON 16 MG/50ML IVPB (CHCC)
16.0000 mg | Freq: Once | INTRAVENOUS | Status: AC
  Administered 2014-01-15: 16 mg via INTRAVENOUS

## 2014-01-15 MED ORDER — DEXAMETHASONE SODIUM PHOSPHATE 20 MG/5ML IJ SOLN
20.0000 mg | Freq: Once | INTRAMUSCULAR | Status: AC
  Administered 2014-01-15: 20 mg via INTRAVENOUS

## 2014-01-15 MED ORDER — FAMOTIDINE IN NACL 20-0.9 MG/50ML-% IV SOLN
20.0000 mg | Freq: Once | INTRAVENOUS | Status: AC
  Administered 2014-01-15: 20 mg via INTRAVENOUS

## 2014-01-15 NOTE — Progress Notes (Addendum)
Patient denies pain, fatigue, loss of appetite. She has a dry cough, no SOB. She states the Carafate is helpful with swallowing, but she does not use regularly. She states she chews her food "really really well", and that is very helpful. She takes Pepcid at night which is helpful to her. She is applying lotion to her right chest for pinkness, denies itching or skin irritation. Her voice is slightly hoarse.

## 2014-01-15 NOTE — Progress Notes (Signed)
Department of Radiation Oncology  Phone:  [phone] Fax:        [phone]  Weekly Treatment Note    Name: Jasmine Grant Date: 01/15/2014 MRN: [id number] DOB: 05-28-1955   Current dose: 40 Gy  Current fraction: 20   MEDICATIONS: Current Outpatient Prescriptions  Medication Sig Dispense Refill  . ALPRAZolam (XANAX) 0.25 MG tablet Take 0.125 mg by mouth daily as needed. For anxiety    . cyclobenzaprine (FLEXERIL) 10 MG tablet Take 10 mg by mouth daily as needed for muscle spasms.    Marland Kitchen emollient (BIAFINE) cream Apply topically as needed. 454 g 0  . ibuprofen (ADVIL,MOTRIN) 200 MG tablet Take 400 mg by mouth once.    Marland Kitchen levothyroxine (SYNTHROID, LEVOTHROID) 125 MCG tablet Take 125 mcg by mouth daily.    Marland Kitchen levothyroxine (SYNTHROID, LEVOTHROID) 137 MCG tablet Take 137 mcg by mouth daily.    Marland Kitchen lidocaine (LIDODERM) 5 % Place 1 patch onto the skin daily. Remove & Discard patch within 12 hours or as directed by MD 30 patch 0  . meloxicam (MOBIC) 15 MG tablet Take 15 mg by mouth daily.    . methocarbamol (ROBAXIN) 500 MG tablet Take 500 mg by mouth 2 (two) times daily.    . mirtazapine (REMERON) 15 MG tablet Take 1 tablet (15 mg total) by mouth at bedtime. 30 tablet 3  . ondansetron (ZOFRAN) 4 MG tablet Take 1 tablet (4 mg total) by mouth every 6 (six) hours as needed for nausea. 40 tablet 0  . OxyCODONE (OXYCONTIN) 10 mg T12A 12 hr tablet Take 1 tablet (10 mg total) by mouth every 12 (twelve) hours. 60 tablet 0  . sucralfate (CARAFATE) 1 G tablet Take 1 tablet (1 g total) by mouth 4 (four) times daily. 120 tablet 2  . traMADol (ULTRAM) 50 MG tablet Take 2 tablets (100 mg total) by mouth every 6 (six) hours as needed for moderate pain. 90 tablet 1   No current facility-administered medications for this encounter.   Facility-Administered Medications Ordered in Other Encounters  Medication Dose Route Frequency Provider Last Rate Last Dose  . CARBOplatin (PARAPLATIN) 290 mg in  sodium chloride 0.9 % 100 mL chemo infusion  290 mg Intravenous Once [name], MD 258 mL/hr at 01/15/14 1505 290 mg at 01/15/14 1505     ALLERGIES: Penicillins   LABORATORY DATA:  Lab Results  Component Value Date   WBC 4.5 01/14/2014   HGB 13.3 01/14/2014   HCT 40.2 01/14/2014   MCV 93.1 01/14/2014   PLT 204 01/14/2014   Lab Results  Component Value Date   NA 143 01/14/2014   K 4.0 01/14/2014   CL 104 12/14/2013   CO2 25 01/14/2014   Lab Results  Component Value Date   ALT 27 01/14/2014   AST 18 01/14/2014   ALKPHOS 76 01/14/2014   BILITOT 0.28 01/14/2014     NARRATIVE: Jasmine Grant was seen today for weekly treatment management. The chart was checked and the patient's films were reviewed.  Patient denies pain, fatigue, loss of appetite. She has a dry cough, no SOB. She states the Carafate is helpful with swallowing, but she does not use regularly. She states she chews her food "really really well", and that is very helpful. She takes Pepcid at night which is helpful to her. She is applying lotion to her right chest for pinkness, denies itching or skin irritation. Her voice is slightly hoarse.  PHYSICAL EXAMINATION: weight is 224 lb (101.606  kg). Her oral temperature is 98.2 F (36.8 C). Her blood pressure is 162/75 and her pulse is 70. Her respiration is 20 and oxygen saturation is 99%.        ASSESSMENT: The patient is doing satisfactorily with treatment.  PLAN: We will continue with the patient's radiation treatment as planned.

## 2014-01-15 NOTE — Patient Instructions (Signed)
Carboplatin injection  What is this medicine?  CARBOPLATIN (KAR boe pla tin) is a chemotherapy drug. It targets fast dividing cells, like cancer cells, and causes these cells to die. This medicine is used to treat ovarian cancer and many other cancers.  This medicine may be used for other purposes; ask your health care provider or pharmacist if you have questions.  COMMON BRAND NAME(S): Paraplatin  What should I tell my health care provider before I take this medicine?  They need to know if you have any of these conditions:  -blood disorders  -hearing problems  -kidney disease  -recent or ongoing radiation therapy  -an unusual or allergic reaction to carboplatin, cisplatin, other chemotherapy, other medicines, foods, dyes, or preservatives  -pregnant or trying to get pregnant  -breast-feeding  How should I use this medicine?  This drug is usually given as an infusion into a vein. It is administered in a hospital or clinic by a specially trained health care professional.  Talk to your pediatrician regarding the use of this medicine in children. Special care may be needed.  Overdosage: If you think you have taken too much of this medicine contact a poison control center or emergency room at once.  NOTE: This medicine is only for you. Do not share this medicine with others.  What if I miss a dose?  It is important not to miss a dose. Call your doctor or health care professional if you are unable to keep an appointment.  What may interact with this medicine?  -medicines for seizures  -medicines to increase blood counts like filgrastim, pegfilgrastim, sargramostim  -some antibiotics like amikacin, gentamicin, neomycin, streptomycin, tobramycin  -vaccines  Talk to your doctor or health care professional before taking any of these medicines:  -acetaminophen  -aspirin  -ibuprofen  -ketoprofen  -naproxen  This list may not describe all possible interactions. Give your health care provider a list of all the medicines, herbs,  non-prescription drugs, or dietary supplements you use. Also tell them if you smoke, drink alcohol, or use illegal drugs. Some items may interact with your medicine.  What should I watch for while using this medicine?  Your condition will be monitored carefully while you are receiving this medicine. You will need important blood work done while you are taking this medicine.  This drug may make you feel generally unwell. This is not uncommon, as chemotherapy can affect healthy cells as well as cancer cells. Report any side effects. Continue your course of treatment even though you feel ill unless your doctor tells you to stop.  In some cases, you may be given additional medicines to help with side effects. Follow all directions for their use.  Call your doctor or health care professional for advice if you get a fever, chills or sore throat, or other symptoms of a cold or flu. Do not treat yourself. This drug decreases your body's ability to fight infections. Try to avoid being around people who are sick.  This medicine may increase your risk to bruise or bleed. Call your doctor or health care professional if you notice any unusual bleeding.  Be careful brushing and flossing your teeth or using a toothpick because you may get an infection or bleed more easily. If you have any dental work done, tell your dentist you are receiving this medicine.  Avoid taking products that contain aspirin, acetaminophen, ibuprofen, naproxen, or ketoprofen unless instructed by your doctor. These medicines may hide a fever.  Do not become   pregnant while taking this medicine. Women should inform their doctor if they wish to become pregnant or think they might be pregnant. There is a potential for serious side effects to an unborn child. Talk to your health care professional or pharmacist for more information. Do not breast-feed an infant while taking this medicine.  What side effects may I notice from receiving this medicine?  Side effects  that you should report to your doctor or health care professional as soon as possible:  -allergic reactions like skin rash, itching or hives, swelling of the face, lips, or tongue  -signs of infection - fever or chills, cough, sore throat, pain or difficulty passing urine  -signs of decreased platelets or bleeding - bruising, pinpoint red spots on the skin, black, tarry stools, nosebleeds  -signs of decreased red blood cells - unusually weak or tired, fainting spells, lightheadedness  -breathing problems  -changes in hearing  -changes in vision  -chest pain  -high blood pressure  -low blood counts - This drug may decrease the number of white blood cells, red blood cells and platelets. You may be at increased risk for infections and bleeding.  -nausea and vomiting  -pain, swelling, redness or irritation at the injection site  -pain, tingling, numbness in the hands or feet  -problems with balance, talking, walking  -trouble passing urine or change in the amount of urine  Side effects that usually do not require medical attention (report to your doctor or health care professional if they continue or are bothersome):  -hair loss  -loss of appetite  -metallic taste in the mouth or changes in taste  This list may not describe all possible side effects. Call your doctor for medical advice about side effects. You may report side effects to FDA at 1-800-FDA-1088.  Where should I keep my medicine?  This drug is given in a hospital or clinic and will not be stored at home.  NOTE: This sheet is a summary. It may not cover all possible information. If you have questions about this medicine, talk to your doctor, pharmacist, or health care provider.   2015, Elsevier/Gold Standard. (2007-04-22 14:38:05)  Paclitaxel injection  What is this medicine?  PACLITAXEL (PAK li TAX el) is a chemotherapy drug. It targets fast dividing cells, like cancer cells, and causes these cells to die. This medicine is used to treat ovarian cancer,  breast cancer, and other cancers.  This medicine may be used for other purposes; ask your health care provider or pharmacist if you have questions.  COMMON BRAND NAME(S): Onxol, Taxol  What should I tell my health care provider before I take this medicine?  They need to know if you have any of these conditions:  -blood disorders  -irregular heartbeat  -infection (especially a virus infection such as chickenpox, cold sores, or herpes)  -liver disease  -previous or ongoing radiation therapy  -an unusual or allergic reaction to paclitaxel, alcohol, polyoxyethylated castor oil, other chemotherapy agents, other medicines, foods, dyes, or preservatives  -pregnant or trying to get pregnant  -breast-feeding  How should I use this medicine?  This drug is given as an infusion into a vein. It is administered in a hospital or clinic by a specially trained health care professional.  Talk to your pediatrician regarding the use of this medicine in children. Special care may be needed.  Overdosage: If you think you have taken too much of this medicine contact a poison control center or emergency room at once.  NOTE:   This medicine is only for you. Do not share this medicine with others.  What if I miss a dose?  It is important not to miss your dose. Call your doctor or health care professional if you are unable to keep an appointment.  What may interact with this medicine?  Do not take this medicine with any of the following medications:  -disulfiram  -metronidazole  This medicine may also interact with the following medications:  -cyclosporine  -diazepam  -ketoconazole  -medicines to increase blood counts like filgrastim, pegfilgrastim, sargramostim  -other chemotherapy drugs like cisplatin, doxorubicin, epirubicin, etoposide, teniposide, vincristine  -quinidine  -testosterone  -vaccines  -verapamil  Talk to your doctor or health care professional before taking any of these  medicines:  -acetaminophen  -aspirin  -ibuprofen  -ketoprofen  -naproxen  This list may not describe all possible interactions. Give your health care provider a list of all the medicines, herbs, non-prescription drugs, or dietary supplements you use. Also tell them if you smoke, drink alcohol, or use illegal drugs. Some items may interact with your medicine.  What should I watch for while using this medicine?  Your condition will be monitored carefully while you are receiving this medicine. You will need important blood work done while you are taking this medicine.  This drug may make you feel generally unwell. This is not uncommon, as chemotherapy can affect healthy cells as well as cancer cells. Report any side effects. Continue your course of treatment even though you feel ill unless your doctor tells you to stop.  In some cases, you may be given additional medicines to help with side effects. Follow all directions for their use.  Call your doctor or health care professional for advice if you get a fever, chills or sore throat, or other symptoms of a cold or flu. Do not treat yourself. This drug decreases your body's ability to fight infections. Try to avoid being around people who are sick.  This medicine may increase your risk to bruise or bleed. Call your doctor or health care professional if you notice any unusual bleeding.  Be careful brushing and flossing your teeth or using a toothpick because you may get an infection or bleed more easily. If you have any dental work done, tell your dentist you are receiving this medicine.  Avoid taking products that contain aspirin, acetaminophen, ibuprofen, naproxen, or ketoprofen unless instructed by your doctor. These medicines may hide a fever.  Do not become pregnant while taking this medicine. Women should inform their doctor if they wish to become pregnant or think they might be pregnant. There is a potential for serious side effects to an unborn child. Talk to  your health care professional or pharmacist for more information. Do not breast-feed an infant while taking this medicine.  Men are advised not to father a child while receiving this medicine.  What side effects may I notice from receiving this medicine?  Side effects that you should report to your doctor or health care professional as soon as possible:  -allergic reactions like skin rash, itching or hives, swelling of the face, lips, or tongue  -low blood counts - This drug may decrease the number of white blood cells, red blood cells and platelets. You may be at increased risk for infections and bleeding.  -signs of infection - fever or chills, cough, sore throat, pain or difficulty passing urine  -signs of decreased platelets or bleeding - bruising, pinpoint red spots on the skin, black, tarry   stools, nosebleeds  -signs of decreased red blood cells - unusually weak or tired, fainting spells, lightheadedness  -breathing problems  -chest pain  -high or low blood pressure  -mouth sores  -nausea and vomiting  -pain, swelling, redness or irritation at the injection site  -pain, tingling, numbness in the hands or feet  -slow or irregular heartbeat  -swelling of the ankle, feet, hands  Side effects that usually do not require medical attention (report to your doctor or health care professional if they continue or are bothersome):  -bone pain  -complete hair loss including hair on your head, underarms, pubic hair, eyebrows, and eyelashes  -changes in the color of fingernails  -diarrhea  -loosening of the fingernails  -loss of appetite  -muscle or joint pain  -red flush to skin  -sweating  This list may not describe all possible side effects. Call your doctor for medical advice about side effects. You may report side effects to FDA at 1-800-FDA-1088.  Where should I keep my medicine?  This drug is given in a hospital or clinic and will not be stored at home.  NOTE: This sheet is a summary. It may not cover all possible  information. If you have questions about this medicine, talk to your doctor, pharmacist, or health care provider.   2015, Elsevier/Gold Standard. (2012-03-10 16:41:21)

## 2014-01-18 ENCOUNTER — Ambulatory Visit
Admission: RE | Admit: 2014-01-18 | Discharge: 2014-01-18 | Disposition: A | Discharge status: Home / Self Care | Payer: No Typology Code available for payment source | Source: Ambulatory Visit | Attending: Radiation Oncology | Admitting: Radiation Oncology

## 2014-01-18 DIAGNOSIS — Z510 Encounter for antineoplastic radiation therapy: Secondary | ICD-10-CM | POA: Diagnosis not present

## 2014-01-19 ENCOUNTER — Ambulatory Visit
Admission: RE | Admit: 2014-01-19 | Discharge: 2014-01-19 | Disposition: A | Discharge status: Home / Self Care | Payer: No Typology Code available for payment source | Source: Ambulatory Visit | Attending: Radiation Oncology | Admitting: Radiation Oncology

## 2014-01-19 DIAGNOSIS — Z510 Encounter for antineoplastic radiation therapy: Secondary | ICD-10-CM | POA: Diagnosis not present

## 2014-01-20 ENCOUNTER — Encounter: Payer: Self-pay | Admitting: Radiation Oncology

## 2014-01-20 ENCOUNTER — Ambulatory Visit
Admission: RE | Admit: 2014-01-20 | Discharge: 2014-01-20 | Disposition: A | Discharge status: Home / Self Care | Payer: No Typology Code available for payment source | Source: Ambulatory Visit | Attending: Radiation Oncology | Admitting: Radiation Oncology

## 2014-01-20 VITALS — BP 151/57 | HR 68 | Temp 97.6°F | Resp 20 | Wt 223.7 lb

## 2014-01-20 DIAGNOSIS — Z510 Encounter for antineoplastic radiation therapy: Secondary | ICD-10-CM | POA: Diagnosis not present

## 2014-01-20 DIAGNOSIS — C3411 Malignant neoplasm of upper lobe, right bronchus or lung: Secondary | ICD-10-CM

## 2014-01-20 MED ORDER — MAGIC MOUTHWASH W/LIDOCAINE: 5.0000 mL | Freq: Four times a day (QID) | ORAL | Status: DC | PRN

## 2014-01-20 NOTE — Progress Notes (Signed)
Department of Radiation Oncology  Phone:  [phone] Fax:        [phone]  Weekly Treatment Note    Name: SAVINA OLSHEFSKI Date: 01/20/2014 MRN: [id number] DOB: [birth year]   Current dose: 46 Gy  Current fraction: 23   MEDICATIONS: Current Outpatient Prescriptions  Medication Sig Dispense Refill  . ALPRAZolam (XANAX) 0.25 MG tablet Take 0.125 mg by mouth daily as needed. For anxiety    . cyclobenzaprine (FLEXERIL) 10 MG tablet Take 10 mg by mouth daily as needed for muscle spasms.    Marland Kitchen emollient (BIAFINE) cream Apply topically as needed. 454 g 0  . ibuprofen (ADVIL,MOTRIN) 200 MG tablet Take 400 mg by mouth once.    Marland Kitchen levothyroxine (SYNTHROID, LEVOTHROID) 125 MCG tablet Take 125 mcg by mouth daily.    Marland Kitchen levothyroxine (SYNTHROID, LEVOTHROID) 137 MCG tablet Take 137 mcg by mouth daily.    Marland Kitchen lidocaine (LIDODERM) 5 % Place 1 patch onto the skin daily. Remove & Discard patch within 12 hours or as directed by MD 30 patch 0  . meloxicam (MOBIC) 15 MG tablet Take 15 mg by mouth daily.    . methocarbamol (ROBAXIN) 500 MG tablet Take 500 mg by mouth 2 (two) times daily.    . mirtazapine (REMERON) 15 MG tablet Take 1 tablet (15 mg total) by mouth at bedtime. 30 tablet 3  . ondansetron (ZOFRAN) 4 MG tablet Take 1 tablet (4 mg total) by mouth every 6 (six) hours as needed for nausea. 40 tablet 0  . OxyCODONE (OXYCONTIN) 10 mg T12A 12 hr tablet Take 1 tablet (10 mg total) by mouth every 12 (twelve) hours. 60 tablet 0  . sucralfate (CARAFATE) 1 G tablet Take 1 tablet (1 g total) by mouth 4 (four) times daily. 120 tablet 2  . traMADol (ULTRAM) 50 MG tablet Take 2 tablets (100 mg total) by mouth every 6 (six) hours as needed for moderate pain. 90 tablet 1  . Alum & Mag Hydroxide-Simeth (MAGIC MOUTHWASH W/LIDOCAINE) SOLN Take 5 mLs by mouth 4 (four) times daily as needed for mouth pain. 500 mL 1   No current facility-administered medications for this encounter.     ALLERGIES:  Penicillins   LABORATORY DATA:  Lab Results  Component Value Date   WBC 4.5 01/14/2014   HGB 13.3 01/14/2014   HCT 40.2 01/14/2014   MCV 93.1 01/14/2014   PLT 204 01/14/2014   Lab Results  Component Value Date   NA 143 01/14/2014   K 4.0 01/14/2014   CL 104 12/14/2013   CO2 25 01/14/2014   Lab Results  Component Value Date   ALT 27 01/14/2014   AST 18 01/14/2014   ALKPHOS 76 01/14/2014   BILITOT 0.28 01/14/2014     NARRATIVE: [name] was seen today for weekly treatment management. The chart was checked and the patient's films were reviewed.  Weekly rad txs , 23/33 completed, dermatiditis on chest/subclavain ation treatemtn chest, erythema , using biafine cream bid, having difficulty swallowing anything stated patient even water, carafate 4x day,  not helping, using pepcid bid,helping indigestion,  hoarseness, and dry cough, requesting something to numb throat , fatiged, pain 6-7/10 scale  PHYSICAL EXAMINATION: weight is 223 lb 11.2 oz (101.47 kg). Her oral temperature is 97.6 F (36.4 C). Her blood pressure is 151/57 and her pulse is 68. Her respiration is 20.        ASSESSMENT: The patient is doing satisfactorily with treatment.  PLAN: We will continue  with the patient's radiation treatment as planned. The patient has been called in a prescription for Magic mouthwash with lidocaine for continued/worsening esophagitis. She will begin using her pain medicine once again if needed.

## 2014-01-20 NOTE — Progress Notes (Signed)
Weekly rad txs , 23/33 completed, dermatiditis on chest/subclavain ation treatemtn chest, erythema , using biafine cream bid, having difficulty swallowing anything stated patient even water, carafate 4x day,  not helping, using pepcid bid,helping indigestion,  hoarseness, and dry cough, requesting something to numb throat , fatiged, pain 6-7/10 scale

## 2014-01-21 ENCOUNTER — Other Ambulatory Visit (HOSPITAL_BASED_OUTPATIENT_CLINIC_OR_DEPARTMENT_OTHER): Payer: No Typology Code available for payment source

## 2014-01-21 ENCOUNTER — Ambulatory Visit (HOSPITAL_BASED_OUTPATIENT_CLINIC_OR_DEPARTMENT_OTHER): Payer: No Typology Code available for payment source

## 2014-01-21 ENCOUNTER — Ambulatory Visit
Admission: RE | Admit: 2014-01-21 | Discharge: 2014-01-21 | Disposition: A | Discharge status: Home / Self Care | Payer: No Typology Code available for payment source | Source: Ambulatory Visit | Attending: Radiation Oncology | Admitting: Radiation Oncology

## 2014-01-21 DIAGNOSIS — Z5111 Encounter for antineoplastic chemotherapy: Secondary | ICD-10-CM

## 2014-01-21 DIAGNOSIS — C3411 Malignant neoplasm of upper lobe, right bronchus or lung: Secondary | ICD-10-CM

## 2014-01-21 DIAGNOSIS — Z510 Encounter for antineoplastic radiation therapy: Secondary | ICD-10-CM | POA: Diagnosis not present

## 2014-01-21 LAB — COMPREHENSIVE METABOLIC PANEL (CC13)
ALBUMIN: 3.3 g/dL — AB (ref 3.5–5.0)
ALK PHOS: 66 U/L (ref 40–150)
ALT: 23 U/L (ref 0–55)
AST: 16 U/L (ref 5–34)
Anion Gap: 9 mEq/L (ref 3–11)
BUN: 12.4 mg/dL (ref 7.0–26.0)
CO2: 25 mEq/L (ref 22–29)
Calcium: 9.1 mg/dL (ref 8.4–10.4)
Chloride: 108 mEq/L (ref 98–109)
Creatinine: 0.7 mg/dL (ref 0.6–1.1)
EGFR: >90 mL/min/{1.73_m2} (ref >90)
GLUCOSE: 121 mg/dL (ref 70–140)
POTASSIUM: 3.9 meq/L (ref 3.5–5.1)
SODIUM: 141 meq/L (ref 136–145)
TOTAL PROTEIN: 6.8 g/dL (ref 6.4–8.3)
Total Bilirubin: 0.52 mg/dL (ref 0.20–1.20)

## 2014-01-21 LAB — CBC WITH DIFFERENTIAL/PLATELET
BASO%: 0.9 % (ref 0.0–2.0)
Basophils Absolute: 0 10*3/uL (ref 0.0–0.1)
EOS ABS: 0 10*3/uL (ref 0.0–0.5)
EOS%: 1.1 % (ref 0.0–7.0)
HCT: 38.2 % (ref 34.8–46.6)
HGB: 12.4 g/dL (ref 11.6–15.9)
LYMPH%: 13.5 % — ABNORMAL LOW (ref 14.0–49.7)
MCH: 29.9 pg (ref 25.1–34.0)
MCHC: 32.3 g/dL (ref 31.5–36.0)
MCV: 92.5 fL (ref 79.5–101.0)
MONO#: 0.4 10*3/uL (ref 0.1–0.9)
MONO%: 9.5 % (ref 0.0–14.0)
NEUT%: 75 % (ref 38.4–76.8)
NEUTROS ABS: 3.2 10*3/uL (ref 1.5–6.5)
Platelets: 191 10*3/uL (ref 145–400)
RBC: 4.13 10*6/uL (ref 3.70–5.45)
RDW: 13.1 % (ref 11.2–14.5)
WBC: 4.2 10*3/uL (ref 3.9–10.3)
lymph#: 0.6 10*3/uL — ABNORMAL LOW (ref 0.9–3.3)

## 2014-01-21 MED ORDER — ONDANSETRON 16 MG/50ML IVPB (CHCC)
INTRAVENOUS | Status: AC
Start: 2014-01-21 — End: 2014-01-21
  Filled 2014-01-21: qty 16

## 2014-01-21 MED ORDER — LORAZEPAM 2 MG/ML IJ SOLN
1.0000 mg | Freq: Once | INTRAMUSCULAR | Status: AC
  Administered 2014-01-21: 1 mg via INTRAVENOUS

## 2014-01-21 MED ORDER — DIPHENHYDRAMINE HCL 50 MG/ML IJ SOLN
25.0000 mg | Freq: Once | INTRAMUSCULAR | Status: AC
  Administered 2014-01-21: 25 mg via INTRAVENOUS

## 2014-01-21 MED ORDER — DEXAMETHASONE SODIUM PHOSPHATE 20 MG/5ML IJ SOLN
20.0000 mg | Freq: Once | INTRAMUSCULAR | Status: AC
  Administered 2014-01-21: 20 mg via INTRAVENOUS

## 2014-01-21 MED ORDER — DEXAMETHASONE SODIUM PHOSPHATE 20 MG/5ML IJ SOLN
INTRAMUSCULAR | Status: AC
  Filled 2014-01-21: qty 5

## 2014-01-21 MED ORDER — FAMOTIDINE IN NACL 20-0.9 MG/50ML-% IV SOLN
20.0000 mg | Freq: Once | INTRAVENOUS | Status: AC
  Administered 2014-01-21: 20 mg via INTRAVENOUS

## 2014-01-21 MED ORDER — LORAZEPAM 2 MG/ML IJ SOLN
INTRAMUSCULAR | Status: AC
  Filled 2014-01-21: qty 1

## 2014-01-21 MED ORDER — ONDANSETRON 16 MG/50ML IVPB (CHCC)
16.0000 mg | Freq: Once | INTRAVENOUS | Status: AC
  Administered 2014-01-21: 16 mg via INTRAVENOUS

## 2014-01-21 MED ORDER — PACLITAXEL CHEMO INJECTION 300 MG/50ML
45.0000 mg/m2 | Freq: Once | INTRAVENOUS | Status: AC
  Administered 2014-01-21: 102 mg via INTRAVENOUS
  Filled 2014-01-21: qty 17

## 2014-01-21 MED ORDER — SODIUM CHLORIDE 0.9 % IV SOLN
Freq: Once | INTRAVENOUS | Status: AC
  Administered 2014-01-21: 12:00:00 via INTRAVENOUS

## 2014-01-21 MED ORDER — DIPHENHYDRAMINE HCL 50 MG/ML IJ SOLN
INTRAMUSCULAR | Status: AC
  Filled 2014-01-21: qty 1

## 2014-01-21 MED ORDER — CARBOPLATIN CHEMO INJECTION 450 MG/45ML
290.0000 mg | Freq: Once | INTRAVENOUS | Status: AC
  Administered 2014-01-21: 290 mg via INTRAVENOUS
  Filled 2014-01-21: qty 29

## 2014-01-21 MED ORDER — FAMOTIDINE IN NACL 20-0.9 MG/50ML-% IV SOLN
INTRAVENOUS | Status: AC
  Filled 2014-01-21: qty 50

## 2014-01-21 NOTE — Patient Instructions (Signed)
[facility] Discharge Instructions for Patients Receiving Chemotherapy  Today you received the following chemotherapy agents Taxol/Carbo  To help prevent nausea and vomiting after your treatment, we encourage you to take your nausea medication as prescribed/directed.   If you develop nausea and vomiting that is not controlled by your nausea medication, call the clinic.   BELOW ARE SYMPTOMS THAT SHOULD BE REPORTED IMMEDIATELY:  *FEVER GREATER THAN 100.5 F  *CHILLS WITH OR WITHOUT FEVER  NAUSEA AND VOMITING THAT IS NOT CONTROLLED WITH YOUR NAUSEA MEDICATION  *UNUSUAL SHORTNESS OF BREATH  *UNUSUAL BRUISING OR BLEEDING  TENDERNESS IN MOUTH AND THROAT WITH OR WITHOUT PRESENCE OF ULCERS  *URINARY PROBLEMS  *BOWEL PROBLEMS  UNUSUAL RASH Items with * indicate a potential emergency and should be followed up as soon as possible.  Feel free to call the clinic you have any questions or concerns. The clinic phone number is [phone].

## 2014-01-25 ENCOUNTER — Encounter: Payer: Self-pay | Admitting: Radiation Oncology

## 2014-01-25 ENCOUNTER — Ambulatory Visit
Admission: RE | Admit: 2014-01-25 | Discharge: 2014-01-25 | Disposition: A | Discharge status: Home / Self Care | Payer: No Typology Code available for payment source | Source: Ambulatory Visit | Attending: Radiation Oncology | Admitting: Radiation Oncology

## 2014-01-25 VITALS — BP 150/78 | HR 86 | Temp 98.9°F | Resp 20 | Wt 228.1 lb

## 2014-01-25 DIAGNOSIS — C3411 Malignant neoplasm of upper lobe, right bronchus or lung: Secondary | ICD-10-CM

## 2014-01-25 DIAGNOSIS — Z510 Encounter for antineoplastic radiation therapy: Secondary | ICD-10-CM | POA: Diagnosis not present

## 2014-01-25 MED ORDER — BIAFINE EX EMUL
CUTANEOUS | Status: DC | PRN
  Administered 2014-01-25: 15:00:00 via TOPICAL

## 2014-01-25 NOTE — Progress Notes (Signed)
Weekly rad txs here before tx, last tx on 01/21/14 24/33 completed rt lung, dry desquamation on chest/subcalvian area, using biafine bid, crying, difficulty swallowing anything, did eat scrambled eggs with soaked toast this am, but everytime she swallows she feels it hurts 7/10 scale and gets stuck in her throatm, she is taking carag=fate 4x day, lidocaine and not helping stated, taking pain medication also, nausea with oxycodone but forgot to take her zofran which helps that., very fatigued this wek more so than last week, dry heaves this am, can tolerate chicken noodle soup with crackers "I don't know how much more I can take"took ortho vitals sitting b/p=164/78,P=77,RR=20,T=98.9, room air sats=100% Standing b/p=150/78,P=86 2:53 PM  2:53 PM

## 2014-01-25 NOTE — Progress Notes (Signed)
CC: Dr. Tyler Pita   Weekly Management Note:  Site: Right upper lung/lower neck Current Dose:  5000  cGy Projected Dose: 6600  cGy  Narrative: The patient is seen today for routine under treatment assessment. CBCT/MVCT images/port films were reviewed. The chart was reviewed.   She continues to be bothered by pain on swallowing and also discomfort along her right lower neck.  Carafate and viscous Xylocaine all of minimal benefit.  She has difficulty with any narcotics, causing nausea and vomiting.  She takes OxyContin when necessary but forgot to take ondansetron and thus she is nauseated.  She now understands and OxyContin is not a when necessary medication.  Her weight is up 5 pounds over the past week.  Physical Examination:  Filed Vitals:   01/25/14 1454  BP: 150/78  Pulse: 86  Temp:   Resp: 20  .  Weight: 228 lb 1.6 oz (103.465 kg).  There is marked erythema along her lower right neck/clavicular region.  Impression: She is having a difficult time with radiation therapy, primarily from odynophagia with nausea secondary to narcotics.  I instructed her to take ondansetron around the clock when she is taking OxyContin.  She understands and OxyContin is not a prn medication.  She might be a candidate for a fentanyl patch for pain control which I believe causes less nausea.  She will see Dr. Tammi Klippel this Thursday.  Plan: Continue radiation therapy as planned.

## 2014-01-26 ENCOUNTER — Ambulatory Visit
Admission: RE | Admit: 2014-01-26 | Discharge: 2014-01-26 | Disposition: A | Discharge status: Home / Self Care | Payer: No Typology Code available for payment source | Source: Ambulatory Visit | Attending: Radiation Oncology | Admitting: Radiation Oncology

## 2014-01-26 DIAGNOSIS — Z510 Encounter for antineoplastic radiation therapy: Secondary | ICD-10-CM | POA: Diagnosis not present

## 2014-01-27 ENCOUNTER — Encounter: Payer: Self-pay | Admitting: Radiation Oncology

## 2014-01-27 ENCOUNTER — Ambulatory Visit
Admission: RE | Admit: 2014-01-27 | Discharge: 2014-01-27 | Disposition: A | Discharge status: Home / Self Care | Payer: No Typology Code available for payment source | Source: Ambulatory Visit | Attending: Radiation Oncology | Admitting: Radiation Oncology

## 2014-01-27 VITALS — BP 143/83 | HR 64 | Temp 97.6°F | Resp 20 | Wt 227.5 lb

## 2014-01-27 DIAGNOSIS — C3411 Malignant neoplasm of upper lobe, right bronchus or lung: Secondary | ICD-10-CM

## 2014-01-27 DIAGNOSIS — Z510 Encounter for antineoplastic radiation therapy: Secondary | ICD-10-CM | POA: Diagnosis not present

## 2014-01-27 NOTE — Progress Notes (Signed)
  Radiation Oncology         [phone] ________________________________  Name: Jasmine Grant MRN: [id number]  Date: 01/27/2014  DOB: October 01, 1955  Weekly Radiation Therapy Management    ICD-9-CM ICD-10-CM   1. 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 . . . . . . . The patient presents for routine under treatment assessment.                                   Weekly rad txs 27/33 completd right lung, hoarseness, difficulty swallowing some foods, sore throat, dry desquamation on neck and and rt chest, using biafine tid, refuses to take percocet, "makes me a zombie, Im going to get through this,not going to give up,I feel better today than the other day " saw Dr.Murray, ate pancakes ,chewed till mush and got down, beter that way, drank koolaid, jello, water,soup tonight, ,fatigued, ano nausea  No pain,                                 Set-up films were reviewed.                                 The chart was checked. Physical Findings. . .  weight is 227 lb 8 oz (103.193 kg). Her oral temperature is 97.6 F (36.4 C). Her blood pressure is 143/83 and her pulse is 64. Her respiration is 20. Marland Kitchen Spot of moist desq in right supraclav region.  Weight essentially stable.  No significant changes. Impression . . . . . . . The patient is tolerating radiation. Plan . . . . . . . . . . . . Continue treatment as planned.  Recommended neosporin to open site.  ________________________________  Sheral Apley. Tammi Klippel, M.D.

## 2014-01-27 NOTE — Progress Notes (Signed)
Weekly rad txs 27/33 completd right lung, hoarseness, difficulty swallowing some foods, sore throat, dry desquamation on neck and and rt chest, using biafine tid, refuses to take percocet, "makes me a zombie, Im going to get through this,not going to give up,I feel better today than the other day " saw Dr.Murray, ate pancakes ,chewed till mush and got down, beter that way, drank koolaid, jello, water,soup tonight, ,fatigued, ano nausea  No pain, 3:40 PM

## 2014-01-28 ENCOUNTER — Other Ambulatory Visit (HOSPITAL_BASED_OUTPATIENT_CLINIC_OR_DEPARTMENT_OTHER): Payer: No Typology Code available for payment source

## 2014-01-28 ENCOUNTER — Encounter: Payer: Self-pay | Admitting: Hematology and Oncology

## 2014-01-28 ENCOUNTER — Ambulatory Visit (HOSPITAL_BASED_OUTPATIENT_CLINIC_OR_DEPARTMENT_OTHER): Payer: No Typology Code available for payment source

## 2014-01-28 ENCOUNTER — Ambulatory Visit (HOSPITAL_BASED_OUTPATIENT_CLINIC_OR_DEPARTMENT_OTHER): Payer: No Typology Code available for payment source | Admitting: Hematology and Oncology

## 2014-01-28 ENCOUNTER — Ambulatory Visit
Admission: RE | Admit: 2014-01-28 | Discharge: 2014-01-28 | Disposition: A | Discharge status: Home / Self Care | Payer: No Typology Code available for payment source | Source: Ambulatory Visit | Attending: Radiation Oncology | Admitting: Radiation Oncology

## 2014-01-28 ENCOUNTER — Other Ambulatory Visit: Payer: No Typology Code available for payment source

## 2014-01-28 ENCOUNTER — Telehealth: Payer: Self-pay | Admitting: Hematology and Oncology

## 2014-01-28 VITALS — BP 142/64 | HR 66 | Temp 98.0°F | Resp 19 | Ht 74.0 in | Wt 226.6 lb

## 2014-01-28 DIAGNOSIS — D701 Agranulocytosis secondary to cancer chemotherapy: Secondary | ICD-10-CM | POA: Insufficient documentation

## 2014-01-28 DIAGNOSIS — L598 Other specified disorders of the skin and subcutaneous tissue related to radiation: Secondary | ICD-10-CM

## 2014-01-28 DIAGNOSIS — C3411 Malignant neoplasm of upper lobe, right bronchus or lung: Secondary | ICD-10-CM

## 2014-01-28 DIAGNOSIS — L589 Radiodermatitis, unspecified: Secondary | ICD-10-CM

## 2014-01-28 DIAGNOSIS — Z5111 Encounter for antineoplastic chemotherapy: Secondary | ICD-10-CM

## 2014-01-28 DIAGNOSIS — K209 Esophagitis, unspecified without bleeding: Secondary | ICD-10-CM

## 2014-01-28 DIAGNOSIS — T451X5A Adverse effect of antineoplastic and immunosuppressive drugs, initial encounter: Secondary | ICD-10-CM

## 2014-01-28 DIAGNOSIS — Z510 Encounter for antineoplastic radiation therapy: Secondary | ICD-10-CM | POA: Diagnosis not present

## 2014-01-28 LAB — COMPREHENSIVE METABOLIC PANEL (CC13)
ALK PHOS: 73 U/L (ref 40–150)
ALT: 20 U/L (ref 0–55)
AST: 16 U/L (ref 5–34)
Albumin: 3.3 g/dL — ABNORMAL LOW (ref 3.5–5.0)
Anion Gap: 11 mEq/L (ref 3–11)
BUN: 7 mg/dL (ref 7.0–26.0)
CALCIUM: 9.3 mg/dL (ref 8.4–10.4)
CHLORIDE: 105 meq/L (ref 98–109)
CO2: 26 mEq/L (ref 22–29)
CREATININE: 0.7 mg/dL (ref 0.6–1.1)
EGFR: >90 mL/min/{1.73_m2} (ref >90)
Glucose: 97 mg/dl (ref 70–140)
Potassium: 3.9 mEq/L (ref 3.5–5.1)
Sodium: 142 mEq/L (ref 136–145)
Total Bilirubin: 0.5 mg/dL (ref 0.20–1.20)
Total Protein: 6.9 g/dL (ref 6.4–8.3)

## 2014-01-28 LAB — CBC WITH DIFFERENTIAL/PLATELET
BASO%: 0.7 % (ref 0.0–2.0)
BASOS ABS: 0 10*3/uL (ref 0.0–0.1)
EOS%: 0.9 % (ref 0.0–7.0)
Eosinophils Absolute: 0 10*3/uL (ref 0.0–0.5)
HEMATOCRIT: 38.5 % (ref 34.8–46.6)
HGB: 12.4 g/dL (ref 11.6–15.9)
LYMPH#: 0.5 10*3/uL — AB (ref 0.9–3.3)
LYMPH%: 15.8 % (ref 14.0–49.7)
MCH: 30 pg (ref 25.1–34.0)
MCHC: 32.3 g/dL (ref 31.5–36.0)
MCV: 92.9 fL (ref 79.5–101.0)
MONO#: 0.5 10*3/uL (ref 0.1–0.9)
MONO%: 15.3 % — ABNORMAL HIGH (ref 0.0–14.0)
NEUT#: 2.3 10*3/uL (ref 1.5–6.5)
NEUT%: 67.3 % (ref 38.4–76.8)
Platelets: 160 10*3/uL (ref 145–400)
RBC: 4.14 10*6/uL (ref 3.70–5.45)
RDW: 13.1 % (ref 11.2–14.5)
WBC: 3.5 10*3/uL — AB (ref 3.9–10.3)

## 2014-01-28 MED ORDER — FAMOTIDINE IN NACL 20-0.9 MG/50ML-% IV SOLN
20.0000 mg | Freq: Once | INTRAVENOUS | Status: AC
  Administered 2014-01-28: 20 mg via INTRAVENOUS

## 2014-01-28 MED ORDER — SODIUM CHLORIDE 0.9 % IV SOLN
Freq: Once | INTRAVENOUS | Status: AC
  Administered 2014-01-28: 14:00:00 via INTRAVENOUS

## 2014-01-28 MED ORDER — PACLITAXEL CHEMO INJECTION 300 MG/50ML
45.0000 mg/m2 | Freq: Once | INTRAVENOUS | Status: AC
  Administered 2014-01-28: 102 mg via INTRAVENOUS
  Filled 2014-01-28: qty 17

## 2014-01-28 MED ORDER — LORAZEPAM 2 MG/ML IJ SOLN
1.0000 mg | Freq: Once | INTRAMUSCULAR | Status: AC
  Administered 2014-01-28: 1 mg via INTRAVENOUS

## 2014-01-28 MED ORDER — DIPHENHYDRAMINE HCL 50 MG/ML IJ SOLN
25.0000 mg | Freq: Once | INTRAMUSCULAR | Status: AC
  Administered 2014-01-28: 25 mg via INTRAVENOUS

## 2014-01-28 MED ORDER — ONDANSETRON 16 MG/50ML IVPB (CHCC)
INTRAVENOUS | Status: AC
  Filled 2014-01-28: qty 16

## 2014-01-28 MED ORDER — DIPHENHYDRAMINE HCL 50 MG/ML IJ SOLN
INTRAMUSCULAR | Status: AC
  Filled 2014-01-28: qty 1

## 2014-01-28 MED ORDER — DEXAMETHASONE SODIUM PHOSPHATE 20 MG/5ML IJ SOLN
INTRAMUSCULAR | Status: AC
  Filled 2014-01-28: qty 5

## 2014-01-28 MED ORDER — DEXAMETHASONE SODIUM PHOSPHATE 20 MG/5ML IJ SOLN
20.0000 mg | Freq: Once | INTRAMUSCULAR | Status: AC
  Administered 2014-01-28: 20 mg via INTRAVENOUS

## 2014-01-28 MED ORDER — LORAZEPAM 2 MG/ML IJ SOLN
INTRAMUSCULAR | Status: AC
  Filled 2014-01-28: qty 1

## 2014-01-28 MED ORDER — SODIUM CHLORIDE 0.9 % IV SOLN
290.0000 mg | Freq: Once | INTRAVENOUS | Status: AC
  Administered 2014-01-28: 290 mg via INTRAVENOUS
  Filled 2014-01-28: qty 29

## 2014-01-28 MED ORDER — FAMOTIDINE IN NACL 20-0.9 MG/50ML-% IV SOLN
INTRAVENOUS | Status: AC
  Filled 2014-01-28: qty 50

## 2014-01-28 MED ORDER — ONDANSETRON 16 MG/50ML IVPB (CHCC)
16.0000 mg | Freq: Once | INTRAVENOUS | Status: AC
  Administered 2014-01-28: 16 mg via INTRAVENOUS

## 2014-01-28 NOTE — Telephone Encounter (Signed)
Gave avs & cal for Jan.

## 2014-01-28 NOTE — Assessment & Plan Note (Signed)
This is likely due to recent treatment. The patient denies recent history of fevers, cough, chills, diarrhea or dysuria. She is asymptomatic from the leukopenia. I will observe for now.  I will continue the chemotherapy at current dose without dosage adjustment.  If the leukopenia gets progressive worse in the future, I might have to delay her treatment or adjust the chemotherapy dose.   

## 2014-01-28 NOTE — Patient Instructions (Signed)
[facility] Discharge Instructions for Patients Receiving Chemotherapy  Today you received the following chemotherapy agents Paclitaxel/Carboplatin.   To help prevent nausea and vomiting after your treatment, we encourage you to take your nausea medication as directed.    If you develop nausea and vomiting that is not controlled by your nausea medication, call the clinic.   BELOW ARE SYMPTOMS THAT SHOULD BE REPORTED IMMEDIATELY:  *FEVER GREATER THAN 100.5 F  *CHILLS WITH OR WITHOUT FEVER  NAUSEA AND VOMITING THAT IS NOT CONTROLLED WITH YOUR NAUSEA MEDICATION  *UNUSUAL SHORTNESS OF BREATH  *UNUSUAL BRUISING OR BLEEDING  TENDERNESS IN MOUTH AND THROAT WITH OR WITHOUT PRESENCE OF ULCERS  *URINARY PROBLEMS  *BOWEL PROBLEMS  UNUSUAL RASH Items with * indicate a potential emergency and should be followed up as soon as possible.  Feel free to call the clinic you have any questions or concerns. The clinic phone number is [phone].    

## 2014-01-28 NOTE — Progress Notes (Signed)
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   12/13/2013 Imaging CT scan show large mass in the right lung apex likely representing primary lung carcinoma or large metastasis. Metastases demonstrated in the pretracheal lymph nodes, T1 vertebral body and possibly sacrum.   12/14/2013 Initial Diagnosis Adenocarcinoma of upper lobe of right lung   12/14/2013 Pathology Results Accession: [id number] biopsy from right upper lobe showed adenocarcinoma   12/17/2013 Imaging MRI of the head is negative.   12/23/2013 -  Radiation Therapy She received radiation therapy to the mediastinal mass and bone   12/28/2013 Imaging PET/CT scan showed mediastinal mass, lymphadenopathy and T1 involvement.   01/01/2014 -  Chemotherapy Weekly carboplatin and Taxol were added    INTERVAL HISTORY: Please see below for problem oriented charting. She feels well. She is starting to experience radiation-induced skin injury. No ulceration. She still had persistent esophagitis but it is not severe. Denies peripheral neuropathy.  REVIEW OF SYSTEMS:   Constitutional: Denies fevers, chills or abnormal weight loss Eyes: Denies blurriness of vision Ears, nose, mouth, throat, and face: Denies mucositis or sore throat Respiratory: Denies cough, dyspnea or wheezes Cardiovascular: Denies palpitation, chest discomfort or lower extremity swelling Gastrointestinal:  Denies nausea, heartburn or change in bowel habits Lymphatics: Denies new lymphadenopathy or easy bruising Neurological:Denies numbness, tingling or new weaknesses Behavioral/Psych: Mood is stable,  no new changes  All other systems were reviewed with the patient and are negative.  I have reviewed the past medical history, past surgical history, social history and family history with the patient and they are unchanged from previous note.  ALLERGIES:  is allergic to penicillins.  MEDICATIONS:  Current Outpatient Prescriptions  Medication Sig Dispense Refill  . ALPRAZolam (XANAX) 0.25 MG tablet Take 0.125 mg by mouth daily as needed. For anxiety    . Alum & Mag Hydroxide-Simeth (MAGIC MOUTHWASH W/LIDOCAINE) SOLN Take 5 mLs by mouth 4 (four) times daily as needed for mouth pain. 500 mL 1  . cyclobenzaprine (FLEXERIL) 10 MG tablet Take 10 mg by mouth daily as needed for muscle spasms.    Marland Kitchen emollient (BIAFINE) cream Apply topically as needed. (Patient taking differently: Apply 1 application topically as needed (2nd tube given 01/25/14). ) 454 g 0  . ibuprofen (ADVIL,MOTRIN) 200 MG tablet Take 400 mg by mouth once.    Marland Kitchen levothyroxine (SYNTHROID, LEVOTHROID) 125 MCG tablet Take 125 mcg by mouth daily.    Marland Kitchen levothyroxine (SYNTHROID, LEVOTHROID) 137 MCG tablet Take 137 mcg by mouth daily.    Marland Kitchen lidocaine (LIDODERM) 5 % Place 1 patch onto the skin daily. Remove & Discard patch within 12 hours or as directed by MD 30 patch 0  . meloxicam (MOBIC) 15 MG tablet Take 15 mg by mouth daily.    . methocarbamol (ROBAXIN) 500 MG tablet Take 500 mg by mouth 2 (two) times daily.    . mirtazapine (REMERON) 15 MG tablet Take 1 tablet (15 mg total) by mouth at bedtime. 30 tablet 3  . ondansetron (ZOFRAN) 4 MG tablet Take 1 tablet (4 mg total) by mouth every 6 (six) hours as needed for nausea. 40 tablet 0  .  OxyCODONE (OXYCONTIN) 10 mg T12A 12 hr tablet Take 1 tablet (10 mg total) by mouth every 12 (twelve) hours. 60 tablet 0  . sucralfate (CARAFATE) 1 G tablet Take 1 tablet (1 g total) by mouth 4 (four) times daily. 120 tablet 2  . traMADol (ULTRAM) 50 MG tablet Take 2 tablets (100 mg total) by mouth every 6  (six) hours as needed for moderate pain. 90 tablet 1   No current facility-administered medications for this visit.    PHYSICAL EXAMINATION: ECOG PERFORMANCE STATUS: 1 - Symptomatic but completely ambulatory  Filed Vitals:   01/28/14 1320  BP: 142/64  Pulse: 66  Temp: 98 F (36.7 C)  Resp: 19   Filed Weights   01/28/14 1320  Weight: 226 lb 9.6 oz (102.785 kg)    GENERAL:alert, no distress and comfortable SKIN: No radiation-induced skin injury. No ulceration. EYES: normal, Conjunctiva are pink and non-injected, sclera clear Musculoskeletal:no cyanosis of digits and no clubbing  NEURO: alert & oriented x 3 with fluent speech, no focal motor/sensory deficits  LABORATORY DATA:  I have reviewed the data as listed    Component Value Date/Time   NA 141 01/21/2014 1038   NA 142 12/14/2013 0521   K 3.9 01/21/2014 1038   K 4.3 12/14/2013 0521   CL 104 12/14/2013 0521   CO2 25 01/21/2014 1038   CO2 26 12/14/2013 0521   GLUCOSE 121 01/21/2014 1038   GLUCOSE 155* 12/14/2013 0521   BUN 12.4 01/21/2014 1038   BUN 11 12/14/2013 0521   CREATININE 0.7 01/21/2014 1038   CREATININE 0.55 12/14/2013 0521   CALCIUM 9.1 01/21/2014 1038   CALCIUM 9.1 12/14/2013 0521   PROT 6.8 01/21/2014 1038   PROT 7.5 12/13/2013 2018   ALBUMIN 3.3* 01/21/2014 1038   ALBUMIN 3.7 12/13/2013 2018   AST 16 01/21/2014 1038   AST 17 12/13/2013 2018   ALT 23 01/21/2014 1038   ALT 14 12/13/2013 2018   ALKPHOS 66 01/21/2014 1038   ALKPHOS 80 12/13/2013 2018   BILITOT 0.52 01/21/2014 1038   BILITOT 0.3 12/13/2013 2018   GFRNONAA >90 12/14/2013 0521   GFRAA >90 12/14/2013 0521    No results found for: SPEP, UPEP  Lab Results  Component Value Date   WBC 3.5* 01/28/2014   NEUTROABS 2.3 01/28/2014   HGB 12.4 01/28/2014   HCT 38.5 01/28/2014   MCV 92.9 01/28/2014   PLT 160 01/28/2014      Chemistry      Component Value Date/Time   NA 141 01/21/2014 1038   NA 142 12/14/2013 0521   K 3.9  01/21/2014 1038   K 4.3 12/14/2013 0521   CL 104 12/14/2013 0521   CO2 25 01/21/2014 1038   CO2 26 12/14/2013 0521   BUN 12.4 01/21/2014 1038   BUN 11 12/14/2013 0521   CREATININE 0.7 01/21/2014 1038   CREATININE 0.55 12/14/2013 0521      Component Value Date/Time   CALCIUM 9.1 01/21/2014 1038   CALCIUM 9.1 12/14/2013 0521   ALKPHOS 66 01/21/2014 1038   ALKPHOS 80 12/13/2013 2018   AST 16 01/21/2014 1038   AST 17 12/13/2013 2018   ALT 23 01/21/2014 1038   ALT 14 12/13/2013 2018   BILITOT 0.52 01/21/2014 1038   BILITOT 0.3 12/13/2013 2018      ASSESSMENT & PLAN:  Adenocarcinoma of upper lobe of right lung She had minor sensitivity reaction to 1st dose Taxol but managed to complete her treatment on time. She tolerated  all her treatment well without side effects. We will continue treatment without dose adjustment or changes  Leukopenia due to antineoplastic chemotherapy This is likely due to recent treatment. The patient denies recent history of fevers, cough, chills, diarrhea or dysuria. She is asymptomatic from the leukopenia. I will observe for now.  I will continue the chemotherapy at current dose without dosage adjustment.  If the leukopenia gets progressive worse in the future, I might have to delay her treatment or adjust the chemotherapy dose.   Esophagitis Improved with Pepcid. Carafate is added. Recommend she continue to same.  Radiation-induced dermatitis She will continue topical emollient cream. There is no signs of ulceration of the skin right now.   No orders of the defined types were placed in this encounter.   All questions were answered. The patient knows to call the clinic with any problems, questions or concerns. No barriers to learning was detected. I spent 25 minutes counseling the patient face to face. The total time spent in the appointment was 30 minutes and more than 50% was on counseling and review of test results     [facility], [place], MD 01/28/2014  1:37 PM

## 2014-01-28 NOTE — Assessment & Plan Note (Signed)
She will continue topical emollient cream. There is no signs of ulceration of the skin right now.

## 2014-01-28 NOTE — Assessment & Plan Note (Signed)
She had minor sensitivity reaction to 1st dose Taxol but managed to complete her treatment on time. She tolerated all her treatment well without side effects. We will continue treatment without dose adjustment or changes

## 2014-01-28 NOTE — Assessment & Plan Note (Signed)
Improved with Pepcid. Carafate is added. Recommend she continue to same.

## 2014-02-01 ENCOUNTER — Ambulatory Visit
Admission: RE | Admit: 2014-02-01 | Discharge: 2014-02-01 | Disposition: A | Discharge status: Home / Self Care | Payer: No Typology Code available for payment source | Source: Ambulatory Visit | Attending: Radiation Oncology | Admitting: Radiation Oncology

## 2014-02-01 DIAGNOSIS — Z510 Encounter for antineoplastic radiation therapy: Secondary | ICD-10-CM | POA: Diagnosis not present

## 2014-02-02 ENCOUNTER — Ambulatory Visit
Admission: RE | Admit: 2014-02-02 | Discharge: 2014-02-02 | Disposition: A | Discharge status: Home / Self Care | Payer: No Typology Code available for payment source | Source: Ambulatory Visit | Attending: Radiation Oncology | Admitting: Radiation Oncology

## 2014-02-02 ENCOUNTER — Ambulatory Visit: Payer: No Typology Code available for payment source

## 2014-02-02 DIAGNOSIS — Z510 Encounter for antineoplastic radiation therapy: Secondary | ICD-10-CM | POA: Diagnosis not present

## 2014-02-03 ENCOUNTER — Ambulatory Visit: Payer: No Typology Code available for payment source

## 2014-02-03 ENCOUNTER — Ambulatory Visit
Admission: RE | Admit: 2014-02-03 | Discharge: 2014-02-03 | Disposition: A | Discharge status: Home / Self Care | Payer: No Typology Code available for payment source | Source: Ambulatory Visit | Attending: Radiation Oncology | Admitting: Radiation Oncology

## 2014-02-03 DIAGNOSIS — Z510 Encounter for antineoplastic radiation therapy: Secondary | ICD-10-CM | POA: Diagnosis not present

## 2014-02-04 ENCOUNTER — Ambulatory Visit: Payer: No Typology Code available for payment source

## 2014-02-04 ENCOUNTER — Ambulatory Visit
Admission: RE | Admit: 2014-02-04 | Discharge: 2014-02-04 | Disposition: A | Discharge status: Home / Self Care | Payer: No Typology Code available for payment source | Source: Ambulatory Visit | Attending: Radiation Oncology | Admitting: Radiation Oncology

## 2014-02-04 DIAGNOSIS — Z510 Encounter for antineoplastic radiation therapy: Secondary | ICD-10-CM | POA: Diagnosis not present

## 2014-02-05 ENCOUNTER — Ambulatory Visit
Admission: RE | Admit: 2014-02-05 | Discharge: 2014-02-05 | Disposition: A | Discharge status: Home / Self Care | Payer: No Typology Code available for payment source | Source: Ambulatory Visit | Attending: Radiation Oncology | Admitting: Radiation Oncology

## 2014-02-05 ENCOUNTER — Ambulatory Visit
Admission: RE | Admit: 2014-02-05 | Payer: No Typology Code available for payment source | Source: Ambulatory Visit | Admitting: Radiation Oncology

## 2014-02-05 ENCOUNTER — Ambulatory Visit: Payer: No Typology Code available for payment source | Admitting: Hematology and Oncology

## 2014-02-05 ENCOUNTER — Telehealth: Payer: Self-pay | Admitting: *Deleted

## 2014-02-05 ENCOUNTER — Encounter: Payer: Self-pay | Admitting: Radiation Oncology

## 2014-02-05 ENCOUNTER — Encounter: Payer: Self-pay | Admitting: Hematology and Oncology

## 2014-02-05 ENCOUNTER — Telehealth: Payer: Self-pay | Admitting: Hematology and Oncology

## 2014-02-05 ENCOUNTER — Other Ambulatory Visit (HOSPITAL_BASED_OUTPATIENT_CLINIC_OR_DEPARTMENT_OTHER): Payer: No Typology Code available for payment source

## 2014-02-05 ENCOUNTER — Ambulatory Visit (HOSPITAL_BASED_OUTPATIENT_CLINIC_OR_DEPARTMENT_OTHER): Payer: No Typology Code available for payment source | Admitting: Hematology and Oncology

## 2014-02-05 ENCOUNTER — Other Ambulatory Visit: Payer: Self-pay | Admitting: Hematology and Oncology

## 2014-02-05 ENCOUNTER — Ambulatory Visit: Payer: No Typology Code available for payment source

## 2014-02-05 VITALS — BP 127/62 | HR 78 | Temp 98.1°F | Resp 20 | Ht 74.0 in | Wt 225.3 lb

## 2014-02-05 VITALS — BP 156/75 | HR 82 | Temp 97.6°F | Resp 20 | Wt 225.9 lb

## 2014-02-05 DIAGNOSIS — K209 Esophagitis, unspecified without bleeding: Secondary | ICD-10-CM

## 2014-02-05 DIAGNOSIS — G4700 Insomnia, unspecified: Secondary | ICD-10-CM

## 2014-02-05 DIAGNOSIS — D701 Agranulocytosis secondary to cancer chemotherapy: Secondary | ICD-10-CM

## 2014-02-05 DIAGNOSIS — C3411 Malignant neoplasm of upper lobe, right bronchus or lung: Secondary | ICD-10-CM

## 2014-02-05 DIAGNOSIS — Z510 Encounter for antineoplastic radiation therapy: Secondary | ICD-10-CM | POA: Diagnosis not present

## 2014-02-05 DIAGNOSIS — T451X5A Adverse effect of antineoplastic and immunosuppressive drugs, initial encounter: Secondary | ICD-10-CM

## 2014-02-05 DIAGNOSIS — D72819 Decreased white blood cell count, unspecified: Secondary | ICD-10-CM

## 2014-02-05 DIAGNOSIS — L589 Radiodermatitis, unspecified: Secondary | ICD-10-CM

## 2014-02-05 LAB — COMPREHENSIVE METABOLIC PANEL (CC13)
ALT: 12 U/L (ref 0–55)
AST: 12 U/L (ref 5–34)
Albumin: 3.3 g/dL — ABNORMAL LOW (ref 3.5–5.0)
Alkaline Phosphatase: 71 U/L (ref 40–150)
Anion Gap: 10 mEq/L (ref 3–11)
BILIRUBIN TOTAL: 0.4 mg/dL (ref 0.20–1.20)
BUN: 11.4 mg/dL (ref 7.0–26.0)
CO2: 26 meq/L (ref 22–29)
Calcium: 9.6 mg/dL (ref 8.4–10.4)
Chloride: 105 mEq/L (ref 98–109)
Creatinine: 0.7 mg/dL (ref 0.6–1.1)
EGFR: >90 mL/min/{1.73_m2} (ref >90)
Glucose: 125 mg/dl (ref 70–140)
Potassium: 3.9 mEq/L (ref 3.5–5.1)
SODIUM: 141 meq/L (ref 136–145)
Total Protein: 6.9 g/dL (ref 6.4–8.3)

## 2014-02-05 LAB — CBC WITH DIFFERENTIAL/PLATELET
BASO%: 0.3 % (ref 0.0–2.0)
BASOS ABS: 0 10*3/uL (ref 0.0–0.1)
EOS%: 0.8 % (ref 0.0–7.0)
Eosinophils Absolute: 0 10*3/uL (ref 0.0–0.5)
HCT: 35.3 % (ref 34.8–46.6)
HEMOGLOBIN: 11.4 g/dL — AB (ref 11.6–15.9)
LYMPH#: 0.4 10*3/uL — AB (ref 0.9–3.3)
LYMPH%: 14.2 % (ref 14.0–49.7)
MCH: 30 pg (ref 25.1–34.0)
MCHC: 32.2 g/dL (ref 31.5–36.0)
MCV: 93.1 fL (ref 79.5–101.0)
MONO#: 0.4 10*3/uL (ref 0.1–0.9)
MONO%: 13.5 % (ref 0.0–14.0)
NEUT%: 71.2 % (ref 38.4–76.8)
NEUTROS ABS: 2.2 10*3/uL (ref 1.5–6.5)
PLATELETS: 137 10*3/uL — AB (ref 145–400)
RBC: 3.79 10*6/uL (ref 3.70–5.45)
RDW: 13.4 % (ref 11.2–14.5)
WBC: 3 10*3/uL — AB (ref 3.9–10.3)

## 2014-02-05 MED ORDER — LIDOCAINE-PRILOCAINE 2.5-2.5 % EX CREA: TOPICAL_CREAM | CUTANEOUS | Status: DC

## 2014-02-05 MED ORDER — MAGIC MOUTHWASH W/LIDOCAINE: 10.0000 mL | Freq: Four times a day (QID) | ORAL | Status: DC | PRN

## 2014-02-05 MED ORDER — TEMAZEPAM 15 MG PO CAPS: 15.0000 mg | ORAL_CAPSULE | Freq: Every evening | ORAL | Status: DC | PRN

## 2014-02-05 MED ORDER — FOLIC ACID 1 MG PO TABS
1.0000 mg | ORAL_TABLET | Freq: Every day | ORAL | Status: DC
Start: 2014-02-05 — End: 2014-07-08

## 2014-02-05 NOTE — Telephone Encounter (Signed)
Called patient home, left message to come see MD 1st before tx, please be here between 1130-1145, rad tx 1200, lab 1230,and infusion after,cll back if cannot be here erarly 8:48 AM

## 2014-02-05 NOTE — Telephone Encounter (Signed)
Pt confirmed labs/ov per 01/08 POF, gave pt AVS... KJ,sent msg to add chemo and also pt req a msg to MD about her port to be added.... KJ

## 2014-02-05 NOTE — Progress Notes (Signed)
  Department of Radiation Oncology  Phone:  [phone] Fax:        [phone]  Weekly Treatment Note    Name: Jasmine Grant Date: 02/05/2014 MRN: [id number] DOB: 05-Feb-1955   Current dose: 66 Gy  Current fraction: 33   MEDICATIONS: Current Outpatient Prescriptions  Medication Sig Dispense Refill  . emollient (BIAFINE) cream Apply topically as needed. (Patient taking differently: Apply 1 application topically as needed (2nd tube given 01/25/14). ) 454 g 0  . levothyroxine (SYNTHROID, LEVOTHROID) 125 MCG tablet Take 125 mcg by mouth daily.    Marland Kitchen levothyroxine (SYNTHROID, LEVOTHROID) 137 MCG tablet Take 137 mcg by mouth daily.    . mirtazapine (REMERON) 15 MG tablet Take 1 tablet (15 mg total) by mouth at bedtime. 30 tablet 3  . ondansetron (ZOFRAN) 4 MG tablet Take 1 tablet (4 mg total) by mouth every 6 (six) hours as needed for nausea. (Patient not taking: Reported on 02/05/2014) 40 tablet 0  . OxyCODONE (OXYCONTIN) 10 mg T12A 12 hr tablet Take 1 tablet (10 mg total) by mouth every 12 (twelve) hours. (Patient not taking: Reported on 02/05/2014) 60 tablet 0  . Alum & Mag Hydroxide-Simeth (MAGIC MOUTHWASH W/LIDOCAINE) SOLN Take 10 mLs by mouth 4 (four) times daily as needed for mouth pain. 709 mL 1  . folic acid (FOLVITE) 1 MG tablet Take 1 tablet (1 mg total) by mouth daily. Start 5-7 days before Alimta chemotherapy. Continue until 21 days after Alimta completed. 100 tablet 3  . lidocaine-prilocaine (EMLA) cream Apply to affected area once 30 g 3  . temazepam (RESTORIL) 15 MG capsule Take 1 capsule (15 mg total) by mouth at bedtime as needed for sleep. 30 capsule 0   No current facility-administered medications for this encounter.     ALLERGIES: Penicillins   LABORATORY DATA:  Lab Results  Component Value Date   WBC 3.0* 02/05/2014   HGB 11.4* 02/05/2014   HCT 35.3 02/05/2014   MCV 93.1 02/05/2014   PLT 137* 02/05/2014   Lab Results  Component Value Date   NA 141  02/05/2014   K 3.9 02/05/2014   CL 104 12/14/2013   CO2 26 02/05/2014   Lab Results  Component Value Date   ALT 12 02/05/2014   AST 12 02/05/2014   ALKPHOS 71 02/05/2014   BILITOT 0.40 02/05/2014     NARRATIVE: Jasmine Grant was seen today for weekly treatment management. The chart was checked and the patient's films were reviewed.   Here before last tx rt lung, excoriated right neckl/chest, uses biafine 3-4x daily, gave another tube, uses neosporin on neck at night, difficulty swallowing, uses MMW, 65ml not helping needs to be 78ml , will ask Dr. Alvy Bimler to increas doasge, she is out,  can't tolerate mashed potatoes, applesauce tolerable, pain inside thorat 8/10 pain scale, energy level fair, no nausea, 2 week f/u appt card given   PHYSICAL EXAMINATION: weight is 225 lb 14.4 oz (102.468 kg). Her oral temperature is 97.6 F (36.4 C). Her blood pressure is 156/75 and her pulse is 82. Her respiration is 20 and oxygen saturation is 99%.      erythema in the supraclavicular region which represents bright erythema  ASSESSMENT: The patient did satisfactorily with treatment. She'll need esophagitis and skin irritation. This should substantially heal within the next several weeks.  PLAN: The patient will follow-up in our clinic in 1 month.

## 2014-02-05 NOTE — Assessment & Plan Note (Signed)
This is due to recent treatment. I would discontinue chemotherapy today.

## 2014-02-05 NOTE — Progress Notes (Signed)
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   12/13/2013 Imaging CT scan show large mass in the right lung apex likely representing primary lung carcinoma or large metastasis. Metastases demonstrated in the pretracheal lymph nodes, T1 vertebral body and possibly sacrum.   12/14/2013 Initial Diagnosis Adenocarcinoma of upper lobe of right lung   12/14/2013 Pathology Results Accession: [id number] biopsy from right upper lobe showed adenocarcinoma   12/17/2013 Imaging MRI of the head is negative.   12/23/2013 - 02/05/2014 Radiation Therapy She received radiation therapy to the mediastinal mass and bone   12/28/2013 Imaging PET/CT scan showed mediastinal mass, lymphadenopathy and T1 involvement.   01/01/2014 - 01/29/2014 Chemotherapy Weekly carboplatin and Taxol were added    INTERVAL HISTORY: Please see below for problem oriented charting. She is seen prior to her final treatment today. She developed significant skin ulceration and esophagitis. She is rating her pain as 5 out of 10 pain but declined taking pain medication. She felt that the magic mouthwash is helping. Her insomnia was not better with mirtazapine. The patient have very poor venous access.  REVIEW OF SYSTEMS:   Constitutional: Denies fevers, chills or abnormal weight loss Eyes: Denies blurriness of vision Respiratory: Denies cough, dyspnea or  wheezes Cardiovascular: Denies palpitation, chest discomfort or lower extremity swelling Lymphatics: Denies new lymphadenopathy or easy bruising Neurological:Denies numbness, tingling or new weaknesses Behavioral/Psych: Mood is stable, no new changes  All other systems were reviewed with the patient and are negative.  I have reviewed the past medical history, past surgical history, social history and family history with the patient and they are unchanged from previous note.  ALLERGIES:  is allergic to penicillins.  MEDICATIONS:  Current Outpatient Prescriptions  Medication Sig Dispense Refill  . Alum & Mag Hydroxide-Simeth (MAGIC MOUTHWASH W/LIDOCAINE) SOLN Take 10 mLs by mouth 4 (four) times daily as needed for mouth pain. 500 mL 1  . emollient (BIAFINE) cream Apply topically as needed. (Patient taking differently: Apply 1 application topically as needed (2nd tube given 01/25/14). ) 454 g 0  . levothyroxine (SYNTHROID, LEVOTHROID) 125 MCG tablet Take 125 mcg by mouth daily.    Marland Kitchen levothyroxine (SYNTHROID, LEVOTHROID) 137 MCG tablet Take 137 mcg by mouth daily.    . mirtazapine (REMERON) 15 MG tablet Take 1 tablet (15 mg total) by mouth at bedtime. 30 tablet 3  . folic acid (FOLVITE) 1 MG tablet Take 1 tablet (1 mg total) by mouth daily. Start 5-7 days before Alimta chemotherapy. Continue until 21 days after Alimta completed. 100 tablet 3  . lidocaine-prilocaine (EMLA) cream Apply to affected area once 30 g 3  . ondansetron (ZOFRAN) 4 MG tablet Take 1 tablet (4 mg total) by mouth every 6 (six) hours as needed for nausea. (Patient not taking: Reported on 02/05/2014) 40 tablet 0  . OxyCODONE (OXYCONTIN) 10 mg T12A 12 hr tablet Take 1 tablet (  10 mg total) by mouth every 12 (twelve) hours. (Patient not taking: Reported on 02/05/2014) 60 tablet 0  . temazepam (RESTORIL) 15 MG capsule Take 1 capsule (15 mg total) by mouth at bedtime as needed for sleep. 30 capsule 0   No current facility-administered  medications for this visit.    PHYSICAL EXAMINATION: ECOG PERFORMANCE STATUS: 1 - Symptomatic but completely ambulatory  Filed Vitals:   02/05/14 1312  BP: 127/62  Pulse: 78  Temp: 98.1 F (36.7 C)  Resp: 20   Filed Weights   02/05/14 1312  Weight: 225 lb 4.8 oz (102.195 kg)    GENERAL:alert, no distress and comfortable SKIN: Significant skin ulceration is noted on the right side of the neck.  EYES: normal, Conjunctiva are pink and non-injected, sclera clear OROPHARYNX:no exudate, no erythema and lips, buccal mucosa, and tongue normal . She has no thrush ABDOMEN:abdomen soft, non-tender and normal bowel sounds Musculoskeletal:no cyanosis of digits and no clubbing  NEURO: alert & oriented x 3 with fluent speech, no focal motor/sensory deficits  LABORATORY DATA:  I have reviewed the data as listed    Component Value Date/Time   NA 141 02/05/2014 1303   NA 142 12/14/2013 0521   K 3.9 02/05/2014 1303   K 4.3 12/14/2013 0521   CL 104 12/14/2013 0521   CO2 26 02/05/2014 1303   CO2 26 12/14/2013 0521   GLUCOSE 125 02/05/2014 1303   GLUCOSE 155* 12/14/2013 0521   BUN 11.4 02/05/2014 1303   BUN 11 12/14/2013 0521   CREATININE 0.7 02/05/2014 1303   CREATININE 0.55 12/14/2013 0521   CALCIUM 9.6 02/05/2014 1303   CALCIUM 9.1 12/14/2013 0521   PROT 6.9 02/05/2014 1303   PROT 7.5 12/13/2013 2018   ALBUMIN 3.3* 02/05/2014 1303   ALBUMIN 3.7 12/13/2013 2018   AST 12 02/05/2014 1303   AST 17 12/13/2013 2018   ALT 12 02/05/2014 1303   ALT 14 12/13/2013 2018   ALKPHOS 71 02/05/2014 1303   ALKPHOS 80 12/13/2013 2018   BILITOT 0.40 02/05/2014 1303   BILITOT 0.3 12/13/2013 2018   GFRNONAA >90 12/14/2013 0521   GFRAA >90 12/14/2013 0521    No results found for: SPEP, UPEP  Lab Results  Component Value Date   WBC 3.0* 02/05/2014   NEUTROABS 2.2 02/05/2014   HGB 11.4* 02/05/2014   HCT 35.3 02/05/2014   MCV 93.1 02/05/2014   PLT 137* 02/05/2014      Chemistry       Component Value Date/Time   NA 141 02/05/2014 1303   NA 142 12/14/2013 0521   K 3.9 02/05/2014 1303   K 4.3 12/14/2013 0521   CL 104 12/14/2013 0521   CO2 26 02/05/2014 1303   CO2 26 12/14/2013 0521   BUN 11.4 02/05/2014 1303   BUN 11 12/14/2013 0521   CREATININE 0.7 02/05/2014 1303   CREATININE 0.55 12/14/2013 0521      Component Value Date/Time   CALCIUM 9.6 02/05/2014 1303   CALCIUM 9.1 12/14/2013 0521   ALKPHOS 71 02/05/2014 1303   ALKPHOS 80 12/13/2013 2018   AST 12 02/05/2014 1303   AST 17 12/13/2013 2018   ALT 12 02/05/2014 1303   ALT 14 12/13/2013 2018   BILITOT 0.40 02/05/2014 1303   BILITOT 0.3 12/13/2013 2018      ASSESSMENT & PLAN:  Adenocarcinoma of upper lobe of right lung She tolerated treatment well with expected side effects. I discontinue chemotherapy today due to significant skin ulceration around her  neck with associated pain. A month from now, I will order repeat PET CT scan to assess response to treatment. I plan to give her future maintenance treatment with Alimta. I gave her patient education handout. Due to poor venous access, we will place port in the future prior to the start of her maintenance program.   Esophagitis Improved with Pepcid. Carafate is added. She felt that the magic mouthwash is helping. I refilled her prescription. The patient declined pain medicine.  Insomnia Mirtazapine was not helpful. I gave her a prescription to try temazepam  Leukopenia due to antineoplastic chemotherapy This is due to recent treatment. I would discontinue chemotherapy today.  Radiation-induced dermatitis This is significant without sedation. She continues to put topical cream. The last day of treatment is today. I discontinue further chemotherapy and radiation therapy. This will heal within the next few weeks  My plan would be to give her Alimta in the future. Orders Placed This Encounter  Procedures  . NM PET Image Restag (PS) Skull Base To  Thigh    Standing Status: Future     Number of Occurrences:      Standing Expiration Date: 04/07/2015    Order Specific Question:  Reason for Exam (SYMPTOM  OR DIAGNOSIS REQUIRED)    Answer:  staging lung ca    Order Specific Question:  Is the patient pregnant?    Answer:  No    Order Specific Question:  Preferred imaging location?    Answer:  [facility]  . CBC with Differential    Standing Status: Standing     Number of Occurrences: 20     Standing Expiration Date: 02/06/2015  . Comprehensive metabolic panel    Standing Status: Standing     Number of Occurrences: 20     Standing Expiration Date: 02/06/2015   All questions were answered. The patient knows to call the clinic with any problems, questions or concerns. No barriers to learning was detected. I spent 30 minutes counseling the patient face to face. The total time spent in the appointment was 40 minutes and more than 50% was on counseling and review of test results     [facility], [place], MD 02/05/2014 2:03 PM

## 2014-02-05 NOTE — Assessment & Plan Note (Signed)
Mirtazapine was not helpful. I gave her a prescription to try temazepam

## 2014-02-05 NOTE — Telephone Encounter (Signed)
Lft msg with IR after receiving order for portacath to be added.... KJ

## 2014-02-05 NOTE — Assessment & Plan Note (Signed)
Improved with Pepcid. Carafate is added. She felt that the magic mouthwash is helping. I refilled her prescription. The patient declined pain medicine.

## 2014-02-05 NOTE — Assessment & Plan Note (Signed)
This is significant without sedation. She continues to put topical cream. The last day of treatment is today. I discontinue further chemotherapy and radiation therapy. This will heal within the next few weeks

## 2014-02-05 NOTE — Progress Notes (Signed)
Here before last tx rt lung, excoriated right neckl/chest, uses biafine 3-4x daily, gave another tube, uses neosporin on neck at night, difficulty swallowing, uses MMW, 84ml not helping needs to be 34ml , will ask Dr. Alvy Bimler to increas doasge, she is out,  can't tolerate mashed potatoes, applesauce tolerable, pain inside thorat 8/10 pain scale, energy level fair, no nausea, 2 week f/u appt card given 12:00 PM  11:59 AM

## 2014-02-05 NOTE — Assessment & Plan Note (Signed)
She tolerated treatment well with expected side effects. I discontinue chemotherapy today due to significant skin ulceration around her neck with associated pain. A month from now, I will order repeat PET CT scan to assess response to treatment. I plan to give her future maintenance treatment with Alimta. I gave her patient education handout. Due to poor venous access, we will place port in the future prior to the start of her maintenance program.

## 2014-02-05 NOTE — Patient Instructions (Signed)
Pemetrexed injection What is this medicine? PEMETREXED (PEM e TREX ed) is a chemotherapy drug. This medicine affects cells that are rapidly growing, such as cancer cells and cells in your mouth and stomach. It is usually used to treat lung cancers like non-small cell lung cancer and mesothelioma. It may also be used to treat other cancers. This medicine may be used for other purposes; ask your health care provider or pharmacist if you have questions. COMMON BRAND NAME(S): Alimta What should I tell my health care provider before I take this medicine? They need to know if you have any of these conditions: -if you frequently drink alcohol containing beverages -infection (especially a virus infection such as chickenpox, cold sores, or herpes) -kidney disease -liver disease -low blood counts, like low platelets, red bloods, or white blood cells -an unusual or allergic reaction to pemetrexed, mannitol, other medicines, foods, dyes, or preservatives -pregnant or trying to get pregnant -breast-feeding How should I use this medicine? This drug is given as an infusion into a vein. It is administered in a hospital or clinic by a specially trained health care professional. Talk to your pediatrician regarding the use of this medicine in children. Special care may be needed. Overdosage: If you think you have taken too much of this medicine contact a poison control center or emergency room at once. NOTE: This medicine is only for you. Do not share this medicine with others. What if I miss a dose? It is important not to miss your dose. Call your doctor or health care professional if you are unable to keep an appointment. What may interact with this medicine? -aspirin and aspirin-like medicines -medicines to increase blood counts like filgrastim, pegfilgrastim, sargramostim -methotrexate -NSAIDS, medicines for pain and inflammation, like ibuprofen or naproxen -probenecid -pyrimethamine -vaccines Talk to  your doctor or health care professional before taking any of these medicines: -acetaminophen -aspirin -ibuprofen -ketoprofen -naproxen This list may not describe all possible interactions. Give your health care provider a list of all the medicines, herbs, non-prescription drugs, or dietary supplements you use. Also tell them if you smoke, drink alcohol, or use illegal drugs. Some items may interact with your medicine. What should I watch for while using this medicine? Visit your doctor for checks on your progress. This drug may make you feel generally unwell. This is not uncommon, as chemotherapy can affect healthy cells as well as cancer cells. Report any side effects. Continue your course of treatment even though you feel ill unless your doctor tells you to stop. In some cases, you may be given additional medicines to help with side effects. Follow all directions for their use. Call your doctor or health care professional for advice if you get a fever, chills or sore throat, or other symptoms of a cold or flu. Do not treat yourself. This drug decreases your body's ability to fight infections. Try to avoid being around people who are sick. This medicine may increase your risk to bruise or bleed. Call your doctor or health care professional if you notice any unusual bleeding. Be careful brushing and flossing your teeth or using a toothpick because you may get an infection or bleed more easily. If you have any dental work done, tell your dentist you are receiving this medicine. Avoid taking products that contain aspirin, acetaminophen, ibuprofen, naproxen, or ketoprofen unless instructed by your doctor. These medicines may hide a fever. Call your doctor or health care professional if you get diarrhea or mouth sores. Do not treat  yourself. To protect your kidneys, drink water or other fluids as directed while you are taking this medicine. Men and women must use effective birth control while taking this  medicine. You may also need to continue using effective birth control for a time after stopping this medicine. Do not become pregnant while taking this medicine. Tell your doctor right away if you think that you or your partner might be pregnant. There is a potential for serious side effects to an unborn child. Talk to your health care professional or pharmacist for more information. Do not breast-feed an infant while taking this medicine. This medicine may lower sperm counts. What side effects may I notice from receiving this medicine? Side effects that you should report to your doctor or health care professional as soon as possible: -allergic reactions like skin rash, itching or hives, swelling of the face, lips, or tongue -low blood counts - this medicine may decrease the number of white blood cells, red blood cells and platelets. You may be at increased risk for infections and bleeding. -signs of infection - fever or chills, cough, sore throat, pain or difficulty passing urine -signs of decreased platelets or bleeding - bruising, pinpoint red spots on the skin, black, tarry stools, blood in the urine -signs of decreased red blood cells - unusually weak or tired, fainting spells, lightheadedness -breathing problems, like a dry cough -changes in emotions or moods -chest pain -confusion -diarrhea -high blood pressure -mouth or throat sores or ulcers -pain, swelling, warmth in the leg -pain on swallowing -swelling of the ankles, feet, hands -trouble passing urine or change in the amount of urine -vomiting -yellowing of the eyes or skin Side effects that usually do not require medical attention (report to your doctor or health care professional if they continue or are bothersome): -hair loss -loss of appetite -nausea -stomach upset This list may not describe all possible side effects. Call your doctor for medical advice about side effects. You may report side effects to FDA at  1-800-FDA-1088. Where should I keep my medicine? This drug is given in a hospital or clinic and will not be stored at home. NOTE: This sheet is a summary. It may not cover all possible information. If you have questions about this medicine, talk to your doctor, pharmacist, or health care provider.  2015, [employer]. (2007-08-19 13:24:03)

## 2014-02-09 ENCOUNTER — Telehealth: Payer: Self-pay | Admitting: *Deleted

## 2014-02-09 ENCOUNTER — Telehealth: Payer: Self-pay | Admitting: Hematology and Oncology

## 2014-02-09 NOTE — Telephone Encounter (Signed)
returned pt call and r/s appt time per pt request

## 2014-02-09 NOTE — Telephone Encounter (Signed)
Pt was concerned about having to raise her hands over her head for PET scan on 2/12 after getting PAC placed on 2/10.   Instructed pt it should not be a problem for her to raise her hands overhead for PET after PAC placement.  She verbalized understanding.  Pt also c/o ongoing "acid reflux" not relived by taking pepcid and nexium.  Asked pt if she has ever been prescribed Carafate?  Pt says she actually has some from Dr.  Lisbeth Renshaw.  She will try the carafate.  Instructed her to call back if her acid reflux does not improve.  She verbalized understanding.

## 2014-02-11 ENCOUNTER — Encounter: Payer: Self-pay | Admitting: Radiation Oncology

## 2014-02-17 ENCOUNTER — Ambulatory Visit
Admission: RE | Admit: 2014-02-17 | Discharge: 2014-02-17 | Disposition: A | Discharge status: Home / Self Care | Payer: No Typology Code available for payment source | Source: Ambulatory Visit | Attending: Radiation Oncology | Admitting: Radiation Oncology

## 2014-02-17 ENCOUNTER — Encounter: Payer: Self-pay | Admitting: Radiation Oncology

## 2014-02-17 VITALS — BP 168/68 | HR 93 | Temp 98.3°F | Resp 20 | Ht 74.0 in | Wt 221.3 lb

## 2014-02-17 DIAGNOSIS — C3411 Malignant neoplasm of upper lobe, right bronchus or lung: Secondary | ICD-10-CM

## 2014-02-17 HISTORY — DX: Personal history of irradiation: Z92.3

## 2014-02-17 HISTORY — DX: Malignant neoplasm of unspecified part of unspecified bronchus or lung: C34.90

## 2014-02-17 NOTE — Progress Notes (Signed)
Radiation Oncology         [phone] ________________________________  Name: Jasmine Grant MRN: [id number]  Date: 02/17/2014  DOB: 02/27/1955  Follow-Up Visit Note  CC: Reginia Naas, MD  Reginia Naas, *  Diagnosis:    Oncology History   Adenocarcinoma of upper lobe of right lung   Staging form: Lung, AJCC 6th Edition     Clinical stage from 12/29/2013: Stage IV (T4, N3, M1) - Signed by [name], MD on 12/29/2013       Adenocarcinoma of upper lobe of right lung   12/13/2013 - 12/17/2013 Hospital Admission She was admitted to the hospital for workup of severe pain and neurological deficit and was found to have 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   12/13/2013 Imaging CT scan show large mass in the right lung apex likely representing primary lung carcinoma or large metastasis. Metastases demonstrated in the pretracheal lymph nodes, T1 vertebral body and possibly sacrum.   12/14/2013 Initial Diagnosis Adenocarcinoma of upper lobe of right lung   12/14/2013 Pathology Results Accession: [id number] biopsy from right upper lobe showed adenocarcinoma   12/17/2013 Imaging MRI of the head is negative.   12/23/2013 - 02/05/2014 Radiation Therapy She received radiation therapy to the mediastinal mass and bone   12/28/2013 Imaging PET/CT scan showed mediastinal mass, lymphadenopathy and T1 involvement.   01/01/2014 - 01/29/2014 Chemotherapy Weekly carboplatin and Taxol were added     Narrative:  The patient returns today for routine follow-up.  The patient states she is doing better. Her esophagitis has significantly improved. She still does have some difficulties with this in terms of some more solid foods but she is swallowing liquids and many soft foods quite well. Her skin has healed significantly. Some continued fatigue but overall she feels much better.                              ALLERGIES:  is allergic to penicillins.  Meds: Current  Outpatient Prescriptions  Medication Sig Dispense Refill  . Alum & Mag Hydroxide-Simeth (MAGIC MOUTHWASH W/LIDOCAINE) SOLN Take 10 mLs by mouth 4 (four) times daily as needed for mouth pain. 500 mL 1  . emollient (BIAFINE) cream Apply topically as needed. (Patient taking differently: Apply 1 application topically as needed (2nd tube given 01/25/14). ) 627 g 0  . folic acid (FOLVITE) 1 MG tablet Take 1 tablet (1 mg total) by mouth daily. Start 5-7 days before Alimta chemotherapy. Continue until 21 days after Alimta completed. 100 tablet 3  . levothyroxine (SYNTHROID, LEVOTHROID) 125 MCG tablet Take 125 mcg by mouth daily.    Marland Kitchen levothyroxine (SYNTHROID, LEVOTHROID) 137 MCG tablet Take 137 mcg by mouth daily.    Marland Kitchen lidocaine-prilocaine (EMLA) cream Apply to affected area once 30 g 3  . mirtazapine (REMERON) 15 MG tablet Take 1 tablet (15 mg total) by mouth at bedtime. 30 tablet 3  . ondansetron (ZOFRAN) 4 MG tablet Take 1 tablet (4 mg total) by mouth every 6 (six) hours as needed for nausea. (Patient not taking: Reported on 02/05/2014) 40 tablet 0  . OxyCODONE (OXYCONTIN) 10 mg T12A 12 hr tablet Take 1 tablet (10 mg total) by mouth every 12 (twelve) hours. (Patient not taking: Reported on 02/05/2014) 60 tablet 0  . temazepam (RESTORIL) 15 MG capsule Take 1 capsule (15 mg total) by mouth at bedtime as needed for sleep. 30 capsule 0   No current  facility-administered medications for this encounter.    Physical Findings: The patient is in no acute distress. Patient is alert and oriented.  height is 6\' 2"  (1.88 m) and weight is 221 lb 4.8 oz (100.381 kg). Her oral temperature is 98.3 F (36.8 C). Her blood pressure is 168/68 and her pulse is 93. Her respiration is 20 and oxygen saturation is 100%. .   The patient's supraclavicular region shows some remaining bright erythema but this has healed significantly since treatment and should continue to heal more fully over the next 2-3 weeks.  Lab Findings: Lab  Results  Component Value Date   WBC 3.0* 02/05/2014   HGB 11.4* 02/05/2014   HCT 35.3 02/05/2014   MCV 93.1 02/05/2014   PLT 137* 02/05/2014     Radiographic Findings: No results found.  Impression:    The patient clinically is recovering from her course of chemoradiation treatment. Improving esophagitis and skin irritation. She is scheduled for additional systemic treatment through medical oncology and also will undergo another PET scan in several weeks.  Plan:  Follow-up in 4 months.   Jodelle Gross, M.D., Ph.D.

## 2014-02-17 NOTE — Progress Notes (Signed)
Follow up rt lung/rt neck, last tx 02/05/14, right neck shiny,pink, healing, very fatigued,  Is eating better  But still has to chew several times before going down, drinks fluids well, has porta acth placement 03/10/14, Pet scan 03/12/14, and starts Almita 03/16/14 and appt with Dr. Alvy Bimler , just dry cough, using cough drops, still using carafate, and started nexium daily,  10:14 AM

## 2014-02-19 NOTE — Addendum Note (Signed)
Encounter addended by: Jodelle Gross, MD on: 02/19/2014  9:12 AM<BR>     Documentation filed: Notes Section

## 2014-02-19 NOTE — Progress Notes (Signed)
  Radiation Oncology         [phone] ________________________________  Name: Jasmine Grant MRN: [id number]  Date: 02/05/2014  DOB: September 26, 1955  End of Treatment Note  Diagnosis:   Non-small cell lung cancer     Indication for treatment:  Curative       Radiation treatment dates:   12/16/2013 through 02/05/2014  Site/dose:   The patient was treated to the gross disease in the right upper chest/mediastinum. She initially was treated to a dose of 10 gray in 5 fractions using AP and PA fields. The patient then received an additional 56 gray using a IMRT technique. Daily image guidance was used. The patient's total dose was 66 gray.  Narrative: The patient tolerated radiation treatment relatively well.   The patient had some irritative symptoms including skin irritation and esophagitis towards the end of treatment.  Plan: The patient has completed radiation treatment. The patient will return to radiation oncology clinic for routine followup in one month. I advised the patient to call or return sooner if they have any questions or concerns related to their recovery or treatment. ________________________________  Jodelle Gross, M.D., Ph.D.

## 2014-02-19 NOTE — Progress Notes (Addendum)
  Radiation Oncology         [phone] ________________________________  Name: Jasmine Grant MRN: [id number]  Date: 12/15/2013  DOB: 04-03-1955  SIMULATION AND TREATMENT PLANNING NOTE  DIAGNOSIS:  Non-small cell lung cancer  Site:  Right lung/mediastinum  NARRATIVE:  The patient was brought to the [facility] suite.  Identity was confirmed.  All relevant records and images related to the planned course of therapy were reviewed.   Written consent to proceed with treatment was confirmed which was freely given after reviewing the details related to the planned course of therapy had been reviewed with the patient.  Then, the patient was set-up in a stable reproducible  supine position for radiation therapy.  CT images were obtained.  Surface markings were placed.    Medically necessary complex treatment device(s) for immobilization:  Customized accuform device.   The CT images were loaded into the planning software.  Then the target and avoidance structures were contoured.  Treatment planning then occurred.  The radiation prescription was entered and confirmed.  A total of 2 complex treatment devices were fabricated which relate to the designed radiation treatment fields which will initially be used. The patient will begin with a treatment plan consisting of AP and PA fields. She will then receive a IMRT treatment. Daily image guidance will be used throughout the entirety of the patient's treatment. A complex isodose plan is requested for the patient's initial treatment. She will receive a total of 10 gray in 5 reactions initially.   It is anticipated that the patient then will receive an additional 56 gray using a IMRT technique.  This is medically necessary due to the extent of the patient's disease which extends to the mediastinum with spinal involvement as well. This is necessary to adequately cover the target region and spare surrounding normal structures, especially with respect to  the spinal cord.  The patient will undergo daily image guidance to ensure accurate localization of the target, and adequate minimize dose to the normal surrounding structures in close proximity to the target.  PLAN:  The patient will  Ultimately receive 66 Gy in 33 fractions.   Special treatment procedure The patient will receive concurrent chemotherapy during this treatment. She may experience increased toxicity and therefore will be monitored closely for such difficulties. This therefore constitutes a special treatment procedure.  ________________________________   Jodelle Gross, MD, PhD

## 2014-02-19 NOTE — Addendum Note (Signed)
Encounter addended by: Jodelle Gross, MD on: 02/19/2014  9:08 AM<BR>     Documentation filed: Notes Section, Visit Diagnoses

## 2014-02-26 ENCOUNTER — Encounter: Payer: Self-pay | Admitting: Hematology and Oncology

## 2014-02-26 ENCOUNTER — Telehealth: Payer: Self-pay | Admitting: Hematology and Oncology

## 2014-02-26 ENCOUNTER — Telehealth: Payer: Self-pay | Admitting: *Deleted

## 2014-02-26 ENCOUNTER — Ambulatory Visit (HOSPITAL_BASED_OUTPATIENT_CLINIC_OR_DEPARTMENT_OTHER): Payer: No Typology Code available for payment source

## 2014-02-26 ENCOUNTER — Ambulatory Visit (HOSPITAL_BASED_OUTPATIENT_CLINIC_OR_DEPARTMENT_OTHER): Payer: No Typology Code available for payment source | Admitting: Hematology and Oncology

## 2014-02-26 VITALS — BP 145/65 | HR 65 | Temp 98.7°F | Resp 20 | Ht 74.0 in | Wt 216.6 lb

## 2014-02-26 DIAGNOSIS — L729 Follicular cyst of the skin and subcutaneous tissue, unspecified: Secondary | ICD-10-CM

## 2014-02-26 DIAGNOSIS — R5383 Other fatigue: Secondary | ICD-10-CM

## 2014-02-26 DIAGNOSIS — E038 Other specified hypothyroidism: Secondary | ICD-10-CM

## 2014-02-26 DIAGNOSIS — C3411 Malignant neoplasm of upper lobe, right bronchus or lung: Secondary | ICD-10-CM

## 2014-02-26 DIAGNOSIS — G4700 Insomnia, unspecified: Secondary | ICD-10-CM

## 2014-02-26 DIAGNOSIS — C7951 Secondary malignant neoplasm of bone: Secondary | ICD-10-CM

## 2014-02-26 LAB — COMPREHENSIVE METABOLIC PANEL (CC13)
ALK PHOS: 66 U/L (ref 40–150)
ALT: 9 U/L (ref 0–55)
ANION GAP: 15 meq/L — AB (ref 3–11)
AST: 11 U/L (ref 5–34)
Albumin: 3 g/dL — ABNORMAL LOW (ref 3.5–5.0)
BILIRUBIN TOTAL: 0.4 mg/dL (ref 0.20–1.20)
BUN: 8.9 mg/dL (ref 7.0–26.0)
CO2: 23 mEq/L (ref 22–29)
Calcium: 9.3 mg/dL (ref 8.4–10.4)
Chloride: 100 mEq/L (ref 98–109)
Creatinine: 0.7 mg/dL (ref 0.6–1.1)
EGFR: 90 mL/min/{1.73_m2} — ABNORMAL LOW (ref >90)
Glucose: 178 mg/dl — ABNORMAL HIGH (ref 70–140)
Potassium: 3.4 mEq/L — ABNORMAL LOW (ref 3.5–5.1)
Sodium: 138 mEq/L (ref 136–145)
Total Protein: 7.6 g/dL (ref 6.4–8.3)

## 2014-02-26 LAB — CBC WITH DIFFERENTIAL/PLATELET
BASO%: 0.2 % (ref 0.0–2.0)
BASOS ABS: 0 10*3/uL (ref 0.0–0.1)
EOS%: 1.6 % (ref 0.0–7.0)
Eosinophils Absolute: 0.1 10*3/uL (ref 0.0–0.5)
HEMATOCRIT: 35.2 % (ref 34.8–46.6)
HEMOGLOBIN: 11.6 g/dL (ref 11.6–15.9)
LYMPH#: 0.8 10*3/uL — AB (ref 0.9–3.3)
LYMPH%: 13.8 % — AB (ref 14.0–49.7)
MCH: 31.3 pg (ref 25.1–34.0)
MCHC: 33 g/dL (ref 31.5–36.0)
MCV: 94.9 fL (ref 79.5–101.0)
MONO#: 0.8 10*3/uL (ref 0.1–0.9)
MONO%: 14.7 % — ABNORMAL HIGH (ref 0.0–14.0)
NEUT#: 3.9 10*3/uL (ref 1.5–6.5)
NEUT%: 69.7 % (ref 38.4–76.8)
Platelets: 295 10*3/uL (ref 145–400)
RBC: 3.71 10*6/uL (ref 3.70–5.45)
RDW: 15.1 % — ABNORMAL HIGH (ref 11.2–14.5)
WBC: 5.6 10*3/uL (ref 3.9–10.3)

## 2014-02-26 LAB — HOLD TUBE, BLOOD BANK

## 2014-02-26 LAB — TSH CHCC: TSH: 0.069 m(IU)/L — ABNORMAL LOW (ref 0.308–3.960)

## 2014-02-26 LAB — T4, FREE: Free T4: 1.34 ng/dL (ref 0.80–1.80)

## 2014-02-26 NOTE — Assessment & Plan Note (Addendum)
This is likely related to recent treatment. I will recheck thyroid function tests. I will call patient with test results if she required dosage adjustment of her thyroid supplement. Recheck CBC show she is not anemic.

## 2014-02-26 NOTE — Telephone Encounter (Signed)
added appt for today per pof....per pof pt aware

## 2014-02-26 NOTE — Telephone Encounter (Signed)
sent pt to lab and printed avs for pt

## 2014-02-26 NOTE — Assessment & Plan Note (Signed)
This has resolved. She is no longer taking temazepam.

## 2014-02-26 NOTE — Assessment & Plan Note (Signed)
Clinically, she has no signs of disease progression. She is scheduled for imaging study next month. Continue supportive care.

## 2014-02-26 NOTE — Telephone Encounter (Signed)
Pt reports a "large marble sized" lump on her clavicle.   Dr. Alvy Bimler instructs for pt to come in this afternoon at 2 pm for evaluation.  Pt agreed.

## 2014-02-26 NOTE — Assessment & Plan Note (Signed)
She has a cyst on the skin which appeared infected. I do not believe this needs antibiotic therapy. I recommend conservative management and I suspect it would drain in the next couple days.

## 2014-02-26 NOTE — Progress Notes (Signed)
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   12/13/2013 Imaging CT scan show large mass in the right lung apex likely representing primary lung carcinoma or large metastasis. Metastases demonstrated in the pretracheal lymph nodes, T1 vertebral body and possibly sacrum.   12/14/2013 Initial Diagnosis Adenocarcinoma of upper lobe of right lung   12/14/2013 Pathology Results Accession: [id number] biopsy from right upper lobe showed adenocarcinoma   12/17/2013 Imaging MRI of the head is negative.   12/23/2013 - 02/05/2014 Radiation Therapy She received radiation therapy to the mediastinal mass and bone   12/28/2013 Imaging PET/CT scan showed mediastinal mass, lymphadenopathy and T1 involvement.   01/01/2014 - 01/29/2014 Chemotherapy Weekly carboplatin and Taxol were added    INTERVAL HISTORY: Please see below for problem oriented charting. She is seen urgently today because of an infected cyst on her skin. She complained of profound fatigue. She is mildly depressed but not suicidal. She denies any chest pain or short suppress. No recent infection.  REVIEW OF SYSTEMS:   Constitutional: Denies fevers, chills or abnormal weight loss Eyes: Denies blurriness of vision Ears, nose, mouth, throat, and face: Denies mucositis or sore throat Respiratory: Denies cough, dyspnea or wheezes Cardiovascular: Denies palpitation, chest  discomfort or lower extremity swelling Gastrointestinal:  Denies nausea, heartburn or change in bowel habits Lymphatics: Denies new lymphadenopathy or easy bruising Neurological:Denies numbness, tingling or new weaknesses Behavioral/Psych: Mood is stable, no new changes  All other systems were reviewed with the patient and are negative.  I have reviewed the past medical history, past surgical history, social history and family history with the patient and they are unchanged from previous note.  ALLERGIES:  is allergic to penicillins.  MEDICATIONS:  Current Outpatient Prescriptions  Medication Sig Dispense Refill  . Alum & Mag Hydroxide-Simeth (MAGIC MOUTHWASH W/LIDOCAINE) SOLN Take 10 mLs by mouth 4 (four) times daily as needed for mouth pain. 500 mL 1  . emollient (BIAFINE) cream Apply topically as needed. (Patient taking differently: Apply 1 application topically as needed (2nd tube given 01/25/14). ) 425 g 0  . folic acid (FOLVITE) 1 MG tablet Take 1 tablet (1 mg total) by mouth daily. Start 5-7 days before Alimta chemotherapy. Continue until 21 days after Alimta completed. 100 tablet 3  . levothyroxine (SYNTHROID, LEVOTHROID) 125 MCG tablet Take 125 mcg by mouth daily.    Marland Kitchen levothyroxine (SYNTHROID, LEVOTHROID) 137 MCG tablet Take 137 mcg by mouth daily.    Marland Kitchen lidocaine-prilocaine (EMLA) cream Apply to affected area once 30 g 3  . mirtazapine (REMERON) 15 MG tablet Take 1 tablet (15 mg total) by mouth at bedtime. 30 tablet 3  . ondansetron (ZOFRAN) 4 MG tablet Take 1 tablet (4 mg total) by mouth every 6 (six) hours as needed for nausea. 40 tablet 0  . OxyCODONE (OXYCONTIN) 10 mg T12A 12 hr tablet Take 1 tablet (10 mg total)  by mouth every 12 (twelve) hours. 60 tablet 0  . temazepam (RESTORIL) 15 MG capsule Take 1 capsule (15 mg total) by mouth at bedtime as needed for sleep. 30 capsule 0   No current facility-administered medications for this visit.    PHYSICAL EXAMINATION: ECOG  PERFORMANCE STATUS: 0 - Asymptomatic  Filed Vitals:   02/26/14 1352  BP: 145/65  Pulse: 65  Temp: 98.7 F (37.1 C)  Resp: 20   Filed Weights   02/26/14 1352  Weight: 216 lb 9.6 oz (98.249 kg)    GENERAL:alert, no distress and comfortable SKIN: She has an infected cyst on her chest wall. EYES: normal, Conjunctiva are pink and non-injected, sclera clear OROPHARYNX:no exudate, no erythema and lips, buccal mucosa, and tongue normal  NECK: supple, thyroid normal size, non-tender, without nodularity LYMPH:  no palpable lymphadenopathy in the cervical, axillary or inguinal LUNGS: clear to auscultation and percussion with normal breathing effort HEART: regular rate & rhythm and no murmurs and no lower extremity edema ABDOMEN:abdomen soft, non-tender and normal bowel sounds Musculoskeletal:no cyanosis of digits and no clubbing  NEURO: alert & oriented x 3 with fluent speech, no focal motor/sensory deficits  LABORATORY DATA:  I have reviewed the data as listed    Component Value Date/Time   NA 141 02/05/2014 1303   NA 142 12/14/2013 0521   K 3.9 02/05/2014 1303   K 4.3 12/14/2013 0521   CL 104 12/14/2013 0521   CO2 26 02/05/2014 1303   CO2 26 12/14/2013 0521   GLUCOSE 125 02/05/2014 1303   GLUCOSE 155* 12/14/2013 0521   BUN 11.4 02/05/2014 1303   BUN 11 12/14/2013 0521   CREATININE 0.7 02/05/2014 1303   CREATININE 0.55 12/14/2013 0521   CALCIUM 9.6 02/05/2014 1303   CALCIUM 9.1 12/14/2013 0521   PROT 6.9 02/05/2014 1303   PROT 7.5 12/13/2013 2018   ALBUMIN 3.3* 02/05/2014 1303   ALBUMIN 3.7 12/13/2013 2018   AST 12 02/05/2014 1303   AST 17 12/13/2013 2018   ALT 12 02/05/2014 1303   ALT 14 12/13/2013 2018   ALKPHOS 71 02/05/2014 1303   ALKPHOS 80 12/13/2013 2018   BILITOT 0.40 02/05/2014 1303   BILITOT 0.3 12/13/2013 2018   GFRNONAA >90 12/14/2013 0521   GFRAA >90 12/14/2013 0521    No results found for: SPEP, UPEP  Lab Results  Component Value Date   WBC 5.6  02/26/2014   NEUTROABS 3.9 02/26/2014   HGB 11.6 02/26/2014   HCT 35.2 02/26/2014   MCV 94.9 02/26/2014   PLT 295 02/26/2014      Chemistry      Component Value Date/Time   NA 141 02/05/2014 1303   NA 142 12/14/2013 0521   K 3.9 02/05/2014 1303   K 4.3 12/14/2013 0521   CL 104 12/14/2013 0521   CO2 26 02/05/2014 1303   CO2 26 12/14/2013 0521   BUN 11.4 02/05/2014 1303   BUN 11 12/14/2013 0521   CREATININE 0.7 02/05/2014 1303   CREATININE 0.55 12/14/2013 0521      Component Value Date/Time   CALCIUM 9.6 02/05/2014 1303   CALCIUM 9.1 12/14/2013 0521   ALKPHOS 71 02/05/2014 1303   ALKPHOS 80 12/13/2013 2018   AST 12 02/05/2014 1303   AST 17 12/13/2013 2018   ALT 12 02/05/2014 1303   ALT 14 12/13/2013 2018   BILITOT 0.40 02/05/2014 1303   BILITOT 0.3 12/13/2013 2018      ASSESSMENT & PLAN:  Adenocarcinoma of upper lobe  of right lung Clinically, she has no signs of disease progression. She is scheduled for imaging study next month. Continue supportive care.   Insomnia This has resolved. She is no longer taking temazepam.   Cyst of skin She has a cyst on the skin which appeared infected. I do not believe this needs antibiotic therapy. I recommend conservative management and I suspect it would drain in the next couple days.   Other fatigue This is likely related to recent treatment. I will recheck thyroid function tests. I will call patient with test results if she required dosage adjustment of her thyroid supplement. Recheck CBC show she is not anemic.    Orders Placed This Encounter  Procedures  . TSH    Standing Status: Future     Number of Occurrences: 1     Standing Expiration Date: 04/02/2015  . T4, free    Standing Status: Future     Number of Occurrences: 1     Standing Expiration Date: 04/02/2015  . Hold Tube, Blood Bank    Standing Status: Future     Number of Occurrences: 1     Standing Expiration Date: 04/02/2015   All questions were answered.  The patient knows to call the clinic with any problems, questions or concerns. No barriers to learning was detected. I spent 15 minutes counseling the patient face to face. The total time spent in the appointment was 20 minutes and more than 50% was on counseling and review of test results     [facility], [place], MD 02/26/2014 2:50 PM

## 2014-03-01 ENCOUNTER — Telehealth: Payer: Self-pay | Admitting: *Deleted

## 2014-03-01 NOTE — Telephone Encounter (Signed)
Left VM for pt informing her of Dr. Calton Dach note below.  Asked her to call nurse back if any questions.

## 2014-03-01 NOTE — Telephone Encounter (Signed)
-----   Message from [name], MD sent at 03/01/2014  8:30 AM EST ----- Regarding: abnormal thyroid function Please let her know it is abnormal but not needing thyroid replacement yet. I will follow up on this in the future ----- Message -----    From: [facility]    Sent: 02/26/2014   2:40 PM      To: [name], MD

## 2014-03-02 ENCOUNTER — Telehealth: Payer: Self-pay | Admitting: *Deleted

## 2014-03-02 NOTE — Telephone Encounter (Signed)
PT.'S INSURANCE IS [employer]. MEMBER #[id number]/ PHONE [phone]

## 2014-03-08 ENCOUNTER — Other Ambulatory Visit: Payer: Self-pay | Admitting: Radiology

## 2014-03-09 ENCOUNTER — Encounter: Payer: Self-pay | Admitting: Hematology and Oncology

## 2014-03-09 NOTE — Progress Notes (Signed)
Received a call from [place], Dr. Filiberto Pinks nurse, stating patient called requesting authorization for a wig. I called [employer] per Raquel Sarna [phone], wigs are not cover under the benefit plan, but stated patient can have the vendor to call for override and obtain authorization. Called patient to advise, patient stated she may need a prescription, advised patient if so to call Dr. Filiberto Pinks nurse to get a prescription.

## 2014-03-10 ENCOUNTER — Ambulatory Visit (HOSPITAL_COMMUNITY)
Admission: RE | Admit: 2014-03-10 | Discharge: 2014-03-10 | Disposition: A | Discharge status: Home / Self Care | Payer: No Typology Code available for payment source | Source: Ambulatory Visit | Attending: Hematology and Oncology | Admitting: Hematology and Oncology

## 2014-03-10 ENCOUNTER — Encounter (HOSPITAL_COMMUNITY): Payer: Self-pay

## 2014-03-10 ENCOUNTER — Other Ambulatory Visit: Payer: Self-pay | Admitting: Hematology and Oncology

## 2014-03-10 ENCOUNTER — Other Ambulatory Visit: Payer: Self-pay | Admitting: *Deleted

## 2014-03-10 DIAGNOSIS — C3411 Malignant neoplasm of upper lobe, right bronchus or lung: Secondary | ICD-10-CM

## 2014-03-10 DIAGNOSIS — L0212 Furuncle of neck: Secondary | ICD-10-CM | POA: Insufficient documentation

## 2014-03-10 LAB — CBC WITH DIFFERENTIAL/PLATELET
BASOS PCT: 0 % (ref 0–1)
Basophils Absolute: 0 10*3/uL (ref 0.0–0.1)
Eosinophils Absolute: 0.2 10*3/uL (ref 0.0–0.7)
Eosinophils Relative: 3 % (ref 0–5)
HEMATOCRIT: 35.6 % — AB (ref 36.0–46.0)
Hemoglobin: 11.3 g/dL — ABNORMAL LOW (ref 12.0–15.0)
LYMPHS ABS: 1.1 10*3/uL (ref 0.7–4.0)
Lymphocytes Relative: 14 % (ref 12–46)
MCH: 30.8 pg (ref 26.0–34.0)
MCHC: 31.7 g/dL (ref 30.0–36.0)
MCV: 97 fL (ref 78.0–100.0)
MONO ABS: 1 10*3/uL (ref 0.1–1.0)
MONOS PCT: 13 % — AB (ref 3–12)
NEUTROS ABS: 5.3 10*3/uL (ref 1.7–7.7)
Neutrophils Relative %: 70 % (ref 43–77)
Platelets: 295 10*3/uL (ref 150–400)
RBC: 3.67 MIL/uL — AB (ref 3.87–5.11)
RDW: 15.2 % (ref 11.5–15.5)
WBC: 7.6 10*3/uL (ref 4.0–10.5)

## 2014-03-10 LAB — PROTIME-INR
INR: 0.94 (ref 0.00–1.49)
PROTHROMBIN TIME: 12.7 s (ref 11.6–15.2)

## 2014-03-10 LAB — APTT: APTT: 29 s (ref 24–37)

## 2014-03-10 MED ORDER — VANCOMYCIN HCL IN DEXTROSE 1-5 GM/200ML-% IV SOLN: 1000.0000 mg | Freq: Once | INTRAVENOUS | Status: DC

## 2014-03-10 MED ORDER — SODIUM CHLORIDE 0.9 % IV SOLN
INTRAVENOUS | Status: DC
  Administered 2014-03-10: 08:00:00 via INTRAVENOUS

## 2014-03-10 NOTE — Progress Notes (Signed)
Rowe Robert, PA has seen pt, prescrition was given to pt by him and also paperwork with instructions for reschedule of port placement on 2/18.  IV site was d/c and pressure dressing applied, pt ambulatory to car for d/c.  Pt received no medications or sedation.

## 2014-03-10 NOTE — Progress Notes (Signed)
Patient ID: Jasmine Grant, female   DOB: 1956-01-29, 59 y.o.   MRN: [id number] Pt presented today for OP port a cath placement for chemotherapy (hx lung ca). On exam pt has small erythematous raised boil ant neck region which has recently drained purulent material? radiation induced vs superficial staph infection. Pt is afebrile and WBC nl. Pt was seen by Dr. Barbie Banner and he recommends course of oral antibiotic therapy before port placement to decrease risk of potentially seeding port site. Findings were also d/w Dr. Alen Blew who concurred. We will give pt prescription for Bactrim DS #14, NO REFILLS, one tablet twice daily and reschedule her for port placement next week (2/18). Dr. Calton Dach nurse aware of above also.

## 2014-03-11 ENCOUNTER — Ambulatory Visit: Payer: No Typology Code available for payment source | Admitting: Radiation Oncology

## 2014-03-12 ENCOUNTER — Other Ambulatory Visit (HOSPITAL_BASED_OUTPATIENT_CLINIC_OR_DEPARTMENT_OTHER): Payer: No Typology Code available for payment source

## 2014-03-12 ENCOUNTER — Other Ambulatory Visit: Payer: No Typology Code available for payment source

## 2014-03-12 ENCOUNTER — Ambulatory Visit (HOSPITAL_COMMUNITY)
Admission: RE | Admit: 2014-03-12 | Discharge: 2014-03-12 | Disposition: A | Discharge status: Home / Self Care | Payer: No Typology Code available for payment source | Source: Ambulatory Visit | Attending: Hematology and Oncology | Admitting: Hematology and Oncology

## 2014-03-12 ENCOUNTER — Ambulatory Visit (HOSPITAL_BASED_OUTPATIENT_CLINIC_OR_DEPARTMENT_OTHER): Payer: No Typology Code available for payment source

## 2014-03-12 ENCOUNTER — Ambulatory Visit: Payer: No Typology Code available for payment source

## 2014-03-12 DIAGNOSIS — C3411 Malignant neoplasm of upper lobe, right bronchus or lung: Secondary | ICD-10-CM | POA: Diagnosis present

## 2014-03-12 LAB — CBC WITH DIFFERENTIAL/PLATELET
BASO%: 0.6 % (ref 0.0–2.0)
Basophils Absolute: 0.1 10*3/uL (ref 0.0–0.1)
EOS%: 2.5 % (ref 0.0–7.0)
Eosinophils Absolute: 0.2 10*3/uL (ref 0.0–0.5)
HEMATOCRIT: 35.3 % (ref 34.8–46.6)
HGB: 11.4 g/dL — ABNORMAL LOW (ref 11.6–15.9)
LYMPH%: 13.4 % — ABNORMAL LOW (ref 14.0–49.7)
MCH: 30.8 pg (ref 25.1–34.0)
MCHC: 32.4 g/dL (ref 31.5–36.0)
MCV: 94.9 fL (ref 79.5–101.0)
MONO#: 0.8 10*3/uL (ref 0.1–0.9)
MONO%: 9.9 % (ref 0.0–14.0)
NEUT#: 6.2 10*3/uL (ref 1.5–6.5)
NEUT%: 73.6 % (ref 38.4–76.8)
Platelets: 312 10*3/uL (ref 145–400)
RBC: 3.72 10*6/uL (ref 3.70–5.45)
RDW: 16.4 % — AB (ref 11.2–14.5)
WBC: 8.5 10*3/uL (ref 3.9–10.3)
lymph#: 1.1 10*3/uL (ref 0.9–3.3)

## 2014-03-12 LAB — COMPREHENSIVE METABOLIC PANEL (CC13)
ALK PHOS: 75 U/L (ref 40–150)
ALT: 8 U/L (ref 0–55)
AST: 12 U/L (ref 5–34)
Albumin: 2.9 g/dL — ABNORMAL LOW (ref 3.5–5.0)
Anion Gap: 11 mEq/L (ref 3–11)
BILIRUBIN TOTAL: 0.28 mg/dL (ref 0.20–1.20)
BUN: 7.9 mg/dL (ref 7.0–26.0)
CHLORIDE: 107 meq/L (ref 98–109)
CO2: 24 mEq/L (ref 22–29)
Calcium: 9.2 mg/dL (ref 8.4–10.4)
Creatinine: 0.7 mg/dL (ref 0.6–1.1)
EGFR: 90 mL/min/{1.73_m2} — ABNORMAL LOW (ref >90)
Glucose: 110 mg/dl (ref 70–140)
POTASSIUM: 3.8 meq/L (ref 3.5–5.1)
SODIUM: 141 meq/L (ref 136–145)
Total Protein: 7.4 g/dL (ref 6.4–8.3)

## 2014-03-12 LAB — GLUCOSE, CAPILLARY: Glucose-Capillary: 150 mg/dL — ABNORMAL HIGH (ref 70–99)

## 2014-03-12 MED ORDER — FLUDEOXYGLUCOSE F - 18 (FDG) INJECTION
10.6300 | Freq: Once | INTRAVENOUS | Status: AC | PRN
  Administered 2014-03-12: 10.63 via INTRAVENOUS

## 2014-03-12 MED ORDER — CYANOCOBALAMIN 1000 MCG/ML IJ SOLN
1000.0000 ug | Freq: Once | INTRAMUSCULAR | Status: AC
  Administered 2014-03-12: 1000 ug via INTRAMUSCULAR

## 2014-03-16 ENCOUNTER — Other Ambulatory Visit: Payer: Self-pay | Admitting: Radiology

## 2014-03-16 ENCOUNTER — Telehealth: Payer: Self-pay | Admitting: Hematology and Oncology

## 2014-03-16 ENCOUNTER — Telehealth: Payer: Self-pay | Admitting: *Deleted

## 2014-03-16 ENCOUNTER — Ambulatory Visit (HOSPITAL_BASED_OUTPATIENT_CLINIC_OR_DEPARTMENT_OTHER): Payer: No Typology Code available for payment source

## 2014-03-16 ENCOUNTER — Ambulatory Visit (HOSPITAL_BASED_OUTPATIENT_CLINIC_OR_DEPARTMENT_OTHER): Payer: No Typology Code available for payment source | Admitting: Hematology and Oncology

## 2014-03-16 ENCOUNTER — Ambulatory Visit: Payer: No Typology Code available for payment source

## 2014-03-16 VITALS — BP 150/66 | HR 92 | Temp 97.9°F | Resp 20 | Ht 74.0 in | Wt 217.5 lb

## 2014-03-16 DIAGNOSIS — R5383 Other fatigue: Secondary | ICD-10-CM

## 2014-03-16 DIAGNOSIS — F411 Generalized anxiety disorder: Secondary | ICD-10-CM

## 2014-03-16 DIAGNOSIS — Z5111 Encounter for antineoplastic chemotherapy: Secondary | ICD-10-CM

## 2014-03-16 DIAGNOSIS — D6481 Anemia due to antineoplastic chemotherapy: Secondary | ICD-10-CM | POA: Insufficient documentation

## 2014-03-16 DIAGNOSIS — C3411 Malignant neoplasm of upper lobe, right bronchus or lung: Secondary | ICD-10-CM

## 2014-03-16 DIAGNOSIS — T451X5A Adverse effect of antineoplastic and immunosuppressive drugs, initial encounter: Secondary | ICD-10-CM | POA: Insufficient documentation

## 2014-03-16 DIAGNOSIS — L729 Follicular cyst of the skin and subcutaneous tissue, unspecified: Secondary | ICD-10-CM

## 2014-03-16 DIAGNOSIS — D630 Anemia in neoplastic disease: Secondary | ICD-10-CM

## 2014-03-16 MED ORDER — LORAZEPAM 2 MG/ML IJ SOLN
INTRAMUSCULAR | Status: AC
Start: 2014-03-16 — End: 2014-03-16
  Filled 2014-03-16: qty 1

## 2014-03-16 MED ORDER — DEXAMETHASONE SODIUM PHOSPHATE 10 MG/ML IJ SOLN
INTRAMUSCULAR | Status: AC
  Filled 2014-03-16: qty 1

## 2014-03-16 MED ORDER — ONDANSETRON 8 MG/50ML IVPB (CHCC)
8.0000 mg | Freq: Once | INTRAVENOUS | Status: AC
  Administered 2014-03-16: 8 mg via INTRAVENOUS

## 2014-03-16 MED ORDER — SODIUM CHLORIDE 0.9 % IV SOLN
480.0000 mg/m2 | Freq: Once | INTRAVENOUS | Status: AC
  Administered 2014-03-16: 1100 mg via INTRAVENOUS
  Filled 2014-03-16: qty 44

## 2014-03-16 MED ORDER — DEXAMETHASONE SODIUM PHOSPHATE 10 MG/ML IJ SOLN
4.0000 mg | Freq: Once | INTRAMUSCULAR | Status: AC
  Administered 2014-03-16: 4 mg via INTRAVENOUS

## 2014-03-16 MED ORDER — ONDANSETRON 8 MG/NS 50 ML IVPB
INTRAVENOUS | Status: AC
  Filled 2014-03-16: qty 8

## 2014-03-16 MED ORDER — LORAZEPAM 2 MG/ML IJ SOLN
1.0000 mg | Freq: Once | INTRAMUSCULAR | Status: AC
  Administered 2014-03-16: 1 mg via INTRAVENOUS

## 2014-03-16 MED ORDER — SODIUM CHLORIDE 0.9 % IV SOLN
Freq: Once | INTRAVENOUS | Status: AC
  Administered 2014-03-16: 14:00:00 via INTRAVENOUS

## 2014-03-16 NOTE — Assessment & Plan Note (Signed)
She has generalized anxiety and nausea with treatment. I will add premedication with Ativan which each cycle of treatment.

## 2014-03-16 NOTE — Progress Notes (Signed)
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   12/13/2013 Imaging CT scan show large mass in the right lung apex likely representing primary lung carcinoma or large metastasis. Metastases demonstrated in the pretracheal lymph nodes, T1 vertebral body and possibly sacrum.   12/14/2013 Initial Diagnosis Adenocarcinoma of upper lobe of right lung   12/14/2013 Pathology Results Accession: [id number] biopsy from right upper lobe showed adenocarcinoma   12/17/2013 Imaging MRI of the head is negative.   12/23/2013 - 02/05/2014 Radiation Therapy She received radiation therapy to the mediastinal mass and bone   12/28/2013 Imaging PET/CT scan showed mediastinal mass, lymphadenopathy and T1 involvement.   01/01/2014 - 01/29/2014 Chemotherapy Weekly carboplatin and Taxol were added   03/12/2014 Imaging Repeat PET scan showed near complete response to treatment.   03/16/2014 -  Chemotherapy She started treatment with Alimta for maintenance    INTERVAL HISTORY: Please see below for problem oriented charting. She returns prior to cycle 1 of treatment. Apart from excessive fatigue, she had no other new symptoms. Denies esophagitis. She still had mild insomnia. Denies bone pain. Denies new weakness. Her dermatitis from radiation has almost completely healed up except for one spot. Because of that, port placement was delayed.  REVIEW OF  SYSTEMS:   Constitutional: Denies fevers, chills or abnormal weight loss Eyes: Denies blurriness of vision Ears, nose, mouth, throat, and face: Denies mucositis or sore throat Respiratory: Denies cough, dyspnea or wheezes Cardiovascular: Denies palpitation, chest discomfort or lower extremity swelling Gastrointestinal:  Denies nausea, heartburn or change in bowel habits Lymphatics: Denies new lymphadenopathy or easy bruising Neurological:Denies numbness, tingling or new weaknesses Behavioral/Psych: Mood is stable, no new changes  All other systems were reviewed with the patient and are negative.  I have reviewed the past medical history, past surgical history, social history and family history with the patient and they are unchanged from previous note.  ALLERGIES:  is allergic to codeine and penicillins.  MEDICATIONS:  Current Outpatient Prescriptions  Medication Sig Dispense Refill  . Alum & Mag Hydroxide-Simeth (MAGIC MOUTHWASH W/LIDOCAINE) SOLN Take 10 mLs by mouth 4 (four) times daily as needed for mouth pain. 500 mL 1  . emollient (BIAFINE) cream Apply topically as needed. (Patient taking differently: Apply 1 application topically as needed (For radiation.). ) 128 g 0  . folic acid (FOLVITE) 1 MG tablet Take 1 tablet (1 mg total) by mouth daily. Start 5-7 days before Alimta chemotherapy. Continue until 21 days after Alimta completed. 100 tablet 3  . ibuprofen (ADVIL,MOTRIN) 200 MG tablet Take 800 mg by mouth every 6 (six) hours as needed (For pain.).    Marland Kitchen levothyroxine (SYNTHROID, LEVOTHROID) 125 MCG tablet Take 125 mcg by mouth daily.    Marland Kitchen levothyroxine (SYNTHROID, LEVOTHROID) 137 MCG tablet Take 137 mcg by mouth daily.    Marland Kitchen lidocaine-prilocaine (EMLA) cream Apply to affected area once 30 g  3  . mirtazapine (REMERON) 15 MG tablet Take 1 tablet (15 mg total) by mouth at bedtime. (Patient taking differently: Take 15 mg by mouth at bedtime as needed (For sleep.). ) 30 tablet 3  .  ondansetron (ZOFRAN) 4 MG tablet Take 1 tablet (4 mg total) by mouth every 6 (six) hours as needed for nausea. 40 tablet 0  . OxyCODONE (OXYCONTIN) 10 mg T12A 12 hr tablet Take 1 tablet (10 mg total) by mouth every 12 (twelve) hours. (Patient taking differently: Take 10 mg by mouth every 12 (twelve) hours as needed (For pain.). ) 60 tablet 0  . temazepam (RESTORIL) 15 MG capsule Take 1 capsule (15 mg total) by mouth at bedtime as needed for sleep. 30 capsule 0   No current facility-administered medications for this visit.    PHYSICAL EXAMINATION: ECOG PERFORMANCE STATUS: 1 - Symptomatic but completely ambulatory  Filed Vitals:   03/16/14 1251  BP: 150/66  Pulse: 92  Temp: 97.9 F (36.6 C)  Resp: 20   Filed Weights   03/16/14 1251  Weight: 217 lb 8 oz (98.657 kg)    GENERAL:alert, no distress and comfortable SKIN: skin color, texture, turgor are normal, no rashes or significant lesions. There is a small area of cyst with mild erythema over it. EYES: normal, Conjunctiva are pink and non-injected, sclera clear OROPHARYNX:no exudate, no erythema and lips, buccal mucosa, and tongue normal  Musculoskeletal:no cyanosis of digits and no clubbing  NEURO: alert & oriented x 3 with fluent speech, no focal motor/sensory deficits  LABORATORY DATA:  I have reviewed the data as listed    Component Value Date/Time   NA 141 03/12/2014 0817   NA 142 12/14/2013 0521   K 3.8 03/12/2014 0817   K 4.3 12/14/2013 0521   CL 104 12/14/2013 0521   CO2 24 03/12/2014 0817   CO2 26 12/14/2013 0521   GLUCOSE 110 03/12/2014 0817   GLUCOSE 155* 12/14/2013 0521   BUN 7.9 03/12/2014 0817   BUN 11 12/14/2013 0521   CREATININE 0.7 03/12/2014 0817   CREATININE 0.55 12/14/2013 0521   CALCIUM 9.2 03/12/2014 0817   CALCIUM 9.1 12/14/2013 0521   PROT 7.4 03/12/2014 0817   PROT 7.5 12/13/2013 2018   ALBUMIN 2.9* 03/12/2014 0817   ALBUMIN 3.7 12/13/2013 2018   AST 12 03/12/2014 0817   AST 17 12/13/2013  2018   ALT 8 03/12/2014 0817   ALT 14 12/13/2013 2018   ALKPHOS 75 03/12/2014 0817   ALKPHOS 80 12/13/2013 2018   BILITOT 0.28 03/12/2014 0817   BILITOT 0.3 12/13/2013 2018   GFRNONAA >90 12/14/2013 0521   GFRAA >90 12/14/2013 0521    No results found for: SPEP, UPEP  Lab Results  Component Value Date   WBC 8.5 03/12/2014   NEUTROABS 6.2 03/12/2014   HGB 11.4* 03/12/2014   HCT 35.3 03/12/2014   MCV 94.9 03/12/2014   PLT 312 03/12/2014      Chemistry      Component Value Date/Time   NA 141 03/12/2014 0817   NA 142 12/14/2013 0521   K 3.8 03/12/2014 0817   K 4.3 12/14/2013 0521   CL 104 12/14/2013 0521   CO2 24 03/12/2014 0817   CO2 26 12/14/2013 0521   BUN 7.9 03/12/2014 0817   BUN 11 12/14/2013 0521   CREATININE 0.7 03/12/2014 0817   CREATININE 0.55 12/14/2013 0521      Component Value Date/Time   CALCIUM 9.2 03/12/2014 0817   CALCIUM 9.1  12/14/2013 0521   ALKPHOS 75 03/12/2014 0817   ALKPHOS 80 12/13/2013 2018   AST 12 03/12/2014 0817   AST 17 12/13/2013 2018   ALT 8 03/12/2014 0817   ALT 14 12/13/2013 2018   BILITOT 0.28 03/12/2014 0817   BILITOT 0.3 12/13/2013 2018       RADIOGRAPHIC STUDIES: I reviewed the PET CT scan with the patient I have personally reviewed the radiological images as listed and agreed with the findings in the report.  ASSESSMENT & PLAN:  Adenocarcinoma of upper lobe of right lung We discussed the role of chemotherapy. The intent is for palliative.  We discussed some of the risks, benefits, side-effects of Alimta.   Some of the short term side-effects included, though not limited to, risk of fatigue, weight loss, pancytopenia, life-threatening infections, skin rash, allergic reactions, need for transfusions of blood products, reactivation of viral infections, nausea, vomiting, change in bowel habits, admission to hospital for various reasons, and risks of death.   Long term side-effects are also discussed including risks of  infertility, permanent damage to nerve function, chronic fatigue.   The patient is aware that the response rates discussed earlier is not guaranteed.    After a long discussion, patient made an informed decision to proceed with the prescribed plan of care.   Patient education material was dispensed. Her port placement was delay. Will use her peripheral veins for venous access. The patient is reminded to take folic acid supplementation while on Alimta.    Anemia in neoplastic disease This is likely anemia of chronic disease. The patient denies recent history of bleeding such as epistaxis, hematuria or hematochezia. She is asymptomatic from the anemia. We will observe for now.     Cyst of skin She has a tiny sebaceous cyst on the skin with very mild signs of erythema ovary. Clinically, it does not look too worrisome for infection. I will defer to the interventional radiologist to determine when would be the safest time to place a port for her.   Other fatigue This is likely related to recent treatment. Recent recheck thyroid function test was abnormal. We will recheck it again several months into treatment.    Generalized anxiety disorder She has generalized anxiety and nausea with treatment. I will add premedication with Ativan which each cycle of treatment.    No orders of the defined types were placed in this encounter.   All questions were answered. The patient knows to call the clinic with any problems, questions or concerns. No barriers to learning was detected. I spent 30 minutes counseling the patient face to face. The total time spent in the appointment was 40 minutes and more than 50% was on counseling and review of test results     [facility], [place], MD 03/16/2014 1:17 PM

## 2014-03-16 NOTE — Assessment & Plan Note (Signed)
We discussed the role of chemotherapy. The intent is for palliative.  We discussed some of the risks, benefits, side-effects of Alimta.   Some of the short term side-effects included, though not limited to, risk of fatigue, weight loss, pancytopenia, life-threatening infections, skin rash, allergic reactions, need for transfusions of blood products, reactivation of viral infections, nausea, vomiting, change in bowel habits, admission to hospital for various reasons, and risks of death.   Long term side-effects are also discussed including risks of infertility, permanent damage to nerve function, chronic fatigue.   The patient is aware that the response rates discussed earlier is not guaranteed.    After a long discussion, patient made an informed decision to proceed with the prescribed plan of care.   Patient education material was dispensed. Her port placement was delay. Will use her peripheral veins for venous access. The patient is reminded to take folic acid supplementation while on Alimta.

## 2014-03-16 NOTE — Patient Instructions (Signed)
[facility] Discharge Instructions for Patients Receiving Chemotherapy  Today you received the following chemotherapy agents Alimta.  To help prevent nausea and vomiting after your treatment, we encourage you to take your nausea medication as prescribed.   If you develop nausea and vomiting that is not controlled by your nausea medication, call the clinic.   BELOW ARE SYMPTOMS THAT SHOULD BE REPORTED IMMEDIATELY:  *FEVER GREATER THAN 100.5 F  *CHILLS WITH OR WITHOUT FEVER  NAUSEA AND VOMITING THAT IS NOT CONTROLLED WITH YOUR NAUSEA MEDICATION  *UNUSUAL SHORTNESS OF BREATH  *UNUSUAL BRUISING OR BLEEDING  TENDERNESS IN MOUTH AND THROAT WITH OR WITHOUT PRESENCE OF ULCERS  *URINARY PROBLEMS  *BOWEL PROBLEMS  UNUSUAL RASH Items with * indicate a potential emergency and should be followed up as soon as possible.  Feel free to call the clinic you have any questions or concerns. The clinic phone number is [phone].

## 2014-03-16 NOTE — Assessment & Plan Note (Signed)
This is likely anemia of chronic disease. The patient denies recent history of bleeding such as epistaxis, hematuria or hematochezia. She is asymptomatic from the anemia. We will observe for now.  

## 2014-03-16 NOTE — Assessment & Plan Note (Signed)
She has a tiny sebaceous cyst on the skin with very mild signs of erythema ovary. Clinically, it does not look too worrisome for infection. I will defer to the interventional radiologist to determine when would be the safest time to place a port for her.

## 2014-03-16 NOTE — Assessment & Plan Note (Signed)
This is likely related to recent treatment. Recent recheck thyroid function test was abnormal. We will recheck it again several months into treatment.

## 2014-03-16 NOTE — Telephone Encounter (Signed)
Per staff message and POF I have scheduled appts. Advised scheduler of appts. JMW  

## 2014-03-16 NOTE — Telephone Encounter (Signed)
Pt confirmed labs/ov per 02/16 POF, gave pt AVS.... KJ , sent msg to add chemo and adjusted MD time to later due to no time availability for labs/flush.Marland KitchenMarland KitchenMarland Kitchen

## 2014-03-17 ENCOUNTER — Telehealth: Payer: Self-pay | Admitting: *Deleted

## 2014-03-17 NOTE — Telephone Encounter (Signed)
Chemo follow up: Patient as no symptoms/question after previous chemotherapy. Instructed patient to call if they have any questions. Patient verbalized understanding.

## 2014-03-17 NOTE — Telephone Encounter (Signed)
-----   Message from Renford Dills, RN sent at 03/16/2014  2:25 PM EST ----- Regarding: chemo follow-up call 1st Alimta Dr. Alvy Bimler  [phone]

## 2014-03-18 ENCOUNTER — Inpatient Hospital Stay (HOSPITAL_COMMUNITY): Admission: RE | Admit: 2014-03-18 | Payer: No Typology Code available for payment source | Source: Ambulatory Visit

## 2014-03-18 ENCOUNTER — Ambulatory Visit (HOSPITAL_COMMUNITY): Admission: RE | Admit: 2014-03-18 | Payer: No Typology Code available for payment source | Source: Ambulatory Visit

## 2014-03-22 ENCOUNTER — Telehealth: Payer: Self-pay | Admitting: *Deleted

## 2014-03-22 ENCOUNTER — Encounter: Payer: Self-pay | Admitting: *Deleted

## 2014-03-22 NOTE — Telephone Encounter (Signed)
Call from patient reporting extending her leave of absence from December 2015 requires documentation be sent with our letter head.  The letter needs to have:  The case number [id number].    Return to work date March 14-2016.   The treatment I'm receiving.    My corporate office fax number is [phone].The fax number  Dr. Alvy Bimler said she needed to know what is needed to help with this matter.  Ms. Lapage call back number [phone].

## 2014-03-22 NOTE — Telephone Encounter (Signed)
Faxed RTW letter to [employer]

## 2014-03-22 NOTE — Telephone Encounter (Signed)
Can you take a first stab at this?

## 2014-03-25 ENCOUNTER — Other Ambulatory Visit: Payer: Self-pay | Admitting: Radiology

## 2014-03-26 ENCOUNTER — Other Ambulatory Visit: Payer: Self-pay | Admitting: Radiology

## 2014-03-29 ENCOUNTER — Ambulatory Visit: Payer: No Typology Code available for payment source | Admitting: Hematology and Oncology

## 2014-03-29 ENCOUNTER — Ambulatory Visit (HOSPITAL_COMMUNITY)
Admission: RE | Admit: 2014-03-29 | Discharge: 2014-03-29 | Disposition: A | Discharge status: Home / Self Care | Payer: No Typology Code available for payment source | Source: Ambulatory Visit | Attending: Diagnostic Radiology | Admitting: Diagnostic Radiology

## 2014-03-29 ENCOUNTER — Encounter (HOSPITAL_COMMUNITY): Payer: Self-pay

## 2014-03-29 ENCOUNTER — Ambulatory Visit (HOSPITAL_COMMUNITY)
Admission: RE | Admit: 2014-03-29 | Discharge: 2014-03-29 | Disposition: A | Discharge status: Home / Self Care | Payer: No Typology Code available for payment source | Source: Ambulatory Visit | Attending: Hematology and Oncology | Admitting: Hematology and Oncology

## 2014-03-29 DIAGNOSIS — Z79899 Other long term (current) drug therapy: Secondary | ICD-10-CM | POA: Diagnosis not present

## 2014-03-29 DIAGNOSIS — Z923 Personal history of irradiation: Secondary | ICD-10-CM | POA: Diagnosis not present

## 2014-03-29 DIAGNOSIS — C3411 Malignant neoplasm of upper lobe, right bronchus or lung: Secondary | ICD-10-CM | POA: Insufficient documentation

## 2014-03-29 DIAGNOSIS — F410 Panic disorder [episodic paroxysmal anxiety] without agoraphobia: Secondary | ICD-10-CM | POA: Insufficient documentation

## 2014-03-29 DIAGNOSIS — Z85850 Personal history of malignant neoplasm of thyroid: Secondary | ICD-10-CM | POA: Diagnosis not present

## 2014-03-29 DIAGNOSIS — Z87891 Personal history of nicotine dependence: Secondary | ICD-10-CM | POA: Diagnosis not present

## 2014-03-29 DIAGNOSIS — Z79891 Long term (current) use of opiate analgesic: Secondary | ICD-10-CM | POA: Diagnosis not present

## 2014-03-29 DIAGNOSIS — Z9089 Acquired absence of other organs: Secondary | ICD-10-CM | POA: Diagnosis not present

## 2014-03-29 LAB — CBC WITH DIFFERENTIAL/PLATELET
Basophils Absolute: 0 10*3/uL (ref 0.0–0.1)
Basophils Relative: 0 % (ref 0–1)
EOS ABS: 0.3 10*3/uL (ref 0.0–0.7)
EOS PCT: 4 % (ref 0–5)
HCT: 34.8 % — ABNORMAL LOW (ref 36.0–46.0)
Hemoglobin: 11 g/dL — ABNORMAL LOW (ref 12.0–15.0)
LYMPHS PCT: 16 % (ref 12–46)
Lymphs Abs: 1.1 10*3/uL (ref 0.7–4.0)
MCH: 31.1 pg (ref 26.0–34.0)
MCHC: 31.6 g/dL (ref 30.0–36.0)
MCV: 98.3 fL (ref 78.0–100.0)
Monocytes Absolute: 1 10*3/uL (ref 0.1–1.0)
Monocytes Relative: 15 % — ABNORMAL HIGH (ref 3–12)
NEUTROS PCT: 65 % (ref 43–77)
Neutro Abs: 4.1 10*3/uL (ref 1.7–7.7)
PLATELETS: 209 10*3/uL (ref 150–400)
RBC: 3.54 MIL/uL — AB (ref 3.87–5.11)
RDW: 15.6 % — ABNORMAL HIGH (ref 11.5–15.5)
WBC: 6.4 10*3/uL (ref 4.0–10.5)

## 2014-03-29 LAB — PROTIME-INR
INR: 0.92 (ref 0.00–1.49)
Prothrombin Time: 12.5 seconds (ref 11.6–15.2)

## 2014-03-29 MED ORDER — MIDAZOLAM HCL 2 MG/2ML IJ SOLN
INTRAMUSCULAR | Status: AC | PRN
  Administered 2014-03-29 (×5): 1 mg via INTRAVENOUS

## 2014-03-29 MED ORDER — MIDAZOLAM HCL 2 MG/2ML IJ SOLN
INTRAMUSCULAR | Status: AC
  Filled 2014-03-29: qty 6

## 2014-03-29 MED ORDER — FENTANYL CITRATE 0.05 MG/ML IJ SOLN
INTRAMUSCULAR | Status: AC | PRN
Start: 2014-03-29 — End: 2014-03-29
  Administered 2014-03-29 (×3): 25 ug via INTRAVENOUS
  Administered 2014-03-29: 50 ug via INTRAVENOUS

## 2014-03-29 MED ORDER — VANCOMYCIN HCL IN DEXTROSE 1-5 GM/200ML-% IV SOLN
1000.0000 mg | INTRAVENOUS | Status: AC
  Administered 2014-03-29: 1000 mg via INTRAVENOUS
  Filled 2014-03-29: qty 200

## 2014-03-29 MED ORDER — HEPARIN SOD (PORK) LOCK FLUSH 100 UNIT/ML IV SOLN
INTRAVENOUS | Status: AC
  Filled 2014-03-29: qty 5

## 2014-03-29 MED ORDER — SODIUM CHLORIDE 0.9 % IV SOLN
INTRAVENOUS | Status: DC
  Administered 2014-03-29: 500 mL via INTRAVENOUS

## 2014-03-29 MED ORDER — LIDOCAINE HCL 1 % IJ SOLN
INTRAMUSCULAR | Status: AC
  Filled 2014-03-29: qty 20

## 2014-03-29 MED ORDER — ONDANSETRON HCL 4 MG/2ML IJ SOLN
4.0000 mg | Freq: Once | INTRAMUSCULAR | Status: AC
Start: 2014-03-29 — End: 2014-03-29
  Administered 2014-03-29: 4 mg via INTRAVENOUS
  Filled 2014-03-29: qty 2

## 2014-03-29 MED ORDER — LIDOCAINE-EPINEPHRINE 2 %-1:100000 IJ SOLN
INTRAMUSCULAR | Status: AC
  Filled 2014-03-29: qty 1

## 2014-03-29 MED ORDER — FENTANYL CITRATE 0.05 MG/ML IJ SOLN
INTRAMUSCULAR | Status: AC
  Filled 2014-03-29: qty 4

## 2014-03-29 NOTE — Discharge Instructions (Signed)
Implanted Port Insertion, Care After °Refer to this sheet in the next few weeks. These instructions provide you with information on caring for yourself after your procedure. Your health care provider may also give you more specific instructions. Your treatment has been planned according to current medical practices, but problems sometimes occur. Call your health care provider if you have any problems or questions after your procedure. °WHAT TO EXPECT AFTER THE PROCEDURE °After your procedure, it is typical to have the following:  °· Discomfort at the port insertion site. Ice packs to the area will help. °· Bruising on the skin over the port. This will subside in 3-4 days. °HOME CARE INSTRUCTIONS °· After your port is placed, you will get a manufacturer's information card. The card has information about your port. Keep this card with you at all times.   °· Know what kind of port you have. There are many types of ports available.   °· Wear a medical alert bracelet in case of an emergency. This can help alert health care workers that you have a port.   °· The port can stay in for as long as your health care provider believes it is necessary.   °· A home health care nurse may give medicines and take care of the port.   °· You or a family member can get special training and directions for giving medicine and taking care of the port at home.   °SEEK MEDICAL CARE IF:  °· Your port does not flush or you are unable to get a blood return.   °· You have a fever or chills. °SEEK IMMEDIATE MEDICAL CARE IF: °· You have new fluid or pus coming from your incision.   °· You notice a bad smell coming from your incision site.   °· You have swelling, pain, or more redness at the incision or port site.   °· You have chest pain or shortness of breath. °Document Released: 11/05/2012 Document Revised: 01/20/2013 Document Reviewed: 11/05/2012 °ExitCare® Patient Information ©2015 [employer]. This information is not intended to replace  advice given to you by your health care provider. Make sure you discuss any questions you have with your health care provider. °Implanted Port Home Guide °An implanted port is a type of central line that is placed under the skin. Central lines are used to provide IV access when treatment or nutrition needs to be given through a person's veins. Implanted ports are used for long-term IV access. An implanted port may be placed because:  °· You need IV medicine that would be irritating to the small veins in your hands or arms.   °· You need long-term IV medicines, such as antibiotics.   °· You need IV nutrition for a long period.   °· You need frequent blood draws for lab tests.   °· You need dialysis.   °Implanted ports are usually placed in the chest area, but they can also be placed in the upper arm, the abdomen, or the leg. An implanted port has two main parts:  °· Reservoir. The reservoir is round and will appear as a small, raised area under your skin. The reservoir is the part where a needle is inserted to give medicines or draw blood.   °· Catheter. The catheter is a thin, flexible tube that extends from the reservoir. The catheter is placed into a large vein. Medicine that is inserted into the reservoir goes into the catheter and then into the vein.   °HOW WILL I CARE FOR MY INCISION SITE? °Do not get the incision site wet. Bathe or   shower as directed by your health care provider.  °HOW IS MY PORT ACCESSED? °Special steps must be taken to access the port:  °· Before the port is accessed, a numbing cream can be placed on the skin. This helps numb the skin over the port site.   °· Your health care provider uses a sterile technique to access the port. °· Your health care provider must put on a mask and sterile gloves. °· The skin over your port is cleaned carefully with an antiseptic and allowed to dry. °· The port is gently pinched between sterile gloves, and a needle is inserted into the port. °· Only  "non-coring" port needles should be used to access the port. Once the port is accessed, a blood return should be checked. This helps ensure that the port is in the vein and is not clogged.   °· If your port needs to remain accessed for a constant infusion, a clear (transparent) bandage will be placed over the needle site. The bandage and needle will need to be changed every week, or as directed by your health care provider.   °· Keep the bandage covering the needle clean and dry. Do not get it wet. Follow your health care provider's instructions on how to take a shower or bath while the port is accessed.   °· If your port does not need to stay accessed, no bandage is needed over the port.   °WHAT IS FLUSHING? °Flushing helps keep the port from getting clogged. Follow your health care provider's instructions on how and when to flush the port. Ports are usually flushed with saline solution or a medicine called heparin. The need for flushing will depend on how the port is used.  °· If the port is used for intermittent medicines or blood draws, the port will need to be flushed:   °· After medicines have been given.   °· After blood has been drawn.   °· As part of routine maintenance.   °· If a constant infusion is running, the port may not need to be flushed.   °HOW LONG WILL MY PORT STAY IMPLANTED? °The port can stay in for as long as your health care provider thinks it is needed. When it is time for the port to come out, surgery will be done to remove it. The procedure is similar to the one performed when the port was put in.  °WHEN SHOULD I SEEK IMMEDIATE MEDICAL CARE? °When you have an implanted port, you should seek immediate medical care if:  °· You notice a bad smell coming from the incision site.   °· You have swelling, redness, or drainage at the incision site.   °· You have more swelling or pain at the port site or the surrounding area.   °· You have a fever that is not controlled with medicine. °Document  Released: 01/15/2005 Document Revised: 11/05/2012 Document Reviewed: 09/22/2012 °ExitCare® Patient Information ©2015 [employer]. This information is not intended to replace advice given to you by your health care provider. Make sure you discuss any questions you have with your health care provider. °Conscious Sedation °Sedation is the use of medicines to promote relaxation and relieve discomfort and anxiety. Conscious sedation is a type of sedation. Under conscious sedation you are less alert than normal but are still able to respond to instructions or stimulation. Conscious sedation is used during short medical and dental procedures. It is milder than deep sedation or general anesthesia and allows you to return to your regular activities sooner.  °LET YOUR HEALTH CARE PROVIDER   KNOW ABOUT:  °· Any allergies you have. °· All medicines you are taking, including vitamins, herbs, eye drops, creams, and over-the-counter medicines. °· Use of steroids (by mouth or creams). °· Previous problems you or members of your family have had with the use of anesthetics. °· Any blood disorders you have. °· Previous surgeries you have had. °· Medical conditions you have. °· Possibility of pregnancy, if this applies. °· Use of cigarettes, alcohol, or illegal drugs. °RISKS AND COMPLICATIONS °Generally, this is a safe procedure. However, as with any procedure, problems can occur. Possible problems include: °· Oversedation. °· Trouble breathing on your own. You may need to have a breathing tube until you are awake and breathing on your own. °· Allergic reaction to any of the medicines used for the procedure. °BEFORE THE PROCEDURE °· You may have blood tests done. These tests can help show how well your kidneys and liver are working. They can also show how well your blood clots. °· A physical exam will be done.   °· Only take medicines as directed by your health care provider. You may need to stop taking medicines (such as blood  thinners, aspirin, or nonsteroidal anti-inflammatory drugs) before the procedure.   °· Do not eat or drink at least 6 hours before the procedure or as directed by your health care provider. °· Arrange for a responsible adult, family member, or friend to take you home after the procedure. He or she should stay with you for at least 24 hours after the procedure, until the medicine has worn off. °PROCEDURE  °· An intravenous (IV) catheter will be inserted into one of your veins. Medicine will be able to flow directly into your body through this catheter. You may be given medicine through this tube to help prevent pain and help you relax. °· The medical or dental procedure will be done. °AFTER THE PROCEDURE °· You will stay in a recovery area until the medicine has worn off. Your blood pressure and pulse will be checked.   °·  Depending on the procedure you had, you may be allowed to go home when you can tolerate liquids and your pain is under control. °Document Released: 10/10/2000 Document Revised: 01/20/2013 Document Reviewed: 09/22/2012 °ExitCare® Patient Information ©2015 [employer]. This information is not intended to replace advice given to you by your health care provider. Make sure you discuss any questions you have with your health care provider. °Conscious Sedation, Adult, Care After °Refer to this sheet in the next few weeks. These instructions provide you with information on caring for yourself after your procedure. Your health care provider may also give you more specific instructions. Your treatment has been planned according to current medical practices, but problems sometimes occur. Call your health care provider if you have any problems or questions after your procedure. °WHAT TO EXPECT AFTER THE PROCEDURE  °After your procedure: °· You may feel sleepy, clumsy, and have poor balance for several hours. °· Vomiting may occur if you eat too soon after the procedure. °HOME CARE INSTRUCTIONS °· Do not  participate in any activities where you could become injured for at least 24 hours. Do not: °¨ Drive. °¨ Swim. °¨ Ride a bicycle. °¨ Operate heavy machinery. °¨ Cook. °¨ Use power tools. °¨ Climb ladders. °¨ Work from a high place. °· Do not make important decisions or sign legal documents until you are improved. °· If you vomit, drink water, juice, or soup when you can drink without vomiting. Make sure you have little   or no nausea before eating solid foods. °· Only take over-the-counter or prescription medicines for pain, discomfort, or fever as directed by your health care provider. °· Make sure you and your family fully understand everything about the medicines given to you, including what side effects may occur. °· You should not drink alcohol, take sleeping pills, or take medicines that cause drowsiness for at least 24 hours. °· If you smoke, do not smoke without supervision. °· If you are feeling better, you may resume normal activities 24 hours after you were sedated. °· Keep all appointments with your health care provider. °SEEK MEDICAL CARE IF: °· Your skin is pale or bluish in color. °· You continue to feel nauseous or vomit. °· Your pain is getting worse and is not helped by medicine. °· You have bleeding or swelling. °· You are still sleepy or feeling clumsy after 24 hours. °SEEK IMMEDIATE MEDICAL CARE IF: °· You develop a rash. °· You have difficulty breathing. °· You develop any type of allergic problem. °· You have a fever. °MAKE SURE YOU: °· Understand these instructions. °· Will watch your condition. °· Will get help right away if you are not doing well or get worse. °Document Released: 11/05/2012 Document Reviewed: 11/05/2012 °ExitCare® Patient Information ©2015 [employer]. This information is not intended to replace advice given to you by your health care provider. Make sure you discuss any questions you have with your health care provider. ° °

## 2014-03-29 NOTE — Procedures (Signed)
Placement of right jugular port.  Tip in lower SVC. Ready to use.  No immediate complication.

## 2014-03-29 NOTE — H&P (Signed)
Chief Complaint: "I am here for port."  Referring Physician(s): Gorsuch,Ni  History of Present Illness: Jasmine Grant is a 59 y.o. 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 She has been seen by Dr. Alvy Bimler on 03/16/2014 and scheduled today for image guided port placement. She denies any chest pain, shortness of breath or palpitations. She denies any active signs of bleeding or excessive bruising. She denies any recent fever or chills. The patient denies any history of sleep apnea or chronic oxygen use. She has previously tolerated sedation without complications.   Past Medical History  Diagnosis Date  . Panic attack   . Hx of thyroid cancer     10 years ago  . Insomnia 12/29/2013  . S/P radiation therapy completed 12/23/13-02/05/14    RULL lung  . 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 Thyroidectomy      Allergies: Codeine and Penicillins  Medications: Prior to Admission medications   Medication Sig Start Date End Date Taking? Authorizing Provider  folic acid (FOLVITE) 1 MG tablet Take 1 tablet (1 mg total) by mouth daily. Start 5-7 days before Alimta chemotherapy. Continue until 21 days after Alimta completed. 02/05/14  Yes [name], MD  ibuprofen (ADVIL,MOTRIN) 200 MG tablet Take 800 mg by mouth every 6 (six) hours as needed (For pain.).   Yes Historical Provider, MD  levothyroxine (SYNTHROID, LEVOTHROID) 137 MCG tablet Take 137 mcg by mouth daily.   Yes Historical Provider, MD  temazepam (RESTORIL) 15 MG capsule Take 1 capsule (15 mg total) by mouth at bedtime as needed for sleep. 02/05/14  Yes [name], MD  Alum & Mag Hydroxide-Simeth (MAGIC MOUTHWASH W/LIDOCAINE) SOLN Take 10 mLs by mouth 4 (four) times daily as needed for mouth pain. 02/05/14   [name], MD  emollient (BIAFINE) cream Apply topically as  needed. Patient taking differently: Apply 1 application topically as needed (For radiation.).  12/18/13   Jodelle Gross, MD  levothyroxine (SYNTHROID, LEVOTHROID) 125 MCG tablet Take 125 mcg by mouth daily.    Historical Provider, MD  lidocaine-prilocaine (EMLA) cream Apply to affected area once 02/05/14   [name], MD  mirtazapine (REMERON) 15 MG tablet Take 1 tablet (15 mg total) by mouth at bedtime. Patient taking differently: Take 15 mg by mouth at bedtime as needed (For sleep.).  12/29/13   [name], MD  ondansetron (ZOFRAN) 4 MG tablet Take 1 tablet (4 mg total) by mouth every 6 (six) hours as needed for nausea. 12/17/13   Annita Brod, MD  OxyCODONE (OXYCONTIN) 10 mg T12A 12 hr tablet Take 1 tablet (10 mg total) by mouth every 12 (twelve) hours. Patient taking differently: Take 10 mg by mouth every 12 (twelve) hours as needed (For pain.).  12/17/13   Annita Brod, MD     History reviewed. No pertinent family history.  History   Social History  . Marital Status: Legally Separated    Spouse Name: N/A  . Number of Children: N/A  . Years of Education: N/A   Social History Main Topics  . Smoking status: Former Research scientist (life sciences)  . Smokeless tobacco: Never Used  . Alcohol Use: Yes  . Drug Use: No  . Sexual Activity: Not on file   Other Topics Concern  . None   Social History Narrative  Review of Systems: A 12 point ROS discussed and pertinent positives are indicated in the HPI above.  All other systems are negative.  Review of Systems  Vital Signs: T: 98.33F, HR: 68bpm, BP: 160/101 mmHg, O2: 98%RA  Physical Exam  Constitutional: She is oriented to person, place, and time. No distress.  HENT:  Head: Normocephalic and atraumatic.  Neck: No tracheal deviation present.  Cardiovascular: Normal rate and regular rhythm.  Exam reveals no gallop and no friction rub.   No murmur heard. Pulmonary/Chest: Effort normal and breath sounds normal. No respiratory distress. She has no  wheezes. She has no rales.  Abdominal: Soft. Bowel sounds are normal.  Neurological: She is alert and oriented to person, place, and time.  Skin: She is not diaphoretic.  Chest radiation site healing well without any open wounds or signs of infection.   Psychiatric: She has a normal mood and affect. Her behavior is normal. Thought content normal.    Mallampati Score:  MD Evaluation Airway: WNL Heart: WNL Abdomen: WNL Chest/ Lungs: WNL ASA  Classification: 3 Mallampati/Airway Score: Two  Imaging: Nm Pet Image Restag (ps) Skull Base To Thigh  03/12/2014   CLINICAL DATA:  Subsequent treatment strategy for Lung cancer.  EXAM: NUCLEAR MEDICINE PET SKULL BASE TO THIGH  TECHNIQUE: 10.63 mCi F-18 FDG was injected intravenously. Full-ring PET imaging was performed from the skull base to thigh after the radiotracer. CT data was obtained and used for attenuation correction and anatomic localization.  FASTING BLOOD GLUCOSE:  Value: 150 mg/dl  COMPARISON:  12/28/2013  FINDINGS: NECK  There is a stable small calcified lesion in the left carotid space which remains hypermetabolic. SUV max is 6.0 mm previously 8.7. No hypermetabolic or enlarged cervical lymph nodes.  CHEST  The large apical lung mass on the right is significantly smaller. It previously measured 5.8 x 5.3 cm and now measures 3.7 x 3.7 cm. Stable areas of calcification. SUV max has dropped from 16.7 to 6.7. The hypermetabolic pretracheal lymph node has significantly decreased in size and is no longer hypermetabolic. There is 1 small weakly hypermetabolic right hilar lymph node with SUV max of 3.7. This was previously 6.6.  There are radiation changes involving the right lung with mild diffuse hypermetabolism. No new pulmonary lesions or acute pulmonary findings. No pleural effusion.  ABDOMEN/PELVIS  No abnormal hypermetabolic activity within the liver, pancreas, adrenal glands, or spleen. No hypermetabolic lymph nodes in the abdomen or pelvis.  Stable small scattered low-attenuation liver lesions which are likely cysts and are not hypermetabolic. Stable hyperdense/hemorrhagic left lower pole renal cyst.  SKELETON  The T1 vertebral body is now all very sclerotic but no hypermetabolism. Mild hypermetabolism is noted between the L1 and L2 spinous processes. 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 No worrisome bone lesions or findings for metastatic bone disease.  IMPRESSION: A good response to therapy is demonstrated. The right upper lobe lung mass is significantly smaller and less hypermetabolic and there is only 1 small residual hypermetabolic right hilar lymph node.  Sclerotic changes involving T1, likely radiation change. No persistent hypermetabolism.  Radiation changes involving the right lung but no findings for pulmonary metastatic disease.  Persistent small calcified lesion in the left carotid space in the neck with decreased hypermetabolism.   Electronically Signed   By: Marijo Sanes M.D.   On: 03/12/2014 09:17    Labs:  CBC:  Recent Labs  02/26/14 1426 03/10/14 0750 03/12/14 0817 03/29/14 1315  WBC 5.6 7.6 8.5  6.4  HGB 11.6 11.3* 11.4* 11.0*  HCT 35.2 35.6* 35.3 34.8*  PLT 295 295 312 209    COAGS:  Recent Labs  12/14/13 1347 03/10/14 0750 03/29/14 1315  INR 1.05 0.94 0.92  APTT  --  29  --     BMP:  Recent Labs  12/13/13 2018 12/13/13 2053 12/14/13 0521  01/28/14 1303 02/05/14 1303 02/26/14 1426 03/12/14 0817  NA 139 142 142  < > 142 141 138 141  K 3.9 3.8 4.3  < > 3.9 3.9 3.4* 3.8  CL 101 103 104  --   --   --   --   --   CO2 26  --  26  < > 26 26 23 24   GLUCOSE 129* 130* 155*  < > 97 125 178* 110  BUN 10 9 11   < > 7.0 11.4 8.9 7.9  CALCIUM 9.2  --  9.1  < > 9.3 9.6 9.3 9.2  CREATININE 0.58 0.60 0.55  < > 0.7 0.7 0.7 0.7  GFRNONAA >90  --  >90  --   --   --   --   --   GFRAA >90  --  >90  --   --   --   --   --   < > = values in this interval not displayed.  LIVER FUNCTION TESTS:  Recent  Labs  01/28/14 1303 02/05/14 1303 02/26/14 1426 03/12/14 0817  BILITOT 0.50 0.40 0.40 0.28  AST 16 12 11 12   ALT 20 12 9 8   ALKPHOS 73 71 66 75  PROT 6.9 6.9 7.6 7.4  ALBUMIN 3.3* 3.3* 3.0* 2.9*   Assessment and Plan: Adenocarcinoma the right upper lobe status post radiation therapy ending on 02/05/2014 Seen by Dr. Alvy Bimler on 03/16/2014 Scheduled today for image guided port placement with moderate sedation Patient has been nothing by mouth, no blood thinners taken, afebrile, labs reviewed Risks and Benefits discussed with the patient. All of the patient's questions were answered, patient is agreeable to proceed. Consent signed and in chart.   Thank you for this interesting consult.  I greatly enjoyed meeting Jasmine Grant and look forward to participating in their care.  SignedHedy Jacob 03/29/2014, 2:22 PM   I spent a total of 20 Minutes in face to face in clinical consultation, greater than 50% of which was counseling/coordinating care for adenocarcinoma of RUL.

## 2014-04-06 ENCOUNTER — Other Ambulatory Visit (HOSPITAL_BASED_OUTPATIENT_CLINIC_OR_DEPARTMENT_OTHER): Payer: No Typology Code available for payment source

## 2014-04-06 ENCOUNTER — Ambulatory Visit (HOSPITAL_BASED_OUTPATIENT_CLINIC_OR_DEPARTMENT_OTHER): Payer: No Typology Code available for payment source

## 2014-04-06 ENCOUNTER — Telehealth: Payer: Self-pay | Admitting: Hematology and Oncology

## 2014-04-06 ENCOUNTER — Encounter: Payer: Self-pay | Admitting: *Deleted

## 2014-04-06 ENCOUNTER — Ambulatory Visit (HOSPITAL_BASED_OUTPATIENT_CLINIC_OR_DEPARTMENT_OTHER): Payer: No Typology Code available for payment source | Admitting: Hematology and Oncology

## 2014-04-06 VITALS — BP 150/81 | HR 64 | Temp 98.0°F | Resp 18 | Ht 74.0 in | Wt 226.9 lb

## 2014-04-06 DIAGNOSIS — R5383 Other fatigue: Secondary | ICD-10-CM

## 2014-04-06 DIAGNOSIS — G4700 Insomnia, unspecified: Secondary | ICD-10-CM

## 2014-04-06 DIAGNOSIS — Z5111 Encounter for antineoplastic chemotherapy: Secondary | ICD-10-CM

## 2014-04-06 DIAGNOSIS — K209 Esophagitis, unspecified without bleeding: Secondary | ICD-10-CM

## 2014-04-06 DIAGNOSIS — C3411 Malignant neoplasm of upper lobe, right bronchus or lung: Secondary | ICD-10-CM

## 2014-04-06 DIAGNOSIS — F411 Generalized anxiety disorder: Secondary | ICD-10-CM

## 2014-04-06 DIAGNOSIS — D630 Anemia in neoplastic disease: Secondary | ICD-10-CM

## 2014-04-06 LAB — CBC WITH DIFFERENTIAL/PLATELET
BASO%: 0.6 % (ref 0.0–2.0)
Basophils Absolute: 0 10*3/uL (ref 0.0–0.1)
EOS ABS: 0.3 10*3/uL (ref 0.0–0.5)
EOS%: 4 % (ref 0.0–7.0)
HEMATOCRIT: 36.4 % (ref 34.8–46.6)
HEMOGLOBIN: 11.4 g/dL — AB (ref 11.6–15.9)
LYMPH%: 18.3 % (ref 14.0–49.7)
MCH: 30.6 pg (ref 25.1–34.0)
MCHC: 31.3 g/dL — ABNORMAL LOW (ref 31.5–36.0)
MCV: 97.7 fL (ref 79.5–101.0)
MONO#: 0.7 10*3/uL (ref 0.1–0.9)
MONO%: 11.8 % (ref 0.0–14.0)
NEUT%: 65.3 % (ref 38.4–76.8)
NEUTROS ABS: 4.1 10*3/uL (ref 1.5–6.5)
Platelets: 238 10*3/uL (ref 145–400)
RBC: 3.72 10*6/uL (ref 3.70–5.45)
RDW: 16.7 % — ABNORMAL HIGH (ref 11.2–14.5)
WBC: 6.3 10*3/uL (ref 3.9–10.3)
lymph#: 1.2 10*3/uL (ref 0.9–3.3)

## 2014-04-06 LAB — COMPREHENSIVE METABOLIC PANEL (CC13)
ALT: 38 U/L (ref 0–55)
AST: 30 U/L (ref 5–34)
Albumin: 3.3 g/dL — ABNORMAL LOW (ref 3.5–5.0)
Alkaline Phosphatase: 74 U/L (ref 40–150)
Anion Gap: 12 mEq/L — ABNORMAL HIGH (ref 3–11)
BUN: 10.9 mg/dL (ref 7.0–26.0)
CALCIUM: 9.4 mg/dL (ref 8.4–10.4)
CHLORIDE: 107 meq/L (ref 98–109)
CO2: 24 mEq/L (ref 22–29)
CREATININE: 0.7 mg/dL (ref 0.6–1.1)
EGFR: >90 mL/min/{1.73_m2} (ref >90)
GLUCOSE: 138 mg/dL (ref 70–140)
POTASSIUM: 3.7 meq/L (ref 3.5–5.1)
Sodium: 142 mEq/L (ref 136–145)
Total Bilirubin: 0.49 mg/dL (ref 0.20–1.20)
Total Protein: 7.1 g/dL (ref 6.4–8.3)

## 2014-04-06 MED ORDER — HEPARIN SOD (PORK) LOCK FLUSH 100 UNIT/ML IV SOLN
500.0000 [IU] | Freq: Once | INTRAVENOUS | Status: AC | PRN
  Administered 2014-04-06: 500 [IU]
  Filled 2014-04-06: qty 5

## 2014-04-06 MED ORDER — DEXAMETHASONE SODIUM PHOSPHATE 100 MG/10ML IJ SOLN
Freq: Once | INTRAMUSCULAR | Status: AC
  Administered 2014-04-06: 14:00:00 via INTRAVENOUS
  Filled 2014-04-06: qty 4

## 2014-04-06 MED ORDER — SODIUM CHLORIDE 0.9 % IV SOLN
Freq: Once | INTRAVENOUS | Status: AC
  Administered 2014-04-06: 13:00:00 via INTRAVENOUS

## 2014-04-06 MED ORDER — SODIUM CHLORIDE 0.9 % IJ SOLN
10.0000 mL | INTRAMUSCULAR | Status: DC | PRN
  Administered 2014-04-06: 10 mL
  Filled 2014-04-06: qty 10

## 2014-04-06 MED ORDER — SODIUM CHLORIDE 0.9 % IV SOLN
480.0000 mg/m2 | Freq: Once | INTRAVENOUS | Status: AC
  Administered 2014-04-06: 1100 mg via INTRAVENOUS
  Filled 2014-04-06: qty 44

## 2014-04-06 MED ORDER — RANITIDINE HCL 150 MG PO CAPS: 150.0000 mg | ORAL_CAPSULE | Freq: Every evening | ORAL | Status: DC

## 2014-04-06 MED ORDER — LORAZEPAM 2 MG/ML IJ SOLN
INTRAMUSCULAR | Status: AC
Start: 2014-04-06 — End: 2014-04-06
  Filled 2014-04-06: qty 1

## 2014-04-06 MED ORDER — LORAZEPAM 2 MG/ML IJ SOLN
1.0000 mg | Freq: Once | INTRAMUSCULAR | Status: AC
  Administered 2014-04-06: 0.5 mg via INTRAVENOUS

## 2014-04-06 NOTE — Progress Notes (Signed)
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   12/13/2013 Imaging CT scan show large mass in the right lung apex likely representing primary lung carcinoma or large metastasis. Metastases demonstrated in the pretracheal lymph nodes, T1 vertebral body and possibly sacrum.   12/14/2013 Initial Diagnosis Adenocarcinoma of upper lobe of right lung   12/14/2013 Pathology Results Accession: [id number] biopsy from right upper lobe showed adenocarcinoma   12/17/2013 Imaging MRI of the head is negative.   12/23/2013 - 02/05/2014 Radiation Therapy She received radiation therapy to the mediastinal mass and bone   12/28/2013 Imaging PET/CT scan showed mediastinal mass, lymphadenopathy and T1 involvement.   01/01/2014 - 01/29/2014 Chemotherapy Weekly carboplatin and Taxol were added   03/12/2014 Imaging Repeat PET scan showed near complete response to treatment.   03/16/2014 -  Chemotherapy She started treatment with Alimta for maintenance    INTERVAL HISTORY: Please see below for problem oriented charting. She returns prior to cycle 2 of treatment. Her energy level has improved. She denies side effects with recent treatment. Her only complaint is increased heartburn.  REVIEW OF SYSTEMS:   Constitutional: Denies fevers, chills or abnormal weight loss Eyes: Denies blurriness of vision Ears, nose, mouth, throat, and face: Denies mucositis or  sore throat Respiratory: Denies cough, dyspnea or wheezes Cardiovascular: Denies palpitation, chest discomfort or lower extremity swelling Gastrointestinal:  Denies nausea or change in bowel habits Skin: Denies abnormal skin rashes Lymphatics: Denies new lymphadenopathy or easy bruising Neurological:Denies numbness, tingling or new weaknesses Behavioral/Psych: Mood is stable, no new changes  All other systems were reviewed with the patient and are negative.  I have reviewed the past medical history, past surgical history, social history and family history with the patient and they are unchanged from previous note.  ALLERGIES:  is allergic to codeine and penicillins.  MEDICATIONS:  Current Outpatient Prescriptions  Medication Sig Dispense Refill  . folic acid (FOLVITE) 1 MG tablet Take 1 tablet (1 mg total) by mouth daily. Start 5-7 days before Alimta chemotherapy. Continue until 21 days after Alimta completed. 100 tablet 3  . ibuprofen (ADVIL,MOTRIN) 200 MG tablet Take 800 mg by mouth every 6 (six) hours as needed (For pain.).    Marland Kitchen levothyroxine (SYNTHROID, LEVOTHROID) 125 MCG tablet Take 125 mcg by mouth daily.    Marland Kitchen levothyroxine (SYNTHROID, LEVOTHROID) 137 MCG tablet Take 137 mcg by mouth daily.    Marland Kitchen lidocaine-prilocaine (EMLA) cream Apply to affected area once 30 g 3  . mirtazapine (REMERON) 15 MG tablet Take 1 tablet (15 mg total) by mouth at bedtime. (Patient taking differently: Take 15 mg by mouth at bedtime as needed (For sleep.). ) 30 tablet 3  . ondansetron (ZOFRAN) 4 MG tablet Take 1 tablet (4 mg total) by mouth every 6 (six) hours as needed for nausea. 40 tablet 0  . ranitidine (ZANTAC) 150 MG capsule Take 1 capsule (150  mg total) by mouth every evening. 30 capsule 6  . temazepam (RESTORIL) 15 MG capsule Take 1 capsule (15 mg total) by mouth at bedtime as needed for sleep. 30 capsule 0   No current facility-administered medications for this visit.    PHYSICAL  EXAMINATION: ECOG PERFORMANCE STATUS: 0 - Asymptomatic  Filed Vitals:   04/06/14 1127  BP: 150/81  Pulse: 64  Temp: 98 F (36.7 C)  Resp: 18   Filed Weights   04/06/14 1127  Weight: 226 lb 14.4 oz (102.921 kg)    GENERAL:alert, no distress and comfortable SKIN: skin color, texture, turgor are normal, no rashes or significant lesions EYES: normal, Conjunctiva are pink and non-injected, sclera clear OROPHARYNX:no exudate, no erythema and lips, buccal mucosa, and tongue normal  NECK: supple, thyroid normal size, non-tender, without nodularity LYMPH:  no palpable lymphadenopathy in the cervical, axillary or inguinal LUNGS: clear to auscultation and percussion with normal breathing effort HEART: regular rate & rhythm and no murmurs and no lower extremity edema ABDOMEN:abdomen soft, non-tender and normal bowel sounds Musculoskeletal:no cyanosis of digits and no clubbing  NEURO: alert & oriented x 3 with fluent speech, no focal motor/sensory deficits  LABORATORY DATA:  I have reviewed the data as listed    Component Value Date/Time   NA 141 03/12/2014 0817   NA 142 12/14/2013 0521   K 3.8 03/12/2014 0817   K 4.3 12/14/2013 0521   CL 104 12/14/2013 0521   CO2 24 03/12/2014 0817   CO2 26 12/14/2013 0521   GLUCOSE 110 03/12/2014 0817   GLUCOSE 155* 12/14/2013 0521   BUN 7.9 03/12/2014 0817   BUN 11 12/14/2013 0521   CREATININE 0.7 03/12/2014 0817   CREATININE 0.55 12/14/2013 0521   CALCIUM 9.2 03/12/2014 0817   CALCIUM 9.1 12/14/2013 0521   PROT 7.4 03/12/2014 0817   PROT 7.5 12/13/2013 2018   ALBUMIN 2.9* 03/12/2014 0817   ALBUMIN 3.7 12/13/2013 2018   AST 12 03/12/2014 0817   AST 17 12/13/2013 2018   ALT 8 03/12/2014 0817   ALT 14 12/13/2013 2018   ALKPHOS 75 03/12/2014 0817   ALKPHOS 80 12/13/2013 2018   BILITOT 0.28 03/12/2014 0817   BILITOT 0.3 12/13/2013 2018   GFRNONAA >90 12/14/2013 0521   GFRAA >90 12/14/2013 0521    No results found for: SPEP,  UPEP  Lab Results  Component Value Date   WBC 6.3 04/06/2014   NEUTROABS 4.1 04/06/2014   HGB 11.4* 04/06/2014   HCT 36.4 04/06/2014   MCV 97.7 04/06/2014   PLT 238 04/06/2014      Chemistry      Component Value Date/Time   NA 141 03/12/2014 0817   NA 142 12/14/2013 0521   K 3.8 03/12/2014 0817   K 4.3 12/14/2013 0521   CL 104 12/14/2013 0521   CO2 24 03/12/2014 0817   CO2 26 12/14/2013 0521   BUN 7.9 03/12/2014 0817   BUN 11 12/14/2013 0521   CREATININE 0.7 03/12/2014 0817   CREATININE 0.55 12/14/2013 0521      Component Value Date/Time   CALCIUM 9.2 03/12/2014 0817   CALCIUM 9.1 12/14/2013 0521   ALKPHOS 75 03/12/2014 0817   ALKPHOS 80 12/13/2013 2018   AST 12 03/12/2014 0817   AST 17 12/13/2013 2018   ALT 8 03/12/2014 0817   ALT 14 12/13/2013 2018   BILITOT 0.28 03/12/2014 0817   BILITOT 0.3 12/13/2013 2018      ASSESSMENT & PLAN:  Adenocarcinoma of  upper lobe of right lung She tolerated treatment well. She have recovery of her energy level and is excited to return back to work soon. We will proceed with treatment without dose adjustment. I will see her every other treatment. She will receive retreatment today without dose adjustment.   Anemia in neoplastic disease This is likely anemia of chronic disease. The patient denies recent history of bleeding such as epistaxis, hematuria or hematochezia. She is asymptomatic from the anemia. We will observe for now.     Esophagitis I recommend a trial of Zantac at nighttime. I recommend she refrain from taking ibuprofen as this could exacerbate esophagitis.     Other fatigue This is likely related to recent treatment. Recent recheck thyroid function test was abnormal. This has improved. The patient is reassured.   Insomnia This has resolved. She is no longer taking temazepam. She will continue taking Remeron.     Generalized anxiety disorder She has generalized anxiety and nausea with treatment. I  will add premedication with Ativan which each cycle of treatment.    No orders of the defined types were placed in this encounter.   All questions were answered. The patient knows to call the clinic with any problems, questions or concerns. No barriers to learning was detected. I spent 25 minutes counseling the patient face to face. The total time spent in the appointment was 30 minutes and more than 50% was on counseling and review of test results     [facility], [place], MD 04/06/2014 11:47 AM

## 2014-04-06 NOTE — Assessment & Plan Note (Signed)
She has generalized anxiety and nausea with treatment. I will add premedication with Ativan which each cycle of treatment.

## 2014-04-06 NOTE — Patient Instructions (Signed)
[place] Discharge Instructions for Patients Receiving Chemotherapy  Today you received the following chemotherapy agents Alimta.  To help prevent nausea and vomiting after your treatment, we encourage you to take your nausea medication Zofran 8 mg as ordered.  Zantac 150 mg in the evening.   If you develop nausea and vomiting that is not controlled by your nausea medication, call the clinic.   BELOW ARE SYMPTOMS THAT SHOULD BE REPORTED IMMEDIATELY:  *FEVER GREATER THAN 100.5 F  *CHILLS WITH OR WITHOUT FEVER  NAUSEA AND VOMITING THAT IS NOT CONTROLLED WITH YOUR NAUSEA MEDICATION  *UNUSUAL SHORTNESS OF BREATH  *UNUSUAL BRUISING OR BLEEDING  TENDERNESS IN MOUTH AND THROAT WITH OR WITHOUT PRESENCE OF ULCERS  *URINARY PROBLEMS  *BOWEL PROBLEMS  UNUSUAL RASH Items with * indicate a potential emergency and should be followed up as soon as possible.  Feel free to call the clinic you have any questions or concerns. The clinic phone number is [phone].   Pemetrexed injection What is this medicine? PEMETREXED (PEM e TREX ed) is a chemotherapy drug. This medicine affects cells that are rapidly growing, such as cancer cells and cells in your mouth and stomach. It is usually used to treat lung cancers like non-small cell lung cancer and mesothelioma. It may also be used to treat other cancers. This medicine may be used for other purposes; ask your health care provider or pharmacist if you have questions. COMMON BRAND NAME(S): Alimta What should I tell my health care provider before I take this medicine? They need to know if you have any of these conditions: -if you frequently drink alcohol containing beverages -infection (especially a virus infection such as chickenpox, cold sores, or herpes) -kidney disease -liver disease -low blood counts, like low platelets, red bloods, or white blood cells -an unusual or allergic reaction to pemetrexed, mannitol, other  medicines, foods, dyes, or preservatives -pregnant or trying to get pregnant -breast-feeding How should I use this medicine? This drug is given as an infusion into a vein. It is administered in a hospital or clinic by a specially trained health care professional. Talk to your pediatrician regarding the use of this medicine in children. Special care may be needed. Overdosage: If you think you have taken too much of this medicine contact a poison control center or emergency room at once. NOTE: This medicine is only for you. Do not share this medicine with others. What if I miss a dose? It is important not to miss your dose. Call your doctor or health care professional if you are unable to keep an appointment. What may interact with this medicine? -aspirin and aspirin-like medicines -medicines to increase blood counts like filgrastim, pegfilgrastim, sargramostim -methotrexate -NSAIDS, medicines for pain and inflammation, like ibuprofen or naproxen -probenecid -pyrimethamine -vaccines Talk to your doctor or health care professional before taking any of these medicines: -acetaminophen -aspirin -ibuprofen -ketoprofen -naproxen This list may not describe all possible interactions. Give your health care provider a list of all the medicines, herbs, non-prescription drugs, or dietary supplements you use. Also tell them if you smoke, drink alcohol, or use illegal drugs. Some items may interact with your medicine. What should I watch for while using this medicine? Visit your doctor for checks on your progress. This drug may make you feel generally unwell. This is not uncommon, as chemotherapy can affect healthy cells as well as cancer cells. Report any side effects. Continue your course of treatment even though you feel ill unless your  doctor tells you to stop. In some cases, you may be given additional medicines to help with side effects. Follow all directions for their use. Call your doctor or  health care professional for advice if you get a fever, chills or sore throat, or other symptoms of a cold or flu. Do not treat yourself. This drug decreases your body's ability to fight infections. Try to avoid being around people who are sick. This medicine may increase your risk to bruise or bleed. Call your doctor or health care professional if you notice any unusual bleeding. Be careful brushing and flossing your teeth or using a toothpick because you may get an infection or bleed more easily. If you have any dental work done, tell your dentist you are receiving this medicine. Avoid taking products that contain aspirin, acetaminophen, ibuprofen, naproxen, or ketoprofen unless instructed by your doctor. These medicines may hide a fever. Call your doctor or health care professional if you get diarrhea or mouth sores. Do not treat yourself. To protect your kidneys, drink water or other fluids as directed while you are taking this medicine. Men and women must use effective birth control while taking this medicine. You may also need to continue using effective birth control for a time after stopping this medicine. Do not become pregnant while taking this medicine. Tell your doctor right away if you think that you or your partner might be pregnant. There is a potential for serious side effects to an unborn child. Talk to your health care professional or pharmacist for more information. Do not breast-feed an infant while taking this medicine. This medicine may lower sperm counts. What side effects may I notice from receiving this medicine? Side effects that you should report to your doctor or health care professional as soon as possible: -allergic reactions like skin rash, itching or hives, swelling of the face, lips, or tongue -low blood counts - this medicine may decrease the number of white blood cells, red blood cells and platelets. You may be at increased risk for infections and bleeding. -signs of  infection - fever or chills, cough, sore throat, pain or difficulty passing urine -signs of decreased platelets or bleeding - bruising, pinpoint red spots on the skin, black, tarry stools, blood in the urine -signs of decreased red blood cells - unusually weak or tired, fainting spells, lightheadedness -breathing problems, like a dry cough -changes in emotions or moods -chest pain -confusion -diarrhea -high blood pressure -mouth or throat sores or ulcers -pain, swelling, warmth in the leg -pain on swallowing -swelling of the ankles, feet, hands -trouble passing urine or change in the amount of urine -vomiting -yellowing of the eyes or skin Side effects that usually do not require medical attention (report to your doctor or health care professional if they continue or are bothersome): -hair loss -loss of appetite -nausea -stomach upset This list may not describe all possible side effects. Call your doctor for medical advice about side effects. You may report side effects to FDA at 1-800-FDA-1088. Where should I keep my medicine? This drug is given in a hospital or clinic and will not be stored at home. NOTE: This sheet is a summary. It may not cover all possible information. If you have questions about this medicine, talk to your doctor, pharmacist, or health care provider.  2015, [employer]vier/Gold Standard. (2007-08-19 13:24:03)

## 2014-04-06 NOTE — Assessment & Plan Note (Signed)
This is likely related to recent treatment. Recent recheck thyroid function test was abnormal. This has improved. The patient is reassured.

## 2014-04-06 NOTE — Assessment & Plan Note (Signed)
I recommend a trial of Zantac at nighttime. I recommend she refrain from taking ibuprofen as this could exacerbate esophagitis.

## 2014-04-06 NOTE — Assessment & Plan Note (Signed)
This is likely anemia of chronic disease. The patient denies recent history of bleeding such as epistaxis, hematuria or hematochezia. She is asymptomatic from the anemia. We will observe for now.  

## 2014-04-06 NOTE — Progress Notes (Signed)
1328  Patient asked for  Ativan 0.5 mg instead of 1 mg.  "It makes me drowsy and I have to drive home.".

## 2014-04-06 NOTE — Telephone Encounter (Signed)
gv and printed appt sched and avs for pt for March thru May

## 2014-04-06 NOTE — Progress Notes (Signed)
Discharged at 1457, ambulatory in no distress

## 2014-04-06 NOTE — Assessment & Plan Note (Signed)
She tolerated treatment well. She have recovery of her energy level and is excited to return back to work soon. We will proceed with treatment without dose adjustment. I will see her every other treatment. She will receive retreatment today without dose adjustment.

## 2014-04-06 NOTE — Assessment & Plan Note (Signed)
This has resolved. She is no longer taking temazepam. She will continue taking Remeron.

## 2014-04-12 ENCOUNTER — Telehealth: Payer: Self-pay | Admitting: *Deleted

## 2014-04-12 ENCOUNTER — Other Ambulatory Visit: Payer: Self-pay | Admitting: *Deleted

## 2014-04-12 ENCOUNTER — Other Ambulatory Visit: Payer: Self-pay | Admitting: Hematology and Oncology

## 2014-04-12 MED ORDER — DEXAMETHASONE 4 MG PO TABS: ORAL_TABLET | ORAL | Status: DC

## 2014-04-12 NOTE — Telephone Encounter (Signed)
PEr staff message I have moved appts from Tuesdays to Thrusday. I have called and gave her the first appt on 3/31

## 2014-04-12 NOTE — Telephone Encounter (Signed)
Pt left a voice mail stating "the chemo threw me for a loop", lower back pain, aching muscles and joints, fatigue, nauseated- used zofran, acid reflux- used rantidine, was in bed all day 2nd day after chemo. Wants to know if this is normal? If so, she would like to change chemo to Thursdays as she has to work.

## 2014-04-14 ENCOUNTER — Telehealth: Payer: Self-pay | Admitting: *Deleted

## 2014-04-14 NOTE — Telephone Encounter (Signed)
Onc Tx Scheduling Request sent to ask for adjustment of appointments.

## 2014-04-14 NOTE — Telephone Encounter (Signed)
No problem delaying a week but that would mean a few other appointment would need to be moved as well Jasmine Grant can assist

## 2014-04-14 NOTE — Telephone Encounter (Signed)
Jasmine Grant called to request that we move her treatment from 04/29/14 to 05/06/14 in the early morning.  She is scheduled to work on 04/29/14, but wants Dr. Alvy Bimler to let her know if it is will cause a problem moving it out a week.

## 2014-04-15 ENCOUNTER — Telehealth: Payer: Self-pay | Admitting: Hematology and Oncology

## 2014-04-15 NOTE — Telephone Encounter (Signed)
spoke with pt and advised on April appt....pt ok and aware....pt will get sched at April visit

## 2014-04-19 ENCOUNTER — Telehealth: Payer: Self-pay | Admitting: *Deleted

## 2014-04-19 ENCOUNTER — Other Ambulatory Visit: Payer: Self-pay | Admitting: *Deleted

## 2014-04-19 ENCOUNTER — Ambulatory Visit (HOSPITAL_BASED_OUTPATIENT_CLINIC_OR_DEPARTMENT_OTHER): Payer: No Typology Code available for payment source | Admitting: Nurse Practitioner

## 2014-04-19 ENCOUNTER — Ambulatory Visit (HOSPITAL_COMMUNITY)
Admission: RE | Admit: 2014-04-19 | Discharge: 2014-04-19 | Disposition: A | Discharge status: Home / Self Care | Payer: No Typology Code available for payment source | Source: Ambulatory Visit | Attending: Nurse Practitioner | Admitting: Nurse Practitioner

## 2014-04-19 VITALS — BP 160/72 | HR 70 | Temp 97.9°F | Resp 20 | Wt 230.5 lb

## 2014-04-19 DIAGNOSIS — C7951 Secondary malignant neoplasm of bone: Secondary | ICD-10-CM

## 2014-04-19 DIAGNOSIS — M7989 Other specified soft tissue disorders: Secondary | ICD-10-CM | POA: Diagnosis not present

## 2014-04-19 DIAGNOSIS — C3411 Malignant neoplasm of upper lobe, right bronchus or lung: Secondary | ICD-10-CM | POA: Diagnosis not present

## 2014-04-19 DIAGNOSIS — L03115 Cellulitis of right lower limb: Secondary | ICD-10-CM | POA: Diagnosis not present

## 2014-04-19 DIAGNOSIS — L03116 Cellulitis of left lower limb: Secondary | ICD-10-CM

## 2014-04-19 DIAGNOSIS — L03119 Cellulitis of unspecified part of limb: Secondary | ICD-10-CM

## 2014-04-19 MED ORDER — SULFAMETHOXAZOLE-TRIMETHOPRIM 800-160 MG PO TABS: 1.0000 | ORAL_TABLET | Freq: Two times a day (BID) | ORAL | Status: DC

## 2014-04-19 NOTE — Telephone Encounter (Signed)
Pt states she has had redness and swelling in both legs ( calf to foot) since Sunday. Legs are sore to touch. Is concerned. Will come in at 10:00 to see Selena Lesser, NP

## 2014-04-19 NOTE — Progress Notes (Signed)
*  PRELIMINARY RESULTS* Vascular Ultrasound Lower extremity venous duplex has been completed.  Preliminary findings: negative for DVT.  Called results to Drue Second, NP.   Landry Mellow, RDMS, RVT  04/19/2014, 1:36 PM

## 2014-04-20 ENCOUNTER — Encounter: Payer: Self-pay | Admitting: Nurse Practitioner

## 2014-04-20 DIAGNOSIS — L0390 Cellulitis, unspecified: Secondary | ICD-10-CM | POA: Insufficient documentation

## 2014-04-20 NOTE — Assessment & Plan Note (Signed)
Patient is status post radiation therapy for known bone metastasis.  She received cycle 2 of her maintenance Alimta chemotherapy regimen on 04/06/2014.  She will return on 05/06/2014 for labs and her next cycle of chemotherapy.

## 2014-04-20 NOTE — Assessment & Plan Note (Signed)
Patient has a 24-hour history of some bilateral lower extremity edema; with the right lower leg slightly more edematous than the left.  She is also noted some erythema and warmth to bilateral legs.  She denies any known injury or trauma to her legs.  She also denies any chest pain, chest pressure, shortness of breath, or pain with inspiration.  She denies any recent fevers or chills.  On exam-patient with +1 edema to bilateral lower extremities; with the right slightly larger than the left.  She also has some mild erythema and warmth to bilateral lower legs just above the ankles.  No evidence of injury or trauma.  No tenderness on exam.  No red streaks noted.  Bilateral Doppler ultrasound of lower extremities revealed no DVT.  Will treat patient for mild cellulitis to bilateral lower extremities with Bactrim antibiotic since patient is allergic to penicillins.  Also, advised patient to elevate her legs above the level of heart whenever possible; and also to consider wearing some compression socks went up and working.  Advised patient to call/return if symptoms persist or worsening whatsoever.

## 2014-04-20 NOTE — Progress Notes (Signed)
SYMPTOM MANAGEMENT CLINIC   HPI: Jasmine Grant 59 y.o. female diagnosed with lung cancer; with bone metastasis.  Patient is status post radiation therapy for known bone metastasis.  Currently undergoing maintenance Alimta chemotherapy regimen.   Patient has a 24-hour history of some bilateral lower extremity edema; with the right lower leg slightly more edematous than the left.  She is also noted some erythema and warmth to bilateral legs.  She denies any known injury or trauma to her legs.  She also denies any chest pain, chest pressure, shortness of breath, or pain with inspiration.  She denies any recent fevers or chills.  HPI  ROS  Past Medical History  Diagnosis Date  . Panic attack   . Hx of thyroid cancer     10 years ago  . Insomnia 12/29/2013  . S/P radiation therapy completed 12/23/13-02/05/14    RULL lung  . 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 Thyroidectomy      has Lung mass; Epidural mass; SVC syndrome; Pathologic compression fracture of spine; Generalized anxiety disorder; Hypothyroidism; Overweight (BMI 25.0-29.9); Elevated blood pressure; Nausea with vomiting; Adenocarcinoma of upper lobe of right lung; Constipation due to opioid therapy; Insomnia; Hypersensitivity reaction; Esophagitis; Leukopenia due to antineoplastic chemotherapy; Radiation-induced dermatitis; Cyst of skin; Other fatigue; Anemia in neoplastic disease; Adenocarcinoma of right lung; and Cellulitis on her problem list.    is allergic to codeine and penicillins.    Medication List       This list is accurate as of: 04/19/14 11:59 PM.  Always use your most recent med list.               dexamethasone 4 MG tablet  Commonly known as:  DECADRON  take 2 tabs in the morning for 2 days every 3 weeks after chemo     folic acid 1 MG tablet  Commonly known as:  FOLVITE  Take 1 tablet (1 mg  total) by mouth daily. Start 5-7 days before Alimta chemotherapy. Continue until 21 days after Alimta completed.     ibuprofen 200 MG tablet  Commonly known as:  ADVIL,MOTRIN  Take 800 mg by mouth every 6 (six) hours as needed (For pain.).     levothyroxine 137 MCG tablet  Commonly known as:  SYNTHROID, LEVOTHROID  Take 137 mcg by mouth daily.     levothyroxine 125 MCG tablet  Commonly known as:  SYNTHROID, LEVOTHROID  Take 125 mcg by mouth daily.     lidocaine-prilocaine cream  Commonly known as:  EMLA  Apply to affected area once     mirtazapine 15 MG tablet  Commonly known as:  REMERON  Take 1 tablet (15 mg total) by mouth at bedtime.     ondansetron 4 MG tablet  Commonly known as:  ZOFRAN  Take 1 tablet (4 mg total) by mouth every 6 (six) hours as needed for nausea.     ranitidine 150 MG capsule  Commonly known as:  ZANTAC  Take 1 capsule (150 mg total) by mouth every evening.     sulfamethoxazole-trimethoprim 800-160 MG per tablet  Commonly known as:  BACTRIM DS,SEPTRA DS  Take 1 tablet by mouth 2 (two) times daily.     temazepam 15 MG capsule  Commonly known as:  RESTORIL  Take 1 capsule (15 mg total) by mouth at bedtime as needed for sleep.  PHYSICAL EXAMINATION  Oncology Vitals 04/19/2014 04/06/2014 03/29/2014 03/29/2014 03/29/2014 03/29/2014 03/29/2014  Height - 188 cm - - - - -  Weight 104.554 kg 102.921 kg - - - - -  Weight (lbs) 230 lbs 8 oz 226 lbs 14 oz - - - - -  BMI (kg/m2) - 29.13 kg/m2 - - - - -  Temp 97.9 98 - 98.5 - - -  Pulse 70 64 61 68 67 69 70  Resp 20 18 16 16 13 16 14   SpO2 - 98 98 97 100 100 98  BSA (m2) - 2.32 m2 - - - - -   BP Readings from Last 3 Encounters:  04/19/14 160/72  04/06/14 150/81  03/16/14 150/66    Physical Exam  Constitutional: She is oriented to person, place, and time and well-developed, well-nourished, and in no distress.  HENT:  Head: Normocephalic and atraumatic.  Mouth/Throat: Oropharynx is clear and  moist.  Eyes: Conjunctivae and EOM are normal. Pupils are equal, round, and reactive to light. Right eye exhibits no discharge. Left eye exhibits no discharge. No scleral icterus.  Neck: Normal range of motion. Neck supple.  Pulmonary/Chest: Effort normal and breath sounds normal. No respiratory distress.  Abdominal: Soft. Bowel sounds are normal.  Musculoskeletal: Normal range of motion. She exhibits edema. She exhibits no tenderness.  +1 edema to bilateral lower legs; with the right leg slightly more edematous than the left leg.  Neurological: She is alert and oriented to person, place, and time. Gait normal.  Skin: Skin is warm and dry. No rash noted. There is erythema. No pallor.  Patient does have mild +1 edema to bilateral lower extremities; with the right leg slightly more edematous than the left.  Patient also has some mild erythema and warmth to bilateral lower extremities just above the ankles.  No tenderness to legs.  No red streaks noted.  No obvious injury noted to legs.  Psychiatric: Affect normal.  Nursing note and vitals reviewed.   LABORATORY DATA:. No visits with results within 3 Day(s) from this visit. Latest known visit with results is:  Appointment on 04/06/2014  Component Date Value Ref Range Status  . WBC 04/06/2014 6.3  3.9 - 10.3 10e3/uL Final  . NEUT# 04/06/2014 4.1  1.5 - 6.5 10e3/uL Final  . HGB 04/06/2014 11.4* 11.6 - 15.9 g/dL Final  . HCT 04/06/2014 36.4  34.8 - 46.6 % Final  . Platelets 04/06/2014 238  145 - 400 10e3/uL Final  . MCV 04/06/2014 97.7  79.5 - 101.0 fL Final  . MCH 04/06/2014 30.6  25.1 - 34.0 pg Final  . MCHC 04/06/2014 31.3* 31.5 - 36.0 g/dL Final  . RBC 04/06/2014 3.72  3.70 - 5.45 10e6/uL Final  . RDW 04/06/2014 16.7* 11.2 - 14.5 % Final  . lymph# 04/06/2014 1.2  0.9 - 3.3 10e3/uL Final  . MONO# 04/06/2014 0.7  0.1 - 0.9 10e3/uL Final  . Eosinophils Absolute 04/06/2014 0.3  0.0 - 0.5 10e3/uL Final  . Basophils Absolute 04/06/2014 0.0   0.0 - 0.1 10e3/uL Final  . NEUT% 04/06/2014 65.3  38.4 - 76.8 % Final  . LYMPH% 04/06/2014 18.3  14.0 - 49.7 % Final  . MONO% 04/06/2014 11.8  0.0 - 14.0 % Final  . EOS% 04/06/2014 4.0  0.0 - 7.0 % Final  . BASO% 04/06/2014 0.6  0.0 - 2.0 % Final  . Sodium 04/06/2014 142  136 - 145 mEq/L Final  . Potassium 04/06/2014 3.7  3.5 - 5.1  mEq/L Final  . Chloride 04/06/2014 107  98 - 109 mEq/L Final  . CO2 04/06/2014 24  22 - 29 mEq/L Final  . Glucose 04/06/2014 138  70 - 140 mg/dl Final  . BUN 04/06/2014 10.9  7.0 - 26.0 mg/dL Final  . Creatinine 04/06/2014 0.7  0.6 - 1.1 mg/dL Final  . Total Bilirubin 04/06/2014 0.49  0.20 - 1.20 mg/dL Final  . Alkaline Phosphatase 04/06/2014 74  40 - 150 U/L Final  . AST 04/06/2014 30  5 - 34 U/L Final  . ALT 04/06/2014 38  0 - 55 U/L Final  . Total Protein 04/06/2014 7.1  6.4 - 8.3 g/dL Final  . Albumin 04/06/2014 3.3* 3.5 - 5.0 g/dL Final  . Calcium 04/06/2014 9.4  8.4 - 10.4 mg/dL Final  . Anion Gap 04/06/2014 12* 3 - 11 mEq/L Final  . EGFR 04/06/2014 >90  >90 ml/min/1.73 m2 Final   eGFR is calculated using the CKD-EPI Creatinine Equation (2009)   Doppler US: Vascular Ultrasound Lower extremity venous duplex has been completed. Preliminary findings: negative for DVT.   RADIOGRAPHIC STUDIES: No results found.  ASSESSMENT/PLAN:    Adenocarcinoma of upper lobe of right lung Patient is status post radiation therapy for known bone metastasis.  She received cycle 2 of her maintenance Alimta chemotherapy regimen on 04/06/2014.  She will return on 05/06/2014 for labs and her next cycle of chemotherapy.   Cellulitis Patient has a 24-hour history of some bilateral lower extremity edema; with the right lower leg slightly more edematous than the left.  She is also noted some erythema and warmth to bilateral legs.  She denies any known injury or trauma to her legs.  She also denies any chest pain, chest pressure, shortness of breath, or pain with inspiration.   She denies any recent fevers or chills.  On exam-patient with +1 edema to bilateral lower extremities; with the right slightly larger than the left.  She also has some mild erythema and warmth to bilateral lower legs just above the ankles.  No evidence of injury or trauma.  No tenderness on exam.  No red streaks noted.  Bilateral Doppler ultrasound of lower extremities revealed no DVT.  Will treat patient for mild cellulitis to bilateral lower extremities with Bactrim antibiotic since patient is allergic to penicillins.  Also, advised patient to elevate her legs above the level of heart whenever possible; and also to consider wearing some compression socks went up and working.  Advised patient to call/return if symptoms persist or worsening whatsoever.   Patient stated understanding of all instructions; and was in agreement with this plan of care. The patient knows to call the clinic with any problems, questions or concerns.   Review/collaboration with Dr. Alvy Bimler regarding all aspects of patient's visit today.   Total time spent with patient was 40 minutes;  with greater than 75 percent of that time spent in face to face counseling regarding patient's symptoms,  and coordination of care and follow up.  Disclaimer: This note was dictated with voice recognition software. Similar sounding words can inadvertently be transcribed and may not be corrected upon review.   Drue Second, NP 04/20/2014

## 2014-04-21 ENCOUNTER — Telehealth: Payer: Self-pay

## 2014-04-21 NOTE — Telephone Encounter (Signed)
Patient reports that legs feeling and looking better today.  Swelling and redness has decreased.  Patient reports no fevers or discharge noted.  Patient told to call office if she experiences worsening symptoms.  Verbalized understanding.

## 2014-04-26 ENCOUNTER — Telehealth: Payer: Self-pay | Admitting: *Deleted

## 2014-04-26 NOTE — Telephone Encounter (Signed)
Pt will come tomorrow at 8:30 am for lab and 9 am to see Dr. Alvy Bimler.  POF sent.

## 2014-04-26 NOTE — Telephone Encounter (Signed)
I can see her either at 9 am or 2 pm. Please get labs prior to appt

## 2014-04-26 NOTE — Telephone Encounter (Signed)
THE RASH HAS SPREAD ON THE LEFT LEG. PT. WOULD LIKE TO SEE DR.GORSUCH EARLY TOMORROW MORNING IF POSSIBLE. IF PT. CAN NOT SEE DR.GORSUCH PT. WILL SEE CINDEE BACON,NP TOMORROW MORNING  THIS NOTE ROUTED TO [place] AND C[facility]O [place].

## 2014-04-27 ENCOUNTER — Ambulatory Visit: Payer: No Typology Code available for payment source

## 2014-04-27 ENCOUNTER — Encounter: Payer: Self-pay | Admitting: Hematology and Oncology

## 2014-04-27 ENCOUNTER — Other Ambulatory Visit: Payer: No Typology Code available for payment source

## 2014-04-27 ENCOUNTER — Ambulatory Visit (HOSPITAL_BASED_OUTPATIENT_CLINIC_OR_DEPARTMENT_OTHER): Payer: No Typology Code available for payment source | Admitting: Hematology and Oncology

## 2014-04-27 ENCOUNTER — Other Ambulatory Visit: Payer: Self-pay | Admitting: Hematology and Oncology

## 2014-04-27 ENCOUNTER — Other Ambulatory Visit (HOSPITAL_BASED_OUTPATIENT_CLINIC_OR_DEPARTMENT_OTHER): Payer: No Typology Code available for payment source

## 2014-04-27 VITALS — BP 175/75 | HR 66 | Temp 98.0°F | Resp 18 | Ht 74.0 in | Wt 229.1 lb

## 2014-04-27 DIAGNOSIS — M1990 Unspecified osteoarthritis, unspecified site: Secondary | ICD-10-CM

## 2014-04-27 DIAGNOSIS — R609 Edema, unspecified: Secondary | ICD-10-CM

## 2014-04-27 DIAGNOSIS — C7951 Secondary malignant neoplasm of bone: Secondary | ICD-10-CM | POA: Diagnosis not present

## 2014-04-27 DIAGNOSIS — K209 Esophagitis, unspecified without bleeding: Secondary | ICD-10-CM

## 2014-04-27 DIAGNOSIS — C3411 Malignant neoplasm of upper lobe, right bronchus or lung: Secondary | ICD-10-CM

## 2014-04-27 DIAGNOSIS — D630 Anemia in neoplastic disease: Secondary | ICD-10-CM

## 2014-04-27 DIAGNOSIS — L03818 Cellulitis of other sites: Secondary | ICD-10-CM

## 2014-04-27 DIAGNOSIS — R21 Rash and other nonspecific skin eruption: Secondary | ICD-10-CM

## 2014-04-27 DIAGNOSIS — J328 Other chronic sinusitis: Secondary | ICD-10-CM

## 2014-04-27 DIAGNOSIS — J329 Chronic sinusitis, unspecified: Secondary | ICD-10-CM

## 2014-04-27 DIAGNOSIS — I10 Essential (primary) hypertension: Secondary | ICD-10-CM

## 2014-04-27 DIAGNOSIS — K5909 Other constipation: Secondary | ICD-10-CM

## 2014-04-27 DIAGNOSIS — R600 Localized edema: Secondary | ICD-10-CM

## 2014-04-27 HISTORY — DX: Unspecified osteoarthritis, unspecified site: M19.90

## 2014-04-27 HISTORY — DX: Essential (primary) hypertension: I10

## 2014-04-27 LAB — CBC WITH DIFFERENTIAL/PLATELET
BASO%: 0.8 % (ref 0.0–2.0)
BASOS ABS: 0 10*3/uL (ref 0.0–0.1)
EOS%: 5.5 % (ref 0.0–7.0)
Eosinophils Absolute: 0.3 10*3/uL (ref 0.0–0.5)
HCT: 33.2 % — ABNORMAL LOW (ref 34.8–46.6)
HEMOGLOBIN: 10.8 g/dL — AB (ref 11.6–15.9)
LYMPH%: 15.8 % (ref 14.0–49.7)
MCH: 31.6 pg (ref 25.1–34.0)
MCHC: 32.4 g/dL (ref 31.5–36.0)
MCV: 97.7 fL (ref 79.5–101.0)
MONO#: 0.7 10*3/uL (ref 0.1–0.9)
MONO%: 12.7 % (ref 0.0–14.0)
NEUT%: 65.2 % (ref 38.4–76.8)
NEUTROS ABS: 3.4 10*3/uL (ref 1.5–6.5)
Platelets: 258 10*3/uL (ref 145–400)
RBC: 3.4 10*6/uL — AB (ref 3.70–5.45)
RDW: 14.9 % — ABNORMAL HIGH (ref 11.2–14.5)
WBC: 5.2 10*3/uL (ref 3.9–10.3)
lymph#: 0.8 10*3/uL — ABNORMAL LOW (ref 0.9–3.3)

## 2014-04-27 LAB — COMPREHENSIVE METABOLIC PANEL (CC13)
ALBUMIN: 3.3 g/dL — AB (ref 3.5–5.0)
ALT: 29 U/L (ref 0–55)
AST: 30 U/L (ref 5–34)
Alkaline Phosphatase: 76 U/L (ref 40–150)
Anion Gap: 9 mEq/L (ref 3–11)
BUN: 7.1 mg/dL (ref 7.0–26.0)
CALCIUM: 8.9 mg/dL (ref 8.4–10.4)
CO2: 24 mEq/L (ref 22–29)
Chloride: 110 mEq/L — ABNORMAL HIGH (ref 98–109)
Creatinine: 0.8 mg/dL (ref 0.6–1.1)
EGFR: 84 mL/min/{1.73_m2} — AB (ref >90)
Glucose: 125 mg/dl (ref 70–140)
Potassium: 3.9 mEq/L (ref 3.5–5.1)
SODIUM: 143 meq/L (ref 136–145)
TOTAL PROTEIN: 7 g/dL (ref 6.4–8.3)
Total Bilirubin: 0.36 mg/dL (ref 0.20–1.20)

## 2014-04-27 MED ORDER — ACETAMINOPHEN-CODEINE 300-30 MG PO TABS: 1.0000 | ORAL_TABLET | ORAL | Status: DC | PRN

## 2014-04-27 NOTE — Assessment & Plan Note (Signed)
She has signs and symptoms of chronic sinusitis/congestion likely induced by seasonal change/allergy. Clinically, she does not appear to be infected. She has completed a course of antibiotics recently. I recommend over-the-counter decongestants. The Tylenol with Codeine that I am prescribing for her pain should also help with the cough. I reassured the patient.

## 2014-04-27 NOTE — Assessment & Plan Note (Signed)
She has been taking NSAID for chronic arthritis pain. She is intolerant to pain medicine. I recommend a trial of Tylenol with Codeine that might also help with her cough. She agreed to proceed.

## 2014-04-27 NOTE — Assessment & Plan Note (Signed)
Overall, she tolerated treatment well. Continue with that dose adjustment.

## 2014-04-27 NOTE — Assessment & Plan Note (Signed)
She was treated with antibiotics recently for cellulitis. There is a very mild faint rash in her lower extremity but overall I felt that it is probably improving. I recommend observation only.

## 2014-04-27 NOTE — Assessment & Plan Note (Signed)
This is related to fluid retention from excessive ingestion of NSAID. I recommend elastic compression hose.

## 2014-04-27 NOTE — Assessment & Plan Note (Signed)
I recommend a trial of Zantac at nighttime. I recommend she refrain from taking ibuprofen as this could exacerbate esophagitis.

## 2014-04-27 NOTE — Assessment & Plan Note (Signed)
She has significant high blood pressure today. The patient attributed that to stress. She is also taking Allegra NSAID that could also cause hypertension. I recommend the patient to stop taking NSAID. I will monitor her blood pressure closely in the next visit.

## 2014-04-27 NOTE — Assessment & Plan Note (Signed)
The patient has chronic constipation. The Tylenol with Codeine could potentially exacerbate that. I recommend she continue to use laxatives as needed to regulate her bowel habits.

## 2014-04-27 NOTE — Assessment & Plan Note (Signed)
This is likely anemia of chronic disease. The patient denies recent history of bleeding such as epistaxis, hematuria or hematochezia. She is asymptomatic from the anemia. We will observe for now.  

## 2014-04-27 NOTE — Progress Notes (Signed)
[facility] OFFICE PROGRESS NOTE  Patient Care Team: Carol Ada, MD as PCP - General (Family Medicine)  SUMMARY OF ONCOLOGIC HISTORY: Oncology History   Adenocarcinoma of upper lobe of right lung   Staging form: Lung, AJCC 6th Edition     Clinical stage from 12/29/2013: Stage IV (T4, N3, M1) - Signed by [name], MD on 12/29/2013       Adenocarcinoma of upper lobe of right lung   12/13/2013 - 12/17/2013 Hospital Admission She was admitted to the hospital for workup of severe pain and neurological deficit and was found to have 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   12/13/2013 Imaging CT scan show large mass in the right lung apex likely representing primary lung carcinoma or large metastasis. Metastases demonstrated in the pretracheal lymph nodes, T1 vertebral body and possibly sacrum.   12/14/2013 Initial Diagnosis Adenocarcinoma of upper lobe of right lung   12/14/2013 Pathology Results Accession: [id number] biopsy from right upper lobe showed adenocarcinoma   12/17/2013 Imaging MRI of the head is negative.   12/23/2013 - 02/05/2014 Radiation Therapy She received radiation therapy to the mediastinal mass and bone   12/28/2013 Imaging PET/CT scan showed mediastinal mass, lymphadenopathy and T1 involvement.   01/01/2014 - 01/29/2014 Chemotherapy Weekly carboplatin and Taxol were added   03/12/2014 Imaging Repeat PET scan showed near complete response to treatment.   03/16/2014 -  Chemotherapy She started treatment with Alimta for maintenance    INTERVAL HISTORY: Please see below for problem oriented charting. She is seen today as an urgent add-on due to concern for rash, cough, leg swelling and low-grade fever. She complained a lot of nasal drainage. Lastly, she was seen by a nurse practitioner for lower leg skin rash, suspicious for possible cellulitis. She was prescribed a course of antibiotic treatment. On further questioning, the patient was  complaining of chronic arthritis pain and had been taking a lot of ibuprofen. She have chronic heartburn and mild constipation. Overall, she tolerated treatment well. She had mild leg edema bilaterally.  REVIEW OF SYSTEMS:   Constitutional: Denies fevers, chills or abnormal weight loss Eyes: Denies blurriness of vision Ears, nose, mouth, throat, and face: Denies mucositis or sore throat Cardiovascular: Denies palpitation, chest discomfort  Skin: Denies abnormal skin rashes Lymphatics: Denies new lymphadenopathy or easy bruising Neurological:Denies numbness, tingling or new weaknesses Behavioral/Psych: Mood is stable, no new changes  All other systems were reviewed with the patient and are negative.  I have reviewed the past medical history, past surgical history, social history and family history with the patient and they are unchanged from previous note.  ALLERGIES:  is allergic to codeine and penicillins.  MEDICATIONS:  Current Outpatient Prescriptions  Medication Sig Dispense Refill  . dexamethasone (DECADRON) 4 MG tablet take 2 tabs in the morning for 2 days every 3 weeks after chemo 30 tablet 1  . folic acid (FOLVITE) 1 MG tablet Take 1 tablet (1 mg total) by mouth daily. Start 5-7 days before Alimta chemotherapy. Continue until 21 days after Alimta completed. 100 tablet 3  . ibuprofen (ADVIL,MOTRIN) 200 MG tablet Take 800 mg by mouth every 6 (six) hours as needed (For pain.).    Marland Kitchen levothyroxine (SYNTHROID, LEVOTHROID) 125 MCG tablet Take 125 mcg by mouth daily.    Marland Kitchen levothyroxine (SYNTHROID, LEVOTHROID) 137 MCG tablet Take 137 mcg by mouth daily.    Marland Kitchen lidocaine-prilocaine (EMLA) cream Apply to affected area once 30 g 3  . mirtazapine (REMERON)  15 MG tablet Take 1 tablet (15 mg total) by mouth at bedtime. (Patient taking differently: Take 15 mg by mouth at bedtime as needed (For sleep.). ) 30 tablet 3  . ondansetron (ZOFRAN) 4 MG tablet Take 1 tablet (4 mg total) by mouth every 6  (six) hours as needed for nausea. 40 tablet 0  . ranitidine (ZANTAC) 150 MG capsule Take 1 capsule (150 mg total) by mouth every evening. 30 capsule 6  . temazepam (RESTORIL) 15 MG capsule Take 1 capsule (15 mg total) by mouth at bedtime as needed for sleep. 30 capsule 0  . Acetaminophen-Codeine 300-30 MG per tablet Take 1 tablet by mouth every 4 (four) hours as needed for pain. 30 tablet 0   No current facility-administered medications for this visit.    PHYSICAL EXAMINATION: ECOG PERFORMANCE STATUS: 1 - Symptomatic but completely ambulatory  Filed Vitals:   04/27/14 0859  BP: 175/75  Pulse: 66  Temp: 98 F (36.7 C)  Resp: 18   Filed Weights   04/27/14 0859  Weight: 229 lb 1.6 oz (103.919 kg)    GENERAL:alert, no distress and comfortable SKIN: skin color, texture, turgor are normal, no rashes or significant lesions. She had very mild faint rash in bilateral anterior shin area. EYES: normal, Conjunctiva are pink and non-injected, sclera clear OROPHARYNX:no exudate, no erythema and lips, buccal mucosa, and tongue normal  NECK: supple, thyroid normal size, non-tender, without nodularity LYMPH:  no palpable lymphadenopathy in the cervical, axillary or inguinal LUNGS: clear to auscultation and percussion with normal breathing effort HEART: regular rate & rhythm and no murmurs with mild bilateral lower extremity edema ABDOMEN:abdomen soft, non-tender and normal bowel sounds Musculoskeletal:no cyanosis of digits and no clubbing  NEURO: alert & oriented x 3 with fluent speech, no focal motor/sensory deficits  LABORATORY DATA:  I have reviewed the data as listed    Component Value Date/Time   NA 143 04/27/2014 0848   NA 142 12/14/2013 0521   K 3.9 04/27/2014 0848   K 4.3 12/14/2013 0521   CL 104 12/14/2013 0521   CO2 24 04/27/2014 0848   CO2 26 12/14/2013 0521   GLUCOSE 125 04/27/2014 0848   GLUCOSE 155* 12/14/2013 0521   BUN 7.1 04/27/2014 0848   BUN 11 12/14/2013 0521    CREATININE 0.8 04/27/2014 0848   CREATININE 0.55 12/14/2013 0521   CALCIUM 8.9 04/27/2014 0848   CALCIUM 9.1 12/14/2013 0521   PROT 7.0 04/27/2014 0848   PROT 7.5 12/13/2013 2018   ALBUMIN 3.3* 04/27/2014 0848   ALBUMIN 3.7 12/13/2013 2018   AST 30 04/27/2014 0848   AST 17 12/13/2013 2018   ALT 29 04/27/2014 0848   ALT 14 12/13/2013 2018   ALKPHOS 76 04/27/2014 0848   ALKPHOS 80 12/13/2013 2018   BILITOT 0.36 04/27/2014 0848   BILITOT 0.3 12/13/2013 2018   GFRNONAA >90 12/14/2013 0521   GFRAA >90 12/14/2013 0521    No results found for: SPEP, UPEP  Lab Results  Component Value Date   WBC 5.2 04/27/2014   NEUTROABS 3.4 04/27/2014   HGB 10.8* 04/27/2014   HCT 33.2* 04/27/2014   MCV 97.7 04/27/2014   PLT 258 04/27/2014      Chemistry      Component Value Date/Time   NA 143 04/27/2014 0848   NA 142 12/14/2013 0521   K 3.9 04/27/2014 0848   K 4.3 12/14/2013 0521   CL 104 12/14/2013 0521   CO2 24 04/27/2014 0848   CO2  26 12/14/2013 0521   BUN 7.1 04/27/2014 0848   BUN 11 12/14/2013 0521   CREATININE 0.8 04/27/2014 0848   CREATININE 0.55 12/14/2013 0521      Component Value Date/Time   CALCIUM 8.9 04/27/2014 0848   CALCIUM 9.1 12/14/2013 0521   ALKPHOS 76 04/27/2014 0848   ALKPHOS 80 12/13/2013 2018   AST 30 04/27/2014 0848   AST 17 12/13/2013 2018   ALT 29 04/27/2014 0848   ALT 14 12/13/2013 2018   BILITOT 0.36 04/27/2014 0848   BILITOT 0.3 12/13/2013 2018      ASSESSMENT & PLAN:  Adenocarcinoma of upper lobe of right lung Overall, she tolerated treatment well. Continue with that dose adjustment.   Anemia in neoplastic disease This is likely anemia of chronic disease. The patient denies recent history of bleeding such as epistaxis, hematuria or hematochezia. She is asymptomatic from the anemia. We will observe for now.     Esophagitis I recommend a trial of Zantac at nighttime. I recommend she refrain from taking ibuprofen as this could exacerbate  esophagitis.    Bilateral leg edema This is related to fluid retention from excessive ingestion of NSAID. I recommend elastic compression hose.   Arthritis She has been taking NSAID for chronic arthritis pain. She is intolerant to pain medicine. I recommend a trial of Tylenol with Codeine that might also help with her cough. She agreed to proceed.   Cellulitis She was treated with antibiotics recently for cellulitis. There is a very mild faint rash in her lower extremity but overall I felt that it is probably improving. I recommend observation only.   Sinusitis, chronic She has signs and symptoms of chronic sinusitis/congestion likely induced by seasonal change/allergy. Clinically, she does not appear to be infected. She has completed a course of antibiotics recently. I recommend over-the-counter decongestants. The Tylenol with Codeine that I am prescribing for her pain should also help with the cough. I reassured the patient.   Essential hypertension She has significant high blood pressure today. The patient attributed that to stress. She is also taking Allegra NSAID that could also cause hypertension. I recommend the patient to stop taking NSAID. I will monitor her blood pressure closely in the next visit.   Other constipation The patient has chronic constipation. The Tylenol with Codeine could potentially exacerbate that. I recommend she continue to use laxatives as needed to regulate her bowel habits.    No orders of the defined types were placed in this encounter.   All questions were answered. The patient knows to call the clinic with any problems, questions or concerns. No barriers to learning was detected. I spent 25 minutes counseling the patient face to face. The total time spent in the appointment was 30 minutes and more than 50% was on counseling and review of test results     [facility], [place], MD 04/27/2014 9:46 AM

## 2014-04-29 ENCOUNTER — Other Ambulatory Visit: Payer: No Typology Code available for payment source

## 2014-04-29 ENCOUNTER — Ambulatory Visit: Payer: No Typology Code available for payment source

## 2014-05-06 ENCOUNTER — Encounter: Payer: Self-pay | Admitting: *Deleted

## 2014-05-06 ENCOUNTER — Ambulatory Visit: Payer: No Typology Code available for payment source

## 2014-05-06 ENCOUNTER — Ambulatory Visit (HOSPITAL_BASED_OUTPATIENT_CLINIC_OR_DEPARTMENT_OTHER): Payer: No Typology Code available for payment source

## 2014-05-06 ENCOUNTER — Other Ambulatory Visit (HOSPITAL_BASED_OUTPATIENT_CLINIC_OR_DEPARTMENT_OTHER): Payer: No Typology Code available for payment source

## 2014-05-06 DIAGNOSIS — C3411 Malignant neoplasm of upper lobe, right bronchus or lung: Secondary | ICD-10-CM | POA: Diagnosis not present

## 2014-05-06 DIAGNOSIS — Z5111 Encounter for antineoplastic chemotherapy: Secondary | ICD-10-CM | POA: Diagnosis not present

## 2014-05-06 DIAGNOSIS — Z95828 Presence of other vascular implants and grafts: Secondary | ICD-10-CM

## 2014-05-06 LAB — COMPREHENSIVE METABOLIC PANEL (CC13)
ALT: 19 U/L (ref 0–55)
ANION GAP: 11 meq/L (ref 3–11)
AST: 30 U/L (ref 5–34)
Albumin: 3.3 g/dL — ABNORMAL LOW (ref 3.5–5.0)
Alkaline Phosphatase: 81 U/L (ref 40–150)
BUN: 10.5 mg/dL (ref 7.0–26.0)
CO2: 26 meq/L (ref 22–29)
CREATININE: 0.7 mg/dL (ref 0.6–1.1)
Calcium: 9 mg/dL (ref 8.4–10.4)
Chloride: 108 mEq/L (ref 98–109)
EGFR: >90 mL/min/{1.73_m2} (ref >90)
Glucose: 124 mg/dl (ref 70–140)
Potassium: 3.6 mEq/L (ref 3.5–5.1)
Sodium: 145 mEq/L (ref 136–145)
Total Bilirubin: 0.32 mg/dL (ref 0.20–1.20)
Total Protein: 6.9 g/dL (ref 6.4–8.3)

## 2014-05-06 LAB — CBC WITH DIFFERENTIAL/PLATELET
BASO%: 0.4 % (ref 0.0–2.0)
BASOS ABS: 0 10*3/uL (ref 0.0–0.1)
EOS ABS: 0.2 10*3/uL (ref 0.0–0.5)
EOS%: 4.6 % (ref 0.0–7.0)
HEMATOCRIT: 34.4 % — AB (ref 34.8–46.6)
HEMOGLOBIN: 11.1 g/dL — AB (ref 11.6–15.9)
LYMPH%: 20.9 % (ref 14.0–49.7)
MCH: 31.6 pg (ref 25.1–34.0)
MCHC: 32.3 g/dL (ref 31.5–36.0)
MCV: 98 fL (ref 79.5–101.0)
MONO#: 0.7 10*3/uL (ref 0.1–0.9)
MONO%: 14.2 % — AB (ref 0.0–14.0)
NEUT#: 2.8 10*3/uL (ref 1.5–6.5)
NEUT%: 59.9 % (ref 38.4–76.8)
PLATELETS: 151 10*3/uL (ref 145–400)
RBC: 3.51 10*6/uL — ABNORMAL LOW (ref 3.70–5.45)
RDW: 13.8 % (ref 11.2–14.5)
WBC: 4.6 10*3/uL (ref 3.9–10.3)
lymph#: 1 10*3/uL (ref 0.9–3.3)
nRBC: 0 % (ref 0–0)

## 2014-05-06 MED ORDER — DEXAMETHASONE SODIUM PHOSPHATE 100 MG/10ML IJ SOLN
Freq: Once | INTRAMUSCULAR | Status: AC
  Administered 2014-05-06: 10:00:00 via INTRAVENOUS
  Filled 2014-05-06: qty 4

## 2014-05-06 MED ORDER — SODIUM CHLORIDE 0.9 % IJ SOLN
10.0000 mL | INTRAMUSCULAR | Status: DC | PRN
  Administered 2014-05-06: 10 mL
  Filled 2014-05-06: qty 10

## 2014-05-06 MED ORDER — CYANOCOBALAMIN 1000 MCG/ML IJ SOLN
INTRAMUSCULAR | Status: AC
  Filled 2014-05-06: qty 1

## 2014-05-06 MED ORDER — SODIUM CHLORIDE 0.9 % IV SOLN
480.0000 mg/m2 | Freq: Once | INTRAVENOUS | Status: AC
  Administered 2014-05-06: 1100 mg via INTRAVENOUS
  Filled 2014-05-06: qty 44

## 2014-05-06 MED ORDER — HEPARIN SOD (PORK) LOCK FLUSH 100 UNIT/ML IV SOLN
500.0000 [IU] | Freq: Once | INTRAVENOUS | Status: AC | PRN
  Administered 2014-05-06: 500 [IU]
  Filled 2014-05-06: qty 5

## 2014-05-06 MED ORDER — SODIUM CHLORIDE 0.9 % IJ SOLN
10.0000 mL | INTRAMUSCULAR | Status: DC | PRN
  Administered 2014-05-06: 10 mL via INTRAVENOUS
  Filled 2014-05-06: qty 10

## 2014-05-06 MED ORDER — SODIUM CHLORIDE 0.9 % IV SOLN
Freq: Once | INTRAVENOUS | Status: AC
  Administered 2014-05-06: 10:00:00 via INTRAVENOUS

## 2014-05-06 MED ORDER — CYANOCOBALAMIN 1000 MCG/ML IJ SOLN
1000.0000 ug | Freq: Once | INTRAMUSCULAR | Status: AC
Start: 2014-05-06 — End: 2014-05-06
  Administered 2014-05-06: 1000 ug via INTRAMUSCULAR

## 2014-05-06 MED ORDER — LORAZEPAM 2 MG/ML IJ SOLN: 1.0000 mg | Freq: Once | INTRAMUSCULAR | Status: DC

## 2014-05-06 NOTE — Patient Instructions (Signed)

## 2014-05-06 NOTE — Patient Instructions (Signed)
[facility] Discharge Instructions for Patients Receiving Chemotherapy  Today you received the following chemotherapy agents alimta  To help prevent nausea and vomiting after your treatment, we encourage you to take your nausea medication as directed   If you develop nausea and vomiting that is not controlled by your nausea medication, call the clinic.   BELOW ARE SYMPTOMS THAT SHOULD BE REPORTED IMMEDIATELY:  *FEVER GREATER THAN 100.5 F  *CHILLS WITH OR WITHOUT FEVER  NAUSEA AND VOMITING THAT IS NOT CONTROLLED WITH YOUR NAUSEA MEDICATION  *UNUSUAL SHORTNESS OF BREATH  *UNUSUAL BRUISING OR BLEEDING  TENDERNESS IN MOUTH AND THROAT WITH OR WITHOUT PRESENCE OF ULCERS  *URINARY PROBLEMS  *BOWEL PROBLEMS  UNUSUAL RASH Items with * indicate a potential emergency and should be followed up as soon as possible.  Feel free to call the clinic you have any questions or concerns. The clinic phone number is [phone].

## 2014-05-10 ENCOUNTER — Telehealth: Payer: Self-pay | Admitting: *Deleted

## 2014-05-10 ENCOUNTER — Other Ambulatory Visit: Payer: Self-pay | Admitting: Hematology and Oncology

## 2014-05-10 DIAGNOSIS — C3411 Malignant neoplasm of upper lobe, right bronchus or lung: Secondary | ICD-10-CM

## 2014-05-10 MED ORDER — LEVOTHYROXINE SODIUM 137 MCG PO TABS: 137.0000 ug | ORAL_TABLET | Freq: Every day | ORAL | Status: DC

## 2014-05-10 MED ORDER — LEVOTHYROXINE SODIUM 125 MCG PO TABS: 125.0000 ug | ORAL_TABLET | Freq: Every day | ORAL | Status: DC

## 2014-05-10 NOTE — Telephone Encounter (Signed)
Pt clarifies she does take 125 mcg with 137 mcg daily "for many years."  Informed pt Dr. Alvy Bimler will refill x one month and she will recheck her thyroid levels w/ labs on 4/28.  Pt verbalized understanding.  30 day supply of both doses escribed to [facility].

## 2014-05-10 NOTE — Telephone Encounter (Signed)
Pt left VM needs refill on Levothyroxine 125 mcg with 137 mcg sent to [facility].

## 2014-05-10 NOTE — Telephone Encounter (Signed)
Can you clarify is it 125 or 137?

## 2014-05-18 ENCOUNTER — Ambulatory Visit: Payer: No Typology Code available for payment source

## 2014-05-18 ENCOUNTER — Ambulatory Visit: Payer: No Typology Code available for payment source | Admitting: Hematology and Oncology

## 2014-05-18 ENCOUNTER — Other Ambulatory Visit: Payer: No Typology Code available for payment source

## 2014-05-20 ENCOUNTER — Ambulatory Visit: Payer: No Typology Code available for payment source

## 2014-05-20 ENCOUNTER — Other Ambulatory Visit: Payer: No Typology Code available for payment source

## 2014-05-20 ENCOUNTER — Ambulatory Visit: Payer: No Typology Code available for payment source | Admitting: Hematology and Oncology

## 2014-05-24 ENCOUNTER — Ambulatory Visit: Payer: No Typology Code available for payment source | Admitting: Hematology and Oncology

## 2014-05-24 ENCOUNTER — Telehealth: Payer: Self-pay | Admitting: *Deleted

## 2014-05-24 NOTE — Telephone Encounter (Signed)
CALLED PT. AND LEFT A VOICE MAIL CONCERNING A VISIT WITH [place] TOMORROW AT 10:15AM. ASKED PT. TO RETURN A CALL TO THE OFFICE IN THE MORNING.

## 2014-05-24 NOTE — Telephone Encounter (Signed)
Can you call to see if she wants to come in for eval At around 1015 am I can see

## 2014-05-24 NOTE — Telephone Encounter (Signed)
PT. RETURNED CALL. THE SWELLING IS GOING UP THE FRONT AND SIDES OF PT.'S LEGS. HER ANKLES AND LEGS ARE PAINFUL TO THE TOUCH. THERE IS STILL SOME REDNESS. PT. HAS NOT GOTTEN THE COMPRESSION HOSE. SHE WILL GO TOMORROW AFTER WORK. PT. IS HAVING SHORTNESS OF BREATH WITH EXERTION. PT. ASKED IF DR.GORSUCH NEEDS TO SEE HER TOMORROW? THIS NOTE ROUTED TO [place] AND TAMMI HOLLAND,RN.

## 2014-05-24 NOTE — Telephone Encounter (Signed)
PT. STATES ON VOICE MAIL SHE IS SHORT OF BREATH. RETURN PT.'S CALL AND LEFT A VOICE MAIL TO RETURN A CALL TO TRIAGE.

## 2014-05-25 ENCOUNTER — Telehealth: Payer: Self-pay | Admitting: *Deleted

## 2014-05-25 NOTE — Telephone Encounter (Signed)
Left message to call back. Dr Alvy Bimler can see her today @ 10:15

## 2014-05-27 ENCOUNTER — Ambulatory Visit (HOSPITAL_BASED_OUTPATIENT_CLINIC_OR_DEPARTMENT_OTHER): Payer: No Typology Code available for payment source | Admitting: Hematology and Oncology

## 2014-05-27 ENCOUNTER — Telehealth: Payer: Self-pay | Admitting: Hematology and Oncology

## 2014-05-27 ENCOUNTER — Encounter: Payer: Self-pay | Admitting: Hematology and Oncology

## 2014-05-27 ENCOUNTER — Ambulatory Visit: Payer: No Typology Code available for payment source

## 2014-05-27 ENCOUNTER — Other Ambulatory Visit (HOSPITAL_BASED_OUTPATIENT_CLINIC_OR_DEPARTMENT_OTHER): Payer: No Typology Code available for payment source

## 2014-05-27 VITALS — BP 199/73 | HR 62 | Temp 98.1°F | Resp 20 | Ht 74.0 in | Wt 229.3 lb

## 2014-05-27 DIAGNOSIS — R600 Localized edema: Secondary | ICD-10-CM | POA: Diagnosis not present

## 2014-05-27 DIAGNOSIS — IMO0001 Reserved for inherently not codable concepts without codable children: Secondary | ICD-10-CM

## 2014-05-27 DIAGNOSIS — C7951 Secondary malignant neoplasm of bone: Secondary | ICD-10-CM

## 2014-05-27 DIAGNOSIS — G4700 Insomnia, unspecified: Secondary | ICD-10-CM

## 2014-05-27 DIAGNOSIS — C3411 Malignant neoplasm of upper lobe, right bronchus or lung: Secondary | ICD-10-CM

## 2014-05-27 DIAGNOSIS — D630 Anemia in neoplastic disease: Secondary | ICD-10-CM | POA: Diagnosis not present

## 2014-05-27 DIAGNOSIS — I10 Essential (primary) hypertension: Secondary | ICD-10-CM

## 2014-05-27 DIAGNOSIS — R5383 Other fatigue: Secondary | ICD-10-CM

## 2014-05-27 DIAGNOSIS — R030 Elevated blood-pressure reading, without diagnosis of hypertension: Secondary | ICD-10-CM

## 2014-05-27 DIAGNOSIS — Z95828 Presence of other vascular implants and grafts: Secondary | ICD-10-CM

## 2014-05-27 DIAGNOSIS — M1990 Unspecified osteoarthritis, unspecified site: Secondary | ICD-10-CM

## 2014-05-27 LAB — COMPREHENSIVE METABOLIC PANEL (CC13)
ALK PHOS: 94 U/L (ref 40–150)
ALT: 28 U/L (ref 0–55)
AST: 30 U/L (ref 5–34)
Albumin: 3.3 g/dL — ABNORMAL LOW (ref 3.5–5.0)
Anion Gap: 12 mEq/L — ABNORMAL HIGH (ref 3–11)
BILIRUBIN TOTAL: 0.25 mg/dL (ref 0.20–1.20)
BUN: 10 mg/dL (ref 7.0–26.0)
CO2: 27 meq/L (ref 22–29)
Calcium: 9.1 mg/dL (ref 8.4–10.4)
Chloride: 105 mEq/L (ref 98–109)
Creatinine: 0.8 mg/dL (ref 0.6–1.1)
EGFR: 76 mL/min/{1.73_m2} — ABNORMAL LOW (ref >90)
Glucose: 112 mg/dl (ref 70–140)
Potassium: 3.4 mEq/L — ABNORMAL LOW (ref 3.5–5.1)
SODIUM: 144 meq/L (ref 136–145)
TOTAL PROTEIN: 7.4 g/dL (ref 6.4–8.3)

## 2014-05-27 LAB — CBC WITH DIFFERENTIAL/PLATELET
BASO%: 0.8 % (ref 0.0–2.0)
Basophils Absolute: 0 10*3/uL (ref 0.0–0.1)
EOS%: 5 % (ref 0.0–7.0)
Eosinophils Absolute: 0.3 10*3/uL (ref 0.0–0.5)
HCT: 33.9 % — ABNORMAL LOW (ref 34.8–46.6)
HGB: 11 g/dL — ABNORMAL LOW (ref 11.6–15.9)
LYMPH#: 0.9 10*3/uL (ref 0.9–3.3)
LYMPH%: 13.6 % — ABNORMAL LOW (ref 14.0–49.7)
MCH: 30.9 pg (ref 25.1–34.0)
MCHC: 32.5 g/dL (ref 31.5–36.0)
MCV: 95 fL (ref 79.5–101.0)
MONO#: 0.9 10*3/uL (ref 0.1–0.9)
MONO%: 14.2 % — ABNORMAL HIGH (ref 0.0–14.0)
NEUT#: 4.2 10*3/uL (ref 1.5–6.5)
NEUT%: 66.4 % (ref 38.4–76.8)
Platelets: 314 10*3/uL (ref 145–400)
RBC: 3.57 10*6/uL — ABNORMAL LOW (ref 3.70–5.45)
RDW: 14.2 % (ref 11.2–14.5)
WBC: 6.4 10*3/uL (ref 3.9–10.3)

## 2014-05-27 LAB — TSH CHCC: TSH: 0.242 m[IU]/L — AB (ref 0.308–3.960)

## 2014-05-27 LAB — T4, FREE: Free T4: 1.67 ng/dL (ref 0.80–1.80)

## 2014-05-27 MED ORDER — LORAZEPAM 0.5 MG PO TABS: 0.5000 mg | ORAL_TABLET | Freq: Three times a day (TID) | ORAL | Status: DC | PRN

## 2014-05-27 MED ORDER — HYDROCHLOROTHIAZIDE 25 MG PO TABS: 25.0000 mg | ORAL_TABLET | Freq: Every day | ORAL | Status: DC

## 2014-05-27 MED ORDER — TRAMADOL HCL 50 MG PO TABS: 50.0000 mg | ORAL_TABLET | Freq: Four times a day (QID) | ORAL | Status: DC | PRN

## 2014-05-27 MED ORDER — SODIUM CHLORIDE 0.9 % IJ SOLN
10.0000 mL | INTRAMUSCULAR | Status: DC | PRN
  Administered 2014-05-27: 10 mL via INTRAVENOUS
  Filled 2014-05-27: qty 10

## 2014-05-27 MED ORDER — CLONIDINE HCL 0.1 MG PO TABS
0.2000 mg | ORAL_TABLET | Freq: Once | ORAL | Status: AC
  Administered 2014-05-27: 0.2 mg via ORAL

## 2014-05-27 MED ORDER — HEPARIN SOD (PORK) LOCK FLUSH 100 UNIT/ML IV SOLN
500.0000 [IU] | Freq: Once | INTRAVENOUS | Status: AC
  Administered 2014-05-27: 500 [IU] via INTRAVENOUS
  Filled 2014-05-27: qty 5

## 2014-05-27 NOTE — Assessment & Plan Note (Signed)
She has been taking NSAID for chronic arthritis pain. She is intolerant to pain medicine. I recommend a trial of Tylenol with Codeine but she developed significant nausea. I recommend a trial of tramadol

## 2014-05-27 NOTE — Progress Notes (Signed)
Pt to come back tomorrow for treatment and requested PAC to be left accessed. Sorbaview dressing and biopatch placed. Instructions given to pt for being left accessed and going home. Pt verbalized all understanding and will call with any questions or concerns.

## 2014-05-27 NOTE — Telephone Encounter (Signed)
Pt confirmed labs/ov per 04/27 POF, gave AVS and Calendar..... KJ

## 2014-05-27 NOTE — Progress Notes (Signed)
Pt in for [facility] access for labs. Pt states she is supposed to have chemo today but did not see it on her schedule. Pt has no appt made for chemo but has tx orders in for todays date. Called and informed Tammi, RN with Dr. Alvy Bimler. Informed pt this nurse would leave her accessed and if she does not get chemo today then she needs her port de-accessed. Informed pt to inform her nurse of port access if not getting tx. Pt verbalized understanding, Placed instructions in pt instructions and printed.

## 2014-05-27 NOTE — Assessment & Plan Note (Signed)
Overall, she tolerated treatment well from a few minor side effects including profound fatigue. I will proceed with 10% reduction of dose to see if she would tolerate that better. I plan to repeat imaging study with PET CT scan and MRI before see her back, prior to next cycle of treatment.

## 2014-05-27 NOTE — Assessment & Plan Note (Signed)
I will start her on clonidine today and hydrochlorothiazide and recheck her blood pressure in the next visit.

## 2014-05-27 NOTE — Assessment & Plan Note (Signed)
This is due to fluid retention and uncontrolled hypertension. I will start her on hydrochlorothiazide.

## 2014-05-27 NOTE — Progress Notes (Signed)
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   12/13/2013 Imaging CT scan show large mass in the right lung apex likely representing primary lung carcinoma or large metastasis. Metastases demonstrated in the pretracheal lymph nodes, T1 vertebral body and possibly sacrum.   12/14/2013 Initial Diagnosis Adenocarcinoma of upper lobe of right lung   12/14/2013 Pathology Results Accession: [id number] biopsy from right upper lobe showed adenocarcinoma. [facility] testing was positive for several mutations without any known treatment options now   12/17/2013 Imaging MRI of the head is negative.   12/23/2013 - 02/05/2014 Radiation Therapy She received radiation therapy to the mediastinal mass and bone   12/28/2013 Imaging PET/CT scan showed mediastinal mass, lymphadenopathy and T1 involvement.   01/01/2014 - 01/29/2014 Chemotherapy Weekly carboplatin and Taxol were added   03/12/2014 Imaging Repeat PET scan showed near complete response to treatment.   03/16/2014 -  Chemotherapy She started treatment with Alimta for maintenance    INTERVAL HISTORY: Please see below for problem oriented charting. She returns for further follow-up. She complained of profound fatigue with chemotherapy. She also has diffuse leg edema despite stopping NSAID week ago. She could not tolerate codeine as it caused significant nausea. She still has significant  arthritis pain. Blood pressure has been running high. She denies recent infection.  REVIEW OF SYSTEMS:   Constitutional: Denies fevers, chills or abnormal weight loss Eyes: Denies blurriness of vision Ears, nose, mouth, throat, and face: Denies mucositis or sore throat Respiratory: Denies cough, dyspnea or wheezes Cardiovascular: Denies palpitation, chest discomfort  Skin: Denies abnormal skin rashes Lymphatics: Denies new lymphadenopathy or easy bruising Neurological:Denies numbness, tingling or new weaknesses Behavioral/Psych: Mood is stable, no new changes  All other systems were reviewed with the patient and are negative.  I have reviewed the past medical history, past surgical history, social history and family history with the patient and they are unchanged from previous note.  ALLERGIES:  is allergic to codeine and penicillins.  MEDICATIONS:  Current Outpatient Prescriptions  Medication Sig Dispense Refill  . dexamethasone (DECADRON) 4 MG tablet take 2 tabs in the morning for 2 days every 3 weeks after chemo 30 tablet 1  . folic acid (FOLVITE) 1 MG tablet Take 1 tablet (1 mg total) by mouth daily. Start 5-7 days before Alimta chemotherapy. Continue until 21 days after Alimta completed. 100 tablet 3  . ibuprofen (ADVIL,MOTRIN) 200 MG tablet Take 800 mg by mouth every 6 (six) hours as needed (For pain.).    Marland Kitchen levothyroxine (SYNTHROID, LEVOTHROID) 125 MCG tablet Take 1 tablet (125 mcg total) by mouth daily. With 137 mcg daily 30 tablet 0  . levothyroxine (SYNTHROID, LEVOTHROID) 137 MCG tablet Take 1 tablet (137 mcg total) by mouth daily. With 125 mcg daily 30 tablet 0  . lidocaine-prilocaine (EMLA) cream Apply to affected area once 30 g 3  . mirtazapine (REMERON) 15 MG tablet  Take 1 tablet (15 mg total) by mouth at bedtime. (Patient taking differently: Take 15 mg by mouth at bedtime as needed (For sleep.). ) 30 tablet 3  . Acetaminophen-Codeine 300-30 MG per tablet Take 1 tablet by  mouth every 4 (four) hours as needed for pain. (Patient not taking: Reported on 05/27/2014) 30 tablet 0  . hydrochlorothiazide (HYDRODIURIL) 25 MG tablet Take 1 tablet (25 mg total) by mouth daily. 30 tablet 1  . LORazepam (ATIVAN) 0.5 MG tablet Take 1 tablet (0.5 mg total) by mouth every 8 (eight) hours as needed for anxiety (or nausea). 10 tablet 0  . ondansetron (ZOFRAN) 4 MG tablet Take 1 tablet (4 mg total) by mouth every 6 (six) hours as needed for nausea. (Patient not taking: Reported on 05/27/2014) 40 tablet 0  . ranitidine (ZANTAC) 150 MG capsule Take 1 capsule (150 mg total) by mouth every evening. (Patient not taking: Reported on 05/27/2014) 30 capsule 6  . temazepam (RESTORIL) 15 MG capsule Take 1 capsule (15 mg total) by mouth at bedtime as needed for sleep. (Patient not taking: Reported on 05/27/2014) 30 capsule 0  . traMADol (ULTRAM) 50 MG tablet Take 1 tablet (50 mg total) by mouth every 6 (six) hours as needed. 30 tablet 0   No current facility-administered medications for this visit.    PHYSICAL EXAMINATION: ECOG PERFORMANCE STATUS: 1 - Symptomatic but completely ambulatory  Filed Vitals:   05/27/14 1020  BP: 199/73  Pulse: 62  Temp: 98.1 F (36.7 C)  Resp: 20   Filed Weights   05/27/14 1020  Weight: 229 lb 4.8 oz (104.01 kg)    GENERAL:alert, no distress and comfortable. She is mildly cushingoid SKIN: skin color, texture, turgor are normal, no rashes or significant lesions EYES: normal, Conjunctiva are pink and non-injected, sclera clear OROPHARYNX:no exudate, no erythema and lips, buccal mucosa, and tongue normal  NECK: supple, thyroid normal size, non-tender, without nodularity LYMPH:  no palpable lymphadenopathy in the cervical, axillary or inguinal LUNGS: clear to auscultation and percussion with normal breathing effort HEART: regular rate & rhythm and no murmurs with moderate bilateral lower extremity edema ABDOMEN:abdomen soft, non-tender and normal bowel  sounds Musculoskeletal:no cyanosis of digits and no clubbing  NEURO: alert & oriented x 3 with fluent speech, no focal motor/sensory deficits  LABORATORY DATA:  I have reviewed the data as listed    Component Value Date/Time   NA 144 05/27/2014 0952   NA 142 12/14/2013 0521   K 3.4* 05/27/2014 0952   K 4.3 12/14/2013 0521   CL 104 12/14/2013 0521   CO2 27 05/27/2014 0952   CO2 26 12/14/2013 0521   GLUCOSE 112 05/27/2014 0952   GLUCOSE 155* 12/14/2013 0521   BUN 10.0 05/27/2014 0952   BUN 11 12/14/2013 0521   CREATININE 0.8 05/27/2014 0952   CREATININE 0.55 12/14/2013 0521   CALCIUM 9.1 05/27/2014 0952   CALCIUM 9.1 12/14/2013 0521   PROT 7.4 05/27/2014 0952   PROT 7.5 12/13/2013 2018   ALBUMIN 3.3* 05/27/2014 0952   ALBUMIN 3.7 12/13/2013 2018   AST 30 05/27/2014 0952   AST 17 12/13/2013 2018   ALT 28 05/27/2014 0952   ALT 14 12/13/2013 2018   ALKPHOS 94 05/27/2014 0952   ALKPHOS 80 12/13/2013 2018   BILITOT 0.25 05/27/2014 0952   BILITOT 0.3 12/13/2013 2018   GFRNONAA >90 12/14/2013 0521   GFRAA >90 12/14/2013 0521    No results found for: SPEP, UPEP  Lab Results  Component  Value Date   WBC 6.4 05/27/2014   NEUTROABS 4.2 05/27/2014   HGB 11.0* 05/27/2014   HCT 33.9* 05/27/2014   MCV 95.0 05/27/2014   PLT 314 05/27/2014      Chemistry      Component Value Date/Time   NA 144 05/27/2014 0952   NA 142 12/14/2013 0521   K 3.4* 05/27/2014 0952   K 4.3 12/14/2013 0521   CL 104 12/14/2013 0521   CO2 27 05/27/2014 0952   CO2 26 12/14/2013 0521   BUN 10.0 05/27/2014 0952   BUN 11 12/14/2013 0521   CREATININE 0.8 05/27/2014 0952   CREATININE 0.55 12/14/2013 0521      Component Value Date/Time   CALCIUM 9.1 05/27/2014 0952   CALCIUM 9.1 12/14/2013 0521   ALKPHOS 94 05/27/2014 0952   ALKPHOS 80 12/13/2013 2018   AST 30 05/27/2014 0952   AST 17 12/13/2013 2018   ALT 28 05/27/2014 0952   ALT 14 12/13/2013 2018   BILITOT 0.25 05/27/2014 0952   BILITOT 0.3  12/13/2013 2018     ASSESSMENT & PLAN:  Adenocarcinoma of upper lobe of right lung Overall, she tolerated treatment well from a few minor side effects including profound fatigue. I will proceed with 10% reduction of dose to see if she would tolerate that better. I plan to repeat imaging study with PET CT scan and MRI before see her back, prior to next cycle of treatment.   Anemia in neoplastic disease This is likely anemia of chronic disease. The patient denies recent history of bleeding such as epistaxis, hematuria or hematochezia. She is asymptomatic from the anemia. We will observe for now.      Arthritis She has been taking NSAID for chronic arthritis pain. She is intolerant to pain medicine. I recommend a trial of Tylenol with Codeine but she developed significant nausea. I recommend a trial of tramadol     Bilateral leg edema This is due to fluid retention and uncontrolled hypertension. I will start her on hydrochlorothiazide.   Essential hypertension I will start her on clonidine today and hydrochlorothiazide and recheck her blood pressure in the next visit.   Other fatigue She has profound fatigue. Serum TSH level is actually very low, indicating of overtreatment with thyroid medicine. I recommend she reduce her thyroid supplement to 137 g daily. I plan to recheck it in 2 months.    Orders Placed This Encounter  Procedures  . NM PET Image Restag (PS) Skull Base To Thigh    Standing Status: Future     Number of Occurrences:      Standing Expiration Date: 07/27/2015    Order Specific Question:  Reason for Exam (SYMPTOM  OR DIAGNOSIS REQUIRED)    Answer:  staging metatstaic lung ca    Order Specific Question:  Is the patient pregnant?    Answer:  No    Order Specific Question:  Preferred imaging location?    Answer:  [facility]  . 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    Answer:  [facility]    Order Specific Question:  Does the patient have a pacemaker or implanted devices?  Answer:  No    Order Specific Question:  What is the patient's sedation requirement?    Answer:  No Sedation  . TSH    Standing Status: Future     Number of Occurrences:      Standing Expiration Date: 07/01/2015   All questions were answered. The patient knows to call the clinic with any problems, questions or concerns. No barriers to learning was detected. I spent 30 minutes counseling the patient face to face. The total time spent in the appointment was 40 minutes and more than 50% was on counseling and review of test results     [facility], [place], MD 05/27/2014 2:07 PM

## 2014-05-27 NOTE — Assessment & Plan Note (Signed)
She has profound fatigue. Serum TSH level is actually very low, indicating of overtreatment with thyroid medicine. I recommend she reduce her thyroid supplement to 137 g daily. I plan to recheck it in 2 months.

## 2014-05-27 NOTE — Assessment & Plan Note (Signed)
This is likely anemia of chronic disease. The patient denies recent history of bleeding such as epistaxis, hematuria or hematochezia. She is asymptomatic from the anemia. We will observe for now.  

## 2014-05-27 NOTE — Patient Instructions (Addendum)
Please make sure to have port de-accessed before leaving [facility] today unless otherwise advised.      Implanted [facility] Guide An implanted port is a type of central line that is placed under the skin. Central lines are used to provide IV access when treatment or nutrition needs to be given through a person's veins. Implanted ports are used for long-term IV access. An implanted port may be placed because:   You need IV medicine that would be irritating to the small veins in your hands or arms.   You need long-term IV medicines, such as antibiotics.   You need IV nutrition for a long period.   You need frequent blood draws for lab tests.   You need dialysis.  Implanted ports are usually placed in the chest area, but they can also be placed in the upper arm, the abdomen, or the leg. An implanted port has two main parts:   Reservoir. The reservoir is round and will appear as a small, raised area under your skin. The reservoir is the part where a needle is inserted to give medicines or draw blood.   Catheter. The catheter is a thin, flexible tube that extends from the reservoir. The catheter is placed into a large vein. Medicine that is inserted into the reservoir goes into the catheter and then into the vein.  HOW WILL I CARE FOR MY INCISION SITE? Do not get the incision site wet. Bathe or shower as directed by your health care provider.  HOW IS MY PORT ACCESSED? Special steps must be taken to access the port:   Before the port is accessed, a numbing cream can be placed on the skin. This helps numb the skin over the port site.   Your health care provider uses a sterile technique to access the port.  Your health care provider must put on a mask and sterile gloves.  The skin over your port is cleaned carefully with an antiseptic and allowed to dry.  The port is gently pinched between sterile gloves, and a needle is inserted into the port.  Only "non-coring"  port needles should be used to access the port. Once the port is accessed, a blood return should be checked. This helps ensure that the port is in the vein and is not clogged.   If your port needs to remain accessed for a constant infusion, a clear (transparent) bandage will be placed over the needle site. The bandage and needle will need to be changed every week, or as directed by your health care provider.   Keep the bandage covering the needle clean and dry. Do not get it wet. Follow your health care provider's instructions on how to take a shower or bath while the port is accessed.   If your port does not need to stay accessed, no bandage is needed over the port.  WHAT IS FLUSHING? Flushing helps keep the port from getting clogged. Follow your health care provider's instructions on how and when to flush the port. Ports are usually flushed with saline solution or a medicine called heparin. The need for flushing will depend on how the port is used.   If the port is used for intermittent medicines or blood draws, the port will need to be flushed:   After medicines have been given.   After blood has been drawn.   As part of routine maintenance.   If a constant infusion is running, the port may not need to  be flushed.  HOW LONG WILL MY PORT STAY IMPLANTED? The port can stay in for as long as your health care provider thinks it is needed. When it is time for the port to come out, surgery will be done to remove it. The procedure is similar to the one performed when the port was put in.  WHEN SHOULD I SEEK IMMEDIATE MEDICAL CARE? When you have an implanted port, you should seek immediate medical care if:   You notice a bad smell coming from the incision site.   You have swelling, redness, or drainage at the incision site.   You have more swelling or pain at the port site or the surrounding area.   You have a fever that is not controlled with medicine. Document Released:  01/15/2005 Document Revised: 11/05/2012 Document Reviewed: 09/22/2012 [facility] [employer] Patient Information 2015 [place], [place]. This information is not intended to replace advice given to you by your health care provider. Make sure you discuss any questions you have with your health care provider.

## 2014-05-28 ENCOUNTER — Ambulatory Visit (HOSPITAL_BASED_OUTPATIENT_CLINIC_OR_DEPARTMENT_OTHER): Payer: No Typology Code available for payment source

## 2014-05-28 VITALS — HR 65 | Temp 98.1°F | Resp 20

## 2014-05-28 DIAGNOSIS — C3411 Malignant neoplasm of upper lobe, right bronchus or lung: Secondary | ICD-10-CM | POA: Diagnosis not present

## 2014-05-28 DIAGNOSIS — C7951 Secondary malignant neoplasm of bone: Secondary | ICD-10-CM | POA: Diagnosis not present

## 2014-05-28 DIAGNOSIS — Z5111 Encounter for antineoplastic chemotherapy: Secondary | ICD-10-CM | POA: Diagnosis not present

## 2014-05-28 MED ORDER — SODIUM CHLORIDE 0.9 % IV SOLN
Freq: Once | INTRAVENOUS | Status: AC
  Administered 2014-05-28: 08:00:00 via INTRAVENOUS

## 2014-05-28 MED ORDER — SODIUM CHLORIDE 0.9 % IV SOLN
Freq: Once | INTRAVENOUS | Status: AC
  Administered 2014-05-28: 08:00:00 via INTRAVENOUS
  Filled 2014-05-28: qty 4

## 2014-05-28 MED ORDER — HEPARIN SOD (PORK) LOCK FLUSH 100 UNIT/ML IV SOLN
500.0000 [IU] | Freq: Once | INTRAVENOUS | Status: AC | PRN
  Administered 2014-05-28: 500 [IU]
  Filled 2014-05-28: qty 5

## 2014-05-28 MED ORDER — SODIUM CHLORIDE 0.9 % IJ SOLN
10.0000 mL | INTRAMUSCULAR | Status: DC | PRN
  Administered 2014-05-28: 10 mL
  Filled 2014-05-28: qty 10

## 2014-05-28 MED ORDER — SODIUM CHLORIDE 0.9 % IV SOLN
430.0000 mg/m2 | Freq: Once | INTRAVENOUS | Status: AC
  Administered 2014-05-28: 1000 mg via INTRAVENOUS
  Filled 2014-05-28: qty 40

## 2014-05-28 MED ORDER — LORAZEPAM 2 MG/ML IJ SOLN: 1.0000 mg | Freq: Once | INTRAMUSCULAR | Status: DC

## 2014-05-28 NOTE — Patient Instructions (Signed)
[facility] Discharge Instructions for Patients Receiving Chemotherapy  Today you received the following chemotherapy agents Alimta.  To help prevent nausea and vomiting after your treatment, we encourage you to take your nausea medication as prescribed by your physician. If you develop nausea and vomiting that is not controlled by your nausea medication, call the clinic.   BELOW ARE SYMPTOMS THAT SHOULD BE REPORTED IMMEDIATELY:  *FEVER GREATER THAN 100.5 F  *CHILLS WITH OR WITHOUT FEVER  NAUSEA AND VOMITING THAT IS NOT CONTROLLED WITH YOUR NAUSEA MEDICATION  *UNUSUAL SHORTNESS OF BREATH  *UNUSUAL BRUISING OR BLEEDING  TENDERNESS IN MOUTH AND THROAT WITH OR WITHOUT PRESENCE OF ULCERS  *URINARY PROBLEMS  *BOWEL PROBLEMS  UNUSUAL RASH Items with * indicate a potential emergency and should be followed up as soon as possible.  Feel free to call the clinic you have any questions or concerns. The clinic phone number is [phone].  Please show the CHEMO ALERT CARD at check-in to the Emergency Department and triage nurse.   

## 2014-05-31 ENCOUNTER — Telehealth: Payer: Self-pay

## 2014-05-31 ENCOUNTER — Telehealth: Payer: Self-pay | Admitting: *Deleted

## 2014-05-31 MED ORDER — PROCHLORPERAZINE MALEATE 10 MG PO TABS: 10.0000 mg | ORAL_TABLET | Freq: Four times a day (QID) | ORAL | Status: DC | PRN

## 2014-05-31 NOTE — Telephone Encounter (Signed)
1) Get her BP check. Severe headache could be a sign of uncontrolled high BP 2) Caffeine will help: soda, tea, coffee 3) Use compazine/phenergan for nausea instead of zofran 4) She may take tramadol with tylenol for headache every 4 hours

## 2014-05-31 NOTE — Telephone Encounter (Signed)
Instructed pt on Dr. Calton Dach reply below.   Instructed pt to hold zofran as it can cause headaches and to try new rx for compazine for nausea.  Rx sent to [facility].   Also instructed to check her BP and make sure she is taking her BP med.  May need to take compazine first and wait 30 minutes to take other medications since she is having difficulty keeping meds down.  Ok to try tylenol for headache.  May try some caffeine as well.  Pt verbalized understanding.  Asked her to call us back if nausea or headache remain unrelieved.  She verbalized understanding.

## 2014-05-31 NOTE — Telephone Encounter (Signed)
PT. TOOK THE ONDANSETRON BEFORE THE TRAMADOL FOR ARTHRITIC PAIN  BUT "THREW UP". SHE ALSO STARTED THE HCTZ ON Friday. PT. TRIED A 1/2 OF THE TRAMADOL AND STILL "THREW UP". SHE HAS HAD A HEADACHE SINCE Friday. MAY PT. TAKE TYLENOL FOR HER HEADACHE? IS THERE ANYTHING ELSE TO TRY FOR PAIN?

## 2014-05-31 NOTE — Telephone Encounter (Signed)
Pt called stating she got a BP machine and her pressure is 174/90. S/w Dr Alvy Bimler and called pt back. Instructed per Dr Alvy Bimler to take 1/2 HCTZ in pm in addition to her 1 HCTZ in the am. Keep a log of BP and call us back on Friday with measurements.

## 2014-06-02 ENCOUNTER — Telehealth: Payer: Self-pay | Admitting: *Deleted

## 2014-06-02 NOTE — Telephone Encounter (Signed)
Pt called and stated she still has problem with nausea but no vomiting.  Pt took Compazine with relief but makes pt drowsy.  Pt checked BP -  134/94 this am, and takes HCTZ as instructed by md.  Pt wanted to know what else to do for nause.  Pt also stated she has headache. Instructed pt that she does not drive after taking Compazine due to drowsiness.  Pt can take Ativan 0.'5mg'$  in an hour - can take sublingually.   Instructed pt to call office back early if Ativan did not help with nausea symptom. Pt stated she is able to keep small amount of food and po fluids down without difficulty. Pt understood that she might need to come in to see Jenny Reichmann, NP symptom management if pt continues to have nausea not relieved by Compazine nor Ativan.

## 2014-06-02 NOTE — Telephone Encounter (Signed)
She can also split the compazine and only take 1/2 tab next time

## 2014-06-04 ENCOUNTER — Other Ambulatory Visit: Payer: Self-pay | Admitting: Hematology and Oncology

## 2014-06-04 ENCOUNTER — Telehealth: Payer: Self-pay | Admitting: *Deleted

## 2014-06-04 ENCOUNTER — Telehealth: Payer: Self-pay | Admitting: Hematology and Oncology

## 2014-06-04 DIAGNOSIS — G4700 Insomnia, unspecified: Secondary | ICD-10-CM

## 2014-06-04 MED ORDER — MIRTAZAPINE 15 MG PO TABS: 15.0000 mg | ORAL_TABLET | Freq: Every evening | ORAL | Status: DC | PRN

## 2014-06-04 NOTE — Telephone Encounter (Signed)
The fatigue could be due to recent thyroid medication adjustment Please increase HCTZ to 1 tab BID for high BP If not better, I can see her next week. Call on Wed morning pls

## 2014-06-04 NOTE — Telephone Encounter (Signed)
Added appt per pof...per orders pof pt aware °

## 2014-06-04 NOTE — Telephone Encounter (Signed)
Called pt back regarding c/o high BP and "overwhelming fatigue."  Instructed pt per Dr. Alvy Bimler; "The fatigue could be due to recent thyroid medication adjustment. Please increase HCTZ to 1 tab BID for high BP.  If not better, I can see her next week. Call on Wed morning pls"   Pt c/o panic attacks, tearful, crying and emotional.  She is taking ativan for the anxiety.  The "overwhelming fatigue" is her main concern and she does want to see Dr. Alvy Bimler as soon as possible.  She also requests refill on mirtazapine.  States quit taking temazepam because it makes her too drowsy.  Dr. Alvy Bimler says ok to refill mirtazapine and she can see pt on Monday 1 pm for lab and 1;30 pm for MD visit.  Instructed pt on mirtazapine refilled.  Reinstructed to take HCTZ one whole pill twice daily.  Come on Monday at 1pm for lab/MD.   Pt verbalized understanding.

## 2014-06-04 NOTE — Telephone Encounter (Signed)
Informed patient to increase blood pressure medication bid and to call next week if the fatigue is the same. If fatigue is the same MD Alvy Bimler would like to set an office visit appointment. Patient verbalized understanding

## 2014-06-04 NOTE — Telephone Encounter (Signed)
Patient wanted to notify MD about blood pressure. 8am:144/100 (left arm) 1:13 pm 134/94. Patient would like MD to discuss this chemotherapy treatment with her due to the overwhelming fatigue. Patient would prefer and office visit or a phone call. Message sent to MD and RN.

## 2014-06-07 ENCOUNTER — Telehealth: Payer: Self-pay | Admitting: *Deleted

## 2014-06-07 ENCOUNTER — Ambulatory Visit (HOSPITAL_BASED_OUTPATIENT_CLINIC_OR_DEPARTMENT_OTHER): Payer: No Typology Code available for payment source | Admitting: Hematology and Oncology

## 2014-06-07 ENCOUNTER — Other Ambulatory Visit (HOSPITAL_BASED_OUTPATIENT_CLINIC_OR_DEPARTMENT_OTHER): Payer: No Typology Code available for payment source

## 2014-06-07 ENCOUNTER — Other Ambulatory Visit: Payer: Self-pay | Admitting: Hematology and Oncology

## 2014-06-07 ENCOUNTER — Encounter: Payer: Self-pay | Admitting: Hematology and Oncology

## 2014-06-07 VITALS — BP 189/86 | HR 91 | Temp 98.0°F | Resp 20 | Ht 74.0 in | Wt 219.8 lb

## 2014-06-07 DIAGNOSIS — G4700 Insomnia, unspecified: Secondary | ICD-10-CM | POA: Diagnosis not present

## 2014-06-07 DIAGNOSIS — C3411 Malignant neoplasm of upper lobe, right bronchus or lung: Secondary | ICD-10-CM

## 2014-06-07 DIAGNOSIS — F411 Generalized anxiety disorder: Secondary | ICD-10-CM

## 2014-06-07 DIAGNOSIS — N19 Unspecified kidney failure: Secondary | ICD-10-CM

## 2014-06-07 DIAGNOSIS — I10 Essential (primary) hypertension: Secondary | ICD-10-CM | POA: Diagnosis not present

## 2014-06-07 LAB — COMPREHENSIVE METABOLIC PANEL (CC13)
ALT: 38 U/L (ref 0–55)
ANION GAP: 18 meq/L — AB (ref 3–11)
AST: 32 U/L (ref 5–34)
Albumin: 3.6 g/dL (ref 3.5–5.0)
Alkaline Phosphatase: 99 U/L (ref 40–150)
BILIRUBIN TOTAL: 0.32 mg/dL (ref 0.20–1.20)
BUN: 27.5 mg/dL — ABNORMAL HIGH (ref 7.0–26.0)
CO2: 26 meq/L (ref 22–29)
CREATININE: 1.7 mg/dL — AB (ref 0.6–1.1)
Calcium: 9.6 mg/dL (ref 8.4–10.4)
Chloride: 96 mEq/L — ABNORMAL LOW (ref 98–109)
EGFR: 33 mL/min/{1.73_m2} — ABNORMAL LOW (ref >90)
Glucose: 233 mg/dl — ABNORMAL HIGH (ref 70–140)
Potassium: 3.4 mEq/L — ABNORMAL LOW (ref 3.5–5.1)
Sodium: 140 mEq/L (ref 136–145)
Total Protein: 8.5 g/dL — ABNORMAL HIGH (ref 6.4–8.3)

## 2014-06-07 LAB — CBC WITH DIFFERENTIAL/PLATELET
BASO%: 0 % (ref 0.0–2.0)
Basophils Absolute: 0 10*3/uL (ref 0.0–0.1)
EOS%: 3.4 % (ref 0.0–7.0)
Eosinophils Absolute: 0.1 10*3/uL (ref 0.0–0.5)
HCT: 39.7 % (ref 34.8–46.6)
HEMOGLOBIN: 13.2 g/dL (ref 11.6–15.9)
LYMPH#: 1.1 10*3/uL (ref 0.9–3.3)
LYMPH%: 36 % (ref 14.0–49.7)
MCH: 31.2 pg (ref 25.1–34.0)
MCHC: 33.2 g/dL (ref 31.5–36.0)
MCV: 93.9 fL (ref 79.5–101.0)
MONO#: 0.8 10*3/uL (ref 0.1–0.9)
MONO%: 26.9 % — ABNORMAL HIGH (ref 0.0–14.0)
NEUT#: 1 10*3/uL — ABNORMAL LOW (ref 1.5–6.5)
NEUT%: 33.7 % — ABNORMAL LOW (ref 38.4–76.8)
Platelets: 123 10*3/uL — ABNORMAL LOW (ref 145–400)
RBC: 4.23 10*6/uL (ref 3.70–5.45)
RDW: 13.1 % (ref 11.2–14.5)
WBC: 3 10*3/uL — ABNORMAL LOW (ref 3.9–10.3)

## 2014-06-07 LAB — T4, FREE: FREE T4: 1.47 ng/dL (ref 0.80–1.80)

## 2014-06-07 MED ORDER — AMLODIPINE BESYLATE 10 MG PO TABS: 10.0000 mg | ORAL_TABLET | Freq: Every day | ORAL | Status: DC

## 2014-06-07 MED ORDER — LORAZEPAM 0.5 MG PO TABS: 0.5000 mg | ORAL_TABLET | Freq: Three times a day (TID) | ORAL | Status: DC | PRN

## 2014-06-07 NOTE — Assessment & Plan Note (Signed)
Her leg swelling has resolved but she is developing prerenal failure.  I recommend holding off Hydrochlorothiazide switch her to amlodipine. I encouraged her to increase oral fluid intake.

## 2014-06-07 NOTE — Assessment & Plan Note (Signed)
She tolerated treatment poorly due to multiple issues. I will proceed with staging PET/CT scan and MRI next week.

## 2014-06-07 NOTE — Telephone Encounter (Signed)
-----   Message from [name], MD sent at 06/07/2014  3:18 PM EDT ----- Regarding: labs Patient is dehydrated. She needs to drink more water 2 liter per day. If not, we can schedule IVF Hold HCTZ Start amlodipine: call in to local pharmacy 10 mg daily PO, 30 tabs no refills ----- Message -----    From: [facility] Interface    Sent: 06/07/2014   2:13 PM      To: [name], MD

## 2014-06-07 NOTE — Assessment & Plan Note (Signed)
This is significant. Likely exacerbated by recent reduction of her thyroid medicine. I plan to increase her thyroid medicine back and recommend regular mirtazapine along with lorazepam as needed for panic attacks. I also give her a letter to come off work until the symptoms resolve

## 2014-06-07 NOTE — Assessment & Plan Note (Signed)
She is no longer taking temazepam. She will continue taking Remeron.

## 2014-06-07 NOTE — Telephone Encounter (Signed)
Left message to call Dr Alvy Bimler' nurse. Pt

## 2014-06-07 NOTE — Assessment & Plan Note (Signed)
Her blood pressure responded well to hydrochlorothiazide but that has caused prerenal failure. I recommend holding off hydrochlorothiazide and start her on amlodipine

## 2014-06-07 NOTE — Progress Notes (Signed)
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   12/13/2013 Imaging CT scan show large mass in the right lung apex likely representing primary lung carcinoma or large metastasis. Metastases demonstrated in the pretracheal lymph nodes, T1 vertebral body and possibly sacrum.   12/14/2013 Initial Diagnosis Adenocarcinoma of upper lobe of right lung   12/14/2013 Pathology Results Accession: [id number] biopsy from right upper lobe showed adenocarcinoma. [facility] testing was positive for several mutations without any known treatment options now   12/17/2013 Imaging MRI of the head is negative.   12/23/2013 - 02/05/2014 Radiation Therapy She received radiation therapy to the mediastinal mass and bone   12/28/2013 Imaging PET/CT scan showed mediastinal mass, lymphadenopathy and T1 involvement.   01/01/2014 - 01/29/2014 Chemotherapy Weekly carboplatin and Taxol were added   03/12/2014 Imaging Repeat PET scan showed near complete response to treatment.   03/16/2014 -  Chemotherapy She started treatment with Alimta for maintenance    INTERVAL HISTORY: Please see below for problem oriented charting.  she is seen urgently today because of significant symptoms of anxiety, profound fatigue and not feeling well. This coincided with her treatment last week and after I cut her dose of thyroid medicine by half  She complained of depression and a  lot of stress at home. She is not sleeping well. She is not taking mirtazapine regularly. She denies leg swelling. She denies recent headaches  REVIEW OF SYSTEMS:   Constitutional: Denies fevers, chills or abnormal weight loss Eyes: Denies blurriness of vision Ears, nose, mouth, throat, and face: Denies mucositis or sore throat Respiratory: Denies cough, dyspnea or wheezes Cardiovascular: Denies palpitation, chest discomfort or lower extremity swelling Gastrointestinal:  Denies nausea, heartburn or change in bowel habits Skin: Denies abnormal skin rashes Lymphatics: Denies new lymphadenopathy or easy bruising All other systems were reviewed with the patient and are negative.  I have reviewed the past medical history, past surgical history, social history and family history with the patient and they are unchanged from previous note.  ALLERGIES:  is allergic to codeine and penicillins.  MEDICATIONS:  Current Outpatient Prescriptions  Medication Sig Dispense Refill  . dexamethasone (DECADRON) 4 MG tablet take 2 tabs in the morning for 2 days every 3 weeks after chemo 30 tablet 1  . folic acid (FOLVITE) 1 MG tablet Take 1 tablet (1 mg total) by mouth daily. Start 5-7 days before Alimta chemotherapy. Continue until 21 days after Alimta completed. 100 tablet 3  . hydrochlorothiazide (HYDRODIURIL) 25 MG tablet Take 1 tablet (25 mg total) by mouth daily. 30 tablet 1  . levothyroxine (SYNTHROID, LEVOTHROID) 137 MCG tablet Take 1 tablet (137 mcg total) by mouth daily. With 125 mcg daily 30 tablet 0  . LORazepam (ATIVAN) 0.5 MG tablet Take 1 tablet (0.5 mg total) by mouth every 8 (eight) hours as needed for anxiety (or nausea). 30 tablet 0  .  mirtazapine (REMERON) 15 MG tablet Take 1 tablet (15 mg total) by mouth at bedtime as needed (For sleep.). 30 tablet 3  . prochlorperazine (COMPAZINE) 10 MG tablet Take 1 tablet (10 mg total) by mouth every 6 (six) hours as needed for nausea or vomiting. 30  tablet 1  . amLODipine (NORVASC) 10 MG tablet Take 1 tablet (10 mg total) by mouth daily. 30 tablet 0  . levothyroxine (SYNTHROID, LEVOTHROID) 125 MCG tablet Take 1 tablet (125 mcg total) by mouth daily. With 137 mcg daily (Patient not taking: Reported on 06/07/2014) 30 tablet 0  . lidocaine-prilocaine (EMLA) cream Apply to affected area once (Patient not taking: Reported on 06/07/2014) 30 g 3  . ondansetron (ZOFRAN) 4 MG tablet Take 1 tablet (4 mg total) by mouth every 6 (six) hours as needed for nausea. (Patient not taking: Reported on 05/27/2014) 40 tablet 0  . ranitidine (ZANTAC) 150 MG capsule Take 1 capsule (150 mg total) by mouth every evening. (Patient not taking: Reported on 05/27/2014) 30 capsule 6  . temazepam (RESTORIL) 15 MG capsule Take 1 capsule (15 mg total) by mouth at bedtime as needed for sleep. (Patient not taking: Reported on 05/27/2014) 30 capsule 0  . traMADol (ULTRAM) 50 MG tablet Take 1 tablet (50 mg total) by mouth every 6 (six) hours as needed. (Patient not taking: Reported on 06/07/2014) 30 tablet 0   No current facility-administered medications for this visit.    PHYSICAL EXAMINATION: ECOG PERFORMANCE STATUS: 1 - Symptomatic but completely ambulatory  Filed Vitals:   06/07/14 1419  BP: 189/86  Pulse: 91  Temp: 98 F (36.7 C)  Resp: 20   Filed Weights   06/07/14 1419  Weight: 219 lb 12.8 oz (99.701 kg)    GENERAL:alert, no distress and comfortable. She is tearful SKIN: skin color, texture, turgor are normal, no rashes or significant lesions EYES: normal, Conjunctiva are pink and non-injected, sclera clear Musculoskeletal:no cyanosis of digits and no clubbing  NEURO: alert & oriented x 3 with fluent speech, no focal motor/sensory deficits  LABORATORY DATA:  I have reviewed the data as listed    Component Value Date/Time   NA 140 06/07/2014 1359   NA 142 12/14/2013 0521   K 3.4* 06/07/2014 1359   K 4.3 12/14/2013 0521   CL 104 12/14/2013 0521   CO2 26  06/07/2014 1359   CO2 26 12/14/2013 0521   GLUCOSE 233* 06/07/2014 1359   GLUCOSE 155* 12/14/2013 0521   BUN 27.5* 06/07/2014 1359   BUN 11 12/14/2013 0521   CREATININE 1.7* 06/07/2014 1359   CREATININE 0.55 12/14/2013 0521   CALCIUM 9.6 06/07/2014 1359   CALCIUM 9.1 12/14/2013 0521   PROT 8.5* 06/07/2014 1359   PROT 7.5 12/13/2013 2018   ALBUMIN 3.6 06/07/2014 1359   ALBUMIN 3.7 12/13/2013 2018   AST 32 06/07/2014 1359   AST 17 12/13/2013 2018   ALT 38 06/07/2014 1359   ALT 14 12/13/2013 2018   ALKPHOS 99 06/07/2014 1359   ALKPHOS 80 12/13/2013 2018   BILITOT 0.32 06/07/2014 1359   BILITOT 0.3 12/13/2013 2018   GFRNONAA >90 12/14/2013 0521   GFRAA >90 12/14/2013 0521    No results found for: SPEP, UPEP  Lab Results  Component Value Date   WBC 3.0* 06/07/2014   NEUTROABS 1.0* 06/07/2014   HGB 13.2 06/07/2014   HCT 39.7 06/07/2014   MCV 93.9 06/07/2014   PLT 123* 06/07/2014      Chemistry  Component Value Date/Time   NA 140 06/07/2014 1359   NA 142 12/14/2013 0521   K 3.4* 06/07/2014 1359   K 4.3 12/14/2013 0521   CL 104 12/14/2013 0521   CO2 26 06/07/2014 1359   CO2 26 12/14/2013 0521   BUN 27.5* 06/07/2014 1359   BUN 11 12/14/2013 0521   CREATININE 1.7* 06/07/2014 1359   CREATININE 0.55 12/14/2013 0521      Component Value Date/Time   CALCIUM 9.6 06/07/2014 1359   CALCIUM 9.1 12/14/2013 0521   ALKPHOS 99 06/07/2014 1359   ALKPHOS 80 12/13/2013 2018   AST 32 06/07/2014 1359   AST 17 12/13/2013 2018   ALT 38 06/07/2014 1359   ALT 14 12/13/2013 2018   BILITOT 0.32 06/07/2014 1359   BILITOT 0.3 12/13/2013 2018       ASSESSMENT & PLAN:  Adenocarcinoma of upper lobe of right lung She tolerated treatment poorly due to multiple issues. I will proceed with staging PET/CT scan and MRI next week.   Generalized anxiety disorder  This is significant. Likely exacerbated by recent reduction of her thyroid medicine. I plan to increase her thyroid  medicine back and recommend regular mirtazapine along with lorazepam as needed for panic attacks. I also give her a letter to come off work until the symptoms resolve   Insomnia She is no longer taking temazepam. She will continue taking Remeron.     Essential hypertension  Her blood pressure responded well to hydrochlorothiazide but that has caused prerenal failure. I recommend holding off hydrochlorothiazide and start her on amlodipine   Prerenal renal failure  Her leg swelling has resolved but she is developing prerenal failure.  I recommend holding off Hydrochlorothiazide switch her to amlodipine. I encouraged her to increase oral fluid intake.    No orders of the defined types were placed in this encounter.   All questions were answered. The patient knows to call the clinic with any problems, questions or concerns. No barriers to learning was detected. I spent 25 minutes counseling the patient face to face. The total time spent in the appointment was 30 minutes and more than 50% was on counseling and review of test results     [facility], [place], MD 06/07/2014 4:26 PM

## 2014-06-08 ENCOUNTER — Ambulatory Visit: Payer: No Typology Code available for payment source

## 2014-06-08 ENCOUNTER — Other Ambulatory Visit: Payer: No Typology Code available for payment source

## 2014-06-10 ENCOUNTER — Other Ambulatory Visit: Payer: No Typology Code available for payment source

## 2014-06-10 ENCOUNTER — Ambulatory Visit: Payer: No Typology Code available for payment source

## 2014-06-10 ENCOUNTER — Encounter: Payer: Self-pay | Admitting: Radiation Oncology

## 2014-06-15 ENCOUNTER — Emergency Department (HOSPITAL_COMMUNITY)
Admission: EM | Admit: 2014-06-15 | Discharge: 2014-06-16 | Disposition: A | Discharge status: Home / Self Care | Payer: No Typology Code available for payment source | Attending: Emergency Medicine | Admitting: Emergency Medicine

## 2014-06-15 ENCOUNTER — Emergency Department (HOSPITAL_COMMUNITY): Payer: No Typology Code available for payment source

## 2014-06-15 ENCOUNTER — Other Ambulatory Visit: Payer: Self-pay | Admitting: Hematology and Oncology

## 2014-06-15 ENCOUNTER — Encounter (HOSPITAL_COMMUNITY): Payer: Self-pay | Admitting: Emergency Medicine

## 2014-06-15 ENCOUNTER — Telehealth: Payer: Self-pay | Admitting: *Deleted

## 2014-06-15 DIAGNOSIS — Z87891 Personal history of nicotine dependence: Secondary | ICD-10-CM | POA: Insufficient documentation

## 2014-06-15 DIAGNOSIS — Z8669 Personal history of other diseases of the nervous system and sense organs: Secondary | ICD-10-CM | POA: Diagnosis not present

## 2014-06-15 DIAGNOSIS — Z79899 Other long term (current) drug therapy: Secondary | ICD-10-CM | POA: Diagnosis not present

## 2014-06-15 DIAGNOSIS — I10 Essential (primary) hypertension: Secondary | ICD-10-CM | POA: Diagnosis not present

## 2014-06-15 DIAGNOSIS — Z85118 Personal history of other malignant neoplasm of bronchus and lung: Secondary | ICD-10-CM | POA: Diagnosis not present

## 2014-06-15 DIAGNOSIS — Z85850 Personal history of malignant neoplasm of thyroid: Secondary | ICD-10-CM | POA: Diagnosis not present

## 2014-06-15 DIAGNOSIS — F410 Panic disorder [episodic paroxysmal anxiety] without agoraphobia: Secondary | ICD-10-CM | POA: Diagnosis not present

## 2014-06-15 DIAGNOSIS — Z880 Allergy status to penicillin: Secondary | ICD-10-CM | POA: Diagnosis not present

## 2014-06-15 DIAGNOSIS — M62838 Other muscle spasm: Secondary | ICD-10-CM | POA: Insufficient documentation

## 2014-06-15 DIAGNOSIS — M542 Cervicalgia: Secondary | ICD-10-CM | POA: Diagnosis present

## 2014-06-15 LAB — CBC WITH DIFFERENTIAL/PLATELET
BASOS PCT: 0 % (ref 0–1)
Basophils Absolute: 0 10*3/uL (ref 0.0–0.1)
EOS ABS: 0.2 10*3/uL (ref 0.0–0.7)
Eosinophils Relative: 2 % (ref 0–5)
HEMATOCRIT: 38.1 % (ref 36.0–46.0)
HEMOGLOBIN: 12.5 g/dL (ref 12.0–15.0)
Lymphocytes Relative: 9 % — ABNORMAL LOW (ref 12–46)
Lymphs Abs: 1 10*3/uL (ref 0.7–4.0)
MCH: 31.7 pg (ref 26.0–34.0)
MCHC: 32.8 g/dL (ref 30.0–36.0)
MCV: 96.7 fL (ref 78.0–100.0)
Monocytes Absolute: 1.2 10*3/uL — ABNORMAL HIGH (ref 0.1–1.0)
Monocytes Relative: 10 % (ref 3–12)
NEUTROS PCT: 79 % — AB (ref 43–77)
Neutro Abs: 9.5 10*3/uL — ABNORMAL HIGH (ref 1.7–7.7)
Platelets: 382 10*3/uL (ref 150–400)
RBC: 3.94 MIL/uL (ref 3.87–5.11)
RDW: 13.8 % (ref 11.5–15.5)
WBC: 11.9 10*3/uL — ABNORMAL HIGH (ref 4.0–10.5)

## 2014-06-15 MED ORDER — ONDANSETRON HCL 4 MG/2ML IJ SOLN
4.0000 mg | Freq: Once | INTRAMUSCULAR | Status: AC
  Administered 2014-06-15: 4 mg via INTRAVENOUS
  Filled 2014-06-15: qty 2

## 2014-06-15 MED ORDER — HYDROMORPHONE HCL 1 MG/ML IJ SOLN
1.0000 mg | Freq: Once | INTRAMUSCULAR | Status: AC
  Administered 2014-06-15: 1 mg via INTRAVENOUS
  Filled 2014-06-15: qty 1

## 2014-06-15 MED ORDER — DIAZEPAM 5 MG PO TABS
5.0000 mg | ORAL_TABLET | Freq: Once | ORAL | Status: AC
  Administered 2014-06-15: 5 mg via ORAL
  Filled 2014-06-15: qty 1

## 2014-06-15 NOTE — ED Notes (Signed)
Pt states she has lung CA and has had neck pain throughout the process as part of the mass connected to her vertebrae. Pt states that mass has been diminished by radiation/chemo but the pain remains. Alert and oriented.

## 2014-06-15 NOTE — ED Notes (Signed)
Transported to xray 

## 2014-06-15 NOTE — ED Provider Notes (Signed)
CSN: [id number]     Arrival date & time 06/15/14  2021 History   First MD Initiated Contact with Patient 06/15/14 2230     Chief Complaint  Patient presents with  . Neck Pain      HPI Patient reports increasing right neck and right trapezius discomfort and pain over the past 2 weeks.  She's tried muscle relaxants and anti-inflammatories without improvement in her symptoms.  She has a history of lung cancer currently undergoing treatment.  She reports that her lung cancer is located in the right upper lobe.  She is oriented radiation to this area.  She denies weakness or numbness of her right upper extremity.  No swelling of her right upper extremity.  She reports the pain is focused in her right trapezius muscle and states she feels like that area and region is swollen.  No new rash.  Her pain is moderate to severe in severity.  Her pain is worsened by palpation of her right scapular and trapezius region.  She feels like this may have begun shortly after doing some heavy lifting.   Past Medical History  Diagnosis Date  . Panic attack   . Hx of thyroid cancer     10 years ago  . Insomnia 12/29/2013  . S/P radiation therapy completed 12/16/13-02/05/14    RULL lung  . 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 Arthritis 04/27/2014  . Essential hypertension 04/27/2014   Past Surgical History  Procedure Laterality Date  . Thyroidectomy     History reviewed. No pertinent family history. History  Substance Use Topics  . Smoking status: Former Research scientist (life sciences)  . Smokeless tobacco: Never Used  . Alcohol Use: Yes   OB History    No data available     Review of Systems  All other systems reviewed and are negative.     Allergies  Codeine and Penicillins  Home Medications   Prior to Admission medications   Medication Sig Start Date End Date Taking? Authorizing Provider  amLODipine (NORVASC) 10 MG tablet Take 1 tablet (10 mg total) by mouth  daily. 06/07/14  Yes [name], MD  folic acid (FOLVITE) 1 MG tablet Take 1 tablet (1 mg total) by mouth daily. Start 5-7 days before Alimta chemotherapy. Continue until 21 days after Alimta completed. 02/05/14  Yes [name], MD  levothyroxine (SYNTHROID, LEVOTHROID) 125 MCG tablet Take 1 tablet (125 mcg total) by mouth daily. With 137 mcg daily Patient taking differently: Take 62.5 mcg by mouth daily. With 137 mcg daily 05/10/14  Yes [name], MD  levothyroxine (SYNTHROID, LEVOTHROID) 137 MCG tablet Take 1 tablet (137 mcg total) by mouth daily. With 125 mcg daily 05/10/14  Yes [name], MD  lidocaine-prilocaine (EMLA) cream Apply to affected area once Patient taking differently: Apply 1 application topically daily as needed (prior to port accessed). Apply to affected area once 02/05/14  Yes [name], MD  LORazepam (ATIVAN) 0.5 MG tablet Take 1 tablet (0.5 mg total) by mouth every 8 (eight) hours as needed for anxiety (or nausea). 06/07/14  Yes [name], MD  mirtazapine (REMERON) 15 MG tablet Take 1 tablet (15 mg total) by mouth at bedtime as needed (For sleep.). Patient taking differently: Take 15 mg by mouth at bedtime.  06/04/14  Yes [name], MD  ondansetron (ZOFRAN) 4 MG tablet Take 1 tablet (4 mg total) by mouth every 6 (six) hours as needed for nausea.  12/17/13  Yes Annita Brod, MD  prochlorperazine (COMPAZINE) 10 MG tablet Take 1 tablet (10 mg total) by mouth every 6 (six) hours as needed for nausea or vomiting. 05/31/14  Yes [name], MD  ranitidine (ZANTAC) 150 MG capsule Take 1 capsule (150 mg total) by mouth every evening. Patient taking differently: Take 150 mg by mouth daily as needed for heartburn.  04/06/14  Yes [name], MD  dexamethasone (DECADRON) 4 MG tablet take 2 tabs in the morning for 2 days every 3 weeks after chemo Patient not taking: Reported on 06/15/2014 04/12/14   [name], MD  hydrochlorothiazide (HYDRODIURIL) 25 MG tablet Take 1 tablet (25 mg total) by mouth  daily. Patient not taking: Reported on 06/15/2014 05/27/14   [name], MD  temazepam (RESTORIL) 15 MG capsule Take 1 capsule (15 mg total) by mouth at bedtime as needed for sleep. Patient not taking: Reported on 05/27/2014 02/05/14   [name], MD  traMADol (ULTRAM) 50 MG tablet Take 1 tablet (50 mg total) by mouth every 6 (six) hours as needed. Patient not taking: Reported on 06/07/2014 05/27/14   [name], MD   BP 166/95 mmHg  Pulse 74  Temp(Src) 98.1 F (36.7 C) (Oral)  Resp 20  SpO2 98% Physical Exam  Constitutional: She is oriented to person, place, and time. She appears well-developed and well-nourished. No distress.  HENT:  Head: Normocephalic and atraumatic.  Eyes: EOM are normal.  Neck: Normal range of motion.  Cardiovascular: Normal rate, regular rhythm and normal heart sounds.   Pulmonary/Chest: Effort normal and breath sounds normal.  Abdominal: Soft. She exhibits no distension. There is no tenderness.  Musculoskeletal: Normal range of motion.  Spasm of the right trapezius muscle.  Normal right radial pulse.  Normal strength in the right hand.  No rash coming across to right scapular trapezius region.  No supraclavicular nodes noted on the right side  Neurological: She is alert and oriented to person, place, and time.  Skin: Skin is warm and dry.  Psychiatric: She has a normal mood and affect. Judgment normal.  Nursing note and vitals reviewed.   ED Course  Procedures (including critical care time) Labs Review Labs Reviewed  CBC WITH DIFFERENTIAL/PLATELET - Abnormal; Notable for the following:    WBC 11.9 (*)    Neutrophils Relative % 79 (*)    Neutro Abs 9.5 (*)    Lymphocytes Relative 9 (*)    Monocytes Absolute 1.2 (*)    All other components within normal limits  BASIC METABOLIC PANEL - Abnormal; Notable for the following:    Potassium 3.3 (*)    Glucose, Bld 134 (*)    Creatinine, Ser 1.42 (*)    GFR calc non Af Amer 39 (*)    GFR calc Af Amer 45 (*)     All other components within normal limits    Imaging Review Dg Chest 2 View  06/15/2014   CLINICAL DATA:  Acute onset of right shoulder and neck pain. Patient with current history of lung cancer. Initial encounter.  EXAM: CHEST  2 VIEW  COMPARISON:  Chest radiograph performed 12/14/2013, and CT of the chest performed 12/13/2013  FINDINGS: The lungs are well-aerated. The previously noted right upper lobe lung mass is significantly decreased in size, with underlying right apical scarring again seen. Mild left basilar opacity likely reflects atelectasis. No definite pleural effusion or pneumothorax is identified.  The heart is normal in size; the mediastinal contour is within normal limits.  No acute osseous abnormalities are seen. A right-sided chest port is noted ending about the distal SVC.  IMPRESSION: 1. Right upper lobe lung mass is significantly decreased in size, with underlying right apical scarring again noted. 2. Mild left basilar airspace opacity likely reflects atelectasis. 3. No acute osseous abnormalities seen.   Electronically Signed   By: Garald Balding M.D.   On: 06/15/2014 23:42     EKG Interpretation None      MDM   Final diagnoses:  Muscle spasm  Neck pain on right side   12:28 AM Patient feels much better after pain medication.  I suspect this is trapezius muscle spasm.  Patient has muscle relaxants at home.  I'll add Percocet.  Discharge home in good condition.  Chest x-ray demonstrates no significant worsening of her right upper lobe mass.    Jola Schmidt, MD 06/16/14 [phone]

## 2014-06-15 NOTE — Telephone Encounter (Signed)
TC from patient this am stating that she is having some muscle spasms/swelling in the right side of her neck. It responds well to heat and advil. Patient wanted to know if her PET scan, MRI would show up anything in her neck. Explained that it was possible and that Dr. Alvy Bimler would see the results later in the day. Patient has an appt with her on the 19th to review the scan results. No other needs identified.

## 2014-06-16 ENCOUNTER — Other Ambulatory Visit: Payer: No Typology Code available for payment source

## 2014-06-16 ENCOUNTER — Telehealth: Payer: Self-pay | Admitting: *Deleted

## 2014-06-16 ENCOUNTER — Ambulatory Visit (HOSPITAL_COMMUNITY): Admission: RE | Admit: 2014-06-16 | Payer: No Typology Code available for payment source | Source: Ambulatory Visit

## 2014-06-16 LAB — BASIC METABOLIC PANEL
Anion gap: 7 (ref 5–15)
BUN: 17 mg/dL (ref 6–20)
CO2: 27 mmol/L (ref 22–32)
Calcium: 9.4 mg/dL (ref 8.9–10.3)
Chloride: 104 mmol/L (ref 101–111)
Creatinine, Ser: 1.42 mg/dL — ABNORMAL HIGH (ref 0.44–1.00)
GFR, EST AFRICAN AMERICAN: 45 mL/min — AB (ref >60)
GFR, EST NON AFRICAN AMERICAN: 39 mL/min — AB (ref >60)
Glucose, Bld: 134 mg/dL — ABNORMAL HIGH (ref 65–99)
Potassium: 3.3 mmol/L — ABNORMAL LOW (ref 3.5–5.1)
Sodium: 138 mmol/L (ref 135–145)

## 2014-06-16 MED ORDER — OXYCODONE-ACETAMINOPHEN 5-325 MG PO TABS: 1.0000 | ORAL_TABLET | ORAL | Status: DC | PRN

## 2014-06-16 NOTE — Telephone Encounter (Signed)
Voicemail received that patient missed MRI and PET today after being "in [place] ER with Muscle spasms.  I couldn't have laid on the tables to have these done.  I don't know if I can receive treatment tomorrow."    Called for clarification.  Jasmine Grant "will come in at 2:00 tomorrow to see Dr. Alvy Bimler because of other problems and medications I need help or changes made.  I have swelling to my legs.  I will reschedule everything else when I come in tomorrow."

## 2014-06-17 ENCOUNTER — Telehealth: Payer: Self-pay | Admitting: Hematology and Oncology

## 2014-06-17 ENCOUNTER — Ambulatory Visit (HOSPITAL_BASED_OUTPATIENT_CLINIC_OR_DEPARTMENT_OTHER): Payer: No Typology Code available for payment source | Admitting: Hematology and Oncology

## 2014-06-17 ENCOUNTER — Other Ambulatory Visit: Payer: No Typology Code available for payment source

## 2014-06-17 ENCOUNTER — Ambulatory Visit
Admission: RE | Admit: 2014-06-17 | Discharge: 2014-06-17 | Disposition: A | Discharge status: Home / Self Care | Payer: No Typology Code available for payment source | Source: Ambulatory Visit | Attending: Radiation Oncology | Admitting: Radiation Oncology

## 2014-06-17 ENCOUNTER — Ambulatory Visit: Payer: No Typology Code available for payment source

## 2014-06-17 ENCOUNTER — Encounter: Payer: Self-pay | Admitting: Hematology and Oncology

## 2014-06-17 VITALS — BP 134/63 | HR 81 | Temp 98.5°F | Resp 18 | Ht 74.0 in | Wt 225.9 lb

## 2014-06-17 DIAGNOSIS — E038 Other specified hypothyroidism: Secondary | ICD-10-CM

## 2014-06-17 DIAGNOSIS — C3411 Malignant neoplasm of upper lobe, right bronchus or lung: Secondary | ICD-10-CM

## 2014-06-17 DIAGNOSIS — M25511 Pain in right shoulder: Secondary | ICD-10-CM | POA: Diagnosis not present

## 2014-06-17 DIAGNOSIS — D630 Anemia in neoplastic disease: Secondary | ICD-10-CM | POA: Diagnosis not present

## 2014-06-17 DIAGNOSIS — R600 Localized edema: Secondary | ICD-10-CM

## 2014-06-17 DIAGNOSIS — E039 Hypothyroidism, unspecified: Secondary | ICD-10-CM

## 2014-06-17 DIAGNOSIS — F411 Generalized anxiety disorder: Secondary | ICD-10-CM

## 2014-06-17 DIAGNOSIS — I10 Essential (primary) hypertension: Secondary | ICD-10-CM

## 2014-06-17 MED ORDER — OXYCODONE HCL ER 10 MG PO T12A: 10.0000 mg | EXTENDED_RELEASE_TABLET | Freq: Two times a day (BID) | ORAL | Status: DC

## 2014-06-17 NOTE — Assessment & Plan Note (Signed)
This is minimum. Previously, after I started her on hydrochlorothiazide, she developed acute renal failure. I recommend observation only.

## 2014-06-17 NOTE — Assessment & Plan Note (Signed)
This is likely anemia of chronic disease. The patient denies recent history of bleeding such as epistaxis, hematuria or hematochezia. She is asymptomatic from the anemia. We will observe for now.  

## 2014-06-17 NOTE — Assessment & Plan Note (Signed)
This is significant. Likely exacerbated by recent reduction of her thyroid medicine. I also give her a letter to come off work until the symptoms resolve

## 2014-06-17 NOTE — Telephone Encounter (Signed)
Gave and printed appt sched anda vs fo rpt for June...dr. Lisbeth Renshaw appt is on June 16

## 2014-06-17 NOTE — Assessment & Plan Note (Signed)
She has significant hypothyroidism with significant medication adjustment. I will recheck it again next month.

## 2014-06-17 NOTE — Assessment & Plan Note (Signed)
Her blood pressure responded well to amlodipine. Continue the same.

## 2014-06-17 NOTE — Assessment & Plan Note (Signed)
She tolerates treatment poorly for multiple different reasons. She did not show up for her imaging studies today because she ended up in the emergency department for urgent evaluation. She does not feel well enough to pursue additional therapy today. I recommend rescheduling her imaging study and chemotherapy to 2 weeks. In the meantime, I will continue supportive care.

## 2014-06-17 NOTE — Progress Notes (Signed)
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   12/13/2013 Imaging CT scan show large mass in the right lung apex likely representing primary lung carcinoma or large metastasis. Metastases demonstrated in the pretracheal lymph nodes, T1 vertebral body and possibly sacrum.   12/14/2013 Initial Diagnosis Adenocarcinoma of upper lobe of right lung   12/14/2013 Pathology Results Accession: [id number] biopsy from right upper lobe showed adenocarcinoma. [facility] testing was positive for several mutations without any known treatment options now   12/17/2013 Imaging MRI of the head is negative.   12/23/2013 - 02/05/2014 Radiation Therapy She received radiation therapy to the mediastinal mass and bone   12/28/2013 Imaging PET/CT scan showed mediastinal mass, lymphadenopathy and T1 involvement.   01/01/2014 - 01/29/2014 Chemotherapy Weekly carboplatin and Taxol were added   03/12/2014 Imaging Repeat PET scan showed near complete response to treatment.   03/16/2014 -  Chemotherapy She started treatment with Alimta for maintenance    INTERVAL HISTORY: Please see below for problem oriented charting. She returns today for further follow-up. She is not able to return back to work. She complained of severe right shoulder pain and went to the emergency department for evaluation. She missed her recent imaging study. Her pain is under control  with pain medicine. She continues to complain of profound fatigue. She complained of mild lower extremity edema.  REVIEW OF SYSTEMS:   Constitutional: Denies fevers, chills or abnormal weight loss Eyes: Denies blurriness of vision Ears, nose, mouth, throat, and face: Denies mucositis or sore throat Respiratory: Denies cough, dyspnea or wheezes Cardiovascular: Denies palpitation, chest discomfort  Gastrointestinal:  Denies nausea, heartburn or change in bowel habits Skin: Denies abnormal skin rashes Lymphatics: Denies new lymphadenopathy or easy bruising Neurological:Denies numbness, tingling or new weaknesses Behavioral/Psych: Mood is stable, no new changes  All other systems were reviewed with the patient and are negative.  I have reviewed the past medical history, past surgical history, social history and family history with the patient and they are unchanged from previous note.  ALLERGIES:  is allergic to codeine and penicillins.  MEDICATIONS:  Current Outpatient Prescriptions  Medication Sig Dispense Refill  . amLODipine (NORVASC) 10 MG tablet Take 1 tablet (10 mg total) by mouth daily. 30 tablet 0  . dexamethasone (DECADRON) 4 MG tablet take 2 tabs in the morning for 2 days every 3 weeks after chemo 30 tablet 1  . folic acid (FOLVITE) 1 MG tablet Take 1 tablet (1 mg total) by mouth daily. Start 5-7 days before Alimta chemotherapy. Continue until 21 days after Alimta completed. 100 tablet 3  . hydrochlorothiazide (HYDRODIURIL) 25 MG tablet Take 1 tablet (25 mg total) by mouth daily. 30 tablet 1  . levothyroxine (SYNTHROID, LEVOTHROID) 125 MCG tablet Take 1 tablet (125 mcg total) by mouth daily. With 137 mcg daily (Patient taking differently: Take 62.5 mcg by mouth daily. With 137 mcg  daily) 30 tablet 0  . levothyroxine (SYNTHROID, LEVOTHROID) 137 MCG tablet Take 1 tablet (137 mcg total) by mouth daily. With 125 mcg daily 30 tablet 0  . lidocaine-prilocaine (EMLA) cream Apply to  affected area once (Patient taking differently: Apply 1 application topically daily as needed (prior to port accessed). Apply to affected area once) 30 g 3  . LORazepam (ATIVAN) 0.5 MG tablet Take 1 tablet (0.5 mg total) by mouth every 8 (eight) hours as needed for anxiety (or nausea). 30 tablet 0  . methocarbamol (ROBAXIN) 500 MG tablet Take 500 mg by mouth every 8 (eight) hours as needed for muscle spasms.    . mirtazapine (REMERON) 15 MG tablet Take 1 tablet (15 mg total) by mouth at bedtime as needed (For sleep.). (Patient taking differently: Take 15 mg by mouth at bedtime. ) 30 tablet 3  . ondansetron (ZOFRAN) 4 MG tablet Take 1 tablet (4 mg total) by mouth every 6 (six) hours as needed for nausea. 40 tablet 0  . OxyCODONE (OXYCONTIN) 10 mg T12A 12 hr tablet Take 10 mg by mouth every 12 (twelve) hours.    . prochlorperazine (COMPAZINE) 10 MG tablet Take 1 tablet (10 mg total) by mouth every 6 (six) hours as needed for nausea or vomiting. 30 tablet 1  . ranitidine (ZANTAC) 150 MG capsule Take 1 capsule (150 mg total) by mouth every evening. (Patient taking differently: Take 150 mg by mouth daily as needed for heartburn. ) 30 capsule 6  . temazepam (RESTORIL) 15 MG capsule Take 1 capsule (15 mg total) by mouth at bedtime as needed for sleep. 30 capsule 0  . traMADol (ULTRAM) 50 MG tablet Take 1 tablet (50 mg total) by mouth every 6 (six) hours as needed. 30 tablet 0  . OxyCODONE (OXYCONTIN) 10 mg T12A 12 hr tablet Take 1 tablet (10 mg total) by mouth every 12 (twelve) hours. 60 tablet 0   No current facility-administered medications for this visit.    PHYSICAL EXAMINATION: ECOG PERFORMANCE STATUS: 1 - Symptomatic but completely ambulatory  Filed Vitals:   06/17/14 1401  BP: 134/63  Pulse: 81  Temp: 98.5 F (36.9 C)  Resp: 18   Filed Weights   06/17/14 1401  Weight: 225 lb 14.4 oz (102.468 kg)    GENERAL:alert, no distress and comfortable SKIN: skin color, texture, turgor are  normal, no rashes or significant lesions EYES: normal, Conjunctiva are pink and non-injected, sclera clear Musculoskeletal:no cyanosis of digits and no clubbing  NEURO: alert & oriented x 3 with fluent speech, no focal motor/sensory deficits  LABORATORY DATA:  I have reviewed the data as listed    Component Value Date/Time   NA 138 06/15/2014 2319   NA 140 06/07/2014 1359   K 3.3* 06/15/2014 2319   K 3.4* 06/07/2014 1359   CL 104 06/15/2014 2319   CO2 27 06/15/2014 2319   CO2 26 06/07/2014 1359   GLUCOSE 134* 06/15/2014 2319   GLUCOSE 233* 06/07/2014 1359   BUN 17 06/15/2014 2319   BUN 27.5* 06/07/2014 1359   CREATININE 1.42* 06/15/2014 2319   CREATININE 1.7* 06/07/2014 1359   CALCIUM 9.4 06/15/2014 2319   CALCIUM 9.6 06/07/2014 1359   PROT 8.5* 06/07/2014 1359   PROT 7.5 12/13/2013 2018   ALBUMIN 3.6 06/07/2014 1359   ALBUMIN 3.7 12/13/2013 2018   AST 32 06/07/2014 1359   AST 17 12/13/2013 2018   ALT 38 06/07/2014 1359   ALT 14 12/13/2013 2018   ALKPHOS 99  06/07/2014 1359   ALKPHOS 80 12/13/2013 2018   BILITOT 0.32 06/07/2014 1359   BILITOT 0.3 12/13/2013 2018   GFRNONAA 39* 06/15/2014 2319   GFRAA 45* 06/15/2014 2319    No results found for: SPEP, UPEP  Lab Results  Component Value Date   WBC 11.9* 06/15/2014   NEUTROABS 9.5* 06/15/2014   HGB 12.5 06/15/2014   HCT 38.1 06/15/2014   MCV 96.7 06/15/2014   PLT 382 06/15/2014      Chemistry      Component Value Date/Time   NA 138 06/15/2014 2319   NA 140 06/07/2014 1359   K 3.3* 06/15/2014 2319   K 3.4* 06/07/2014 1359   CL 104 06/15/2014 2319   CO2 27 06/15/2014 2319   CO2 26 06/07/2014 1359   BUN 17 06/15/2014 2319   BUN 27.5* 06/07/2014 1359   CREATININE 1.42* 06/15/2014 2319   CREATININE 1.7* 06/07/2014 1359      Component Value Date/Time   CALCIUM 9.4 06/15/2014 2319   CALCIUM 9.6 06/07/2014 1359   ALKPHOS 99 06/07/2014 1359   ALKPHOS 80 12/13/2013 2018   AST 32 06/07/2014 1359   AST 17  12/13/2013 2018   ALT 38 06/07/2014 1359   ALT 14 12/13/2013 2018   BILITOT 0.32 06/07/2014 1359   BILITOT 0.3 12/13/2013 2018       RADIOGRAPHIC STUDIES: I have personally reviewed the radiological images as listed and agreed with the findings in the report. Dg Chest 2 View  06/15/2014   CLINICAL DATA:  Acute onset of right shoulder and neck pain. Patient with current history of lung cancer. Initial encounter.  EXAM: CHEST  2 VIEW  COMPARISON:  Chest radiograph performed 12/14/2013, and CT of the chest performed 12/13/2013  FINDINGS: The lungs are well-aerated. The previously noted right upper lobe lung mass is significantly decreased in size, with underlying right apical scarring again seen. Mild left basilar opacity likely reflects atelectasis. No definite pleural effusion or pneumothorax is identified.  The heart is normal in size; the mediastinal contour is within normal limits. No acute osseous abnormalities are seen. A right-sided chest port is noted ending about the distal SVC.  IMPRESSION: 1. Right upper lobe lung mass is significantly decreased in size, with underlying right apical scarring again noted. 2. Mild left basilar airspace opacity likely reflects atelectasis. 3. No acute osseous abnormalities seen.   Electronically Signed   By: Garald Balding M.D.   On: 06/15/2014 23:42     ASSESSMENT & PLAN:  Adenocarcinoma of upper lobe of right lung She tolerates treatment poorly for multiple different reasons. She did not show up for her imaging studies today because she ended up in the emergency department for urgent evaluation. She does not feel well enough to pursue additional therapy today. I recommend rescheduling her imaging study and chemotherapy to 2 weeks. In the meantime, I will continue supportive care.   Anemia in neoplastic disease This is likely anemia of chronic disease. The patient denies recent history of bleeding such as epistaxis, hematuria or hematochezia. She is  asymptomatic from the anemia. We will observe for now.      Bilateral leg edema This is minimum. Previously, after I started her on hydrochlorothiazide, she developed acute renal failure. I recommend observation only.   Essential hypertension  Her blood pressure responded well to amlodipine. Continue the same.   Generalized anxiety disorder  This is significant. Likely exacerbated by recent reduction of her thyroid medicine. I also give her a  letter to come off work until the symptoms resolve     Right shoulder pain She has acute right shoulder pain related to muscle spasm. I gave her prescription pain medicine to use short-term. She is not able to move her right arm much. Recent imaging did not show any evidence of fracture. I recommend she stop working for at least 2 more weeks to allow adequate recovery of her pain.   Hypothyroidism She has significant hypothyroidism with significant medication adjustment. I will recheck it again next month.    No orders of the defined types were placed in this encounter.   All questions were answered. The patient knows to call the clinic with any problems, questions or concerns. No barriers to learning was detected. I spent 25 minutes counseling the patient face to face. The total time spent in the appointment was 30 minutes and more than 50% was on counseling and review of test results     [facility], [place], MD 06/17/2014 2:32 PM

## 2014-06-17 NOTE — Assessment & Plan Note (Signed)
She has acute right shoulder pain related to muscle spasm. I gave her prescription pain medicine to use short-term. She is not able to move her right arm much. Recent imaging did not show any evidence of fracture. I recommend she stop working for at least 2 more weeks to allow adequate recovery of her pain.

## 2014-06-21 ENCOUNTER — Telehealth: Payer: Self-pay | Admitting: *Deleted

## 2014-06-21 NOTE — Telephone Encounter (Signed)
Pt called regarding muscle spasms in legs; reports going to ED last week for muscle spasms and was given Valium, discharged with prescription for Robaxin- states, "The Robaxin isn't doing anything for me, I'd like a prescription for the Valium".  Dr. Alvy Bimler is off; Dr. Jana Hakim on call today; collab RN made aware and to discusss with physician.

## 2014-06-21 NOTE — Telephone Encounter (Signed)
This RN spoke with pt per her request for valium for shoulder discomfrot.  Valium was given x 1 in ER per visit earlier this month with above complaint.  Per review of medications pt states percocet " makes me sick on my stomach and I throw up "  Noted previous prescription for flexeril on med history list- discussed with pt who was able to find the bottle- she remembers no issues with taking it and has 18 tablets available.  Per phone discussion pt will take the flexeril and call if not beneficial.  No other needs at this time.

## 2014-06-24 ENCOUNTER — Encounter (HOSPITAL_COMMUNITY): Payer: Self-pay | Admitting: Emergency Medicine

## 2014-06-24 ENCOUNTER — Emergency Department (HOSPITAL_COMMUNITY)
Admission: EM | Admit: 2014-06-24 | Discharge: 2014-06-24 | Disposition: A | Discharge status: Home / Self Care | Payer: No Typology Code available for payment source | Attending: Emergency Medicine | Admitting: Emergency Medicine

## 2014-06-24 ENCOUNTER — Telehealth: Payer: Self-pay | Admitting: *Deleted

## 2014-06-24 DIAGNOSIS — Z79899 Other long term (current) drug therapy: Secondary | ICD-10-CM | POA: Diagnosis not present

## 2014-06-24 DIAGNOSIS — F410 Panic disorder [episodic paroxysmal anxiety] without agoraphobia: Secondary | ICD-10-CM | POA: Insufficient documentation

## 2014-06-24 DIAGNOSIS — Z85118 Personal history of other malignant neoplasm of bronchus and lung: Secondary | ICD-10-CM | POA: Diagnosis not present

## 2014-06-24 DIAGNOSIS — M62838 Other muscle spasm: Secondary | ICD-10-CM | POA: Diagnosis not present

## 2014-06-24 DIAGNOSIS — I10 Essential (primary) hypertension: Secondary | ICD-10-CM | POA: Insufficient documentation

## 2014-06-24 DIAGNOSIS — Z87891 Personal history of nicotine dependence: Secondary | ICD-10-CM | POA: Insufficient documentation

## 2014-06-24 DIAGNOSIS — Z880 Allergy status to penicillin: Secondary | ICD-10-CM | POA: Insufficient documentation

## 2014-06-24 DIAGNOSIS — Z8669 Personal history of other diseases of the nervous system and sense organs: Secondary | ICD-10-CM | POA: Insufficient documentation

## 2014-06-24 DIAGNOSIS — Z85850 Personal history of malignant neoplasm of thyroid: Secondary | ICD-10-CM | POA: Insufficient documentation

## 2014-06-24 DIAGNOSIS — M542 Cervicalgia: Secondary | ICD-10-CM | POA: Diagnosis present

## 2014-06-24 LAB — BASIC METABOLIC PANEL
ANION GAP: 12 (ref 5–15)
BUN: 13 mg/dL (ref 6–20)
CALCIUM: 9.1 mg/dL (ref 8.9–10.3)
CHLORIDE: 96 mmol/L — AB (ref 101–111)
CO2: 30 mmol/L (ref 22–32)
Creatinine, Ser: 1.54 mg/dL — ABNORMAL HIGH (ref 0.44–1.00)
GFR calc Af Amer: 41 mL/min — ABNORMAL LOW (ref >60)
GFR, EST NON AFRICAN AMERICAN: 36 mL/min — AB (ref >60)
GLUCOSE: 151 mg/dL — AB (ref 65–99)
POTASSIUM: 4 mmol/L (ref 3.5–5.1)
Sodium: 138 mmol/L (ref 135–145)

## 2014-06-24 LAB — CBC WITH DIFFERENTIAL/PLATELET
BASOS ABS: 0 10*3/uL (ref 0.0–0.1)
BASOS PCT: 0 % (ref 0–1)
EOS ABS: 0.1 10*3/uL (ref 0.0–0.7)
Eosinophils Relative: 1 % (ref 0–5)
HCT: 34.4 % — ABNORMAL LOW (ref 36.0–46.0)
HEMOGLOBIN: 11 g/dL — AB (ref 12.0–15.0)
LYMPHS ABS: 0.9 10*3/uL (ref 0.7–4.0)
Lymphocytes Relative: 9 % — ABNORMAL LOW (ref 12–46)
MCH: 30.2 pg (ref 26.0–34.0)
MCHC: 32 g/dL (ref 30.0–36.0)
MCV: 94.5 fL (ref 78.0–100.0)
MONOS PCT: 11 % (ref 3–12)
Monocytes Absolute: 1.1 10*3/uL — ABNORMAL HIGH (ref 0.1–1.0)
NEUTROS PCT: 79 % — AB (ref 43–77)
Neutro Abs: 8.4 10*3/uL — ABNORMAL HIGH (ref 1.7–7.7)
Platelets: 245 10*3/uL (ref 150–400)
RBC: 3.64 MIL/uL — ABNORMAL LOW (ref 3.87–5.11)
RDW: 13.3 % (ref 11.5–15.5)
WBC: 10.5 10*3/uL (ref 4.0–10.5)

## 2014-06-24 MED ORDER — DIAZEPAM 5 MG PO TABS
5.0000 mg | ORAL_TABLET | Freq: Once | ORAL | Status: AC
  Administered 2014-06-24: 5 mg via ORAL
  Filled 2014-06-24: qty 1

## 2014-06-24 MED ORDER — HEPARIN SOD (PORK) LOCK FLUSH 100 UNIT/ML IV SOLN
500.0000 [IU] | Freq: Once | INTRAVENOUS | Status: AC
  Administered 2014-06-24: 500 [IU]
  Filled 2014-06-24 (×2): qty 5

## 2014-06-24 MED ORDER — ONDANSETRON HCL 4 MG/2ML IJ SOLN
4.0000 mg | Freq: Once | INTRAMUSCULAR | Status: AC
  Administered 2014-06-24: 4 mg via INTRAVENOUS
  Filled 2014-06-24: qty 2

## 2014-06-24 MED ORDER — SODIUM CHLORIDE 0.9 % IV BOLUS (SEPSIS)
500.0000 mL | Freq: Once | INTRAVENOUS | Status: AC
  Administered 2014-06-24: 500 mL via INTRAVENOUS

## 2014-06-24 MED ORDER — SODIUM CHLORIDE 0.9 % IV BOLUS (SEPSIS): 1000.0000 mL | Freq: Once | INTRAVENOUS | Status: DC

## 2014-06-24 MED ORDER — HYDROMORPHONE HCL 1 MG/ML IJ SOLN
1.0000 mg | Freq: Once | INTRAMUSCULAR | Status: AC
  Administered 2014-06-24: 1 mg via INTRAVENOUS
  Filled 2014-06-24: qty 1

## 2014-06-24 MED ORDER — DIAZEPAM 5 MG PO TABS: 5.0000 mg | ORAL_TABLET | Freq: Three times a day (TID) | ORAL | Status: DC | PRN

## 2014-06-24 NOTE — ED Provider Notes (Signed)
CSN: [id number]     Arrival date & time 06/24/14  1148 History   First MD Initiated Contact with Patient 06/24/14 1347     Chief Complaint  Patient presents with  . Neck Pain     (Consider location/radiation/quality/duration/timing/severity/associated sxs/prior Treatment) HPI  59-year-old female presents with continued right trapezius/neck pain. It is been swollen and painful for the past 3-4 weeks. She was seen at [facility] one week ago given Dilaudid and oral Valium and states her pain was much better but when she went home her pain recurred and has not been improved. She's been on Percocet and has tried some Flexeril and another muscle relaxer without relief. She's been trying Lidoderm patch but continues to have significant pain. She's been feeling generically week due to poor oral intake over last 1 week but denies vomiting or diarrhea. There is no weakness or numbness or midline neck pain. No fevers. She has not called her oncologist or primary doctor but states she came to the ER to get pain better and to figure out what is wrong.  Past Medical History  Diagnosis Date  . Panic attack   . Hx of thyroid cancer     10 years ago  . Insomnia 12/29/2013  . S/P radiation therapy completed 12/16/13-02/05/14    RULL lung  . 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 Arthritis 04/27/2014  . Essential hypertension 04/27/2014   Past Surgical History  Procedure Laterality Date  . Thyroidectomy     No family history on file. History  Substance Use Topics  . Smoking status: Former Research scientist (life sciences)  . Smokeless tobacco: Never Used  . Alcohol Use: Yes   OB History    No data available     Review of Systems  Constitutional: Negative for fever.  Respiratory: Negative for shortness of breath.   Cardiovascular: Negative for chest pain.  Gastrointestinal: Negative for vomiting and abdominal pain.  Musculoskeletal: Positive for myalgias and neck pain.    Neurological: Positive for weakness. Negative for numbness.  All other systems reviewed and are negative.     Allergies  Codeine and Penicillins  Home Medications   Prior to Admission medications   Medication Sig Start Date End Date Taking? Authorizing Provider  amLODipine (NORVASC) 10 MG tablet Take 1 tablet (10 mg total) by mouth daily. 06/07/14   [name], MD  dexamethasone (DECADRON) 4 MG tablet take 2 tabs in the morning for 2 days every 3 weeks after chemo 04/12/14   [name], MD  folic acid (FOLVITE) 1 MG tablet Take 1 tablet (1 mg total) by mouth daily. Start 5-7 days before Alimta chemotherapy. Continue until 21 days after Alimta completed. 02/05/14   [name], MD  hydrochlorothiazide (HYDRODIURIL) 25 MG tablet Take 1 tablet (25 mg total) by mouth daily. 05/27/14   [name], MD  levothyroxine (SYNTHROID, LEVOTHROID) 125 MCG tablet Take 1 tablet (125 mcg total) by mouth daily. With 137 mcg daily Patient taking differently: Take 62.5 mcg by mouth daily. With 137 mcg daily 05/10/14   [name], MD  levothyroxine (SYNTHROID, LEVOTHROID) 137 MCG tablet Take 1 tablet (137 mcg total) by mouth daily. With 125 mcg daily 05/10/14   [name], MD  lidocaine-prilocaine (EMLA) cream Apply to affected area once Patient taking differently: Apply 1 application topically daily as needed (prior to port accessed). Apply to affected area once 02/05/14   [name], MD  LORazepam (ATIVAN)  0.5 MG tablet Take 1 tablet (0.5 mg total) by mouth every 8 (eight) hours as needed for anxiety (or nausea). 06/07/14   [name], MD  methocarbamol (ROBAXIN) 500 MG tablet Take 500 mg by mouth every 8 (eight) hours as needed for muscle spasms.    Historical Provider, MD  mirtazapine (REMERON) 15 MG tablet Take 1 tablet (15 mg total) by mouth at bedtime as needed (For sleep.). Patient taking differently: Take 15 mg by mouth at bedtime.  06/04/14   [name], MD  ondansetron (ZOFRAN) 4 MG tablet Take 1 tablet (4 mg  total) by mouth every 6 (six) hours as needed for nausea. 12/17/13   Annita Brod, MD  OxyCODONE (OXYCONTIN) 10 mg T12A 12 hr tablet Take 10 mg by mouth every 12 (twelve) hours.    Historical Provider, MD  OxyCODONE (OXYCONTIN) 10 mg T12A 12 hr tablet Take 1 tablet (10 mg total) by mouth every 12 (twelve) hours. 06/17/14   [name], MD  prochlorperazine (COMPAZINE) 10 MG tablet Take 1 tablet (10 mg total) by mouth every 6 (six) hours as needed for nausea or vomiting. 05/31/14   [name], MD  ranitidine (ZANTAC) 150 MG capsule Take 1 capsule (150 mg total) by mouth every evening. Patient taking differently: Take 150 mg by mouth daily as needed for heartburn.  04/06/14   [name], MD  temazepam (RESTORIL) 15 MG capsule Take 1 capsule (15 mg total) by mouth at bedtime as needed for sleep. 02/05/14   [name], MD  traMADol (ULTRAM) 50 MG tablet Take 1 tablet (50 mg total) by mouth every 6 (six) hours as needed. 05/27/14   Ni Gorsuch, MD   BP 166/87 mmHg  Pulse 99  Temp(Src) 98.7 F (37.1 C) (Oral)  Resp 16  SpO2 98% Physical Exam  Constitutional: She is oriented to person, place, and time. She appears well-developed and well-nourished.  HENT:  Head: Normocephalic and atraumatic.  Right Ear: External ear normal.  Left Ear: External ear normal.  Nose: Nose normal.  Eyes: EOM are normal. Pupils are equal, round, and reactive to light. Right eye exhibits no discharge. Left eye exhibits no discharge.  Neck: Normal range of motion. Neck supple. Muscular tenderness present. No rigidity.    Cardiovascular: Normal rate, regular rhythm and normal heart sounds.   Pulses:      Radial pulses are 2+ on the right side, and 2+ on the left side.  Pulmonary/Chest: Effort normal and breath sounds normal.  Abdominal: She exhibits no distension.  Neurological: She is alert and oriented to person, place, and time.  CN 2-12 grossly intact. 5/5 strength in all 4 extremities. Normal gross sensation  Skin:  Skin is warm and dry.  Vitals reviewed.   ED Course  Procedures (including critical care time) Labs Review Labs Reviewed  BASIC METABOLIC PANEL - Abnormal; Notable for the following:    Chloride 96 (*)    Glucose, Bld 151 (*)    Creatinine, Ser 1.54 (*)    GFR calc non Af Amer 36 (*)    GFR calc Af Amer 41 (*)    All other components within normal limits  CBC WITH DIFFERENTIAL/PLATELET - Abnormal; Notable for the following:    RBC 3.64 (*)    Hemoglobin 11.0 (*)    HCT 34.4 (*)    Neutrophils Relative % 79 (*)    Neutro Abs 8.4 (*)    Lymphocytes Relative 9 (*)    Monocytes Absolute 1.1 (*)  All other components within normal limits  CBC WITH DIFFERENTIAL/PLATELET    Imaging Review No results found.   EKG Interpretation None      MDM   Final diagnoses:  Trapezius muscle spasm    Patient appears to have continued muscular spasm of her trapezius muscle. Neurologically intact and has normal distal pulses. Spasm palpated over the trapezius, feels better with pain medicine here. Will discharge with a small amount of Valium for muscle relaxation and have recommended that she follow up with physical therapy and possibly massage therapy to help work out her muscle pain. She had a negative chest x-ray given her lung cancer one week ago and I do not feel any repeat imaging is necessary at this time. Lab work is also not significant change from one week ago. Stable for discharge to follow up with PCP and/or oncologist.    Sherwood Gambler, MD 06/24/14 [phone]

## 2014-06-24 NOTE — Telephone Encounter (Signed)
TC from pt stating that she has a paper prescription for oxycontin but her pharmacy ([facility] [place]) needs prior authorization.  Pt. States she cannot take other meds because they make her nauseous

## 2014-06-24 NOTE — ED Notes (Signed)
Pt has knot in neck -- seen at [facility] 1 week ago Tuesday-- seen by cancer dr and has had no relief from muscle relaxers, pain meds,  Pt is lung cancer pt-- having Chemo -- pt has a port-a-cath-- power port.

## 2014-06-25 ENCOUNTER — Telehealth: Payer: Self-pay | Admitting: *Deleted

## 2014-06-25 NOTE — Telephone Encounter (Signed)
[facility] [place]  called to inform office that patient only received 20 tablets of a 60 tablet count of oxycodone 10 mg prescription due to prior authorization.

## 2014-06-30 ENCOUNTER — Telehealth: Payer: Self-pay | Admitting: *Deleted

## 2014-06-30 NOTE — Telephone Encounter (Signed)
"  Dr. Alvy Bimler was to [facility] a [facility] to  [facility] [place] for lev[facility]roxine 200 mcg.  I'll run out shortly.  Please send today or in am.  Return number [phone]."  On 05-27-2014 TSH = 0.242

## 2014-07-01 ENCOUNTER — Encounter: Payer: Self-pay | Admitting: Hematology and Oncology

## 2014-07-01 ENCOUNTER — Other Ambulatory Visit: Payer: Self-pay | Admitting: *Deleted

## 2014-07-01 ENCOUNTER — Telehealth: Payer: Self-pay | Admitting: *Deleted

## 2014-07-01 ENCOUNTER — Other Ambulatory Visit: Payer: Self-pay | Admitting: Hematology and Oncology

## 2014-07-01 MED ORDER — OXYCODONE HCL ER 10 MG PO T12A: 10.0000 mg | EXTENDED_RELEASE_TABLET | Freq: Two times a day (BID) | ORAL | Status: DC

## 2014-07-01 MED ORDER — LEVOTHYROXINE SODIUM 200 MCG PO TABS: 200.0000 ug | ORAL_TABLET | Freq: Every day | ORAL | Status: DC

## 2014-07-01 NOTE — Telephone Encounter (Signed)
Collaborative nurse sent order to pharmacy and left message for patient about order for synthroid 200 mcg.

## 2014-07-01 NOTE — Telephone Encounter (Signed)
LVM informing pt of Rx Synthroid 200 mcg daily [facility] to [facility] [place].

## 2014-07-01 NOTE — Telephone Encounter (Signed)
ON 06/18/14 PT.RECEIVED OXYCONTIN '10MG'$  #20. SHE WAS IN SO MUCH PAIN PT. PAID FOR THE MEDICATION INSTEAD OF WAITING FOR THE PRIOR AUTHORIZATION. THEREFORE THE REMAINING #40 TABLETS WERE VOIDED. PT. WOULD LIKE TO PICK UP A NEW PRESCRIPTION FOR OXYCONTIN '10MG'$  #60 ON Monday. 2) PT. WAS SCHEDULED TO RETURN TO WORK ON 06/30/14 BUT DUE TO NECK AND SHOULDER PROBLEMS SHE WAS UNABLE TO RETURN TO WORK. PT. NEEDS A NOTE FAXED TO HER WORK, [employer] [facility] [facility] [place],, WHICH STATES SHE WILL SEE [place] ON 07/08/14 AND AT THAT TIME A DECISION WILL BE MADE WHEN PT. POSSIBLE BE ABLE TO RETURN TO WORK. FAX NUMBER [phone].

## 2014-07-01 NOTE — Telephone Encounter (Signed)
OK to refill, 30 tabs, 2 refills 200 mcg is the right dose

## 2014-07-01 NOTE — Telephone Encounter (Signed)
Faxed letter requested to pt's work as requested.   Rx for oxycontin ready to pick up.  Left VM for pt informing her of both.

## 2014-07-02 ENCOUNTER — Telehealth: Payer: Self-pay | Admitting: *Deleted

## 2014-07-02 ENCOUNTER — Other Ambulatory Visit: Payer: Self-pay | Admitting: Hematology and Oncology

## 2014-07-02 MED ORDER — DIAZEPAM 5 MG PO TABS: 5.0000 mg | ORAL_TABLET | Freq: Two times a day (BID) | ORAL | Status: DC | PRN

## 2014-07-02 NOTE — Telephone Encounter (Signed)
I will only give her a small prescription until I see her She has lorazepam & temazepam already. ALL are related to Valium (diazepam). She is only allowed to take 2 maximum per day of valium

## 2014-07-02 NOTE — Telephone Encounter (Signed)
Informed patient of prescription pick up. Patient verbalized understanding.

## 2014-07-02 NOTE — Telephone Encounter (Signed)
Patient informed. 

## 2014-07-02 NOTE — Telephone Encounter (Signed)
Patient recently was in the ED recently due to muscle spasms. Patient was given valium with relief. Patient physical therapy is pending. Patient would like to know if she can get a valium prescription for muscle relief. Neck therapist would not prescribe valium only flexeril. Please advise. Message sent to MD Alvy Bimler and [place].

## 2014-07-07 ENCOUNTER — Other Ambulatory Visit (HOSPITAL_BASED_OUTPATIENT_CLINIC_OR_DEPARTMENT_OTHER): Payer: No Typology Code available for payment source

## 2014-07-07 ENCOUNTER — Other Ambulatory Visit: Payer: Self-pay | Admitting: Hematology and Oncology

## 2014-07-07 ENCOUNTER — Encounter (HOSPITAL_COMMUNITY)
Admission: RE | Admit: 2014-07-07 | Discharge: 2014-07-07 | Disposition: A | Discharge status: Home / Self Care | Payer: No Typology Code available for payment source | Source: Ambulatory Visit | Attending: Hematology and Oncology | Admitting: Hematology and Oncology

## 2014-07-07 ENCOUNTER — Ambulatory Visit: Payer: No Typology Code available for payment source

## 2014-07-07 ENCOUNTER — Ambulatory Visit (HOSPITAL_COMMUNITY)
Admission: RE | Admit: 2014-07-07 | Discharge: 2014-07-07 | Disposition: A | Discharge status: Home / Self Care | Payer: No Typology Code available for payment source | Source: Ambulatory Visit | Attending: Hematology and Oncology | Admitting: Hematology and Oncology

## 2014-07-07 DIAGNOSIS — R918 Other nonspecific abnormal finding of lung field: Secondary | ICD-10-CM | POA: Diagnosis not present

## 2014-07-07 DIAGNOSIS — R5383 Other fatigue: Secondary | ICD-10-CM

## 2014-07-07 DIAGNOSIS — C3411 Malignant neoplasm of upper lobe, right bronchus or lung: Secondary | ICD-10-CM

## 2014-07-07 DIAGNOSIS — I722 Aneurysm of renal artery: Secondary | ICD-10-CM | POA: Insufficient documentation

## 2014-07-07 DIAGNOSIS — R590 Localized enlarged lymph nodes: Secondary | ICD-10-CM | POA: Insufficient documentation

## 2014-07-07 DIAGNOSIS — Z95828 Presence of other vascular implants and grafts: Secondary | ICD-10-CM

## 2014-07-07 LAB — COMPREHENSIVE METABOLIC PANEL (CC13)
ALK PHOS: 93 U/L (ref 40–150)
ALT: 11 U/L (ref 0–55)
AST: 20 U/L (ref 5–34)
Albumin: 3.3 g/dL — ABNORMAL LOW (ref 3.5–5.0)
Anion Gap: 12 mEq/L — ABNORMAL HIGH (ref 3–11)
BILIRUBIN TOTAL: 0.26 mg/dL (ref 0.20–1.20)
BUN: 14 mg/dL (ref 7.0–26.0)
CHLORIDE: 102 meq/L (ref 98–109)
CO2: 29 mEq/L (ref 22–29)
CREATININE: 1.3 mg/dL — AB (ref 0.6–1.1)
Calcium: 9.4 mg/dL (ref 8.4–10.4)
EGFR: 44 mL/min/{1.73_m2} — AB (ref >90)
Glucose: 153 mg/dl — ABNORMAL HIGH (ref 70–140)
Potassium: 3.1 mEq/L — ABNORMAL LOW (ref 3.5–5.1)
Sodium: 143 mEq/L (ref 136–145)
TOTAL PROTEIN: 8.4 g/dL — AB (ref 6.4–8.3)

## 2014-07-07 LAB — CBC WITH DIFFERENTIAL/PLATELET
BASO%: 0.2 % (ref 0.0–2.0)
Basophils Absolute: 0 10*3/uL (ref 0.0–0.1)
EOS%: 0.6 % (ref 0.0–7.0)
Eosinophils Absolute: 0.1 10*3/uL (ref 0.0–0.5)
HCT: 35.9 % (ref 34.8–46.6)
HGB: 11.7 g/dL (ref 11.6–15.9)
LYMPH%: 8.4 % — ABNORMAL LOW (ref 14.0–49.7)
MCH: 31.1 pg (ref 25.1–34.0)
MCHC: 32.6 g/dL (ref 31.5–36.0)
MCV: 95.5 fL (ref 79.5–101.0)
MONO#: 1 10*3/uL — ABNORMAL HIGH (ref 0.1–0.9)
MONO%: 7.5 % (ref 0.0–14.0)
NEUT#: 10.9 10*3/uL — ABNORMAL HIGH (ref 1.5–6.5)
NEUT%: 83.3 % — ABNORMAL HIGH (ref 38.4–76.8)
Platelets: 326 10*3/uL (ref 145–400)
RBC: 3.76 10*6/uL (ref 3.70–5.45)
RDW: 13.2 % (ref 11.2–14.5)
WBC: 13 10*3/uL — ABNORMAL HIGH (ref 3.9–10.3)
lymph#: 1.1 10*3/uL (ref 0.9–3.3)

## 2014-07-07 LAB — GLUCOSE, CAPILLARY: Glucose-Capillary: 136 mg/dL — ABNORMAL HIGH (ref 65–99)

## 2014-07-07 LAB — TSH CHCC: TSH: 8.367 m(IU)/L — ABNORMAL HIGH (ref 0.308–3.960)

## 2014-07-07 MED ORDER — GADOBENATE DIMEGLUMINE 529 MG/ML IV SOLN
20.0000 mL | Freq: Once | INTRAVENOUS | Status: AC | PRN
  Administered 2014-07-07: 20 mL via INTRAVENOUS

## 2014-07-07 MED ORDER — SODIUM CHLORIDE 0.9 % IJ SOLN
10.0000 mL | INTRAMUSCULAR | Status: DC | PRN
  Administered 2014-07-07: 10 mL via INTRAVENOUS
  Filled 2014-07-07: qty 10

## 2014-07-07 MED ORDER — HEPARIN SOD (PORK) LOCK FLUSH 100 UNIT/ML IV SOLN
500.0000 [IU] | Freq: Once | INTRAVENOUS | Status: DC
  Filled 2014-07-07: qty 5

## 2014-07-07 MED ORDER — FLUDEOXYGLUCOSE F - 18 (FDG) INJECTION
11.0000 | Freq: Once | INTRAVENOUS | Status: AC | PRN
  Administered 2014-07-07: 11 via INTRAVENOUS

## 2014-07-07 MED ORDER — SODIUM CHLORIDE 0.9 % IJ SOLN
10.0000 mL | INTRAMUSCULAR | Status: DC | PRN
  Filled 2014-07-07: qty 10

## 2014-07-07 NOTE — Progress Notes (Signed)
Pt arrived to flush room 2 and port accessed successfully. Pt had an appt  c/o PET Scan and MRI after leaving flush room. She was informed to return to flush room if port not deacessed by imaging department. Pt verbalized understanding.

## 2014-07-07 NOTE — Patient Instructions (Addendum)

## 2014-07-08 ENCOUNTER — Ambulatory Visit (HOSPITAL_BASED_OUTPATIENT_CLINIC_OR_DEPARTMENT_OTHER): Payer: No Typology Code available for payment source | Admitting: Hematology and Oncology

## 2014-07-08 ENCOUNTER — Telehealth: Payer: Self-pay | Admitting: Hematology and Oncology

## 2014-07-08 ENCOUNTER — Ambulatory Visit: Payer: No Typology Code available for payment source

## 2014-07-08 VITALS — BP 173/82 | HR 92 | Temp 98.3°F | Resp 16 | Ht 74.0 in | Wt 218.3 lb

## 2014-07-08 DIAGNOSIS — D630 Anemia in neoplastic disease: Secondary | ICD-10-CM | POA: Diagnosis not present

## 2014-07-08 DIAGNOSIS — M25511 Pain in right shoulder: Secondary | ICD-10-CM

## 2014-07-08 DIAGNOSIS — I10 Essential (primary) hypertension: Secondary | ICD-10-CM

## 2014-07-08 DIAGNOSIS — C3411 Malignant neoplasm of upper lobe, right bronchus or lung: Secondary | ICD-10-CM | POA: Diagnosis not present

## 2014-07-08 DIAGNOSIS — M4850XS Collapsed vertebra, not elsewhere classified, site unspecified, sequela of fracture: Secondary | ICD-10-CM

## 2014-07-08 DIAGNOSIS — M8448XS Pathological fracture, other site, sequela: Secondary | ICD-10-CM | POA: Diagnosis not present

## 2014-07-08 DIAGNOSIS — R5383 Other fatigue: Secondary | ICD-10-CM

## 2014-07-08 MED ORDER — ONDANSETRON HCL 8 MG PO TABS: 8.0000 mg | ORAL_TABLET | Freq: Four times a day (QID) | ORAL | Status: DC | PRN

## 2014-07-08 NOTE — Telephone Encounter (Signed)
s.w. pt and advised on JUNE appt...she will pick up completed sched at nxt visit

## 2014-07-08 NOTE — Assessment & Plan Note (Signed)
She has profound fatigue. Her thyroid medication was recently adjusted. The best dose for her right now would be 200 g daily and a plan to recheck thyroid function tests in 3 months

## 2014-07-08 NOTE — Assessment & Plan Note (Signed)
This is likely anemia of chronic disease. The patient denies recent history of bleeding such as epistaxis, hematuria or hematochezia. She is asymptomatic from the anemia. We will observe for now.  

## 2014-07-08 NOTE — Progress Notes (Signed)
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   12/13/2013 Imaging CT scan show large mass in the right lung apex likely representing primary lung carcinoma or large metastasis. Metastases demonstrated in the pretracheal lymph nodes, T1 vertebral body and possibly sacrum.   12/14/2013 Initial Diagnosis Adenocarcinoma of upper lobe of right lung   12/14/2013 Pathology Results Accession: [id number] biopsy from right upper lobe showed adenocarcinoma. [facility] testing was positive for several mutations without any known treatment options now   12/17/2013 Imaging MRI of the head is negative.   12/23/2013 - 02/05/2014 Radiation Therapy She received radiation therapy to the mediastinal mass and bone   12/28/2013 Imaging PET/CT scan showed mediastinal mass, lymphadenopathy and T1 involvement.   01/01/2014 - 01/29/2014 Chemotherapy Weekly carboplatin and Taxol were added   03/12/2014 Imaging Repeat PET scan showed near complete response to treatment.   03/16/2014 - 05/28/2014 Chemotherapy She started treatment with Alimta for maintenance   07/07/2014 Imaging PET scan showed disease progression    INTERVAL HISTORY: Please see below for problem oriented charting. She returns for further follow-up She is disappointed to see that imaging study showed disease progression She continues to have fatigue She complained of right neck muscle  spasm She denies bone pain or new neurological deficit Denies any fevers, chills, cough or signs of infection REVIEW OF SYSTEMS:   Constitutional: Denies fevers, chills or abnormal weight loss Eyes: Denies blurriness of vision Ears, nose, mouth, throat, and face: Denies mucositis or sore throat Respiratory: Denies cough, dyspnea or wheezes Cardiovascular: Denies palpitation, chest discomfort or lower extremity swelling Gastrointestinal:  Denies nausea, heartburn or change in bowel habits Skin: Denies abnormal skin rashes Lymphatics: Denies new lymphadenopathy or easy bruising Neurological:Denies numbness, tingling or new weaknesses Behavioral/Psych: Mood is stable, no new changes  All other systems were reviewed with the patient and are negative.  I have reviewed the past medical history, past surgical history, social history and family history with the patient and they are unchanged from previous note.  ALLERGIES:  is allergic to codeine and penicillins.  MEDICATIONS:  Current Outpatient Prescriptions  Medication Sig Dispense Refill  . amLODipine (NORVASC) 10 MG tablet TAKE 1 TABLET BY MOUTH EVERY DAY 30 tablet 0  . levothyroxine (SYNTHROID) 200 MCG tablet Take 1 tablet (200 mcg total) by mouth daily before breakfast. 30 tablet 2  . dexamethasone (DECADRON) 4 MG tablet take 2 tabs in the morning for 2 days every 3 weeks after chemo (Patient not taking: Reported on 07/08/2014) 30 tablet 1  . diazepam (VALIUM) 5 MG tablet Take 1 tablet (5 mg total) by mouth every 12 (twelve) hours as needed for muscle spasms. (Patient not taking: Reported on 07/08/2014) 20 tablet 0  . LORazepam (ATIVAN) 0.5 MG tablet Take 1 tablet (0.5 mg total) by mouth every 8 (eight) hours as needed for anxiety (or nausea). (Patient not taking: Reported  on 07/08/2014) 30 tablet 0  . methocarbamol (ROBAXIN) 500 MG tablet Take 500 mg by mouth every 8 (eight) hours as needed for muscle spasms.    . mirtazapine (REMERON) 15 MG  tablet Take 1 tablet (15 mg total) by mouth at bedtime as needed (For sleep.). (Patient not taking: Reported on 07/08/2014) 30 tablet 3  . ondansetron (ZOFRAN) 8 MG tablet Take 1 tablet (8 mg total) by mouth every 6 (six) hours as needed for nausea. 60 tablet 0  . OxyCODONE (OXYCONTIN) 10 mg T12A 12 hr tablet Take 1 tablet (10 mg total) by mouth every 12 (twelve) hours. (Patient not taking: Reported on 07/08/2014) 60 tablet 0  . prochlorperazine (COMPAZINE) 10 MG tablet Take 1 tablet (10 mg total) by mouth every 6 (six) hours as needed for nausea or vomiting. (Patient not taking: Reported on 07/08/2014) 30 tablet 1  . ranitidine (ZANTAC) 150 MG capsule Take 1 capsule (150 mg total) by mouth every evening. (Patient not taking: Reported on 07/08/2014) 30 capsule 6   No current facility-administered medications for this visit.    PHYSICAL EXAMINATION: ECOG PERFORMANCE STATUS: 1 - Symptomatic but completely ambulatory  Filed Vitals:   07/08/14 0817  BP: 173/82  Pulse: 92  Temp: 98.3 F (36.8 C)  Resp: 16   Filed Weights   07/08/14 0817  Weight: 218 lb 4.8 oz (99.02 kg)    GENERAL:alert, no distress and comfortable SKIN: skin color, texture, turgor are normal, no rashes or significant lesions EYES: normal, Conjunctiva are pink and non-injected, sclera clear OROPHARYNX:no exudate, no erythema and lips, buccal mucosa, and tongue normal  NECK: supple, thyroid normal size, non-tender, without nodularity LYMPH:  no palpable lymphadenopathy in the cervical, axillary or inguinal LUNGS: clear to auscultation and percussion with normal breathing effort HEART: regular rate & rhythm and no murmurs and no lower extremity edema ABDOMEN:abdomen soft, non-tender and normal bowel sounds Musculoskeletal:no cyanosis of digits and no clubbing  NEURO: alert & oriented x 3 with fluent speech, no focal motor/sensory deficits  LABORATORY DATA:  I have reviewed the data as listed    Component Value Date/Time    NA 143 07/07/2014 1011   NA 138 06/24/2014 1422   K 3.1* 07/07/2014 1011   K 4.0 06/24/2014 1422   CL 96* 06/24/2014 1422   CO2 29 07/07/2014 1011   CO2 30 06/24/2014 1422   GLUCOSE 153* 07/07/2014 1011   GLUCOSE 151* 06/24/2014 1422   BUN 14.0 07/07/2014 1011   BUN 13 06/24/2014 1422   CREATININE 1.3* 07/07/2014 1011   CREATININE 1.54* 06/24/2014 1422   CALCIUM 9.4 07/07/2014 1011   CALCIUM 9.1 06/24/2014 1422   PROT 8.4* 07/07/2014 1011   PROT 7.5 12/13/2013 2018   ALBUMIN 3.3* 07/07/2014 1011   ALBUMIN 3.7 12/13/2013 2018   AST 20 07/07/2014 1011   AST 17 12/13/2013 2018   ALT 11 07/07/2014 1011   ALT 14 12/13/2013 2018   ALKPHOS 93 07/07/2014 1011   ALKPHOS 80 12/13/2013 2018   BILITOT 0.26 07/07/2014 1011   BILITOT 0.3 12/13/2013 2018   GFRNONAA 36* 06/24/2014 1422   GFRAA 41* 06/24/2014 1422    No results found for: SPEP, UPEP  Lab Results  Component Value Date   WBC 13.0* 07/07/2014   NEUTROABS 10.9* 07/07/2014   HGB 11.7 07/07/2014   HCT 35.9 07/07/2014   MCV 95.5 07/07/2014   PLT 326 07/07/2014      Chemistry      Component Value Date/Time  NA 143 07/07/2014 1011   NA 138 06/24/2014 1422   K 3.1* 07/07/2014 1011   K 4.0 06/24/2014 1422   CL 96* 06/24/2014 1422   CO2 29 07/07/2014 1011   CO2 30 06/24/2014 1422   BUN 14.0 07/07/2014 1011   BUN 13 06/24/2014 1422   CREATININE 1.3* 07/07/2014 1011   CREATININE 1.54* 06/24/2014 1422      Component Value Date/Time   CALCIUM 9.4 07/07/2014 1011   CALCIUM 9.1 06/24/2014 1422   ALKPHOS 93 07/07/2014 1011   ALKPHOS 80 12/13/2013 2018   AST 20 07/07/2014 1011   AST 17 12/13/2013 2018   ALT 11 07/07/2014 1011   ALT 14 12/13/2013 2018   BILITOT 0.26 07/07/2014 1011   BILITOT 0.3 12/13/2013 2018       RADIOGRAPHIC STUDIES: I have personally reviewed the radiological images as listed and agreed with the findings in the report. Mr Jeri Cos Wo Contrast  07/07/2014   CLINICAL DATA:  Subsequent  treatment strategy for lung cancer. Evaluate for metastases.  EXAM: MRI HEAD WITHOUT AND WITH CONTRAST  TECHNIQUE: Multiplanar, multiecho pulse sequences of the brain and surrounding structures were obtained without and with intravenous contrast.  CONTRAST:  21m MULTIHANCE GADOBENATE DIMEGLUMINE 529 MG/ML IV SOLN  COMPARISON:  12/17/2013  FINDINGS: Calvarium and upper cervical spine: No focal marrow signal abnormality.  Orbits: Left cataract resection with chronic mild lobulation and enlargement of the left globe.  Sinuses and Mastoids: Clear. Mastoid and middle ears are clear.  Brain: No acute or remote infarct, hemorrhage, hydrocephalus, or mass lesion. No evidence of large vessel occlusion. No abnormal intracranial enhancement.  IMPRESSION: Negative brain MRI.  No evidence of metastatic disease.   Electronically Signed   By: JMonte FantasiaM.D.   On: 07/07/2014 14:56   Nm Pet Image Restag (ps) Skull Base To Thigh  07/07/2014   CLINICAL DATA:  Subsequent treatment strategy for lung cancer.  EXAM: NUCLEAR MEDICINE PET SKULL BASE TO THIGH  TECHNIQUE: 11.0 mCi F-18 FDG was injected intravenously. Full-ring PET imaging was performed from the skull base to thigh after the radiotracer. CT data was obtained and used for attenuation correction and anatomic localization.  FASTING BLOOD GLUCOSE:  Value: 136 mg/dl  COMPARISON:  03/12/2014.  FINDINGS: NECK  A peripherally calcified lesion in the left carotid space (CT image 28) has an SUV max of 8.1, of similar to prior exams. No hypermetabolic lymph nodes. CT images show no acute findings.  CHEST  There is scattered muscular uptake, likely physiologic. Minimal patchy metabolism within masslike consolidation in the medial aspect of the apical right upper lobe does not appear to be above blood pool. Lesion measures approximate 4.1 cm in greatest transaxial dimension, stable. Evolutionary changes of radiation therapy in the right upper lobe. There are multiple new  hematogenously distributed pulmonary nodules bilaterally. Index left lower lobe nodule measures 1.5 cm with an SUV max of 12.2. A new subcarinal lymph node measures 2.7 x 3.0 cm with an SUV max of 9.8. CT images show no pericardial or pleural effusion. Right IJ Port-A-Cath terminates in the right atrium. Upper/mid esophageal wall is thickened.  ABDOMEN/PELVIS  No abnormal hypermetabolism within the liver, adrenal glands, spleen or pancreas. No hypermetabolic lymph nodes. CT images show low-attenuation lesions in the liver measuring up to 12 mm, as before. Liver, gallbladder and adrenal glands are otherwise unremarkable. 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 High and low attenuation lesions off the lower pole left  kidney are too small to characterize, measuring up to 10 mm, stable. Spleen, pancreas, stomach and bowel are grossly unremarkable. No free fluid.  SKELETON  Focal hypermetabolism is seen along the left lateral aspect of C4, with associated lucency and destruction of the left transverse process and an SUV max of 12.4. Soft tissue lesion measures approximately 1.8 x 2.1 cm (CT image 38). No additional abnormal osseous hypermetabolism. Degenerative changes are seen in the spine.  IMPRESSION: 1. Hypermetabolic subcarinal adenopathy, bilateral pulmonary nodules and C4 lesion, consistent with metastatic disease. 2. Primary right upper lobe mass is stable in size and does not show metabolism above blood pool. Evolutionary changes of radiation therapy in the right upper lobe. 3. Upper/mid esophageal wall thickening, possibly related to radiation therapy. Please correlate clinically. 4. Small right renal artery aneurysm.   Electronically Signed   By: Lorin Picket M.D.   On: 07/07/2014 13:39     ASSESSMENT & PLAN:  Adenocarcinoma of upper lobe of right lung Unfortunately, she has evidence of disease progression I recommend switching her treatment to Nivolumab The risk, benefit,  side effects of Nivolumab was discussed with the patient and she agreed to proceed We will start her treatment next week and I will reassess for symptoms and side effects prior to cycle 2 of treatment  Anemia in neoplastic disease This is likely anemia of chronic disease. The patient denies recent history of bleeding such as epistaxis, hematuria or hematochezia. She is asymptomatic from the anemia. We will observe for now.       Essential hypertension  Her blood pressure responded well to amlodipine. Continue the same. She is having a little bit more high blood pressure today likely related to anxiety  I will reassess in her next visit   Other fatigue She has profound fatigue. Her thyroid medication was recently adjusted. The best dose for her right now would be 200 g daily and a plan to recheck thyroid function tests in 3 months    Pathologic compression fracture of spine She continues to have intermittent back pain She has right shoulder pain which does not correlate to the cervical spine lesion seen on PET CT scan I discussed with her varus different pain medication options but she declined most of them because of side effects She is awaiting for prior authorization for OxyContin In the meantime, I will see if the radiation oncologist feels that radiation to the cervical spine would be appropriate. Otherwise I recommend her to proceed with systemic chemotherapy and repeat imaging study to follow on the cervical spine lesion   Orders Placed This Encounter  Procedures  . CBC with Differential    Standing Status: Standing     Number of Occurrences: 20     Standing Expiration Date: 07/09/2015  . Comprehensive metabolic panel    Standing Status: Standing     Number of Occurrences: 20     Standing Expiration Date: 07/09/2015  . Ambulatory referral to Social Work    Referral Priority:  Routine    Referral Type:  Consultation    Referral Reason:  Specialty Services Required     Number of Visits Requested:  1   All questions were answered. The patient knows to call the clinic with any problems, questions or concerns. No barriers to learning was detected. I spent 30 minutes counseling the patient face to face. The total time spent in the appointment was 40 minutes and more than 50% was on counseling and review of test results  [facility], Karlisha Mathena, MD 07/08/2014 1:04 PM

## 2014-07-08 NOTE — Assessment & Plan Note (Signed)
Unfortunately, she has evidence of disease progression I recommend switching her treatment to Nivolumab The risk, benefit, side effects of Nivolumab was discussed with the patient and she agreed to proceed We will start her treatment next week and I will reassess for symptoms and side effects prior to cycle 2 of treatment

## 2014-07-08 NOTE — Assessment & Plan Note (Signed)
Her blood pressure responded well to amlodipine. Continue the same. She is having a little bit more high blood pressure today likely related to anxiety  I will reassess in her next visit

## 2014-07-08 NOTE — Assessment & Plan Note (Signed)
She continues to have intermittent back pain She has right shoulder pain which does not correlate to the cervical spine lesion seen on PET CT scan I discussed with her varus different pain medication options but she declined most of them because of side effects She is awaiting for prior authorization for OxyContin In the meantime, I will see if the radiation oncologist feels that radiation to the cervical spine would be appropriate. Otherwise I recommend her to proceed with systemic chemotherapy and repeat imaging study to follow on the cervical spine lesion

## 2014-07-15 ENCOUNTER — Ambulatory Visit (HOSPITAL_BASED_OUTPATIENT_CLINIC_OR_DEPARTMENT_OTHER): Payer: No Typology Code available for payment source

## 2014-07-15 ENCOUNTER — Telehealth: Payer: Self-pay | Admitting: *Deleted

## 2014-07-15 VITALS — BP 160/85 | HR 80 | Temp 98.7°F | Resp 18

## 2014-07-15 DIAGNOSIS — C3411 Malignant neoplasm of upper lobe, right bronchus or lung: Secondary | ICD-10-CM | POA: Diagnosis not present

## 2014-07-15 DIAGNOSIS — Z5112 Encounter for antineoplastic immunotherapy: Secondary | ICD-10-CM | POA: Diagnosis not present

## 2014-07-15 MED ORDER — SODIUM CHLORIDE 0.9 % IV SOLN
Freq: Once | INTRAVENOUS | Status: AC
  Administered 2014-07-15: 14:00:00 via INTRAVENOUS

## 2014-07-15 MED ORDER — SODIUM CHLORIDE 0.9 % IV SOLN
3.0000 mg/kg | Freq: Once | INTRAVENOUS | Status: AC
  Administered 2014-07-15: 300 mg via INTRAVENOUS
  Filled 2014-07-15: qty 30

## 2014-07-15 MED ORDER — SODIUM CHLORIDE 0.9 % IJ SOLN
10.0000 mL | INTRAMUSCULAR | Status: DC | PRN
  Administered 2014-07-15: 10 mL
  Filled 2014-07-15: qty 10

## 2014-07-15 MED ORDER — HEPARIN SOD (PORK) LOCK FLUSH 100 UNIT/ML IV SOLN
500.0000 [IU] | Freq: Once | INTRAVENOUS | Status: AC | PRN
  Administered 2014-07-15: 500 [IU]
  Filled 2014-07-15: qty 5

## 2014-07-15 NOTE — Telephone Encounter (Signed)
Pt left VM states she needs Cataract surgery on her right eye,.  She wants to schedule it and asks if this is ok to do with chemo and her other medications?

## 2014-07-15 NOTE — Telephone Encounter (Signed)
No problem for cataract while on Rx

## 2014-07-15 NOTE — Telephone Encounter (Signed)
Left VM for pt informing her of Dr. Calton Dach reply.  Asked her to return nurse's call if any questions.

## 2014-07-15 NOTE — Patient Instructions (Signed)
Nivolumab injection What is this medicine? NIVOLUMAB (nye VOL ue mab) is used to treat certain types of melanoma and lung cancer. This medicine may be used for other purposes; ask your health care provider or pharmacist if you have questions. COMMON BRAND NAME(S): Opdivo What should I tell my health care provider before I take this medicine? They need to know if you have any of these conditions: -eye disease, vision problems -history of pancreatitis -immune system problems -inflammatory bowel disease -kidney disease -liver disease -lung disease -lupus -myasthenia gravis -multiple sclerosis -organ transplant -stomach or intestine problems -thyroid disease -tingling of the fingers or toes, or other nerve disorder -an unusual or allergic reaction to nivolumab, other medicines, foods, dyes, or preservatives -pregnant or trying to get pregnant -breast-feeding How should I use this medicine? This medicine is for infusion into a vein. It is given by a health care professional in a hospital or clinic setting. A special MedGuide will be given to you before each treatment. Be sure to read this information carefully each time. Talk to your pediatrician regarding the use of this medicine in children. Special care may be needed. Overdosage: If you think you've taken too much of this medicine contact a poison control center or emergency room at once. Overdosage: If you think you have taken too much of this medicine contact a poison control center or emergency room at once. NOTE: This medicine is only for you. Do not share this medicine with others. What if I miss a dose? It is important not to miss your dose. Call your doctor or health care professional if you are unable to keep an appointment. What may interact with this medicine? Interactions have not been studied. This list may not describe all possible interactions. Give your health care provider a list of all the medicines, herbs,  non-prescription drugs, or dietary supplements you use. Also tell them if you smoke, drink alcohol, or use illegal drugs. Some items may interact with your medicine. What should I watch for while using this medicine? Tell your doctor or healthcare professional if your symptoms do not start to get better or if they get worse. Your condition will be monitored carefully while you are receiving this medicine. You may need blood work done while you are taking this medicine. What side effects may I notice from receiving this medicine? Side effects that you should report to your doctor or health care professional as soon as possible: -allergic reactions like skin rash, itching or hives, swelling of the face, lips, or tongue -black, tarry stools -bloody or watery diarrhea -changes in vision -chills -cough -depressed mood -eye pain -feeling anxious -fever -general ill feeling or flu-like symptoms -hair loss -loss of appetite -low blood counts - this medicine may decrease the number of white blood cells, red blood cells and platelets. You may be at increased risk for infections and bleeding -pain, tingling, numbness in the hands or feet -redness, blistering, peeling or loosening of the skin, including inside the mouth -red pinpoint spots on skin -signs of decreased platelets or bleeding - bruising, pinpoint red spots on the skin, black, tarry stools, blood in the urine -signs of decreased red blood cells - unusually weak or tired, feeling faint or lightheaded, falls -signs of infection - fever or chills, cough, sore throat, pain or trouble passing urine -signs and symptoms of a dangerous change in heartbeat or heart rhythm like chest pain; dizziness; fast or irregular heartbeat; palpitations; feeling faint or lightheaded, falls; breathing problems -signs   and symptoms of high blood sugar such as dizziness; dry mouth; dry skin; fruity breath; nausea; stomach pain; increased hunger or thirst; increased  urination -signs and symptoms of kidney injury like trouble passing urine or change in the amount of urine -signs and symptoms of liver injury like dark yellow or brown urine; general ill feeling or flu-like symptoms; light-colored stools; loss of appetite; nausea; right upper belly pain; unusually weak or tired; yellowing of the eyes or skin -signs and symptoms of increased potassium like muscle weakness; chest pain; or fast, irregular heartbeat -signs and symptoms of low potassium like muscle cramps or muscle pain; chest pain; dizziness; feeling faint or lightheaded, falls; palpitations; breathing problems; or fast, irregular heartbeat -swelling of the ankles, feet, hands -weight gainSide effects that usually do not require medical attention (report to your doctor or health care professional if they continue or are bothersome): -constipation -general ill feeling or flu-like symptoms -hair loss -loss of appetite -nausea, vomiting This list may not describe all possible side effects. Call your doctor for medical advice about side effects. You may report side effects to FDA at 1-800-FDA-1088. Where should I keep my medicine? This drug is given in a hospital or clinic and will not be stored at home. NOTE: This sheet is a summary. It may not cover all possible information. If you have questions about this medicine, talk to your doctor, pharmacist, or health care provider.  2015, [employer]vier/Gold Standard. (2013-04-06 13:18:19)  

## 2014-07-16 ENCOUNTER — Encounter: Payer: Self-pay | Admitting: Hematology and Oncology

## 2014-07-16 ENCOUNTER — Telehealth: Payer: Self-pay | Admitting: *Deleted

## 2014-07-16 NOTE — Progress Notes (Signed)
I faxed prior auth to [employer] for oxycontin

## 2014-07-16 NOTE — Telephone Encounter (Signed)
TC from pt's [facility] regarding prior auth for oxycontin. Instructed [facility] to send fax to [facility] @ [phone]

## 2014-07-20 ENCOUNTER — Encounter: Payer: Self-pay | Admitting: Hematology and Oncology

## 2014-07-20 NOTE — Progress Notes (Addendum)
Histology and Location of Primary Cancer: [place]en[place]am of upper right lung, disease progression   Sites of Visceral and Bony Metastatic Disease: cervical spine lesion (C-4) seen on Pet scan 07/07/14   Location(s) of Symptomatic Metastases: right neck muscle  spasms  Past/Anticipated chemotherapy by medical oncology, if any: Dr. Alvy Bimler 07/08/14 seen, Nivolumab infusion   07/29/14   Pain on a scale of 0-10 is: right neck muscle spasms, intermittent back pain right side neck stiffness and tightness starting back up   If Spine Met(s), symptoms, if any, include:  Bowel/Bladder retention or incontinence (please describe):   Numbness or weakness in extremities (please describe): profound fatigue   Current Decadron regimen, if applicable: no  Ambulatory status? Walker? Wheelchair?:ambultory  SAFETY ISSUES: NO  Prior radiation? Yes, Mediastinal mass and bone 12/23/13-02/05/14  Pacemaker/ICD? NO  Possible current pregnancy? N/A Is the patient on methotrexate? NO Current Complaints / other details:  Persistent esophagitis,pepcid helping  Allergies:PCN,= unsure  Codeine =vomiting BP 181/108 mmHg  Pulse 87  Temp(Src) 98.7 F (37.1 C) (Oral)  Resp 20  Ht 6' 2"  (1.88 m)  Wt 220 lb 8 oz (100.018 kg)  BMI 28.30 kg/m2  SpO2 99%  Wt Readings from Last 3 Encounters:  07/21/14 220 lb 8 oz (100.018 kg)  07/08/14 218 lb 4.8 oz (99.02 kg)  06/17/14 225 lb 14.4 oz (102.468 kg)   Gaspar Garbe, RN II Rad/Onc

## 2014-07-20 NOTE — Progress Notes (Signed)
I faxed office notes to [facility] for appeal for oxycontin. [phone]

## 2014-07-21 ENCOUNTER — Ambulatory Visit
Admission: RE | Admit: 2014-07-21 | Discharge: 2014-07-21 | Disposition: A | Discharge status: Home / Self Care | Payer: No Typology Code available for payment source | Source: Ambulatory Visit | Attending: Radiation Oncology | Admitting: Radiation Oncology

## 2014-07-21 ENCOUNTER — Encounter: Payer: Self-pay | Admitting: Radiation Oncology

## 2014-07-21 ENCOUNTER — Encounter: Payer: Self-pay | Admitting: Skilled Nursing Facility1

## 2014-07-21 VITALS — BP 181/108 | HR 87 | Temp 98.7°F | Resp 20 | Ht 74.0 in | Wt 220.5 lb

## 2014-07-21 DIAGNOSIS — C3411 Malignant neoplasm of upper lobe, right bronchus or lung: Secondary | ICD-10-CM | POA: Diagnosis not present

## 2014-07-21 DIAGNOSIS — Z87891 Personal history of nicotine dependence: Secondary | ICD-10-CM | POA: Insufficient documentation

## 2014-07-21 DIAGNOSIS — C7951 Secondary malignant neoplasm of bone: Secondary | ICD-10-CM

## 2014-07-21 DIAGNOSIS — R918 Other nonspecific abnormal finding of lung field: Secondary | ICD-10-CM | POA: Insufficient documentation

## 2014-07-21 NOTE — Progress Notes (Signed)
Radiation Oncology         [phone] ________________________________  Name: Jasmine Grant MRN: [id number]  Date: 07/21/2014  DOB: 11-28-1955  Follow-Up Visit Note  CC: Reginia Naas, MD  Carol Ada, MD  Diagnosis:    Oncology History   Adenocarcinoma of upper lobe of right lung   Staging form: Lung, AJCC 6th Edition     Clinical stage from 12/29/2013: Stage IV (T4, N3, M1) - Signed by [name], MD on 12/29/2013       Adenocarcinoma of upper lobe of right lung   12/13/2013 - 12/17/2013 Hospital Admission She was admitted to the hospital for workup of severe pain and neurological deficit and was found to have 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   12/13/2013 Imaging CT scan show large mass in the right lung apex likely representing primary lung carcinoma or large metastasis. Metastases demonstrated in the pretracheal lymph nodes, T1 vertebral body and possibly sacrum.   12/14/2013 Initial Diagnosis Adenocarcinoma of upper lobe of right lung   12/14/2013 Pathology Results Accession: [id number] biopsy from right upper lobe showed adenocarcinoma. Foundation One testing was positive for several mutations without any known treatment options now   12/17/2013 Imaging MRI of the head is negative.   12/23/2013 - 02/05/2014 Radiation Therapy She received radiation therapy to the mediastinal mass and bone   12/28/2013 Imaging PET/CT scan showed mediastinal mass, lymphadenopathy and T1 involvement.   01/01/2014 - 01/29/2014 Chemotherapy Weekly carboplatin and Taxol were added   03/12/2014 Imaging Repeat PET scan showed near complete response to treatment.   03/16/2014 - 05/28/2014 Chemotherapy She started treatment with Alimta for maintenance   07/07/2014 Imaging PET scan showed disease progression     Narrative:  The patient returns today for routine follow-up. The pt would have muscle spasms in her right supraclavicular region that would radiate down her right  shoulder. She would also have pain in that region and had a steroid shot in that area. She also had swelling in her ankles and she has fatigue. The pt has a cough, but denies a fever. The pt notes she has a cataract in the right eye and will have surgery for it soon. She is being referred to radiation oncology to discuss if she should have physical therapy and to discuss radiotherapy to a lesion, left of the C4 vertebral body.  ALLERGIES:  is allergic to codeine and penicillins.  Meds: Current Outpatient Prescriptions  Medication Sig Dispense Refill  . amLODipine (NORVASC) 10 MG tablet TAKE 1 TABLET BY MOUTH EVERY DAY 30 tablet 0  . levothyroxine (SYNTHROID) 200 MCG tablet Take 1 tablet (200 mcg total) by mouth daily before breakfast. 30 tablet 2  . ondansetron (ZOFRAN) 8 MG tablet Take 1 tablet (8 mg total) by mouth every 6 (six) hours as needed for nausea. 60 tablet 0  . dexamethasone (DECADRON) 4 MG tablet take 2 tabs in the morning for 2 days every 3 weeks after chemo (Patient not taking: Reported on 07/08/2014) 30 tablet 1  . diazepam (VALIUM) 5 MG tablet Take 1 tablet (5 mg total) by mouth every 12 (twelve) hours as needed for muscle spasms. (Patient not taking: Reported on 07/08/2014) 20 tablet 0  . LORazepam (ATIVAN) 0.5 MG tablet Take 1 tablet (0.5 mg total) by mouth every 8 (eight) hours as needed for anxiety (or nausea). (Patient not taking: Reported on 07/08/2014) 30 tablet 0  . methocarbamol (ROBAXIN) 500 MG tablet Take 500 mg by mouth  every 8 (eight) hours as needed for muscle spasms.    . mirtazapine (REMERON) 15 MG tablet Take 1 tablet (15 mg total) by mouth at bedtime as needed (For sleep.). (Patient not taking: Reported on 07/08/2014) 30 tablet 3  . OxyCODONE (OXYCONTIN) 10 mg T12A 12 hr tablet Take 1 tablet (10 mg total) by mouth every 12 (twelve) hours. (Patient not taking: Reported on 07/08/2014) 60 tablet 0  . prochlorperazine (COMPAZINE) 10 MG tablet Take 1 tablet (10 mg total) by mouth  every 6 (six) hours as needed for nausea or vomiting. (Patient not taking: Reported on 07/08/2014) 30 tablet 1  . ranitidine (ZANTAC) 150 MG capsule Take 1 capsule (150 mg total) by mouth every evening. (Patient not taking: Reported on 07/08/2014) 30 capsule 6   No current facility-administered medications for this encounter.    Physical Findings: The patient is in no acute distress. Patient is alert and oriented.  height is '6\' 2"'$  (1.88 m) and weight is 220 lb 8 oz (100.018 kg). Her oral temperature is 98.7 F (37.1 C). Her blood pressure is 181/108 and her pulse is 87. Her respiration is 20 and oxygen saturation is 99%. .    Lab Findings: Lab Results  Component Value Date   WBC 13.0* 07/07/2014   HGB 11.7 07/07/2014   HCT 35.9 07/07/2014   MCV 95.5 07/07/2014   PLT 326 07/07/2014     Radiographic Findings: Mr Jeri Cos OE Contrast  07/07/2014   CLINICAL DATA:  Subsequent treatment strategy for lung cancer. Evaluate for metastases.  EXAM: MRI HEAD WITHOUT AND WITH CONTRAST  TECHNIQUE: Multiplanar, multiecho pulse sequences of the brain and surrounding structures were obtained without and with intravenous contrast.  CONTRAST:  61m MULTIHANCE GADOBENATE DIMEGLUMINE 529 MG/ML IV SOLN  COMPARISON:  12/17/2013  FINDINGS: Calvarium and upper cervical spine: No focal marrow signal abnormality.  Orbits: Left cataract resection with chronic mild lobulation and enlargement of the left globe.  Sinuses and Mastoids: Clear. Mastoid and middle ears are clear.  Brain: No acute or remote infarct, hemorrhage, hydrocephalus, or mass lesion. No evidence of large vessel occlusion. No abnormal intracranial enhancement.  IMPRESSION: Negative brain MRI.  No evidence of metastatic disease.   Electronically Signed   By: JMonte FantasiaM.D.   On: 07/07/2014 14:56   Nm Pet Image Restag (ps) Skull Base To Thigh  07/07/2014   CLINICAL DATA:  Subsequent treatment strategy for lung cancer.  EXAM: NUCLEAR MEDICINE PET SKULL  BASE TO THIGH  TECHNIQUE: 11.0 mCi F-18 FDG was injected intravenously. Full-ring PET imaging was performed from the skull base to thigh after the radiotracer. CT data was obtained and used for attenuation correction and anatomic localization.  FASTING BLOOD GLUCOSE:  Value: 136 mg/dl  COMPARISON:  03/12/2014.  FINDINGS: NECK  A peripherally calcified lesion in the left carotid space (CT image 28) has an SUV max of 8.1, of similar to prior exams. No hypermetabolic lymph nodes. CT images show no acute findings.  CHEST  There is scattered muscular uptake, likely physiologic. Minimal patchy metabolism within masslike consolidation in the medial aspect of the apical right upper lobe does not appear to be above blood pool. Lesion measures approximate 4.1 cm in greatest transaxial dimension, stable. Evolutionary changes of radiation therapy in the right upper lobe. There are multiple new hematogenously distributed pulmonary nodules bilaterally. Index left lower lobe nodule measures 1.5 cm with an SUV max of 12.2. A new subcarinal lymph node measures 2.7 x 3.0 cm  with an SUV max of 9.8. CT images show no pericardial or pleural effusion. Right IJ Port-A-Cath terminates in the right atrium. Upper/mid esophageal wall is thickened.  ABDOMEN/PELVIS  No abnormal hypermetabolism within the liver, adrenal glands, spleen or pancreas. No hypermetabolic lymph nodes. CT images show low-attenuation lesions in the liver measuring up to 12 mm, as before. Liver, gallbladder and adrenal glands are otherwise unremarkable. 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 High and low attenuation lesions off the lower pole left kidney are too small to characterize, measuring up to 10 mm, stable. Spleen, pancreas, stomach and bowel are grossly unremarkable. No free fluid.  SKELETON  Focal hypermetabolism is seen along the left lateral aspect of C4, with associated lucency and destruction of the left transverse process and an  SUV max of 12.4. Soft tissue lesion measures approximately 1.8 x 2.1 cm (CT image 38). No additional abnormal osseous hypermetabolism. Degenerative changes are seen in the spine.  IMPRESSION: 1. Hypermetabolic subcarinal adenopathy, bilateral pulmonary nodules and C4 lesion, consistent with metastatic disease. 2. Primary right upper lobe mass is stable in size and does not show metabolism above blood pool. Evolutionary changes of radiation therapy in the right upper lobe. 3. Upper/mid esophageal wall thickening, possibly related to radiation therapy. Please correlate clinically. 4. Small right renal artery aneurysm.   Electronically Signed   By: Lorin Picket M.D.   On: 07/07/2014 13:39    Impression: Metastatic lung cancer with a hypermetabolic metastasis at C4 vertebral body. I reviewed the scan and the pt is a good candidate for palliative radiation to this area   Plan: Will proceed simulation within the next couple of weeks and anticipate 2 weeks of radiation afterwards. I advised the pt to buy mucinex for her cough. I believe the pt is fit for physical therapy. The pt signed a consent form and this was placed in her chart.   This document serves as a record of services personally performed by Kyung Rudd, MD. It was created on his behalf by Darcus Austin, a trained medical scribe. The creation of this record is based on the scribe's personal observations and the provider's statements to them. This document has been checked and approved by the attending provider.    The patient was seen today for 30 minutes, with the majority of the time spent counseling the patient on his diagnosis of cancer and coordinating his care.    Jodelle Gross, M.D., Ph.D.

## 2014-07-21 NOTE — Progress Notes (Signed)
Please see the Nurse Progress Note in the MD Initial Consult Encounter for this patient. 

## 2014-07-21 NOTE — Progress Notes (Signed)
Subjective:     Patient ID: Jasmine Grant, female   DOB: 1955/07/15, 59 y.o.   MRN: [id number]  HPI   Review of Systems     Objective:   Physical Exam To assist the pt in identifying dietary strategies in gaining some lost wt back.     Assessment:     Pt identified as being malnourished due to losing wt. Pt was contacted via the telephone at [phone]. Pt was unavailable.     Plan:     Dietitian left a voicemail prompting the pt to contact Ernestene Kiel CSO,RD,LDN at [phone].

## 2014-07-22 ENCOUNTER — Telehealth: Payer: Self-pay | Admitting: Nutrition

## 2014-07-22 NOTE — Telephone Encounter (Signed)
Patient was contacted by phone secondary to increased risk for malnutrition secondary to weight loss and poor appetite. Patient reports increased fatigue. She admits to decreased oral intake secondary to increased pain.  This has now resolved. Patient reports weight has stabilized 220 pounds. Patient reports she is also trying to eat a healthier diet consisting of more fruits and vegetables.   Educated patient that she should be sure to incorporate adequate protein and reviewed high protein foods with patient. Encouraged patient to increase activity level as permitted by physician. Questions were answered.  Teach back method was used.  **Disclaimer: This note was dictated with voice recognition software. Similar sounding words can inadvertently be transcribed and this note may contain transcription errors which may not have been corrected upon publication of note.**

## 2014-07-23 ENCOUNTER — Telehealth: Payer: Self-pay

## 2014-07-23 NOTE — Telephone Encounter (Signed)
Prior auth request fax for oxycontin to [facility]

## 2014-07-29 ENCOUNTER — Ambulatory Visit (HOSPITAL_BASED_OUTPATIENT_CLINIC_OR_DEPARTMENT_OTHER): Payer: No Typology Code available for payment source

## 2014-07-29 ENCOUNTER — Ambulatory Visit (HOSPITAL_BASED_OUTPATIENT_CLINIC_OR_DEPARTMENT_OTHER): Payer: No Typology Code available for payment source | Admitting: Hematology and Oncology

## 2014-07-29 ENCOUNTER — Other Ambulatory Visit (HOSPITAL_BASED_OUTPATIENT_CLINIC_OR_DEPARTMENT_OTHER): Payer: No Typology Code available for payment source

## 2014-07-29 ENCOUNTER — Ambulatory Visit: Payer: No Typology Code available for payment source

## 2014-07-29 ENCOUNTER — Encounter: Payer: Self-pay | Admitting: *Deleted

## 2014-07-29 ENCOUNTER — Encounter: Payer: Self-pay | Admitting: Hematology and Oncology

## 2014-07-29 ENCOUNTER — Telehealth: Payer: Self-pay | Admitting: Hematology and Oncology

## 2014-07-29 VITALS — BP 179/81 | HR 88 | Temp 98.3°F | Resp 18 | Ht 75.0 in | Wt 219.3 lb

## 2014-07-29 DIAGNOSIS — Z95828 Presence of other vascular implants and grafts: Secondary | ICD-10-CM

## 2014-07-29 DIAGNOSIS — M8448XS Pathological fracture, other site, sequela: Secondary | ICD-10-CM | POA: Diagnosis not present

## 2014-07-29 DIAGNOSIS — C3411 Malignant neoplasm of upper lobe, right bronchus or lung: Secondary | ICD-10-CM

## 2014-07-29 DIAGNOSIS — N19 Unspecified kidney failure: Secondary | ICD-10-CM

## 2014-07-29 DIAGNOSIS — R5383 Other fatigue: Secondary | ICD-10-CM

## 2014-07-29 DIAGNOSIS — M4850XS Collapsed vertebra, not elsewhere classified, site unspecified, sequela of fracture: Secondary | ICD-10-CM

## 2014-07-29 DIAGNOSIS — I10 Essential (primary) hypertension: Secondary | ICD-10-CM

## 2014-07-29 DIAGNOSIS — Z5112 Encounter for antineoplastic immunotherapy: Secondary | ICD-10-CM

## 2014-07-29 DIAGNOSIS — K209 Esophagitis, unspecified without bleeding: Secondary | ICD-10-CM

## 2014-07-29 LAB — COMPREHENSIVE METABOLIC PANEL (CC13)
ALBUMIN: 3.6 g/dL (ref 3.5–5.0)
ALT: 11 U/L (ref 0–55)
AST: 17 U/L (ref 5–34)
Alkaline Phosphatase: 100 U/L (ref 40–150)
Anion Gap: 13 mEq/L — ABNORMAL HIGH (ref 3–11)
BILIRUBIN TOTAL: 0.53 mg/dL (ref 0.20–1.20)
BUN: 16.2 mg/dL (ref 7.0–26.0)
CALCIUM: 10.1 mg/dL (ref 8.4–10.4)
CHLORIDE: 102 meq/L (ref 98–109)
CO2: 25 mEq/L (ref 22–29)
Creatinine: 1.4 mg/dL — ABNORMAL HIGH (ref 0.6–1.1)
EGFR: 42 mL/min/{1.73_m2} — ABNORMAL LOW (ref >90)
Glucose: 178 mg/dl — ABNORMAL HIGH (ref 70–140)
Potassium: 3.4 mEq/L — ABNORMAL LOW (ref 3.5–5.1)
SODIUM: 140 meq/L (ref 136–145)
Total Protein: 8.2 g/dL (ref 6.4–8.3)

## 2014-07-29 LAB — CBC WITH DIFFERENTIAL/PLATELET
BASO%: 0.9 % (ref 0.0–2.0)
BASOS ABS: 0.1 10*3/uL (ref 0.0–0.1)
EOS%: 1.4 % (ref 0.0–7.0)
Eosinophils Absolute: 0.1 10*3/uL (ref 0.0–0.5)
HEMATOCRIT: 35.4 % (ref 34.8–46.6)
HGB: 11.8 g/dL (ref 11.6–15.9)
LYMPH%: 8.2 % — ABNORMAL LOW (ref 14.0–49.7)
MCH: 31.5 pg (ref 25.1–34.0)
MCHC: 33.4 g/dL (ref 31.5–36.0)
MCV: 94.5 fL (ref 79.5–101.0)
MONO#: 0.8 10*3/uL (ref 0.1–0.9)
MONO%: 8.8 % (ref 0.0–14.0)
NEUT%: 80.7 % — AB (ref 38.4–76.8)
NEUTROS ABS: 7 10*3/uL — AB (ref 1.5–6.5)
Platelets: 246 10*3/uL (ref 145–400)
RBC: 3.74 10*6/uL (ref 3.70–5.45)
RDW: 15.7 % — AB (ref 11.2–14.5)
WBC: 8.6 10*3/uL (ref 3.9–10.3)
lymph#: 0.7 10*3/uL — ABNORMAL LOW (ref 0.9–3.3)

## 2014-07-29 MED ORDER — SODIUM CHLORIDE 0.9 % IV SOLN
250.0000 mL | Freq: Once | INTRAVENOUS | Status: AC
  Administered 2014-07-29: 250 mL via INTRAVENOUS

## 2014-07-29 MED ORDER — SODIUM CHLORIDE 0.9 % IJ SOLN
10.0000 mL | INTRAMUSCULAR | Status: DC | PRN
  Administered 2014-07-29: 10 mL
  Filled 2014-07-29: qty 10

## 2014-07-29 MED ORDER — SODIUM CHLORIDE 0.9 % IJ SOLN
10.0000 mL | INTRAMUSCULAR | Status: DC | PRN
  Administered 2014-07-29: 10 mL via INTRAVENOUS
  Filled 2014-07-29: qty 10

## 2014-07-29 MED ORDER — SODIUM CHLORIDE 0.9 % IV SOLN
3.0000 mg/kg | Freq: Once | INTRAVENOUS | Status: AC
  Administered 2014-07-29: 300 mg via INTRAVENOUS
  Filled 2014-07-29: qty 30

## 2014-07-29 MED ORDER — DEXTROMETHORPHAN-GUAIFENESIN 10-100 MG/5ML PO SYRP: 5.0000 mL | ORAL_SOLUTION | Freq: Two times a day (BID) | ORAL | Status: DC

## 2014-07-29 MED ORDER — HEPARIN SOD (PORK) LOCK FLUSH 100 UNIT/ML IV SOLN
500.0000 [IU] | Freq: Once | INTRAVENOUS | Status: AC | PRN
  Administered 2014-07-29: 500 [IU]
  Filled 2014-07-29: qty 5

## 2014-07-29 NOTE — Telephone Encounter (Signed)
Appointments made and added and patient will get a new avs in chemo

## 2014-07-29 NOTE — Patient Instructions (Signed)
[facility] Discharge Instructions for Patients  Today you received the following: Nivolumab   To help prevent nausea and vomiting after your treatment, we encourage you to take your nausea medication as directed.    If you develop nausea and vomiting that is not controlled by your nausea medication, call the clinic.   BELOW ARE SYMPTOMS THAT SHOULD BE REPORTED IMMEDIATELY:  *FEVER GREATER THAN 100.5 F  *CHILLS WITH OR WITHOUT FEVER  NAUSEA AND VOMITING THAT IS NOT CONTROLLED WITH YOUR NAUSEA MEDICATION  *UNUSUAL SHORTNESS OF BREATH  *UNUSUAL BRUISING OR BLEEDING  TENDERNESS IN MOUTH AND THROAT WITH OR WITHOUT PRESENCE OF ULCERS  *URINARY PROBLEMS  *BOWEL PROBLEMS  UNUSUAL RASH Items with * indicate a potential emergency and should be followed up as soon as possible.  Feel free to call the clinic you have any questions or concerns. The clinic phone number is [phone].  Please show the CHEMO ALERT CARD at check-in to the Emergency Department and triage nurse.   

## 2014-07-29 NOTE — Assessment & Plan Note (Signed)
So far, she tolerated treatment well except for mild fatigue WIll continue the same Per discussion with her radiation oncologist, she will receive palliative therapy to her bone

## 2014-07-29 NOTE — Assessment & Plan Note (Signed)
She has profound fatigue. Her thyroid medication was recently adjusted with good success The best dose for her right now would be 200 g daily and a plan to recheck thyroid function tests in 3 months, due around September 2016

## 2014-07-29 NOTE — Assessment & Plan Note (Signed)
She continues to have intermittent back pain She will proceed with radiation to the cervical spine

## 2014-07-29 NOTE — Assessment & Plan Note (Signed)
Her blood pressure responded well to amlodipine. Continue the same. She is having a little bit more high blood pressure today likely related to anxiety  I will reassess in her next visit

## 2014-07-29 NOTE — Assessment & Plan Note (Signed)
Her creatinine is stable Will continue aggressive blood pressure management

## 2014-07-29 NOTE — Assessment & Plan Note (Signed)
She will continue ranitidine

## 2014-07-29 NOTE — Patient Instructions (Signed)

## 2014-07-29 NOTE — Progress Notes (Signed)
[place] Psychosocial Distress Screening Clinical Social Work  Clinical Social Work was referred by distress screening protocol.  The patient scored a 5 on the Psychosocial Distress Thermometer which indicates moderate distress. Clinical Social Worker attempted to contact patient to assess for distress and other psychosocial needs.   ONCBCN DISTRESS SCREENING 07/21/2014  Screening Type Initial Screening  Distress experienced in past week (1-10) 5  Emotional problem type Nervousness/Anxiety;Boredom  Spiritual/Religous concerns type Relating to God  Physical Problem type Swollen arms/legs  Physician notified of physical symptoms Yes  Referral to clinical social work Yes    Clinical Social Worker follow up needed: Yes.    If yes, follow up plan: CSW left voicemail for patient to return call.  Polo Riley, MSW, LCSW, OSW-C Clinical Social Worker [facility] [phone]

## 2014-07-29 NOTE — Progress Notes (Signed)
[place] OFFICE PROGRESS NOTE  Patient Care Team: Carol Ada, MD as PCP - General (Family Medicine)  SUMMARY OF ONCOLOGIC HISTORY: Oncology History   Adenocarcinoma of upper lobe of right lung   Staging form: Lung, AJCC 6th Edition     Clinical stage from 12/29/2013: Stage IV (T4, N3, M1) - Signed by [name], MD on 12/29/2013       Adenocarcinoma of upper lobe of right lung   12/13/2013 - 12/17/2013 Hospital Admission She was admitted to the hospital for workup of severe pain and neurological deficit and was found to have 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   12/13/2013 Imaging CT scan show large mass in the right lung apex likely representing primary lung carcinoma or large metastasis. Metastases demonstrated in the pretracheal lymph nodes, T1 vertebral body and possibly sacrum.   12/14/2013 Initial Diagnosis Adenocarcinoma of upper lobe of right lung   12/14/2013 Pathology Results Accession: [id number] biopsy from right upper lobe showed adenocarcinoma. Foundation One testing was positive for several mutations without any known treatment options now   12/17/2013 Imaging MRI of the head is negative.   12/23/2013 - 02/05/2014 Radiation Therapy She received radiation therapy to the mediastinal mass and bone   12/28/2013 Imaging PET/CT scan showed mediastinal mass, lymphadenopathy and T1 involvement.   01/01/2014 - 01/29/2014 Chemotherapy Weekly carboplatin and Taxol were added   03/12/2014 Imaging Repeat PET scan showed near complete response to treatment.   03/16/2014 - 05/28/2014 Chemotherapy She started treatment with Alimta for maintenance   07/07/2014 Imaging PET scan showed disease progression   07/15/2014 -  Chemotherapy She received palliative treatment with Opdivo    INTERVAL HISTORY: Please see below for problem oriented charting. She is seen prior to cycle 2 of treatment She is undergoing evaluation and possible cataract surgery in the  near future She is proceeding with divorce procedure with her husband She is not tearful today.  Had minor pain and chronic fatigue She denies side-effects from cycle 1 of chemo  REVIEW OF SYSTEMS:   Constitutional: Denies fevers, chills or abnormal weight loss Eyes: Denies blurriness of vision Ears, nose, mouth, throat, and face: Denies mucositis or sore throat Respiratory: Denies cough, dyspnea or wheezes Cardiovascular: Denies palpitation, chest discomfort or lower extremity swelling Gastrointestinal:  Denies nausea, heartburn or change in bowel habits Skin: Denies abnormal skin rashes Lymphatics: Denies new lymphadenopathy or easy bruising Neurological:Denies numbness, tingling or new weaknesses Behavioral/Psych: Mood is stable, no new changes  All other systems were reviewed with the patient and are negative.  I have reviewed the past medical history, past surgical history, social history and family history with the patient and they are unchanged from previous note.  ALLERGIES:  is allergic to codeine and penicillins.  MEDICATIONS:  Current Outpatient Prescriptions  Medication Sig Dispense Refill  . amLODipine (NORVASC) 10 MG tablet TAKE 1 TABLET BY MOUTH EVERY DAY 30 tablet 0  . levothyroxine (SYNTHROID) 200 MCG tablet Take 1 tablet (200 mcg total) by mouth daily before breakfast. 30 tablet 2  . LORazepam (ATIVAN) 0.5 MG tablet Take 1 tablet (0.5 mg total) by mouth every 8 (eight) hours as needed for anxiety (or nausea). 30 tablet 0  . mirtazapine (REMERON) 15 MG tablet Take 1 tablet (15 mg total) by mouth at bedtime as needed (For sleep.). 30 tablet 3  . ondansetron (ZOFRAN) 8 MG tablet Take 1 tablet (8 mg total) by mouth every 6 (six) hours as needed for  nausea. 60 tablet 0  . prochlorperazine (COMPAZINE) 10 MG tablet Take 1 tablet (10 mg total) by mouth every 6 (six) hours as needed for nausea or vomiting. 30 tablet 1  . ranitidine (ZANTAC) 150 MG capsule Take 1 capsule (150  mg total) by mouth every evening. 30 capsule 6  . Dextromethorphan-Guaifenesin 10-100 MG/5ML liquid Take 5 mLs by mouth every 12 (twelve) hours. 473 mL 0   No current facility-administered medications for this visit.    PHYSICAL EXAMINATION: ECOG PERFORMANCE STATUS: 1 - Symptomatic but completely ambulatory  Filed Vitals:   07/29/14 1103  BP: 179/81  Pulse: 88  Temp: 98.3 F (36.8 C)  Resp: 18   Filed Weights   07/29/14 1103  Weight: 219 lb 4.8 oz (99.474 kg)    GENERAL:alert, no distress and comfortable SKIN: skin color, texture, turgor are normal, no rashes or significant lesions EYES: normal, Conjunctiva are pink and non-injected, sclera clear OROPHARYNX:no exudate, no erythema and lips, buccal mucosa, and tongue normal  NECK: supple, thyroid normal size, non-tender, without nodularity LYMPH:  no palpable lymphadenopathy in the cervical, axillary or inguinal LUNGS: clear to auscultation and percussion with normal breathing effort HEART: regular rate & rhythm and no murmurs and no lower extremity edema ABDOMEN:abdomen soft, non-tender and normal bowel sounds Musculoskeletal:no cyanosis of digits and no clubbing  NEURO: alert & oriented x 3 with fluent speech, no focal motor/sensory deficits  LABORATORY DATA:  I have reviewed the data as listed    Component Value Date/Time   NA 140 07/29/2014 1037   NA 138 06/24/2014 1422   K 3.4* 07/29/2014 1037   K 4.0 06/24/2014 1422   CL 96* 06/24/2014 1422   CO2 25 07/29/2014 1037   CO2 30 06/24/2014 1422   GLUCOSE 178* 07/29/2014 1037   GLUCOSE 151* 06/24/2014 1422   BUN 16.2 07/29/2014 1037   BUN 13 06/24/2014 1422   CREATININE 1.4* 07/29/2014 1037   CREATININE 1.54* 06/24/2014 1422   CALCIUM 10.1 07/29/2014 1037   CALCIUM 9.1 06/24/2014 1422   PROT 8.2 07/29/2014 1037   PROT 7.5 12/13/2013 2018   ALBUMIN 3.6 07/29/2014 1037   ALBUMIN 3.7 12/13/2013 2018   AST 17 07/29/2014 1037   AST 17 12/13/2013 2018   ALT 11  07/29/2014 1037   ALT 14 12/13/2013 2018   ALKPHOS 100 07/29/2014 1037   ALKPHOS 80 12/13/2013 2018   BILITOT 0.53 07/29/2014 1037   BILITOT 0.3 12/13/2013 2018   GFRNONAA 36* 06/24/2014 1422   GFRAA 41* 06/24/2014 1422    No results found for: SPEP, UPEP  Lab Results  Component Value Date   WBC 8.6 07/29/2014   NEUTROABS 7.0* 07/29/2014   HGB 11.8 07/29/2014   HCT 35.4 07/29/2014   MCV 94.5 07/29/2014   PLT 246 07/29/2014      Chemistry      Component Value Date/Time   NA 140 07/29/2014 1037   NA 138 06/24/2014 1422   K 3.4* 07/29/2014 1037   K 4.0 06/24/2014 1422   CL 96* 06/24/2014 1422   CO2 25 07/29/2014 1037   CO2 30 06/24/2014 1422   BUN 16.2 07/29/2014 1037   BUN 13 06/24/2014 1422   CREATININE 1.4* 07/29/2014 1037   CREATININE 1.54* 06/24/2014 1422      Component Value Date/Time   CALCIUM 10.1 07/29/2014 1037   CALCIUM 9.1 06/24/2014 1422   ALKPHOS 100 07/29/2014 1037   ALKPHOS 80 12/13/2013 2018   AST 17 07/29/2014 1037  AST 17 12/13/2013 2018   ALT 11 07/29/2014 1037   ALT 14 12/13/2013 2018   BILITOT 0.53 07/29/2014 1037   BILITOT 0.3 12/13/2013 2018       ASSESSMENT & PLAN:  Adenocarcinoma of upper lobe of right lung So far, she tolerated treatment well except for mild fatigue WIll continue the same Per discussion with her radiation oncologist, she will receive palliative therapy to her bone  Esophagitis She will continue ranitidine  Essential hypertension  Her blood pressure responded well to amlodipine. Continue the same. She is having a little bit more high blood pressure today likely related to anxiety  I will reassess in her next visit     Other fatigue She has profound fatigue. Her thyroid medication was recently adjusted with good success The best dose for her right now would be 200 g daily and a plan to recheck thyroid function tests in 3 months, due around September 2016    Pathologic compression fracture of  spine She continues to have intermittent back pain She will proceed with radiation to the cervical spine   Prerenal renal failure Her creatinine is stable Will continue aggressive blood pressure management   Orders Placed This Encounter  Procedures  . TSH    Standing Status: Future     Number of Occurrences:      Standing Expiration Date: 09/02/2015   All questions were answered. The patient knows to call the clinic with any problems, questions or concerns. No barriers to learning was detected. I spent 25 minutes counseling the patient face to face. The total time spent in the appointment was 30 minutes and more than 50% was on counseling and review of test results     [facility], [place], MD 07/29/2014 11:12 PM

## 2014-07-30 ENCOUNTER — Ambulatory Visit
Admission: RE | Admit: 2014-07-30 | Discharge: 2014-07-30 | Disposition: A | Discharge status: Home / Self Care | Payer: No Typology Code available for payment source | Source: Ambulatory Visit | Attending: Radiation Oncology | Admitting: Radiation Oncology

## 2014-07-30 DIAGNOSIS — Z87891 Personal history of nicotine dependence: Secondary | ICD-10-CM | POA: Diagnosis not present

## 2014-07-30 DIAGNOSIS — C3411 Malignant neoplasm of upper lobe, right bronchus or lung: Secondary | ICD-10-CM

## 2014-08-03 ENCOUNTER — Encounter: Payer: Self-pay | Admitting: Hematology and Oncology

## 2014-08-03 NOTE — Progress Notes (Signed)
I refaxed notes on appeal. See prev notes.

## 2014-08-10 ENCOUNTER — Encounter: Payer: Self-pay | Admitting: Hematology and Oncology

## 2014-08-10 ENCOUNTER — Other Ambulatory Visit: Payer: Self-pay | Admitting: Hematology and Oncology

## 2014-08-10 ENCOUNTER — Ambulatory Visit: Payer: No Typology Code available for payment source | Admitting: Radiation Oncology

## 2014-08-10 ENCOUNTER — Encounter: Payer: Self-pay | Admitting: *Deleted

## 2014-08-10 ENCOUNTER — Telehealth: Payer: Self-pay | Admitting: *Deleted

## 2014-08-10 DIAGNOSIS — C3411 Malignant neoplasm of upper lobe, right bronchus or lung: Secondary | ICD-10-CM | POA: Diagnosis not present

## 2014-08-10 NOTE — Telephone Encounter (Signed)
Pt left VM states her BP continues to "go sky high" then "back down again."  Her ankles are swollen again and she gets winded easily with ADLs.  She asks if Dr. Alvy Bimler thinks she needs to see Cardiologist?

## 2014-08-10 NOTE — Progress Notes (Signed)
Disability questions/form completed and signed by Dr. Alvy Bimler.  Faxed back to [place] at [employer] [phone].

## 2014-08-10 NOTE — Progress Notes (Signed)
I placed unum form on desk of nurse for dr. Alvy Bimler

## 2014-08-11 ENCOUNTER — Ambulatory Visit
Admission: RE | Admit: 2014-08-11 | Discharge: 2014-08-11 | Disposition: A | Discharge status: Home / Self Care | Payer: No Typology Code available for payment source | Source: Ambulatory Visit | Attending: Radiation Oncology | Admitting: Radiation Oncology

## 2014-08-11 ENCOUNTER — Telehealth: Payer: Self-pay | Admitting: *Deleted

## 2014-08-11 ENCOUNTER — Other Ambulatory Visit: Payer: Self-pay | Admitting: Hematology and Oncology

## 2014-08-11 ENCOUNTER — Ambulatory Visit: Payer: No Typology Code available for payment source

## 2014-08-11 ENCOUNTER — Telehealth: Payer: Self-pay | Admitting: Hematology and Oncology

## 2014-08-11 DIAGNOSIS — I10 Essential (primary) hypertension: Secondary | ICD-10-CM

## 2014-08-11 DIAGNOSIS — C3411 Malignant neoplasm of upper lobe, right bronchus or lung: Secondary | ICD-10-CM | POA: Diagnosis not present

## 2014-08-11 DIAGNOSIS — R600 Localized edema: Secondary | ICD-10-CM

## 2014-08-11 NOTE — Telephone Encounter (Signed)
I will place cardiology consult

## 2014-08-11 NOTE — Telephone Encounter (Signed)
s.w. pt and advised on Cardiology appt with Dr. Stanford Breed on 9.2 @ 2:30pm...pt ok and aware...pt is aware that she is on call back list

## 2014-08-11 NOTE — Telephone Encounter (Signed)
Pt already notified about appt

## 2014-08-12 ENCOUNTER — Ambulatory Visit: Payer: No Typology Code available for payment source

## 2014-08-12 ENCOUNTER — Other Ambulatory Visit (HOSPITAL_BASED_OUTPATIENT_CLINIC_OR_DEPARTMENT_OTHER): Payer: No Typology Code available for payment source

## 2014-08-12 ENCOUNTER — Ambulatory Visit (HOSPITAL_BASED_OUTPATIENT_CLINIC_OR_DEPARTMENT_OTHER): Payer: No Typology Code available for payment source

## 2014-08-12 ENCOUNTER — Ambulatory Visit: Admission: RE | Admit: 2014-08-12 | Payer: No Typology Code available for payment source | Source: Ambulatory Visit

## 2014-08-12 VITALS — BP 153/84 | HR 72 | Temp 98.3°F | Resp 16

## 2014-08-12 DIAGNOSIS — Z95828 Presence of other vascular implants and grafts: Secondary | ICD-10-CM

## 2014-08-12 DIAGNOSIS — Z5112 Encounter for antineoplastic immunotherapy: Secondary | ICD-10-CM | POA: Diagnosis not present

## 2014-08-12 DIAGNOSIS — C3411 Malignant neoplasm of upper lobe, right bronchus or lung: Secondary | ICD-10-CM

## 2014-08-12 DIAGNOSIS — Z452 Encounter for adjustment and management of vascular access device: Secondary | ICD-10-CM

## 2014-08-12 LAB — COMPREHENSIVE METABOLIC PANEL (CC13)
ALBUMIN: 3.3 g/dL — AB (ref 3.5–5.0)
ALK PHOS: 93 U/L (ref 40–150)
ALT: 8 U/L (ref 0–55)
AST: 13 U/L (ref 5–34)
Anion Gap: 11 mEq/L (ref 3–11)
BUN: 13.9 mg/dL (ref 7.0–26.0)
CHLORIDE: 104 meq/L (ref 98–109)
CO2: 26 mEq/L (ref 22–29)
Calcium: 9.8 mg/dL (ref 8.4–10.4)
Creatinine: 1.3 mg/dL — ABNORMAL HIGH (ref 0.6–1.1)
EGFR: 44 mL/min/{1.73_m2} — ABNORMAL LOW (ref >90)
Glucose: 160 mg/dl — ABNORMAL HIGH (ref 70–140)
POTASSIUM: 3.6 meq/L (ref 3.5–5.1)
Sodium: 141 mEq/L (ref 136–145)
TOTAL PROTEIN: 8.2 g/dL (ref 6.4–8.3)
Total Bilirubin: 0.29 mg/dL (ref 0.20–1.20)

## 2014-08-12 LAB — CBC WITH DIFFERENTIAL/PLATELET
BASO%: 0.1 % (ref 0.0–2.0)
BASOS ABS: 0 10*3/uL (ref 0.0–0.1)
EOS%: 1.2 % (ref 0.0–7.0)
Eosinophils Absolute: 0.1 10*3/uL (ref 0.0–0.5)
HCT: 34.2 % — ABNORMAL LOW (ref 34.8–46.6)
HGB: 11.2 g/dL — ABNORMAL LOW (ref 11.6–15.9)
LYMPH%: 12.1 % — AB (ref 14.0–49.7)
MCH: 31.5 pg (ref 25.1–34.0)
MCHC: 32.7 g/dL (ref 31.5–36.0)
MCV: 96.1 fL (ref 79.5–101.0)
MONO#: 1 10*3/uL — AB (ref 0.1–0.9)
MONO%: 10.6 % (ref 0.0–14.0)
NEUT#: 6.9 10*3/uL — ABNORMAL HIGH (ref 1.5–6.5)
NEUT%: 76 % (ref 38.4–76.8)
Platelets: 254 10*3/uL (ref 145–400)
RBC: 3.56 10*6/uL — ABNORMAL LOW (ref 3.70–5.45)
RDW: 14.3 % (ref 11.2–14.5)
WBC: 9 10*3/uL (ref 3.9–10.3)
lymph#: 1.1 10*3/uL (ref 0.9–3.3)

## 2014-08-12 MED ORDER — SODIUM CHLORIDE 0.9 % IV SOLN
Freq: Once | INTRAVENOUS | Status: AC
  Administered 2014-08-12: 13:00:00 via INTRAVENOUS

## 2014-08-12 MED ORDER — HEPARIN SOD (PORK) LOCK FLUSH 100 UNIT/ML IV SOLN
500.0000 [IU] | Freq: Once | INTRAVENOUS | Status: AC | PRN
  Administered 2014-08-12: 500 [IU]
  Filled 2014-08-12: qty 5

## 2014-08-12 MED ORDER — SODIUM CHLORIDE 0.9 % IJ SOLN
10.0000 mL | INTRAMUSCULAR | Status: DC | PRN
  Administered 2014-08-12: 10 mL
  Filled 2014-08-12: qty 10

## 2014-08-12 MED ORDER — SODIUM CHLORIDE 0.9 % IV SOLN
3.0000 mg/kg | Freq: Once | INTRAVENOUS | Status: AC
  Administered 2014-08-12: 300 mg via INTRAVENOUS
  Filled 2014-08-12: qty 30

## 2014-08-12 MED ORDER — ALTEPLASE 2 MG IJ SOLR
2.0000 mg | Freq: Once | INTRAMUSCULAR | Status: AC | PRN
  Administered 2014-08-12: 2 mg
  Filled 2014-08-12: qty 2

## 2014-08-12 MED ORDER — SODIUM CHLORIDE 0.9 % IJ SOLN
10.0000 mL | INTRAMUSCULAR | Status: DC | PRN
  Administered 2014-08-12: 10 mL via INTRAVENOUS
  Filled 2014-08-12: qty 10

## 2014-08-12 NOTE — Patient Instructions (Signed)
Cancer Center Discharge Instructions for Patients  Today you received the following: Nivolumab   To help prevent nausea and vomiting after your treatment, we encourage you to take your nausea medication as directed.    If you develop nausea and vomiting that is not controlled by your nausea medication, call the clinic.   BELOW ARE SYMPTOMS THAT SHOULD BE REPORTED IMMEDIATELY:  *FEVER GREATER THAN 100.5 F  *CHILLS WITH OR WITHOUT FEVER  NAUSEA AND VOMITING THAT IS NOT CONTROLLED WITH YOUR NAUSEA MEDICATION  *UNUSUAL SHORTNESS OF BREATH  *UNUSUAL BRUISING OR BLEEDING  TENDERNESS IN MOUTH AND THROAT WITH OR WITHOUT PRESENCE OF ULCERS  *URINARY PROBLEMS  *BOWEL PROBLEMS  UNUSUAL RASH Items with * indicate a potential emergency and should be followed up as soon as possible.  Feel free to call the clinic you have any questions or concerns. The clinic phone number is [phone].  Please show the CHEMO ALERT CARD at check-in to the Emergency Department and triage nurse.   

## 2014-08-12 NOTE — Progress Notes (Signed)
Patient in for labs and [facility] access for Infusion appointment today. PAC accessed and flushed without any difficulty. Only 2 mL of blood return noted with flush, not enough for labs obtained. Patient denies any pain or discomfort during or after flush. PAC covered with Tegaderm dressing. Patient agreed to have all labs drawn by the Phlebotomist today. All labs drawn by Rachel Moulds, Phlebotomist. Thane Edu, RN Infusion Charge Nurse made aware of 2 mL of blood return from [facility]. Patient discharged from the Flush Room without any complaints.

## 2014-08-12 NOTE — Progress Notes (Signed)
PAC still does not have blood return.  TPA remains instilled.  12:55 Patient to RT escorted by tech, Vivien Rota. 1320.  Patient returned, unable to to RT without cutting mask.   Patient has excellent blood return.  10cc of serum/blood reurned and wasted.  Connected to IVF

## 2014-08-12 NOTE — Patient Instructions (Signed)

## 2014-08-13 ENCOUNTER — Ambulatory Visit
Admission: RE | Admit: 2014-08-13 | Discharge: 2014-08-13 | Disposition: A | Discharge status: Home / Self Care | Payer: No Typology Code available for payment source | Source: Ambulatory Visit | Attending: Radiation Oncology | Admitting: Radiation Oncology

## 2014-08-13 VITALS — BP 151/66 | HR 84 | Temp 98.7°F | Resp 20 | Wt 220.0 lb

## 2014-08-13 DIAGNOSIS — C3411 Malignant neoplasm of upper lobe, right bronchus or lung: Secondary | ICD-10-CM | POA: Diagnosis not present

## 2014-08-13 NOTE — Progress Notes (Signed)
   Department of Radiation Oncology  Phone:  [phone] Fax:        [phone]  Weekly Treatment Note    Name: Jasmine Grant Date: 08/13/2014 MRN: [id number] DOB: April 15, 1955   Current dose: 6 Gy  Current fraction:2   MEDICATIONS: Current Outpatient Prescriptions  Medication Sig Dispense Refill  . amLODipine (NORVASC) 10 MG tablet TAKE 1 TABLET BY MOUTH EVERY DAY 30 tablet 0  . Dextromethorphan-Guaifenesin 10-100 MG/5ML liquid Take 5 mLs by mouth every 12 (twelve) hours. 473 mL 0  . levothyroxine (SYNTHROID) 200 MCG tablet Take 1 tablet (200 mcg total) by mouth daily before breakfast. 30 tablet 2  . LORazepam (ATIVAN) 0.5 MG tablet Take 1 tablet (0.5 mg total) by mouth every 8 (eight) hours as needed for anxiety (or nausea). 30 tablet 0  . mirtazapine (REMERON) 15 MG tablet Take 1 tablet (15 mg total) by mouth at bedtime as needed (For sleep.). 30 tablet 3  . ondansetron (ZOFRAN) 8 MG tablet Take 1 tablet (8 mg total) by mouth every 6 (six) hours as needed for nausea. 60 tablet 0  . prochlorperazine (COMPAZINE) 10 MG tablet Take 1 tablet (10 mg total) by mouth every 6 (six) hours as needed for nausea or vomiting. 30 tablet 1  . ranitidine (ZANTAC) 150 MG capsule Take 1 capsule (150 mg total) by mouth every evening. 30 capsule 6   No current facility-administered medications for this encounter.     ALLERGIES: Codeine and Penicillins   LABORATORY DATA:  Lab Results  Component Value Date   WBC 9.0 08/12/2014   HGB 11.2* 08/12/2014   HCT 34.2* 08/12/2014   MCV 96.1 08/12/2014   PLT 254 08/12/2014   Lab Results  Component Value Date   NA 141 08/12/2014   K 3.6 08/12/2014   CL 96* 06/24/2014   CO2 26 08/12/2014   Lab Results  Component Value Date   ALT 8 08/12/2014   AST 13 08/12/2014   ALKPHOS 93 08/12/2014   BILITOT 0.29 08/12/2014     NARRATIVE: Jasmine Grant was seen today for weekly treatment management. The chart was checked and the patient's films  were reviewed. Denies pain but does have shortness of breath and high blood pressure on exertion. Scheduled to ss cardiologist in September. Informed to continue application of biafine on previous treatment field as still discolored. Some fatigue. She has experienced some neck spasms, taking diazepam to alleviate it.  PHYSICAL EXAMINATION: vitals were not taken for this visit.       ASSESSMENT: The patient is doing satisfactorily with treatment.  PLAN: We will continue with the patient's radiation treatment as planned.   This document serves as a record of services personally performed by Kyung Rudd, MD. It was created on his behalf by Arlyce Harman, a trained medical scribe. The creation of this record is based on the scribe's personal observations and the provider's statements to them. This document has been checked and approved by the attending provider. ------------------------------------------------  Jodelle Gross, MD, PhD

## 2014-08-13 NOTE — Progress Notes (Signed)
Reviewed clinic routine.Completed 2 of 10 treatments.Denies pain but does have shortness of breath on exertion.Scheduled to ss cardiologist in September.Informed to continue application of biafine on previous treatment field as still discolored.

## 2014-08-14 NOTE — Progress Notes (Addendum)
  Radiation Oncology         [phone] ________________________________  Name: Jasmine Grant MRN: [id number]  Date: 07/30/2014  DOB: 23-Oct-1955  SIMULATION AND TREATMENT PLANNING NOTE  DIAGNOSIS:  Metastatic lung cancer  Site:  Cervical spine  NARRATIVE:  The patient was brought to the [place] suite.  Identity was confirmed.  All relevant records and images related to the planned course of therapy were reviewed.   Written consent to proceed with treatment was confirmed which was freely given after reviewing the details related to the planned course of therapy had been reviewed with the patient.  Then, the patient was set-up in a stable reproducible  supine position for radiation therapy.  CT images were obtained.  Surface markings were placed.    Medically necessary complex treatment device(s) for immobilization:  Customized thermoplastic head chest.   The CT images were loaded into the planning software.  Then the target and avoidance structures were contoured.  Treatment planning then occurred.  The radiation prescription was entered and confirmed.  A total of 2 complex treatment devices were fabricated which relate to the designed radiation treatment fields. Each of these customized fields/ complex treatment devices will be used on a daily basis during the radiation course. I have requested : Isodose Plan.   PLAN:  The patient will receive 30 Gy in 10 fractions.   Special treatment procedure The patient has received radiation treatment to the low neck. This therefore represents a course of re-irradiation to this area. The patient's original treatment will have to be carefully considered and accounted for for the current course of treatment. This will require extra effort and this in part will involve a fusion of the prior treatment with the current plan treatment. This therefore represents a special treatment procedure.  ________________________________   Jodelle Gross,  MD, PhD

## 2014-08-14 NOTE — Addendum Note (Signed)
Encounter addended by: Kyung Rudd, MD on: 08/14/2014 11:04 AM<BR>     Documentation filed: Notes Section

## 2014-08-14 NOTE — Addendum Note (Signed)
Encounter addended by: Kyung Rudd, MD on: 08/14/2014 11:03 AM<BR>     Documentation filed: Visit Diagnoses, Notes Section

## 2014-08-16 ENCOUNTER — Ambulatory Visit
Admission: RE | Admit: 2014-08-16 | Discharge: 2014-08-16 | Disposition: A | Discharge status: Home / Self Care | Payer: No Typology Code available for payment source | Source: Ambulatory Visit | Attending: Radiation Oncology | Admitting: Radiation Oncology

## 2014-08-16 DIAGNOSIS — C3411 Malignant neoplasm of upper lobe, right bronchus or lung: Secondary | ICD-10-CM | POA: Diagnosis not present

## 2014-08-17 ENCOUNTER — Ambulatory Visit
Admission: RE | Admit: 2014-08-17 | Discharge: 2014-08-17 | Disposition: A | Discharge status: Home / Self Care | Payer: No Typology Code available for payment source | Source: Ambulatory Visit | Attending: Radiation Oncology | Admitting: Radiation Oncology

## 2014-08-17 DIAGNOSIS — C3411 Malignant neoplasm of upper lobe, right bronchus or lung: Secondary | ICD-10-CM | POA: Diagnosis not present

## 2014-08-18 ENCOUNTER — Ambulatory Visit
Admission: RE | Admit: 2014-08-18 | Discharge: 2014-08-18 | Disposition: A | Discharge status: Home / Self Care | Payer: No Typology Code available for payment source | Source: Ambulatory Visit | Attending: Radiation Oncology | Admitting: Radiation Oncology

## 2014-08-18 DIAGNOSIS — C3411 Malignant neoplasm of upper lobe, right bronchus or lung: Secondary | ICD-10-CM | POA: Diagnosis not present

## 2014-08-19 ENCOUNTER — Ambulatory Visit
Admission: RE | Admit: 2014-08-19 | Discharge: 2014-08-19 | Disposition: A | Discharge status: Home / Self Care | Payer: No Typology Code available for payment source | Source: Ambulatory Visit | Attending: Radiation Oncology | Admitting: Radiation Oncology

## 2014-08-19 DIAGNOSIS — C3411 Malignant neoplasm of upper lobe, right bronchus or lung: Secondary | ICD-10-CM | POA: Diagnosis not present

## 2014-08-20 ENCOUNTER — Telehealth: Payer: Self-pay | Admitting: *Deleted

## 2014-08-20 ENCOUNTER — Ambulatory Visit
Admission: RE | Admit: 2014-08-20 | Discharge: 2014-08-20 | Disposition: A | Discharge status: Home / Self Care | Payer: No Typology Code available for payment source | Source: Ambulatory Visit | Attending: Radiation Oncology | Admitting: Radiation Oncology

## 2014-08-20 ENCOUNTER — Ambulatory Visit (HOSPITAL_COMMUNITY)
Admission: RE | Admit: 2014-08-20 | Discharge: 2014-08-20 | Disposition: A | Discharge status: Home / Self Care | Payer: No Typology Code available for payment source | Source: Ambulatory Visit | Attending: Nurse Practitioner | Admitting: Nurse Practitioner

## 2014-08-20 ENCOUNTER — Other Ambulatory Visit: Payer: Self-pay | Admitting: *Deleted

## 2014-08-20 ENCOUNTER — Ambulatory Visit (HOSPITAL_BASED_OUTPATIENT_CLINIC_OR_DEPARTMENT_OTHER): Payer: No Typology Code available for payment source | Admitting: Nurse Practitioner

## 2014-08-20 ENCOUNTER — Encounter: Payer: Self-pay | Admitting: Radiation Oncology

## 2014-08-20 ENCOUNTER — Encounter: Payer: Self-pay | Admitting: Hematology and Oncology

## 2014-08-20 VITALS — BP 159/75 | HR 73 | Temp 98.5°F | Resp 22 | Ht 76.0 in | Wt 218.1 lb

## 2014-08-20 VITALS — BP 155/77 | HR 78 | Temp 97.7°F | Resp 20 | Wt 219.4 lb

## 2014-08-20 DIAGNOSIS — Z85118 Personal history of other malignant neoplasm of bronchus and lung: Secondary | ICD-10-CM | POA: Insufficient documentation

## 2014-08-20 DIAGNOSIS — C3411 Malignant neoplasm of upper lobe, right bronchus or lung: Secondary | ICD-10-CM

## 2014-08-20 DIAGNOSIS — R0602 Shortness of breath: Secondary | ICD-10-CM | POA: Diagnosis not present

## 2014-08-20 DIAGNOSIS — R918 Other nonspecific abnormal finding of lung field: Secondary | ICD-10-CM | POA: Diagnosis not present

## 2014-08-20 DIAGNOSIS — R05 Cough: Secondary | ICD-10-CM | POA: Diagnosis present

## 2014-08-20 DIAGNOSIS — J40 Bronchitis, not specified as acute or chronic: Secondary | ICD-10-CM | POA: Diagnosis not present

## 2014-08-20 DIAGNOSIS — Z9221 Personal history of antineoplastic chemotherapy: Secondary | ICD-10-CM | POA: Insufficient documentation

## 2014-08-20 DIAGNOSIS — J189 Pneumonia, unspecified organism: Secondary | ICD-10-CM

## 2014-08-20 DIAGNOSIS — Z923 Personal history of irradiation: Secondary | ICD-10-CM | POA: Diagnosis not present

## 2014-08-20 MED ORDER — LEVOFLOXACIN 500 MG PO TABS: 500.0000 mg | ORAL_TABLET | Freq: Every day | ORAL | Status: DC

## 2014-08-20 NOTE — Progress Notes (Signed)
Per [employer] oxycontin has been approved 08/20/14-08/20/15  Case# [id number]. I will send to medical records.

## 2014-08-20 NOTE — Progress Notes (Signed)
  Radiation Oncology         [phone]   Name: Jasmine Grant MRN: [id number]   Date: 08/20/2014  DOB: 1955-09-04     Weekly Radiation Therapy Management    ICD-9-CM ICD-10-CM   1. Adenocarcinoma of upper lobe of right lung 162.3 C34.11 levofloxacin (LEVAQUIN) 500 MG tablet  2. Pneumonia of lower lobe of lung, unspecified laterality 486 J18.9 levofloxacin (LEVAQUIN) 500 MG tablet    Current Dose: 21 Gy  Planned Dose:  30 Gy  Narrative The patient presents for routine under treatment assessment. Patient seen by Selena Lesser, NP before coming down to radiatin stated yellowish phelgm, had fever at home 100.8, at Cyndee's office 97.8, CXR done perr Cyndee stated to show results to Dr. Tammi Klippel And if she needs to be on antibiotics not ordered by Dr. Cheron Schaumann to send her back up to [facility] bacon NP office.  Weekly rad txs 7/10 C3=C5 completed, coughing up yellow phlegm, mucin ex dm not helping, had chills, dry heaves nausea last night, temp went up to 100.8, now 98.5, appetite fair, fatigued, mucinex and cough syrup have not helped, sob with exertion, had cxr today, per Selena Lesser Np upstairs med/onc symptom management was seen and would like Dr. Tammi Klippel to look at the cxr. Ex smoker, quit 5-6 years ago. Denies having issues with bronchitis. She is allergic to penicillin.   The patient is without complaint. Set-up films were reviewed. The chart was checked.  Physical Findings  height is '6\' 4"'$  (1.93 m) and weight is 218 lb 1.6 oz (98.93 kg). Her oral temperature is 98.5 F (36.9 C). Her blood pressure is 159/75 and her pulse is 73. Her respiration is 22 and oxygen saturation is 98%. . Weight essentially stable.  No significant changes.  Impression The patient is tolerating radiation.  Plan Continue treatment as planned. Prescribed 500 mg levaquin daily for 10 days for the chest congestion. She will take robitussin, as well. Advised to stay hydrated while taking these medications.   This document  serves as a record of services personally performed by Tyler Pita, MD. It was created on his behalf by Arlyce Harman, a trained medical scribe. The creation of this record is based on the scribe's personal observations and the provider's statements to them. This document has been checked and approved by the attending provider.      Sheral Apley Tammi Klippel, M.D.

## 2014-08-20 NOTE — Telephone Encounter (Signed)
Pt called and left message about having chills last night.  Spoke with pt and was informed that pt had chills last night with temp 99.4 and 100.8; has been coughing up yellow and greenish sputum, chest congestion and feels like " My chest is on fire ", has sore throat, and can hear herself wheezing.  Temp this am was 98.2 . Instructed pt to come in now for labs and to see Jenny Reichmann, NP symptom management for further evaluation.  Pt voiced understanding. Pt's  Phone    [phone].

## 2014-08-20 NOTE — Progress Notes (Signed)
Weekly rad txs 7/10 C3=C5 completed, coughing up yellow phlegm, mucin ex dm not helping, had chills, dry heaves nausea last night, temp went up to 100.8, now 98.5, appetite fair, fatigued, ,sob with exertion, had cxr today, per Selena Lesser  Np upstairs med/onc symptom management  Was seen  And would like Dr. Tammi Klippel to look at the cxr 2:13 PM BP 159/75 mmHg  Pulse 73  Temp(Src) 98.5 F (36.9 C) (Oral)  Resp 22  Ht '6\' 4"'$  (1.93 m)  Wt 218 lb 1.6 oz (98.93 kg)  BMI 26.56 kg/m2  SpO2 98%  Wt Readings from Last 3 Encounters:  08/20/14 219 lb 6.4 oz (99.519 kg)  08/20/14 218 lb 1.6 oz (98.93 kg)  08/13/14 220 lb (99.791 kg)

## 2014-08-20 NOTE — Telephone Encounter (Signed)
eror

## 2014-08-20 NOTE — Progress Notes (Addendum)
Patient seen by Selena Lesser, NP  before coming down to radiatin stated yellowish phelgm, had fever at home 100.8, at [facility]'s office 97.8, CXR done perr Cyndee stated to show results to Dr. Tammi Klippel  And if she needs to be on antibiotics not ordered by Dr. Cheron Schaumann to send her back up to [facility] bacon NP office  1:51 PM

## 2014-08-21 ENCOUNTER — Encounter: Payer: Self-pay | Admitting: Nurse Practitioner

## 2014-08-21 DIAGNOSIS — J40 Bronchitis, not specified as acute or chronic: Secondary | ICD-10-CM | POA: Insufficient documentation

## 2014-08-21 NOTE — Progress Notes (Signed)
SYMPTOM MANAGEMENT CLINIC   HPI: Jasmine Grant 59 y.o. female diagnosed with lung cancer with bone metastasis.  Currently undergoing Nivoumab immunotherapy and radiation therapy.   Patient reports a progressive, productive cough of bright yellow secretions; as well as fever to maximum 100.8 in some chills for the past 1-2 weeks.  She has been taking Mucinex over-the-counter with minimal effectiveness.  HPI  ROS  Past Medical History  Diagnosis Date  . Panic attack   . Hx of thyroid cancer     10 years ago  . Insomnia 12/29/2013  . S/P radiation therapy completed 12/16/13-02/05/14    RULL lung  . 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 Arthritis 04/27/2014  . Essential hypertension 04/27/2014    Past Surgical History  Procedure Laterality Date  . Thyroidectomy      has Lung mass; Epidural mass; SVC syndrome; Pathologic compression fracture of spine; Generalized anxiety disorder; Hypothyroidism; Overweight (BMI 25.0-29.9); Elevated blood pressure; Nausea with vomiting; Adenocarcinoma of upper lobe of right lung; Constipation due to opioid therapy; Insomnia; Hypersensitivity reaction; Esophagitis; Leukopenia due to antineoplastic chemotherapy; Radiation-induced dermatitis; Cyst of skin; Other fatigue; Anemia in neoplastic disease; Cellulitis; Arthritis; Bilateral leg edema; Sinusitis, chronic; Essential hypertension; Other constipation; Prerenal renal failure; Right shoulder pain; and Bronchitis on her problem list.    is allergic to codeine; penicillins; and sulfa antibiotics.    Medication List       This list is accurate as of: 08/20/14 11:59 PM.  Always use your most recent med list.               amLODipine 10 MG tablet  Commonly known as:  NORVASC  TAKE 1 TABLET BY MOUTH EVERY DAY     BESIVANCE 0.6 % Susp  Generic drug:  Besifloxacin HCl  Place 1 drop into the right eye 3 (three) times daily.     levofloxacin 500  MG tablet  Commonly known as:  LEVAQUIN  Take 1 tablet (500 mg total) by mouth daily.     levothyroxine 200 MCG tablet  Commonly known as:  SYNTHROID  Take 1 tablet (200 mcg total) by mouth daily before breakfast.     LORazepam 0.5 MG tablet  Commonly known as:  ATIVAN  Take 1 tablet (0.5 mg total) by mouth every 8 (eight) hours as needed for anxiety (or nausea).     mirtazapine 15 MG tablet  Commonly known as:  REMERON  Take 1 tablet (15 mg total) by mouth at bedtime as needed (For sleep.).     ondansetron 8 MG tablet  Commonly known as:  ZOFRAN  Take 1 tablet (8 mg total) by mouth every 6 (six) hours as needed for nausea.     prochlorperazine 10 MG tablet  Commonly known as:  COMPAZINE  Take 1 tablet (10 mg total) by mouth every 6 (six) hours as needed for nausea or vomiting.     ranitidine 150 MG capsule  Commonly known as:  ZANTAC  Take 1 capsule (150 mg total) by mouth every evening.         PHYSICAL EXAMINATION  Oncology Vitals 08/20/2014 08/20/2014 08/13/2014 08/12/2014 08/12/2014 07/29/2014 07/21/2014  Height 193 cm - - - - 191 cm 188 cm  Weight 98.93 kg 99.519 kg 99.791 kg - - 99.474 kg 100.018 kg  Weight (lbs) 218 lbs 2 oz 219 lbs 6 oz 220 lbs - - 219 lbs 5 oz 220  lbs 8 oz  BMI (kg/m2) 26.55 kg/m2 - - - - 27.41 kg/m2 28.31 kg/m2  Temp 98.5 97.7 98.7 - 98.3 98.3 98.7  Pulse 73 78 84 72 78 88 87  Resp 22 20 20  - 16 18 20   SpO2 98 97 99 - 99 98 99  BSA (m2) 2.3 m2 - - - - 2.29 m2 2.28 m2   BP Readings from Last 3 Encounters:  08/20/14 155/77  08/20/14 159/75  08/13/14 151/66    Physical Exam  Constitutional: She is oriented to person, place, and time and well-developed, well-nourished, and in no distress.  HENT:  Head: Normocephalic and atraumatic.  Nose: Nose normal.  Mouth/Throat: Oropharynx is clear and moist.  Eyes: Conjunctivae and EOM are normal. Pupils are equal, round, and reactive to light. Right eye exhibits no discharge. Left eye exhibits no  discharge. No scleral icterus.  Neck: Normal range of motion. Neck supple. No JVD present. No tracheal deviation present. No thyromegaly present.  Cardiovascular: Normal rate, regular rhythm, normal heart sounds and intact distal pulses.   Pulmonary/Chest: Effort normal. No stridor. No respiratory distress. She has no wheezes. She has rales. She exhibits no tenderness.  Abdominal: Soft. Bowel sounds are normal. She exhibits no distension and no mass. There is no tenderness. There is no rebound and no guarding.  Musculoskeletal: Normal range of motion. She exhibits no edema or tenderness.  Lymphadenopathy:    She has no cervical adenopathy.  Neurological: She is alert and oriented to person, place, and time. Gait normal.  Skin: Skin is warm and dry. No rash noted. No erythema. No pallor.  Psychiatric: Affect normal.  Nursing note and vitals reviewed.   LABORATORY DATA:. No visits with results within 3 Day(s) from this visit. Latest known visit with results is:  Appointment on 08/12/2014  Component Date Value Ref Range Status  . WBC 08/12/2014 9.0  3.9 - 10.3 10e3/uL Final  . NEUT# 08/12/2014 6.9* 1.5 - 6.5 10e3/uL Final  . HGB 08/12/2014 11.2* 11.6 - 15.9 g/dL Final  . HCT 08/12/2014 34.2* 34.8 - 46.6 % Final  . Platelets 08/12/2014 254  145 - 400 10e3/uL Final  . MCV 08/12/2014 96.1  79.5 - 101.0 fL Final  . MCH 08/12/2014 31.5  25.1 - 34.0 pg Final  . MCHC 08/12/2014 32.7  31.5 - 36.0 g/dL Final  . RBC 08/12/2014 3.56* 3.70 - 5.45 10e6/uL Final  . RDW 08/12/2014 14.3  11.2 - 14.5 % Final  . lymph# 08/12/2014 1.1  0.9 - 3.3 10e3/uL Final  . MONO# 08/12/2014 1.0* 0.1 - 0.9 10e3/uL Final  . Eosinophils Absolute 08/12/2014 0.1  0.0 - 0.5 10e3/uL Final  . Basophils Absolute 08/12/2014 0.0  0.0 - 0.1 10e3/uL Final  . NEUT% 08/12/2014 76.0  38.4 - 76.8 % Final  . LYMPH% 08/12/2014 12.1* 14.0 - 49.7 % Final  . MONO% 08/12/2014 10.6  0.0 - 14.0 % Final  . EOS% 08/12/2014 1.2  0.0 - 7.0 %  Final  . BASO% 08/12/2014 0.1  0.0 - 2.0 % Final  . Sodium 08/12/2014 141  136 - 145 mEq/L Final  . Potassium 08/12/2014 3.6  3.5 - 5.1 mEq/L Final  . Chloride 08/12/2014 104  98 - 109 mEq/L Final  . CO2 08/12/2014 26  22 - 29 mEq/L Final  . Glucose 08/12/2014 160* 70 - 140 mg/dl Final  . BUN 08/12/2014 13.9  7.0 - 26.0 mg/dL Final  . Creatinine 08/12/2014 1.3* 0.6 - 1.1 mg/dL Final  .  Total Bilirubin 08/12/2014 0.29  0.20 - 1.20 mg/dL Final  . Alkaline Phosphatase 08/12/2014 93  40 - 150 U/L Final  . AST 08/12/2014 13  5 - 34 U/L Final  . ALT 08/12/2014 8  0 - 55 U/L Final  . Total Protein 08/12/2014 8.2  6.4 - 8.3 g/dL Final  . Albumin 08/12/2014 3.3* 3.5 - 5.0 g/dL Final  . Calcium 08/12/2014 9.8  8.4 - 10.4 mg/dL Final  . Anion Gap 08/12/2014 11  3 - 11 mEq/L Final  . EGFR 08/12/2014 44* >90 ml/min/1.73 m2 Final   eGFR is calculated using the CKD-EPI Creatinine Equation (2009)     RADIOGRAPHIC STUDIES: Dg Chest 2 View  08/20/2014   CLINICAL DATA:  Productive cough, shortness of breath with physical activity. Diagnosed with lung cancer in November 2015 currently receiving chemotherapy and radiation therapy.  EXAM: CHEST  2 VIEW  COMPARISON:  Chest x-rays dated 06/15/2014 and 02/25/2011.  FINDINGS: Cardiomediastinal silhouette remains normal in size and configuration. Right chest wall Port-A-Cath is stable in position with tip in the SVC.  The ill-defined airspace opacity within the right upper lobe is not significantly changed compared to the chest x-ray of 06/15/2014, suggesting radiation change.  A new nodular opacity is seen overlying the left heart border that likely corresponds to the hypermetabolic pulmonary nodule identified on PET-CT of 07/07/2014. Suspect some additional chronic scarring/ atelectasis at the more lateral aspects of the left lung base. Lungs otherwise unremarkable. No pleural effusion.  IMPRESSION: 1. Ill-defined airspace opacity within the right upper lobe is not  significantly changed compared to the most recent prior chest x-ray and is most likely sequela of radiation therapy. This correlates with findings of recent PET-CT as well. 2. New nodular opacity within the left lower lobe which likely corresponds to a hypermetabolic pulmonary nodule identified on recent PET-CT. Superimposed small/early left lower lobe pneumonia would be difficult to exclude if patient were febrile. 3. Heart size remains normal.   Electronically Signed   By: Franki Cabot M.D.   On: 08/20/2014 12:58    ASSESSMENT/PLAN:    Adenocarcinoma of upper lobe of right lung Patient received her last cycle of Nivolumab immunotherapy on 08/14/2014.  She has 4 days left of her palliative radiation.  Patient has plans to return on 08/26/2014 for labs, visit, and her next cycle of immunotherapy.  Bronchitis Patient reports a progressive, productive cough of bright yellow secretions; as well as fever to maximum 100.8 in some chills for the past 1-2 weeks.  She has been taking Mucinex over-the-counter with minimal effectiveness.  On exam.-Patient does have some mild rhonchi bilaterally; but no wheeze or congested cough noted.  No acute respiratory distress noted.  Patient was afebrile while at the cancer center.  Chest x-ray obtained this afternoon revealed questionable new nodular opacity to the left lower lobe; as well as possible early left lower lobe pneumonia.  Since patient was already scheduled for radiation oncology follow-up exam prior to her radiation treatment this afternoon.-Had Dr. Tammi Klippel, review chest x-ray results as well.  Received a call from radiation oncology reporting that patient has been prescribed Levaquin antibiotics for bronchitis/questionable pneumonia symptoms.  Patient was advised to call/return of her directly to the emergency department for any worsening symptoms whatsoever.  Patient stated understanding of all instructions; and was in agreement with this plan  of care. The patient knows to call the clinic with any problems, questions or concerns.   Review/collaboration with Dr. Tammi Klippel regarding all aspects  of patient's visit today.   Total time spent with patient was 40 minutes;  with greater than  75 percent of that time spent in face to face counseling regarding patient's symptoms,  and coordination of care and follow up.  Disclaimer: This note was dictated with voice recognition software. Similar sounding words can inadvertently be transcribed and may not be corrected upon review.   Drue Second, NP 08/21/2014

## 2014-08-21 NOTE — Assessment & Plan Note (Signed)
Patient received her last cycle of Nivolumab immunotherapy on 08/14/2014.  She has 4 days left of her palliative radiation.  Patient has plans to return on 08/26/2014 for labs, visit, and her next cycle of immunotherapy.

## 2014-08-21 NOTE — Assessment & Plan Note (Signed)
Patient reports a progressive, productive cough of bright yellow secretions; as well as fever to maximum 100.8 in some chills for the past 1-2 weeks.  She has been taking Mucinex over-the-counter with minimal effectiveness.  On exam.-Patient does have some mild rhonchi bilaterally; but no wheeze or congested cough noted.  No acute respiratory distress noted.  Patient was afebrile while at the cancer center.  Chest x-ray obtained this afternoon revealed questionable new nodular opacity to the left lower lobe; as well as possible early left lower lobe pneumonia.  Since patient was already scheduled for radiation oncology follow-up exam prior to her radiation treatment this afternoon.-Had Dr. Tammi Klippel, review chest x-ray results as well.  Received a call from radiation oncology reporting that patient has been prescribed Levaquin antibiotics for bronchitis/questionable pneumonia symptoms.  Patient was advised to call/return of her directly to the emergency department for any worsening symptoms whatsoever.

## 2014-08-22 ENCOUNTER — Ambulatory Visit: Payer: No Typology Code available for payment source

## 2014-08-23 ENCOUNTER — Ambulatory Visit
Admission: RE | Admit: 2014-08-23 | Discharge: 2014-08-23 | Disposition: A | Discharge status: Home / Self Care | Payer: No Typology Code available for payment source | Source: Ambulatory Visit | Attending: Radiation Oncology | Admitting: Radiation Oncology

## 2014-08-23 ENCOUNTER — Telehealth: Payer: Self-pay

## 2014-08-23 ENCOUNTER — Ambulatory Visit: Payer: No Typology Code available for payment source

## 2014-08-23 DIAGNOSIS — C3411 Malignant neoplasm of upper lobe, right bronchus or lung: Secondary | ICD-10-CM | POA: Diagnosis not present

## 2014-08-23 NOTE — Telephone Encounter (Signed)
S/w pt, she is breathing a little better, she is not coughing up as much green sputum. She stated she is going to continue and finish the levaquin Rx.

## 2014-08-24 ENCOUNTER — Ambulatory Visit
Admission: RE | Admit: 2014-08-24 | Discharge: 2014-08-24 | Disposition: A | Discharge status: Home / Self Care | Payer: No Typology Code available for payment source | Source: Ambulatory Visit | Attending: Radiation Oncology | Admitting: Radiation Oncology

## 2014-08-24 ENCOUNTER — Ambulatory Visit: Payer: No Typology Code available for payment source

## 2014-08-24 DIAGNOSIS — C3411 Malignant neoplasm of upper lobe, right bronchus or lung: Secondary | ICD-10-CM | POA: Diagnosis not present

## 2014-08-25 ENCOUNTER — Encounter: Payer: Self-pay | Admitting: Radiation Oncology

## 2014-08-25 ENCOUNTER — Ambulatory Visit
Admission: RE | Admit: 2014-08-25 | Discharge: 2014-08-25 | Disposition: A | Discharge status: Home / Self Care | Payer: No Typology Code available for payment source | Source: Ambulatory Visit | Attending: Radiation Oncology | Admitting: Radiation Oncology

## 2014-08-25 DIAGNOSIS — C3411 Malignant neoplasm of upper lobe, right bronchus or lung: Secondary | ICD-10-CM | POA: Diagnosis not present

## 2014-08-26 ENCOUNTER — Other Ambulatory Visit (HOSPITAL_BASED_OUTPATIENT_CLINIC_OR_DEPARTMENT_OTHER): Payer: No Typology Code available for payment source

## 2014-08-26 ENCOUNTER — Telehealth: Payer: Self-pay | Admitting: Hematology and Oncology

## 2014-08-26 ENCOUNTER — Encounter: Payer: Self-pay | Admitting: Hematology and Oncology

## 2014-08-26 ENCOUNTER — Ambulatory Visit: Payer: No Typology Code available for payment source

## 2014-08-26 ENCOUNTER — Other Ambulatory Visit: Payer: No Typology Code available for payment source

## 2014-08-26 ENCOUNTER — Ambulatory Visit (HOSPITAL_BASED_OUTPATIENT_CLINIC_OR_DEPARTMENT_OTHER): Payer: No Typology Code available for payment source | Admitting: Hematology and Oncology

## 2014-08-26 ENCOUNTER — Ambulatory Visit (HOSPITAL_BASED_OUTPATIENT_CLINIC_OR_DEPARTMENT_OTHER): Payer: No Typology Code available for payment source

## 2014-08-26 VITALS — BP 138/69 | HR 80 | Temp 98.1°F | Resp 18 | Ht 76.0 in | Wt 220.5 lb

## 2014-08-26 DIAGNOSIS — R748 Abnormal levels of other serum enzymes: Secondary | ICD-10-CM

## 2014-08-26 DIAGNOSIS — R5383 Other fatigue: Secondary | ICD-10-CM | POA: Diagnosis not present

## 2014-08-26 DIAGNOSIS — D6481 Anemia due to antineoplastic chemotherapy: Secondary | ICD-10-CM

## 2014-08-26 DIAGNOSIS — J40 Bronchitis, not specified as acute or chronic: Secondary | ICD-10-CM

## 2014-08-26 DIAGNOSIS — Z5112 Encounter for antineoplastic immunotherapy: Secondary | ICD-10-CM

## 2014-08-26 DIAGNOSIS — C3411 Malignant neoplasm of upper lobe, right bronchus or lung: Secondary | ICD-10-CM | POA: Diagnosis not present

## 2014-08-26 DIAGNOSIS — T451X5A Adverse effect of antineoplastic and immunosuppressive drugs, initial encounter: Secondary | ICD-10-CM

## 2014-08-26 DIAGNOSIS — R7989 Other specified abnormal findings of blood chemistry: Secondary | ICD-10-CM | POA: Insufficient documentation

## 2014-08-26 DIAGNOSIS — Z95828 Presence of other vascular implants and grafts: Secondary | ICD-10-CM

## 2014-08-26 DIAGNOSIS — E46 Unspecified protein-calorie malnutrition: Secondary | ICD-10-CM | POA: Insufficient documentation

## 2014-08-26 LAB — COMPREHENSIVE METABOLIC PANEL (CC13)
ALT: 6 U/L (ref 0–55)
AST: 13 U/L (ref 5–34)
Albumin: 2.9 g/dL — ABNORMAL LOW (ref 3.5–5.0)
Alkaline Phosphatase: 89 U/L (ref 40–150)
Anion Gap: 9 mEq/L (ref 3–11)
BILIRUBIN TOTAL: 0.24 mg/dL (ref 0.20–1.20)
BUN: 17.2 mg/dL (ref 7.0–26.0)
CHLORIDE: 103 meq/L (ref 98–109)
CO2: 27 meq/L (ref 22–29)
Calcium: 9.4 mg/dL (ref 8.4–10.4)
Creatinine: 1.2 mg/dL — ABNORMAL HIGH (ref 0.6–1.1)
EGFR: 49 mL/min/{1.73_m2} — AB (ref >90)
Glucose: 141 mg/dl — ABNORMAL HIGH (ref 70–140)
Potassium: 3.7 mEq/L (ref 3.5–5.1)
Sodium: 139 mEq/L (ref 136–145)
Total Protein: 7.9 g/dL (ref 6.4–8.3)

## 2014-08-26 LAB — CBC WITH DIFFERENTIAL/PLATELET
BASO%: 0.1 % (ref 0.0–2.0)
Basophils Absolute: 0 10*3/uL (ref 0.0–0.1)
EOS%: 1.2 % (ref 0.0–7.0)
Eosinophils Absolute: 0.1 10*3/uL (ref 0.0–0.5)
HCT: 32 % — ABNORMAL LOW (ref 34.8–46.6)
HGB: 10.2 g/dL — ABNORMAL LOW (ref 11.6–15.9)
LYMPH#: 0.8 10*3/uL — AB (ref 0.9–3.3)
LYMPH%: 8.7 % — AB (ref 14.0–49.7)
MCH: 30.6 pg (ref 25.1–34.0)
MCHC: 31.9 g/dL (ref 31.5–36.0)
MCV: 96.1 fL (ref 79.5–101.0)
MONO#: 0.8 10*3/uL (ref 0.1–0.9)
MONO%: 7.9 % (ref 0.0–14.0)
NEUT%: 82.1 % — AB (ref 38.4–76.8)
NEUTROS ABS: 7.8 10*3/uL — AB (ref 1.5–6.5)
Platelets: 255 10*3/uL (ref 145–400)
RBC: 3.33 10*6/uL — AB (ref 3.70–5.45)
RDW: 14 % (ref 11.2–14.5)
WBC: 9.5 10*3/uL (ref 3.9–10.3)

## 2014-08-26 MED ORDER — SODIUM CHLORIDE 0.9 % IV SOLN
Freq: Once | INTRAVENOUS | Status: AC
  Administered 2014-08-26: 11:00:00 via INTRAVENOUS

## 2014-08-26 MED ORDER — SODIUM CHLORIDE 0.9 % IV SOLN
3.0000 mg/kg | Freq: Once | INTRAVENOUS | Status: AC
  Administered 2014-08-26: 300 mg via INTRAVENOUS
  Filled 2014-08-26: qty 30

## 2014-08-26 MED ORDER — HEPARIN SOD (PORK) LOCK FLUSH 100 UNIT/ML IV SOLN
500.0000 [IU] | Freq: Once | INTRAVENOUS | Status: AC | PRN
  Administered 2014-08-26: 500 [IU]
  Filled 2014-08-26: qty 5

## 2014-08-26 MED ORDER — SODIUM CHLORIDE 0.9 % IJ SOLN
10.0000 mL | INTRAMUSCULAR | Status: DC | PRN
  Administered 2014-08-26: 10 mL via INTRAVENOUS
  Filled 2014-08-26: qty 10

## 2014-08-26 MED ORDER — SODIUM CHLORIDE 0.9 % IJ SOLN
10.0000 mL | INTRAMUSCULAR | Status: DC | PRN
  Administered 2014-08-26: 10 mL
  Filled 2014-08-26: qty 10

## 2014-08-26 NOTE — Assessment & Plan Note (Signed)
This is related to recent treatment and infection. She is feeling a bit better. We will continue close observation for now. I encouraged the patient to increase oral intake

## 2014-08-26 NOTE — Assessment & Plan Note (Signed)
She is responding to oral antibiotics and cough medicine. If she does improve, I will consider adding corticosteroids therapy as well

## 2014-08-26 NOTE — Progress Notes (Signed)
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   12/13/2013 Imaging CT scan show large mass in the right lung apex likely representing primary lung carcinoma or large metastasis. Metastases demonstrated in the pretracheal lymph nodes, T1 vertebral body and possibly sacrum.   12/14/2013 Initial Diagnosis Adenocarcinoma of upper lobe of right lung   12/14/2013 Pathology Results Accession: [id number] biopsy from right upper lobe showed adenocarcinoma. [facility] One testing was positive for several mutations without any known treatment options now   12/17/2013 Imaging MRI of the head is negative.   12/23/2013 - 02/05/2014 Radiation Therapy She received radiation therapy to the mediastinal mass and bone   12/28/2013 Imaging PET/CT scan showed mediastinal mass, lymphadenopathy and T1 involvement.   01/01/2014 - 01/29/2014 Chemotherapy Weekly carboplatin and Taxol were added   03/12/2014 Imaging Repeat PET scan showed near complete response to treatment.   03/16/2014 - 05/28/2014 Chemotherapy She started treatment with Alimta for maintenance   07/07/2014 Imaging PET scan showed disease progression   07/15/2014 -  Chemotherapy She received palliative treatment with Opdivo   08/13/2014 - 08/25/2014 Radiation Therapy She completed palliative radiation therapy to the bone.    INTERVAL HISTORY: Please see below for problem oriented charting. She  returns prior to her for treatment of chemotherapy. She has completed radiation therapy. Last week, she started to have productive cough of yellowish sputum. She was prescribed oral anti-biotics and it is improving. Denies recent fevers or chills. Her oral intake has improved.  REVIEW OF SYSTEMS:   Constitutional: Denies fevers, chills or abnormal weight loss Eyes: Denies blurriness of vision Ears, nose, mouth, throat, and face: Denies mucositis or sore throat Cardiovascular: Denies palpitation, chest discomfort or lower extremity swelling Gastrointestinal:  Denies nausea, heartburn or change in bowel habits Skin: Denies abnormal skin rashes Lymphatics: Denies new lymphadenopathy or easy bruising Neurological:Denies numbness, tingling or new weaknesses Behavioral/Psych: Mood is stable, no new changes  All other systems were reviewed with the patient and are negative.  I have reviewed the past medical history, past surgical history, social history and family history with the patient and they are unchanged from previous note.  ALLERGIES:  is allergic to codeine; penicillins; and sulfa antibiotics.  MEDICATIONS:  Current Outpatient Prescriptions  Medication Sig Dispense Refill  . amLODipine (NORVASC) 10 MG tablet TAKE 1 TABLET BY MOUTH EVERY DAY 30 tablet 0  . BESIVANCE 0.6 % SUSP Place 1 drop into the right eye 3 (three) times daily.  1  . levofloxacin (LEVAQUIN) 500 MG tablet Take 1 tablet (500 mg total) by mouth daily. 10 tablet 0  . levothyroxine (SYNTHROID) 200 MCG tablet Take 1 tablet (200 mcg total) by mouth daily before breakfast. 30 tablet 2  . LORazepam (ATIVAN) 0.5 MG tablet Take 1 tablet (0.5 mg total) by mouth every 8 (eight) hours as needed for anxiety (or nausea). 30 tablet 0  . mirtazapine (  REMERON) 15 MG tablet Take 1 tablet (15 mg total) by mouth at bedtime as needed (For sleep.). 30 tablet 3  . ondansetron (ZOFRAN) 8 MG tablet Take 1 tablet (8 mg total) by mouth every 6  (six) hours as needed for nausea. 60 tablet 0  . prochlorperazine (COMPAZINE) 10 MG tablet Take 1 tablet (10 mg total) by mouth every 6 (six) hours as needed for nausea or vomiting. 30 tablet 1  . ranitidine (ZANTAC) 150 MG capsule Take 1 capsule (150 mg total) by mouth every evening. 30 capsule 6   No current facility-administered medications for this visit.    PHYSICAL EXAMINATION: ECOG PERFORMANCE STATUS: 1 - Symptomatic but completely ambulatory  Filed Vitals:   08/26/14 0933  BP: 138/69  Pulse: 80  Temp: 98.1 F (36.7 C)  Resp: 18   Filed Weights   08/26/14 0933  Weight: 220 lb 8 oz (100.018 kg)    GENERAL:alert, no distress and comfortable SKIN: skin color, texture, turgor are normal, no rashes or significant lesions. Noted skin discoloration around her neck from prior radiation but no ulceration seen EYES: normal, Conjunctiva are pink and non-injected, sclera clear OROPHARYNX:no exudate, no erythema and lips, buccal mucosa, and tongue normal  NECK: supple, thyroid normal size, non-tender, without nodularity LYMPH:  no palpable lymphadenopathy in the cervical, axillary or inguinal LUNGS: clear to auscultation and percussion with normal breathing effort HEART: regular rate & rhythm and no murmurs and no lower extremity edema ABDOMEN:abdomen soft, non-tender and normal bowel sounds Musculoskeletal:no cyanosis of digits and no clubbing  NEURO: alert & oriented x 3 with fluent speech, no focal motor/sensory deficits  LABORATORY DATA:  I have reviewed the data as listed    Component Value Date/Time   NA 139 08/26/2014 0902   NA 138 06/24/2014 1422   K 3.7 08/26/2014 0902   K 4.0 06/24/2014 1422   CL 96* 06/24/2014 1422   CO2 27 08/26/2014 0902   CO2 30 06/24/2014 1422   GLUCOSE 141* 08/26/2014 0902   GLUCOSE 151* 06/24/2014 1422   BUN 17.2 08/26/2014 0902   BUN 13 06/24/2014 1422   CREATININE 1.2* 08/26/2014 0902   CREATININE 1.54* 06/24/2014 1422   CALCIUM 9.4  08/26/2014 0902   CALCIUM 9.1 06/24/2014 1422   PROT 7.9 08/26/2014 0902   PROT 7.5 12/13/2013 2018   ALBUMIN 2.9* 08/26/2014 0902   ALBUMIN 3.7 12/13/2013 2018   AST 13 08/26/2014 0902   AST 17 12/13/2013 2018   ALT 6 08/26/2014 0902   ALT 14 12/13/2013 2018   ALKPHOS 89 08/26/2014 0902   ALKPHOS 80 12/13/2013 2018   BILITOT 0.24 08/26/2014 0902   BILITOT 0.3 12/13/2013 2018   GFRNONAA 36* 06/24/2014 1422   GFRAA 41* 06/24/2014 1422    No results found for: SPEP, UPEP  Lab Results  Component Value Date   WBC 9.5 08/26/2014   NEUTROABS 7.8* 08/26/2014   HGB 10.2* 08/26/2014   HCT 32.0* 08/26/2014   MCV 96.1 08/26/2014   PLT 255 08/26/2014      Chemistry      Component Value Date/Time   NA 139 08/26/2014 0902   NA 138 06/24/2014 1422   K 3.7 08/26/2014 0902   K 4.0 06/24/2014 1422   CL 96* 06/24/2014 1422   CO2 27 08/26/2014 0902   CO2 30 06/24/2014 1422   BUN 17.2 08/26/2014 0902   BUN 13 06/24/2014 1422   CREATININE 1.2* 08/26/2014 0902   CREATININE 1.54* 06/24/2014 1422  Component Value Date/Time   CALCIUM 9.4 08/26/2014 0902   CALCIUM 9.1 06/24/2014 1422   ALKPHOS 89 08/26/2014 0902   ALKPHOS 80 12/13/2013 2018   AST 13 08/26/2014 0902   AST 17 12/13/2013 2018   ALT 6 08/26/2014 0902   ALT 14 12/13/2013 2018   BILITOT 0.24 08/26/2014 0902   BILITOT 0.3 12/13/2013 2018      ASSESSMENT & PLAN:  Adenocarcinoma of upper lobe of right lung So far, she tolerated treatment well except for mild fatigue WIll continue the same I plan to do total of 6 cycles before repeat PET/CT scan in September.   Anemia due to antineoplastic chemotherapy This is likely due to recent treatment. The patient denies recent history of bleeding such as epistaxis, hematuria or hematochezia. She is asymptomatic from the anemia. I will observe for now.  She does not require transfusion now. I will continue the chemotherapy at current dose without dosage adjustment.  If the  anemia gets progressive worse in the future, I might have to delay her treatment or adjust the chemotherapy dose.   Other fatigue She has profound fatigue. Her thyroid medication was recently adjusted. It could be related to recent radiation therapy also. I will recheck function tests next month   Bronchitis She is responding to oral antibiotics and cough medicine. If she does improve, I will consider adding corticosteroids therapy as well  Protein calorie malnutrition This is related to recent treatment and infection. She is feeling a bit better. We will continue close observation for now. I encouraged the patient to increase oral intake  Elevated serum creatinine She has elevated serum creatinine but is improving slowly. She will continue to drink plenty of water and we will continue close medication adjustment and blood pressure control   Orders Placed This Encounter  Procedures  . NM PET Image Restag (PS) Skull Base To Thigh    Standing Status: Future     Number of Occurrences:      Standing Expiration Date: 10/26/2015    Order Specific Question:  Reason for Exam (SYMPTOM  OR DIAGNOSIS REQUIRED)    Answer:  staging lung ca, assess response to Rx    Order Specific Question:  Is the patient pregnant?    Answer:  No    Order Specific Question:  Preferred imaging location?    Answer:  [facility]   All questions were answered. The patient knows to call the clinic with any problems, questions or concerns. No barriers to learning was detected. I spent 30 minutes counseling the patient face to face. The total time spent in the appointment was 40 minutes and more than 50% was on counseling and review of test results     [facility], [place], MD 08/26/2014 10:18 AM

## 2014-08-26 NOTE — Telephone Encounter (Signed)
Gave and printed appt sched and avs for pt for Aug and Sept

## 2014-08-26 NOTE — Patient Instructions (Signed)
[facility] Discharge Instructions for Patients  Today you received the following: Nivolumab   To help prevent nausea and vomiting after your treatment, we encourage you to take your nausea medication as directed.    If you develop nausea and vomiting that is not controlled by your nausea medication, call the clinic.   BELOW ARE SYMPTOMS THAT SHOULD BE REPORTED IMMEDIATELY:  *FEVER GREATER THAN 100.5 F  *CHILLS WITH OR WITHOUT FEVER  NAUSEA AND VOMITING THAT IS NOT CONTROLLED WITH YOUR NAUSEA MEDICATION  *UNUSUAL SHORTNESS OF BREATH  *UNUSUAL BRUISING OR BLEEDING  TENDERNESS IN MOUTH AND THROAT WITH OR WITHOUT PRESENCE OF ULCERS  *URINARY PROBLEMS  *BOWEL PROBLEMS  UNUSUAL RASH Items with * indicate a potential emergency and should be followed up as soon as possible.  Feel free to call the clinic you have any questions or concerns. The clinic phone number is [phone].  Please show the CHEMO ALERT CARD at check-in to the Emergency Department and triage nurse.   

## 2014-08-26 NOTE — Assessment & Plan Note (Signed)
She has profound fatigue. Her thyroid medication was recently adjusted. It could be related to recent radiation therapy also. I will recheck function tests next month

## 2014-08-26 NOTE — Assessment & Plan Note (Signed)
She has elevated serum creatinine but is improving slowly. She will continue to drink plenty of water and we will continue close medication adjustment and blood pressure control

## 2014-08-26 NOTE — Assessment & Plan Note (Signed)
So far, she tolerated treatment well except for mild fatigue WIll continue the same I plan to do total of 6 cycles before repeat PET/CT scan in September.

## 2014-08-26 NOTE — Assessment & Plan Note (Signed)

## 2014-08-26 NOTE — Patient Instructions (Signed)

## 2014-08-27 ENCOUNTER — Telehealth: Payer: Self-pay | Admitting: *Deleted

## 2014-08-27 ENCOUNTER — Encounter: Payer: Self-pay | Admitting: Hematology and Oncology

## 2014-08-27 NOTE — Telephone Encounter (Signed)
Pt left signed release of information form w/ Dr. Alvy Bimler yesterday.  It is not filled out and I was unsure where she needs it to go.  I called pt and she said Raquel told her to take the form w/ her to [employer] office next week.   Informed pt that she may need to come pick this form back up to take w/ her.  It does not need to be signed by MD.  She verbalized understanding and asked for Raquel to call her back to clarify and make sure she is doing the right thing.    I gave form to Raquel and asked her to contact pt with instructions.   She agreed.

## 2014-08-27 NOTE — Progress Notes (Signed)
I called to let the patient know the form she signed was for medical records. It gives the ok for [place] to release info to whomever she request. She is applying for disability/

## 2014-09-06 ENCOUNTER — Other Ambulatory Visit: Payer: Self-pay | Admitting: Hematology and Oncology

## 2014-09-09 ENCOUNTER — Ambulatory Visit (HOSPITAL_BASED_OUTPATIENT_CLINIC_OR_DEPARTMENT_OTHER): Payer: No Typology Code available for payment source

## 2014-09-09 ENCOUNTER — Other Ambulatory Visit (HOSPITAL_BASED_OUTPATIENT_CLINIC_OR_DEPARTMENT_OTHER): Payer: No Typology Code available for payment source

## 2014-09-09 ENCOUNTER — Ambulatory Visit: Payer: No Typology Code available for payment source

## 2014-09-09 VITALS — BP 142/72 | HR 68 | Temp 97.4°F | Resp 18

## 2014-09-09 DIAGNOSIS — C3411 Malignant neoplasm of upper lobe, right bronchus or lung: Secondary | ICD-10-CM

## 2014-09-09 DIAGNOSIS — Z5112 Encounter for antineoplastic immunotherapy: Secondary | ICD-10-CM | POA: Diagnosis not present

## 2014-09-09 DIAGNOSIS — Z95828 Presence of other vascular implants and grafts: Secondary | ICD-10-CM

## 2014-09-09 LAB — COMPREHENSIVE METABOLIC PANEL (CC13)
ALBUMIN: 3.3 g/dL — AB (ref 3.5–5.0)
ALT: 12 U/L (ref 0–55)
AST: 15 U/L (ref 5–34)
Alkaline Phosphatase: 84 U/L (ref 40–150)
Anion Gap: 10 mEq/L (ref 3–11)
BUN: 15.9 mg/dL (ref 7.0–26.0)
CHLORIDE: 104 meq/L (ref 98–109)
CO2: 26 mEq/L (ref 22–29)
Calcium: 9.3 mg/dL (ref 8.4–10.4)
Creatinine: 1.2 mg/dL — ABNORMAL HIGH (ref 0.6–1.1)
EGFR: 49 mL/min/{1.73_m2} — ABNORMAL LOW (ref >90)
Glucose: 195 mg/dl — ABNORMAL HIGH (ref 70–140)
POTASSIUM: 3.6 meq/L (ref 3.5–5.1)
Sodium: 139 mEq/L (ref 136–145)
Total Bilirubin: 0.26 mg/dL (ref 0.20–1.20)
Total Protein: 7.9 g/dL (ref 6.4–8.3)

## 2014-09-09 LAB — CBC WITH DIFFERENTIAL/PLATELET
BASO%: 0.3 % (ref 0.0–2.0)
BASOS ABS: 0 10*3/uL (ref 0.0–0.1)
EOS ABS: 0.1 10*3/uL (ref 0.0–0.5)
EOS%: 1.9 % (ref 0.0–7.0)
HCT: 33.9 % — ABNORMAL LOW (ref 34.8–46.6)
HEMOGLOBIN: 10.9 g/dL — AB (ref 11.6–15.9)
LYMPH%: 10.5 % — AB (ref 14.0–49.7)
MCH: 30.8 pg (ref 25.1–34.0)
MCHC: 32.2 g/dL (ref 31.5–36.0)
MCV: 95.8 fL (ref 79.5–101.0)
MONO#: 0.6 10*3/uL (ref 0.1–0.9)
MONO%: 8.8 % (ref 0.0–14.0)
NEUT%: 78.5 % — ABNORMAL HIGH (ref 38.4–76.8)
NEUTROS ABS: 5.4 10*3/uL (ref 1.5–6.5)
PLATELETS: 231 10*3/uL (ref 145–400)
RBC: 3.54 10*6/uL — ABNORMAL LOW (ref 3.70–5.45)
RDW: 13.8 % (ref 11.2–14.5)
WBC: 6.9 10*3/uL (ref 3.9–10.3)
lymph#: 0.7 10*3/uL — ABNORMAL LOW (ref 0.9–3.3)
nRBC: 0 % (ref 0–0)

## 2014-09-09 MED ORDER — SODIUM CHLORIDE 0.9 % IJ SOLN
10.0000 mL | INTRAMUSCULAR | Status: DC | PRN
  Administered 2014-09-09: 10 mL
  Filled 2014-09-09: qty 10

## 2014-09-09 MED ORDER — SODIUM CHLORIDE 0.9 % IJ SOLN
10.0000 mL | INTRAMUSCULAR | Status: DC | PRN
  Administered 2014-09-09: 10 mL via INTRAVENOUS
  Filled 2014-09-09: qty 10

## 2014-09-09 MED ORDER — NIVOLUMAB CHEMO INJECTION 100 MG/10ML
3.0000 mg/kg | Freq: Once | INTRAVENOUS | Status: AC
  Administered 2014-09-09: 300 mg via INTRAVENOUS
  Filled 2014-09-09: qty 30

## 2014-09-09 MED ORDER — SODIUM CHLORIDE 0.9 % IV SOLN
Freq: Once | INTRAVENOUS | Status: AC
  Administered 2014-09-09: 10:00:00 via INTRAVENOUS

## 2014-09-09 MED ORDER — HEPARIN SOD (PORK) LOCK FLUSH 100 UNIT/ML IV SOLN
500.0000 [IU] | Freq: Once | INTRAVENOUS | Status: AC | PRN
  Administered 2014-09-09: 500 [IU]
  Filled 2014-09-09: qty 5

## 2014-09-09 NOTE — Patient Instructions (Signed)
[facility] Discharge Instructions for Patients Receiving Chemotherapy  Today you received the following chemotherapy agents Nivolumab.  To help prevent nausea and vomiting after your treatment, we encourage you to take your nausea medication as prescribed.   If you develop nausea and vomiting that is not controlled by your nausea medication, call the clinic.   BELOW ARE SYMPTOMS THAT SHOULD BE REPORTED IMMEDIATELY:  *FEVER GREATER THAN 100.5 F  *CHILLS WITH OR WITHOUT FEVER  NAUSEA AND VOMITING THAT IS NOT CONTROLLED WITH YOUR NAUSEA MEDICATION  *UNUSUAL SHORTNESS OF BREATH  *UNUSUAL BRUISING OR BLEEDING  TENDERNESS IN MOUTH AND THROAT WITH OR WITHOUT PRESENCE OF ULCERS  *URINARY PROBLEMS  *BOWEL PROBLEMS  UNUSUAL RASH Items with * indicate a potential emergency and should be followed up as soon as possible.  Feel free to call the clinic you have any questions or concerns. The clinic phone number is [phone].  Please show the CHEMO ALERT CARD at check-in to the Emergency Department and triage nurse.   

## 2014-09-09 NOTE — Patient Instructions (Signed)

## 2014-09-13 ENCOUNTER — Telehealth: Payer: Self-pay | Admitting: *Deleted

## 2014-09-13 DIAGNOSIS — C3411 Malignant neoplasm of upper lobe, right bronchus or lung: Secondary | ICD-10-CM

## 2014-09-13 NOTE — Telephone Encounter (Signed)
She probably has a bit of COPD. Scans are ordered for next month I recommend pulm consult. If she agreed, please proceed to place a consult

## 2014-09-13 NOTE — Telephone Encounter (Signed)
Pt agrees Pulmonary Consult.  Instructed her to call if symptoms worsen or she develops fever.  Expect call from [facility] for appt.. She verbalized understanding.  Referral placed.

## 2014-09-13 NOTE — Telephone Encounter (Signed)
Pt states still coughing up yellow phlegm in spite of taking Levaquin.

## 2014-09-15 NOTE — Progress Notes (Signed)
  Radiation Oncology         [phone] ________________________________  Name: Jasmine Grant MRN: [id number]  Date: 08/25/2014  DOB: 23-Jan-1956  End of Treatment Note  Diagnosis:   Metastatic lung cancer with osseous metastasis     Indication for treatment::  palliative       Radiation treatment dates:   08/11/2014 through 08/25/2014  Site/dose:   The patient was treated to the C3-C5 vertebral bodies to a dose of 30 gray in 10 fractions at 3 gray per fraction using a 2 field technique.  Narrative: The patient tolerated radiation treatment relatively well.     Plan: The patient has completed radiation treatment. The patient will return to radiation oncology clinic for routine followup in one month. I advised the patient to call or return sooner if they have any questions or concerns related to their recovery or treatment. ________________________________  Jodelle Gross, M.D., Ph.D.

## 2014-09-23 ENCOUNTER — Other Ambulatory Visit: Payer: Self-pay | Admitting: Hematology and Oncology

## 2014-09-23 ENCOUNTER — Ambulatory Visit (HOSPITAL_BASED_OUTPATIENT_CLINIC_OR_DEPARTMENT_OTHER): Payer: No Typology Code available for payment source

## 2014-09-23 ENCOUNTER — Other Ambulatory Visit (HOSPITAL_BASED_OUTPATIENT_CLINIC_OR_DEPARTMENT_OTHER): Payer: No Typology Code available for payment source

## 2014-09-23 ENCOUNTER — Encounter: Payer: Self-pay | Admitting: Hematology and Oncology

## 2014-09-23 ENCOUNTER — Ambulatory Visit: Payer: No Typology Code available for payment source

## 2014-09-23 VITALS — BP 151/78 | HR 66 | Temp 98.1°F | Resp 20

## 2014-09-23 DIAGNOSIS — R5383 Other fatigue: Secondary | ICD-10-CM | POA: Diagnosis not present

## 2014-09-23 DIAGNOSIS — C3411 Malignant neoplasm of upper lobe, right bronchus or lung: Secondary | ICD-10-CM | POA: Diagnosis not present

## 2014-09-23 DIAGNOSIS — Z5112 Encounter for antineoplastic immunotherapy: Secondary | ICD-10-CM

## 2014-09-23 DIAGNOSIS — Z95828 Presence of other vascular implants and grafts: Secondary | ICD-10-CM

## 2014-09-23 DIAGNOSIS — I890 Lymphedema, not elsewhere classified: Secondary | ICD-10-CM

## 2014-09-23 HISTORY — DX: Lymphedema, not elsewhere classified: I89.0

## 2014-09-23 LAB — CBC WITH DIFFERENTIAL/PLATELET
BASO%: 0.8 % (ref 0.0–2.0)
BASOS ABS: 0 10*3/uL (ref 0.0–0.1)
EOS%: 2 % (ref 0.0–7.0)
Eosinophils Absolute: 0.1 10*3/uL (ref 0.0–0.5)
HEMATOCRIT: 33.6 % — AB (ref 34.8–46.6)
HGB: 10.9 g/dL — ABNORMAL LOW (ref 11.6–15.9)
LYMPH#: 0.9 10*3/uL (ref 0.9–3.3)
LYMPH%: 13.6 % — AB (ref 14.0–49.7)
MCH: 30.4 pg (ref 25.1–34.0)
MCHC: 32.5 g/dL (ref 31.5–36.0)
MCV: 93.5 fL (ref 79.5–101.0)
MONO#: 0.6 10*3/uL (ref 0.1–0.9)
MONO%: 10 % (ref 0.0–14.0)
NEUT#: 4.7 10*3/uL (ref 1.5–6.5)
NEUT%: 73.6 % (ref 38.4–76.8)
PLATELETS: 217 10*3/uL (ref 145–400)
RBC: 3.59 10*6/uL — AB (ref 3.70–5.45)
RDW: 14.2 % (ref 11.2–14.5)
WBC: 6.3 10*3/uL (ref 3.9–10.3)

## 2014-09-23 LAB — COMPREHENSIVE METABOLIC PANEL (CC13)
ALT: 13 U/L (ref 0–55)
ANION GAP: 10 meq/L (ref 3–11)
AST: 16 U/L (ref 5–34)
Albumin: 3.5 g/dL (ref 3.5–5.0)
Alkaline Phosphatase: 82 U/L (ref 40–150)
BUN: 14.8 mg/dL (ref 7.0–26.0)
CALCIUM: 9.7 mg/dL (ref 8.4–10.4)
CHLORIDE: 105 meq/L (ref 98–109)
CO2: 26 meq/L (ref 22–29)
CREATININE: 1 mg/dL (ref 0.6–1.1)
EGFR: 58 mL/min/{1.73_m2} — AB (ref >90)
Glucose: 112 mg/dl (ref 70–140)
POTASSIUM: 3.7 meq/L (ref 3.5–5.1)
Sodium: 142 mEq/L (ref 136–145)
Total Bilirubin: 0.26 mg/dL (ref 0.20–1.20)
Total Protein: 7.8 g/dL (ref 6.4–8.3)

## 2014-09-23 LAB — TSH CHCC: TSH: 0.803 m[IU]/L (ref 0.308–3.960)

## 2014-09-23 MED ORDER — HEPARIN SOD (PORK) LOCK FLUSH 100 UNIT/ML IV SOLN
500.0000 [IU] | Freq: Once | INTRAVENOUS | Status: AC | PRN
  Administered 2014-09-23: 500 [IU]
  Filled 2014-09-23: qty 5

## 2014-09-23 MED ORDER — SODIUM CHLORIDE 0.9 % IJ SOLN
10.0000 mL | INTRAMUSCULAR | Status: DC | PRN
  Administered 2014-09-23: 10 mL
  Filled 2014-09-23: qty 10

## 2014-09-23 MED ORDER — SODIUM CHLORIDE 0.9 % IV SOLN
Freq: Once | INTRAVENOUS | Status: AC
  Administered 2014-09-23: 11:00:00 via INTRAVENOUS

## 2014-09-23 MED ORDER — SODIUM CHLORIDE 0.9 % IJ SOLN
10.0000 mL | INTRAMUSCULAR | Status: DC | PRN
  Administered 2014-09-23: 10 mL via INTRAVENOUS
  Filled 2014-09-23: qty 10

## 2014-09-23 MED ORDER — SODIUM CHLORIDE 0.9 % IV SOLN
3.0000 mg/kg | Freq: Once | INTRAVENOUS | Status: AC
  Administered 2014-09-23: 300 mg via INTRAVENOUS
  Filled 2014-09-23: qty 30

## 2014-09-23 NOTE — Progress Notes (Signed)
Pt reports a lump in lower anterior neck. Noticed 3-4 days ago. Area is red, pt reports she has been palpating it for several days. Area appears swollen, implanted port catheter noted on R side of neck. No discrete mass noted. Pt reports she feels she has to "force" swallowing. Pt reports her thyroidectomy 2000. Also has edema B ankles and feet. No pain with ambulation, no erythema noted in BLE.  Dr. Alvy Bimler notified of above. In to see pt. Pt given instructions to take Norvasc in the evenings. Take HCTZ QAM x3 for edema. She voiced understanding. 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

## 2014-09-23 NOTE — Patient Instructions (Signed)
[facility] Discharge Instructions for Patients Receiving Chemotherapy  Today you received the following chemotherapy agents: Nivolumab. If you develop nausea and vomiting that is not controlled by your nausea medication, call the clinic.   BELOW ARE SYMPTOMS THAT SHOULD BE REPORTED IMMEDIATELY:  *FEVER GREATER THAN 100.5 F  *CHILLS WITH OR WITHOUT FEVER  NAUSEA AND VOMITING THAT IS NOT CONTROLLED WITH YOUR NAUSEA MEDICATION  *UNUSUAL SHORTNESS OF BREATH  *UNUSUAL BRUISING OR BLEEDING  TENDERNESS IN MOUTH AND THROAT WITH OR WITHOUT PRESENCE OF ULCERS  *URINARY PROBLEMS  *BOWEL PROBLEMS  UNUSUAL RASH Items with * indicate a potential emergency and should be followed up as soon as possible.  Feel free to call the clinic should you have any questions or concerns. The clinic phone number is [phone].  Please show the CHEMO ALERT CARD at check-in to the Emergency Department and triage nurse.   

## 2014-09-29 ENCOUNTER — Other Ambulatory Visit: Payer: Self-pay | Admitting: Hematology and Oncology

## 2014-09-30 ENCOUNTER — Ambulatory Visit: Payer: No Typology Code available for payment source | Admitting: Physical Therapy

## 2014-10-01 ENCOUNTER — Ambulatory Visit: Payer: No Typology Code available for payment source | Admitting: Cardiology

## 2014-10-05 ENCOUNTER — Encounter (HOSPITAL_COMMUNITY)
Admission: RE | Admit: 2014-10-05 | Discharge: 2014-10-05 | Disposition: A | Discharge status: Home / Self Care | Payer: No Typology Code available for payment source | Source: Ambulatory Visit | Attending: Hematology and Oncology | Admitting: Hematology and Oncology

## 2014-10-05 ENCOUNTER — Other Ambulatory Visit: Payer: Self-pay | Admitting: Hematology and Oncology

## 2014-10-05 DIAGNOSIS — C3411 Malignant neoplasm of upper lobe, right bronchus or lung: Secondary | ICD-10-CM | POA: Insufficient documentation

## 2014-10-05 LAB — GLUCOSE, CAPILLARY: Glucose-Capillary: 109 mg/dL — ABNORMAL HIGH (ref 65–99)

## 2014-10-06 ENCOUNTER — Encounter: Payer: Self-pay | Admitting: Emergency Medicine

## 2014-10-06 ENCOUNTER — Ambulatory Visit (INDEPENDENT_AMBULATORY_CARE_PROVIDER_SITE_OTHER): Payer: No Typology Code available for payment source | Admitting: Emergency Medicine

## 2014-10-06 VITALS — BP 134/82 | HR 90 | Ht 74.0 in | Wt 220.0 lb

## 2014-10-06 DIAGNOSIS — R059 Cough, unspecified: Secondary | ICD-10-CM | POA: Insufficient documentation

## 2014-10-06 DIAGNOSIS — C3411 Malignant neoplasm of upper lobe, right bronchus or lung: Secondary | ICD-10-CM | POA: Diagnosis not present

## 2014-10-06 DIAGNOSIS — R05 Cough: Secondary | ICD-10-CM | POA: Insufficient documentation

## 2014-10-06 NOTE — Progress Notes (Signed)
Subjective:    Patient ID: Jasmine Grant, female    DOB: 1955-03-24, 59 y.o.   MRN: [id number]  HPI 59 year old former smoker (30 pk-yrs) with a diagnosis of stage IV adenocarcinoma of the lung made in November 2015. The presumed primary lesion was a large right upper lobe mass compared by metastatic disease to bone and pretracheal lymph nodes. Foundation 1 testing did not show a treatable mutation. She received radiation therapy to the mediastinum, bone. She underwent carboplatin and Taxol with good response until a PET scan 07/07/14 showed progression of disease. Palliative chemotherapy was initiated at that time, as well as further radiation therapy to bone in July 2016. She has experienced progressive cough going back to her initial XRT in February, productive of yellow phlegm. Has been associated with some progressive dyspnea. Could be limited by even walking through the house. She is able to push through the SOB without stopping.  Last treated with levofloxacin was last month > it did help her some. She denies any pleuritic pain but she does have some "rawness" in her chest especially with heavy coughing.    A PET scan was performed yesterday 10/05/14 that I have personally reviewed. This shows progression of disease including enlargement of hypermetabolic pulmonary nodules, some evidence for progressive pleural disease and some response to radiation therapy and a C4 bony lesion.   Review of Systems  Constitutional: Negative for fever, chills and unexpected weight change.  HENT: Negative for congestion, dental problem, ear pain, nosebleeds, postnasal drip, rhinorrhea, sinus pressure, sneezing, sore throat, trouble swallowing and voice change.   Eyes: Negative for visual disturbance.  Respiratory: Positive for cough and shortness of breath. Negative for choking.   Cardiovascular: Negative for chest pain and leg swelling.  Gastrointestinal: Negative for vomiting, abdominal pain and diarrhea.    Genitourinary: Negative for difficulty urinating.  Musculoskeletal: Negative for arthralgias.  Skin: Negative for rash.  Neurological: Negative for tremors, syncope and headaches.  Hematological: Does not bruise/bleed easily.    Past Medical History  Diagnosis Date  . Panic attack   . Hx of thyroid cancer     10 years ago  . Insomnia 12/29/2013  . S/P radiation therapy completed 12/16/13-02/05/14    RULL lung  . 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 Arthritis 04/27/2014  . Essential hypertension 04/27/2014  . 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 Heart disease Mother   . Heart disease Father   . Thyroid cancer Father      Social History   Social History  . Marital Status: Legally Separated    Spouse Name: N/A  . Number of Children: N/A  . Years of Education: N/A   Occupational History  . Not on file.   Social History Main Topics  . Smoking status: Former Smoker -- 1.00 packs/day for 30 years    Types: Cigarettes    Quit date: 01/29/2009  . Smokeless tobacco: Never Used  . Alcohol Use: 0.0 oz/week    0 Standard drinks or equivalent per week  . Drug Use: No  . Sexual Activity: Not on file   Other Topics Concern  . Not on file   Social History Narrative     Allergies  Allergen Reactions  . Codeine Nausea And Vomiting and Other (See Comments)    Any medication that is a codeine derivative like Hydrocodone, etc.  .  Penicillins     Unknown reaction as a child  . Sulfa Antibiotics Nausea And Vomiting     Outpatient Prescriptions Prior to Visit  Medication Sig Dispense Refill  . amLODipine (NORVASC) 10 MG tablet TAKE 1 TABLET BY MOUTH EVERY DAY 30 tablet 0  . levothyroxine (SYNTHROID, LEVOTHROID) 200 MCG tablet TAKE 1 TABLET BY MOUTH DAILY BEFORE BREAKFAST. 30 tablet 2  . LORazepam (ATIVAN) 0.5 MG tablet Take 1 tablet (0.5 mg total) by mouth every 8 (eight) hours as  needed for anxiety (or nausea). 30 tablet 0  . mirtazapine (REMERON) 15 MG tablet Take 1 tablet (15 mg total) by mouth at bedtime as needed (For sleep.). 30 tablet 3  . ondansetron (ZOFRAN) 8 MG tablet Take 1 tablet (8 mg total) by mouth every 6 (six) hours as needed for nausea. 60 tablet 0  . prochlorperazine (COMPAZINE) 10 MG tablet Take 1 tablet (10 mg total) by mouth every 6 (six) hours as needed for nausea or vomiting. 30 tablet 1  . ranitidine (ZANTAC) 150 MG capsule Take 1 capsule (150 mg total) by mouth every evening. 30 capsule 6  . BESIVANCE 0.6 % SUSP Place 1 drop into the right eye 3 (three) times daily.  1  . levofloxacin (LEVAQUIN) 500 MG tablet Take 1 tablet (500 mg total) by mouth daily. (Patient not taking: Reported on 10/06/2014) 10 tablet 0   No facility-administered medications prior to visit.         Objective:   Physical Exam Filed Vitals:   10/06/14 1422  BP: 134/82  Pulse: 90  Height: _0  (1.88 m)  Weight: 220 lb (99.791 kg)  SpO2: 95%   Gen: Pleasant, well-nourished, in no distress,  normal affect  ENT: No lesions,  mouth clear,  oropharynx clear, no postnasal drip  Neck: No JVD, no TMG, no carotid bruits  Lungs: No use of accessory muscles, few inspiratory crackles at the right lateral base  Cardiovascular: RRR, heart sounds normal, no murmur or gallops, no peripheral edema  Musculoskeletal: No deformities, no cyanosis or clubbing  Neuro: alert, non focal  Skin: Warm, no lesions or rashes    PET scan 10/05/14 --  COMPARISON: 07/07/2014  FINDINGS: NECK  A peripherally calcified lesion in the left carotid space measures a S.U.V. max of 9.5. Increased from a S.U.V. max of 3.2 on the prior exam. This has been present on multiple priors.  CHEST  Thoracic adenopathy, with a mass in the subcarinal station, extending into the azygoesophageal recess. This measures 2.8 cm and a S.U.V. max of 12.9. On the prior exam, this measured 12.5 cm and  a S.U.V. max of 9.8.  Hypermetabolic bilateral pulmonary nodules. Index right upper lobe nodule measures 11 mm and a S.U.V. max of 10.7. This is enlarged from 7 mm on the prior exam.  Trace right pleural thickening with hypermetabolism, new. This measures a S.U.V. max of 3.1 on image 74. A more nodular area of right-sided pleural-based irregularity corresponds to hypermetabolism, measuring a S.U.V. max of 6.3.  ABDOMEN/PELVIS  No extraosseous hypermetabolism within the abdomen.  SKELETON  Left-sided C4 hypermetabolism measures a S.U.V. max of 7.3. On the prior exam, this measures a S.U.V. max of 12.4.  Hypermetabolism about the spinous processes at the L1-2 level could be degenerative or malignant. This measures a S.U.V. max of 4.1, and is new.  CT IMAGES PERFORMED FOR ATTENUATION CORRECTION  Right paratracheal soft tissue mass/adenopathy. 2.3 cm on image 57 versus 2.1 cm on the  prior. Index left lower lobe pulmonary nodule measures 1.7 cm on image 87 versus 1.4 cm on the prior. Low-density hepatic lesions are again identified, likely cysts. Normal adrenal glands. Periampullary duodenal diverticulum. 8 mm right renal artery aneurysm.  IMPRESSION: 1. Progression of pulmonary and thoracic nodal metastasis. Minimal right pleural thickening and nodularity with hypermetabolism are new, suspicious for pleural metastasis. 2. Response to therapy of a left-sided C4 metastasis. New hypermetabolism in the lumbar spine could be degenerative or metastatic. 3. No extraosseous metastasis within the abdomen or pelvis.       Assessment & Plan:  Cough This is her most bothersome symptom currently although she does also have some associated exertional dyspnea. The cough sounds chronic bronchitic in nature and is intermittently productive of yellow mucus. Given her smoking history aspect she does have a component of COPD here. I would like to assess her airflows today and then  initiate a trial of maintenance bronchodilator, probably Breo given the bronchitis component here. We will also obtain a sputum cx to look for colonization, atypicals.   Adenocarcinoma of upper lobe of right lung History of stage IV adenocarcinoma of the lung, unfortunately negative for the alk rearrangement or EGFR mutation. Currently receiving palliative chemotherapy and has recently completed radiation therapy to bony disease at C4. Certainly her lung cancer may be impacting her functional capacity, overall rest or status and cough. Of note she has new progressive right pleural disease on her PET scan from yesterday 10/05/14. I did not review the PET scan results with her in detail today as she preferred to discuss these and her treatment options with Dr Alvy Bimler. I did explain to her that some of her symptoms may be addressable by treating COPD while some may be due to her lung cancer

## 2014-10-06 NOTE — Assessment & Plan Note (Signed)
History of stage IV adenocarcinoma of the lung, unfortunately negative for the alk rearrangement or EGFR mutation. Currently receiving palliative chemotherapy and has recently completed radiation therapy to bony disease at C4. Certainly her lung cancer may be impacting her functional capacity, overall rest or status and cough. Of note she has new progressive right pleural disease on her PET scan from yesterday 10/05/14. I did not review the PET scan results with her in detail today as she preferred to discuss these and her treatment options with Dr Alvy Bimler. I did explain to her that some of her symptoms may be addressable by treating COPD while some may be due to her lung cancer

## 2014-10-06 NOTE — Assessment & Plan Note (Signed)
This is her most bothersome symptom currently although she does also have some associated exertional dyspnea. The cough sounds chronic bronchitic in nature and is intermittently productive of yellow mucus. Given her smoking history aspect she does have a component of COPD here. I would like to assess her airflows today and then initiate a trial of maintenance bronchodilator, probably Breo given the bronchitis component here. We will also obtain a sputum cx to look for colonization, atypicals.

## 2014-10-06 NOTE — Patient Instructions (Signed)
We will obtain a sputum sample to be sent for the lab to look for possible bacterial colonization or atypical bacteria Spirometry was performed today and is consistent with COPD We will start Breo one inhalation daily until your next visit. Be sure to rinse and gargle after using Follow with Dr Lamonte Sakai in 4-6 weeks to assess your progress with the medication, or sooner if you have any problems

## 2014-10-07 ENCOUNTER — Ambulatory Visit (HOSPITAL_BASED_OUTPATIENT_CLINIC_OR_DEPARTMENT_OTHER): Payer: No Typology Code available for payment source | Admitting: Hematology and Oncology

## 2014-10-07 ENCOUNTER — Telehealth: Payer: Self-pay | Admitting: Hematology and Oncology

## 2014-10-07 ENCOUNTER — Ambulatory Visit (HOSPITAL_BASED_OUTPATIENT_CLINIC_OR_DEPARTMENT_OTHER): Payer: No Typology Code available for payment source

## 2014-10-07 ENCOUNTER — Other Ambulatory Visit (HOSPITAL_BASED_OUTPATIENT_CLINIC_OR_DEPARTMENT_OTHER): Payer: No Typology Code available for payment source

## 2014-10-07 ENCOUNTER — Ambulatory Visit: Payer: No Typology Code available for payment source

## 2014-10-07 ENCOUNTER — Encounter: Payer: Self-pay | Admitting: Hematology and Oncology

## 2014-10-07 VITALS — BP 149/66 | HR 70 | Temp 97.9°F | Resp 18 | Ht 74.0 in | Wt 220.4 lb

## 2014-10-07 DIAGNOSIS — F411 Generalized anxiety disorder: Secondary | ICD-10-CM

## 2014-10-07 DIAGNOSIS — C3411 Malignant neoplasm of upper lobe, right bronchus or lung: Secondary | ICD-10-CM

## 2014-10-07 DIAGNOSIS — Z23 Encounter for immunization: Secondary | ICD-10-CM

## 2014-10-07 DIAGNOSIS — G4700 Insomnia, unspecified: Secondary | ICD-10-CM

## 2014-10-07 DIAGNOSIS — Z95828 Presence of other vascular implants and grafts: Secondary | ICD-10-CM

## 2014-10-07 LAB — COMPREHENSIVE METABOLIC PANEL (CC13)
ALK PHOS: 82 U/L (ref 40–150)
ALT: 12 U/L (ref 0–55)
ANION GAP: 11 meq/L (ref 3–11)
AST: 15 U/L (ref 5–34)
Albumin: 3.7 g/dL (ref 3.5–5.0)
BUN: 16.7 mg/dL (ref 7.0–26.0)
CALCIUM: 9.7 mg/dL (ref 8.4–10.4)
CHLORIDE: 107 meq/L (ref 98–109)
CO2: 25 mEq/L (ref 22–29)
Creatinine: 1.2 mg/dL — ABNORMAL HIGH (ref 0.6–1.1)
EGFR: 47 mL/min/{1.73_m2} — AB (ref >90)
Glucose: 119 mg/dl (ref 70–140)
POTASSIUM: 3.8 meq/L (ref 3.5–5.1)
Sodium: 142 mEq/L (ref 136–145)
Total Bilirubin: 0.42 mg/dL (ref 0.20–1.20)
Total Protein: 8.1 g/dL (ref 6.4–8.3)

## 2014-10-07 LAB — CBC WITH DIFFERENTIAL/PLATELET
BASO%: 0.7 % (ref 0.0–2.0)
BASOS ABS: 0 10*3/uL (ref 0.0–0.1)
EOS ABS: 0.1 10*3/uL (ref 0.0–0.5)
EOS%: 2 % (ref 0.0–7.0)
HEMATOCRIT: 35.8 % (ref 34.8–46.6)
HGB: 11.8 g/dL (ref 11.6–15.9)
LYMPH#: 1 10*3/uL (ref 0.9–3.3)
LYMPH%: 15.9 % (ref 14.0–49.7)
MCH: 30.5 pg (ref 25.1–34.0)
MCHC: 33.1 g/dL (ref 31.5–36.0)
MCV: 92.1 fL (ref 79.5–101.0)
MONO#: 0.7 10*3/uL (ref 0.1–0.9)
MONO%: 11.3 % (ref 0.0–14.0)
NEUT#: 4.2 10*3/uL (ref 1.5–6.5)
NEUT%: 70.1 % (ref 38.4–76.8)
PLATELETS: 231 10*3/uL (ref 145–400)
RBC: 3.88 10*6/uL (ref 3.70–5.45)
RDW: 13.9 % (ref 11.2–14.5)
WBC: 6.1 10*3/uL (ref 3.9–10.3)

## 2014-10-07 MED ORDER — SODIUM CHLORIDE 0.9 % IJ SOLN
10.0000 mL | INTRAMUSCULAR | Status: DC | PRN
Start: 2014-10-07 — End: 2014-10-07
  Administered 2014-10-07: 10 mL via INTRAVENOUS
  Filled 2014-10-07: qty 10

## 2014-10-07 MED ORDER — LORAZEPAM 0.5 MG PO TABS: 0.5000 mg | ORAL_TABLET | Freq: Three times a day (TID) | ORAL | Status: DC | PRN

## 2014-10-07 MED ORDER — HEPARIN SOD (PORK) LOCK FLUSH 100 UNIT/ML IV SOLN
500.0000 [IU] | Freq: Once | INTRAVENOUS | Status: AC
  Administered 2014-10-07: 500 [IU] via INTRAVENOUS
  Filled 2014-10-07: qty 5

## 2014-10-07 NOTE — Patient Instructions (Signed)

## 2014-10-07 NOTE — Progress Notes (Signed)
[facility] OFFICE PROGRESS NOTE  Patient Care Team: Carol Ada, MD as PCP - General (Family Medicine)  SUMMARY OF ONCOLOGIC HISTORY: Oncology History   Adenocarcinoma of upper lobe of right lung   Staging form: Lung, AJCC 6th Edition     Clinical stage from 12/29/2013: Stage IV (T4, N3, M1) - Signed by [name], MD on 12/29/2013       Adenocarcinoma of upper lobe of right lung   12/13/2013 - 12/17/2013 Hospital Admission She was admitted to the hospital for workup of severe pain and neurological deficit and was found to have 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   12/13/2013 Imaging CT scan show large mass in the right lung apex likely representing primary lung carcinoma or large metastasis. Metastases demonstrated in the pretracheal lymph nodes, T1 vertebral body and possibly sacrum.   12/14/2013 Initial Diagnosis Adenocarcinoma of upper lobe of right lung   12/14/2013 Pathology Results Accession: [id number] biopsy from right upper lobe showed adenocarcinoma. Foundation One testing was positive for several mutations without any known treatment options now   12/17/2013 Imaging MRI of the head is negative.   12/23/2013 - 02/05/2014 Radiation Therapy She received radiation therapy to the mediastinal mass and bone   12/28/2013 Imaging PET/CT scan showed mediastinal mass, lymphadenopathy and T1 involvement.   01/01/2014 - 01/29/2014 Chemotherapy Weekly carboplatin and Taxol were added   03/12/2014 Imaging Repeat PET scan showed near complete response to treatment.   03/16/2014 - 05/28/2014 Chemotherapy She started treatment with Alimta for maintenance   07/07/2014 Imaging PET scan showed disease progression   07/15/2014 -  Chemotherapy She received palliative treatment with Opdivo   08/13/2014 - 08/25/2014 Radiation Therapy She completed palliative radiation therapy to the bone.   10/05/2014 Imaging PET CT showed disease progression except for site of recent  radiation therapy    INTERVAL HISTORY: Please see below for problem oriented charting. She returns for further follow-up. She still had persistent anxiety Denies any bone pain, cough , chest pain or hemoptysis  REVIEW OF SYSTEMS:   Constitutional: Denies fevers, chills or abnormal weight loss Eyes: Denies blurriness of vision Ears, nose, mouth, throat, and face: Denies mucositis or sore throat Respiratory: Denies cough, dyspnea or wheezes Cardiovascular: Denies palpitation, chest discomfort or lower extremity swelling Gastrointestinal:  Denies nausea, heartburn or change in bowel habits Skin: Denies abnormal skin rashes Lymphatics: Denies new lymphadenopathy or easy bruising Neurological:Denies numbness, tingling or new weaknesses Behavioral/Psych: Mood is stable, no new changes  All other systems were reviewed with the patient and are negative.  I have reviewed the past medical history, past surgical history, social history and family history with the patient and they are unchanged from previous note.  ALLERGIES:  is allergic to codeine; penicillins; and sulfa antibiotics.  MEDICATIONS:  Current Outpatient Prescriptions  Medication Sig Dispense Refill  . amLODipine (NORVASC) 10 MG tablet TAKE 1 TABLET BY MOUTH EVERY DAY 30 tablet 0  . BESIVANCE 0.6 % SUSP Place 1 drop into the right eye 3 (three) times daily.  1  . Fluticasone Furoate-Vilanterol (BREO ELLIPTA) 200-25 MCG/INH AEPB Inhale into the lungs daily.    Marland Kitchen levothyroxine (SYNTHROID, LEVOTHROID) 200 MCG tablet TAKE 1 TABLET BY MOUTH DAILY BEFORE BREAKFAST. 30 tablet 2  . LORazepam (ATIVAN) 0.5 MG tablet Take 1 tablet (0.5 mg total) by mouth every 8 (eight) hours as needed for anxiety (or nausea). 90 tablet 0  . mirtazapine (REMERON) 15 MG tablet Take 1 tablet (  15 mg total) by mouth at bedtime as needed (For sleep.). 30 tablet 3  . ondansetron (ZOFRAN) 8 MG tablet Take 1 tablet (8 mg total) by mouth every 6 (six) hours as  needed for nausea. 60 tablet 0  . prochlorperazine (COMPAZINE) 10 MG tablet Take 1 tablet (10 mg total) by mouth every 6 (six) hours as needed for nausea or vomiting. 30 tablet 1  . ranitidine (ZANTAC) 150 MG capsule Take 1 capsule (150 mg total) by mouth every evening. 30 capsule 6   No current facility-administered medications for this visit.    PHYSICAL EXAMINATION: ECOG PERFORMANCE STATUS: 1 - Symptomatic but completely ambulatory  Filed Vitals:   10/07/14 0921  BP: 149/66  Pulse: 70  Temp: 97.9 F (36.6 C)  Resp: 18   Filed Weights   10/07/14 0921  Weight: 220 lb 6.4 oz (99.973 kg)    GENERAL:alert, no distress and comfortable SKIN: skin color, texture, turgor are normal, no rashes or significant lesions EYES: normal, Conjunctiva are pink and non-injected, sclera clear OROPHARYNX:no exudate, no erythema and lips, buccal mucosa, and tongue normal  NECK: supple, thyroid normal size, non-tender, without nodularity LYMPH:  no palpable lymphadenopathy in the cervical, axillary or inguinal LUNGS: clear to auscultation and percussion with normal breathing effort HEART: regular rate & rhythm and no murmurs and no lower extremity edema ABDOMEN:abdomen soft, non-tender and normal bowel sounds Musculoskeletal:no cyanosis of digits and no clubbing  NEURO: alert & oriented x 3 with fluent speech, no focal motor/sensory deficits  LABORATORY DATA:  I have reviewed the data as listed    Component Value Date/Time   NA 142 10/07/2014 0904   NA 138 06/24/2014 1422   K 3.8 10/07/2014 0904   K 4.0 06/24/2014 1422   CL 96* 06/24/2014 1422   CO2 25 10/07/2014 0904   CO2 30 06/24/2014 1422   GLUCOSE 119 10/07/2014 0904   GLUCOSE 151* 06/24/2014 1422   BUN 16.7 10/07/2014 0904   BUN 13 06/24/2014 1422   CREATININE 1.2* 10/07/2014 0904   CREATININE 1.54* 06/24/2014 1422   CALCIUM 9.7 10/07/2014 0904   CALCIUM 9.1 06/24/2014 1422   PROT 8.1 10/07/2014 0904   PROT 7.5 12/13/2013 2018    ALBUMIN 3.7 10/07/2014 0904   ALBUMIN 3.7 12/13/2013 2018   AST 15 10/07/2014 0904   AST 17 12/13/2013 2018   ALT 12 10/07/2014 0904   ALT 14 12/13/2013 2018   ALKPHOS 82 10/07/2014 0904   ALKPHOS 80 12/13/2013 2018   BILITOT 0.42 10/07/2014 0904   BILITOT 0.3 12/13/2013 2018   GFRNONAA 36* 06/24/2014 1422   GFRAA 41* 06/24/2014 1422    No results found for: SPEP, UPEP  Lab Results  Component Value Date   WBC 6.1 10/07/2014   NEUTROABS 4.2 10/07/2014   HGB 11.8 10/07/2014   HCT 35.8 10/07/2014   MCV 92.1 10/07/2014   PLT 231 10/07/2014      Chemistry      Component Value Date/Time   NA 142 10/07/2014 0904   NA 138 06/24/2014 1422   K 3.8 10/07/2014 0904   K 4.0 06/24/2014 1422   CL 96* 06/24/2014 1422   CO2 25 10/07/2014 0904   CO2 30 06/24/2014 1422   BUN 16.7 10/07/2014 0904   BUN 13 06/24/2014 1422   CREATININE 1.2* 10/07/2014 0904   CREATININE 1.54* 06/24/2014 1422      Component Value Date/Time   CALCIUM 9.7 10/07/2014 0904   CALCIUM 9.1 06/24/2014 1422  ALKPHOS 82 10/07/2014 0904   ALKPHOS 80 12/13/2013 2018   AST 15 10/07/2014 0904   AST 17 12/13/2013 2018   ALT 12 10/07/2014 0904   ALT 14 12/13/2013 2018   BILITOT 0.42 10/07/2014 0904   BILITOT 0.3 12/13/2013 2018       RADIOGRAPHIC STUDIES: I reviewed the PET CT scan with the patient extensively I have personally reviewed the radiological images as listed and agreed with the findings in the report.    ASSESSMENT & PLAN:  Adenocarcinoma of upper lobe of right lung  Unfortunately, PET CT scan showed disease progression. I reviewed the current national guidelines. We discussed various treatment options. Ultimately, the patient is in agreement to be switched to weekly carboplatin, Taxol along with Avastin. The risks, benefits, side effects of treatment were fully discussed with the patient and she agreed to proceed. I will start this dose treatment next week and see her prior to cycle 1 day  8 of therapy for toxicity review.  Generalized anxiety disorder  I gave her prescription of lorazepam to take as needed. She felt better if she takes it at night and it helps her cope with insomnia and her anxiety    Orders Placed This Encounter  Procedures  . CBC with Differential    Standing Status: Standing     Number of Occurrences: 20     Standing Expiration Date: 10/08/2015  . Comprehensive metabolic panel    Standing Status: Standing     Number of Occurrences: 20     Standing Expiration Date: 10/08/2015   All questions were answered. The patient knows to call the clinic with any problems, questions or concerns. No barriers to learning was detected. I spent 30 minutes counseling the patient face to face. The total time spent in the appointment was 40 minutes and more than 50% was on counseling and review of test results     [facility], [place], MD 10/07/2014 6:05 PM

## 2014-10-07 NOTE — Progress Notes (Signed)
Pt reported to lab for de-access. Pt states she was not having treatment today and needed port to be de accessed.

## 2014-10-07 NOTE — Telephone Encounter (Signed)
Gave patient avs report and appointments for September and October  °

## 2014-10-07 NOTE — Assessment & Plan Note (Signed)
Unfortunately, PET CT scan showed disease progression. I reviewed the current national guidelines. We discussed various treatment options. Ultimately, the patient is in agreement to be switched to weekly carboplatin, Taxol along with Avastin. The risks, benefits, side effects of treatment were fully discussed with the patient and she agreed to proceed. I will start this dose treatment next week and see her prior to cycle 1 day 8 of therapy for toxicity review.

## 2014-10-07 NOTE — Addendum Note (Signed)
Addended by: Jethro Bolus A on: 10/07/2014 12:50 PM   Modules accepted: Orders, SmartSet

## 2014-10-07 NOTE — Assessment & Plan Note (Signed)
I gave her prescription of lorazepam to take as needed. She felt better if she takes it at night and it helps her cope with insomnia and her anxiety

## 2014-10-08 ENCOUNTER — Telehealth: Payer: Self-pay | Admitting: *Deleted

## 2014-10-08 ENCOUNTER — Telehealth: Payer: Self-pay | Admitting: Hematology and Oncology

## 2014-10-08 NOTE — Telephone Encounter (Signed)
PT. WAS UNABLE TO REMEMBER EVERYTHING [place] SAID AT [facility] VISIT. PT. HAS A FEW QUESTIONS BEFORE SHE STARTS THE NEW TREATMENT.

## 2014-10-08 NOTE — Telephone Encounter (Signed)
I clarified with the patient the treatment plan again. The patient requested premedication with IV lorazepam due to severe anxiety. I will put in 0.5 mg IV lorazepam before each treatment

## 2014-10-12 ENCOUNTER — Ambulatory Visit: Payer: No Typology Code available for payment source | Attending: Hematology and Oncology | Admitting: Physical Therapy

## 2014-10-12 DIAGNOSIS — I890 Lymphedema, not elsewhere classified: Secondary | ICD-10-CM | POA: Diagnosis not present

## 2014-10-12 NOTE — Therapy (Signed)
[place], [place], [address] Phone: [phone]   Fax:  [phone]  Physical Therapy Evaluation  Patient Details  Name: Jasmine Grant MRN: [id number] Date of Birth: 1955/10/17 Referring Provider:  [name], MD  Encounter Date: 10/12/2014      PT End of Session - 10/12/14 60    Visit Number 1   Number of Visits 9   Date for PT Re-Evaluation 11/12/14   PT Start Time 1529   PT Stop Time 1603   PT Time Calculation (min) 34 min   Activity Tolerance Patient tolerated treatment well   Behavior During Therapy [facility] for tasks assessed/performed      Past Medical History  Diagnosis Date  . Panic attack   . Hx of thyroid cancer     10 years ago  . Insomnia 12/29/2013  . S/P radiation therapy completed 12/16/13-02/05/14    RULL lung  . 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 Arthritis 04/27/2014  . Essential hypertension 04/27/2014  . 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 Thyroidectomy      There were no vitals filed for this visit.  Visit Diagnosis:  Acquired lymphedema - Plan: PT plan of care cert/re-cert      Subjective Assessment - 10/12/14 1533    Subjective "I noticed that I was swelling here because I had radiation here."  Also having some numbness in toes now.  Gets foot swelling and wears compression socks.   Pertinent History Lung cancer diagnosed November 2015 and on vertebrae in the neck; had chemoradiation (33 rad treatments) to vertebrae in neck (completed 02/04/14).  Then was on Alimpta, then found another small spot on vertebrae and had 10 treatment of radiation in August; then was on Opdiva; now on Avastin, carboplatin, and taxol, starting tomorrow.   Will have chemo two weeks, then off one week; will do this for three months, then have another PET scan.  No other health issues; just had  cataract surger for right eye; left eye was done long ago.   Thyroidectomy approx. 2002.           Patient Stated Goals reduce swelling   Currently in Pain? 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  No   Is the patient reluctant to leave their home because of a fear of falling?  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 week   Leisure walks 15 minutes and increasing time; walks every other day; goal is to  get to a gym at a local church   Cognition   Overall Cognitive Status Within Functional Limits for tasks assessed   Observation/Other Assessments   Observations well-healed incision across anterior neck from old thyroidectomy; indentation at right lower anterior neck, which patient reports is from her portacath   Posture/Postural Control   Posture/Postural Control Postural limitations   Postural Limitations Forward head   ROM / Strength   AROM / PROM / Strength AROM   AROM   AROM Assessment Site Cervical   Cervical Flexion 15% limited (3 fingers chin to chest)   Cervical Extension 75% limited   Cervical - Right Side Bend 50% loss  had left trap neck spasms   Cervical - Left Side Bend 50% loss   Cervical - Right Rotation 50% loss   Cervical - Left Rotation 50% loss    Palpation   Palpation comment extremely tight upper traps right > left           LYMPHEDEMA/ONCOLOGY QUESTIONNAIRE - 10/12/14 1553    Type   Cancer Type lung   Treatment   Active Chemotherapy Treatment Yes   Past Chemotherapy Treatment Yes   Past Radiation Treatment Yes   What other symptoms do you have   Are you Having Heaviness or Tightness Yes  at lower neck   Lymphedema Assessments   Lymphedema Assessments Head and Neck   Head and Neck   4 cm superior to sternal notch around neck 43.3 cm   6 cm superior to sternal notch around neck 44.7 cm   8 cm superior to sternal notch around neck 47 cm   Other Note that patient reports her swelling has been greater at times than it is today.                [place] Adult PT Treatment/Exercise - 10/12/14 0001    Self-Care   Self-Care Other Self-Care Comments   Other Self-Care Comments  Gave patient a foam chip pack in stockinette to be worn on her neck at snug and comfortable level of compression (not tight, no discomfort), 2+ hours/day.                PT Education - 10/12/14 1957    Education provided Yes   Education Details proper, safe use of chip pack for neck compression   Person(s) Educated Patient   Methods Explanation;Demonstration   Comprehension Verbalized understanding                [place] - 10/12/14 2007    CC Long Term Goal  #1   Title reduce neck circumference at 4 cm. proximal to sternal notch to 42.5 cm.   Baseline 43.3 cm. (and has been larger, per patient)   Time 4   Period Weeks   Status New   CC Long Term Goal  #2   Title Pt. will be knowledgeable about lymphedema self-care, including manual lymph drainage and use of compression   Time 4   Period Weeks   Status New            Plan - 10/12/14 1959    Clinical Impression Statement Patient is a pleasant woman who comes in for help with neck swelling as a result of radiation treatment for metastatic lung  cancer in her cervical vertebrae.  She was receptive to discussion about treatment options and wants to be helped; she is starting a new round of chemotherapy tomorrow and doesn't yet know how this  might affect her.  She has limited neck ROM but due to metastases there, we will not push trying to improve that at this time.   Pt will benefit from skilled therapeutic intervention in order to improve on the following deficits Increased edema   Rehab Potential Good   Clinical Impairments Affecting Rehab Potential metastatic cancer; currently undergoing chemotherapy   PT Frequency 2x / week   PT Duration 4 weeks   PT Treatment/Interventions Manual lymph drainage;Compression bandaging;Patient/family education;ADLs/Self Care Home Management;DME Instruction   PT Next Visit Plan Begin manual lymph drainage for her neck; begin self-care instruction; check benefit of chip pack   Consulted and Agree with Plan of Care Patient         Problem List Patient Active Problem List   Diagnosis Date Noted  . Cough 10/06/2014  . 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 Protein calorie malnutrition 08/26/2014  . Elevated serum creatinine 08/26/2014  . Right shoulder pain 06/17/2014  . 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 Arthritis 04/27/2014  . Bilateral leg edema 04/27/2014  . Sinusitis, chronic 04/27/2014  . Essential hypertension 04/27/2014  . Other constipation 04/27/2014  . Cellulitis 04/20/2014  . Anemia due to antineoplastic chemotherapy 03/16/2014  . Cyst of skin 02/26/2014  . Other fatigue 02/26/2014  . Leukopenia due to antineoplastic chemotherapy 01/28/2014  . Radiation-induced dermatitis 01/28/2014  . Esophagitis 01/07/2014  . Hypersensitivity reaction 01/04/2014  . Insomnia 12/29/2013  . Constipation due to opioid therapy 12/17/2013  . Adenocarcinoma of upper lobe of right lung   . Nausea with vomiting 12/16/2013  . Elevated blood pressure 12/15/2013  . Generalized anxiety disorder 12/14/2013   . Hypothyroidism 12/14/2013  . Overweight (BMI 25.0-29.9) 12/14/2013  . Lung mass 12/13/2013  . Epidural mass 12/13/2013  . 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 Pathologic compression fracture of spine     [place],[name] 10/12/2014, 8:18 PM  [place], [place], [address] Phone: [phone]   Fax:  [place], PT 10/12/2014 8:18 PM

## 2014-10-13 ENCOUNTER — Ambulatory Visit (HOSPITAL_BASED_OUTPATIENT_CLINIC_OR_DEPARTMENT_OTHER): Payer: No Typology Code available for payment source

## 2014-10-13 VITALS — BP 134/68 | HR 65 | Temp 98.0°F | Resp 18

## 2014-10-13 DIAGNOSIS — C3411 Malignant neoplasm of upper lobe, right bronchus or lung: Secondary | ICD-10-CM

## 2014-10-13 DIAGNOSIS — Z5112 Encounter for antineoplastic immunotherapy: Secondary | ICD-10-CM | POA: Diagnosis not present

## 2014-10-13 LAB — UA PROTEIN, DIPSTICK - CHCC: Protein, ur: 100 mg/dL

## 2014-10-13 MED ORDER — FAMOTIDINE IN NACL 20-0.9 MG/50ML-% IV SOLN
INTRAVENOUS | Status: AC
  Filled 2014-10-13: qty 50

## 2014-10-13 MED ORDER — LORAZEPAM 2 MG/ML IJ SOLN
0.5000 mg | Freq: Once | INTRAMUSCULAR | Status: AC
  Administered 2014-10-13: 0.5 mg via INTRAVENOUS

## 2014-10-13 MED ORDER — DIPHENHYDRAMINE HCL 50 MG/ML IJ SOLN
50.0000 mg | Freq: Once | INTRAMUSCULAR | Status: AC
  Administered 2014-10-13: 50 mg via INTRAVENOUS

## 2014-10-13 MED ORDER — LORAZEPAM 2 MG/ML IJ SOLN
INTRAMUSCULAR | Status: AC
  Filled 2014-10-13: qty 1

## 2014-10-13 MED ORDER — SODIUM CHLORIDE 0.9 % IV SOLN
15.0000 mg/kg | Freq: Once | INTRAVENOUS | Status: AC
  Administered 2014-10-13: 1500 mg via INTRAVENOUS
  Filled 2014-10-13: qty 60

## 2014-10-13 MED ORDER — FAMOTIDINE IN NACL 20-0.9 MG/50ML-% IV SOLN
20.0000 mg | Freq: Once | INTRAVENOUS | Status: AC
  Administered 2014-10-13: 20 mg via INTRAVENOUS

## 2014-10-13 MED ORDER — SODIUM CHLORIDE 0.9 % IV SOLN
Freq: Once | INTRAVENOUS | Status: AC
  Administered 2014-10-13: 10:00:00 via INTRAVENOUS
  Filled 2014-10-13: qty 8

## 2014-10-13 MED ORDER — SODIUM CHLORIDE 0.9 % IV SOLN
Freq: Once | INTRAVENOUS | Status: AC
  Administered 2014-10-13: 10:00:00 via INTRAVENOUS

## 2014-10-13 MED ORDER — SODIUM CHLORIDE 0.9 % IJ SOLN
10.0000 mL | INTRAMUSCULAR | Status: DC | PRN
  Administered 2014-10-13: 10 mL
  Filled 2014-10-13: qty 10

## 2014-10-13 MED ORDER — DEXTROSE 5 % IV SOLN
45.0000 mg/m2 | Freq: Once | INTRAVENOUS | Status: AC
  Administered 2014-10-13: 67 mg via INTRAVENOUS
  Filled 2014-10-13: qty 17

## 2014-10-13 MED ORDER — SODIUM CHLORIDE 0.9 % IV SOLN
207.4000 mg | Freq: Once | INTRAVENOUS | Status: AC
  Administered 2014-10-13: 210 mg via INTRAVENOUS
  Filled 2014-10-13: qty 21

## 2014-10-13 MED ORDER — HEPARIN SOD (PORK) LOCK FLUSH 100 UNIT/ML IV SOLN
500.0000 [IU] | Freq: Once | INTRAVENOUS | Status: AC | PRN
  Administered 2014-10-13: 500 [IU]
  Filled 2014-10-13: qty 5

## 2014-10-13 MED ORDER — DIPHENHYDRAMINE HCL 50 MG/ML IJ SOLN
INTRAMUSCULAR | Status: AC
  Filled 2014-10-13: qty 1

## 2014-10-13 NOTE — Progress Notes (Signed)
Ok to treat with Avastin with urine protein results 2+ today per Dr. Alvy Bimler.  Will check urine protein with next avastin.

## 2014-10-13 NOTE — Patient Instructions (Addendum)
Bevacizumab injection What is this medicine? BEVACIZUMAB (be va SIZ yoo mab) is a chemotherapy drug. It targets a protein found in many cancer cell types, and halts cancer growth. This drug treats many cancers including non-small cell lung cancer, ovarian cancer, cervical cancer, and colon or rectal cancer. It is usually given with other chemotherapy drugs. This medicine may be used for other purposes; ask your health care provider or pharmacist if you have questions. COMMON BRAND NAME(S): Avastin What should I tell my health care provider before I take this medicine? They need to know if you have any of these conditions: -blood clots -heart disease, including heart failure, heart attack, or chest pain (angina) -high blood pressure -infection (especially a virus infection such as chickenpox, cold sores, or herpes) -kidney disease -lung disease -prior chemotherapy with doxorubicin, daunorubicin, epirubicin, or other anthracycline type chemotherapy agents -recent or ongoing radiation therapy -recent surgery -stroke -an unusual or allergic reaction to bevacizumab, hamster proteins, mouse proteins, other medicines, foods, dyes, or preservatives -pregnant or trying to get pregnant -breast-feeding How should I use this medicine? This medicine is for infusion into a vein. It is given by a health care professional in a hospital or clinic setting. Talk to your pediatrician regarding the use of this medicine in children. Special care may be needed. Overdosage: If you think you have taken too much of this medicine contact a poison control center or emergency room at once. NOTE: This medicine is only for you. Do not share this medicine with others. What if I miss a dose? It is important not to miss your dose. Call your doctor or health care professional if you are unable to keep an appointment. What may interact with this medicine? Interactions are not expected. This list may not describe all  possible interactions. Give your health care provider a list of all the medicines, herbs, non-prescription drugs, or dietary supplements you use. Also tell them if you smoke, drink alcohol, or use illegal drugs. Some items may interact with your medicine. What should I watch for while using this medicine? Your condition will be monitored carefully while you are receiving this medicine. You will need important blood work and urine testing done while you are taking this medicine. During your treatment, let your health care professional know if you have any unusual symptoms, such as difficulty breathing. This medicine may rarely cause 'gastrointestinal perforation' (holes in the stomach, intestines or colon), a serious side effect requiring surgery to repair. This medicine should be started at least 28 days following major surgery and the site of the surgery should be totally healed. Check with your doctor before scheduling dental work or surgery while you are receiving this treatment. Talk to your doctor if you have recently had surgery or if you have a wound that has not healed. Do not become pregnant while taking this medicine. Women should inform their doctor if they wish to become pregnant or think they might be pregnant. There is a potential for serious side effects to an unborn child. Talk to your health care professional or pharmacist for more information. Do not breast-feed an infant while taking this medicine. This medicine has caused ovarian failure in some women. This medicine may interfere with the ability to have a child. You should talk to your doctor or health care professional if you are concerned about your fertility. What side effects may I notice from receiving this medicine? Side effects that you should report to your doctor or health care professional  as soon as possible: -allergic reactions like skin rash, itching or hives, swelling of the face, lips, or tongue -signs of infection -  fever or chills, cough, sore throat, pain or trouble passing urine -signs of decreased platelets or bleeding - bruising, pinpoint red spots on the skin, black, tarry stools, nosebleeds, blood in the urine -breathing problems -changes in vision -chest pain -confusion -jaw pain, especially after dental work -mouth sores -seizures -severe abdominal pain -severe headache -sudden numbness or weakness of the face, arm or leg -swelling of legs or ankles -symptoms of a stroke: change in mental awareness, inability to talk or move one side of the body (especially in patients with lung cancer) -trouble passing urine or change in the amount of urine -trouble speaking or understanding -trouble walking, dizziness, loss of balance or coordination Side effects that usually do not require medical attention (report to your doctor or health care professional if they continue or are bothersome): -constipation -diarrhea -dry skin -headache -loss of appetite -nausea, vomiting This list may not describe all possible side effects. Call your doctor for medical advice about side effects. You may report side effects to FDA at 1-800-FDA-1088. Where should I keep my medicine? This drug is given in a hospital or clinic and will not be stored at home. NOTE: This sheet is a summary. It may not cover all possible information. If you have questions about this medicine, talk to your doctor, pharmacist, or health care provider.  2015, [employer]. (2012-12-16 11:38:34) Paclitaxel injection What is this medicine? PACLITAXEL (PAK li TAX el) is a chemotherapy drug. It targets fast dividing cells, like cancer cells, and causes these cells to die. This medicine is used to treat ovarian cancer, breast cancer, and other cancers. This medicine may be used for other purposes; ask your health care provider or pharmacist if you have questions. COMMON BRAND NAME(S): Onxol, Taxol What should I tell my health care  provider before I take this medicine? They need to know if you have any of these conditions: -blood disorders -irregular heartbeat -infection (especially a virus infection such as chickenpox, cold sores, or herpes) -liver disease -previous or ongoing radiation therapy -an unusual or allergic reaction to paclitaxel, alcohol, polyoxyethylated castor oil, other chemotherapy agents, other medicines, foods, dyes, or preservatives -pregnant or trying to get pregnant -breast-feeding How should I use this medicine? This drug is given as an infusion into a vein. It is administered in a hospital or clinic by a specially trained health care professional. Talk to your pediatrician regarding the use of this medicine in children. Special care may be needed. Overdosage: If you think you have taken too much of this medicine contact a poison control center or emergency room at once. NOTE: This medicine is only for you. Do not share this medicine with others. What if I miss a dose? It is important not to miss your dose. Call your doctor or health care professional if you are unable to keep an appointment. What may interact with this medicine? Do not take this medicine with any of the following medications: -disulfiram -metronidazole This medicine may also interact with the following medications: -cyclosporine -diazepam -ketoconazole -medicines to increase blood counts like filgrastim, pegfilgrastim, sargramostim -other chemotherapy drugs like cisplatin, doxorubicin, epirubicin, etoposide, teniposide, vincristine -quinidine -testosterone -vaccines -verapamil Talk to your doctor or health care professional before taking any of these medicines: -acetaminophen -aspirin -ibuprofen -ketoprofen -naproxen This list may not describe all possible interactions. Give your health care provider a list of  all the medicines, herbs, non-prescription drugs, or dietary supplements you use. Also tell them if you  smoke, drink alcohol, or use illegal drugs. Some items may interact with your medicine. What should I watch for while using this medicine? Your condition will be monitored carefully while you are receiving this medicine. You will need important blood work done while you are taking this medicine. This drug may make you feel generally unwell. This is not uncommon, as chemotherapy can affect healthy cells as well as cancer cells. Report any side effects. Continue your course of treatment even though you feel ill unless your doctor tells you to stop. In some cases, you may be given additional medicines to help with side effects. Follow all directions for their use. Call your doctor or health care professional for advice if you get a fever, chills or sore throat, or other symptoms of a cold or flu. Do not treat yourself. This drug decreases your body's ability to fight infections. Try to avoid being around people who are sick. This medicine may increase your risk to bruise or bleed. Call your doctor or health care professional if you notice any unusual bleeding. Be careful brushing and flossing your teeth or using a toothpick because you may get an infection or bleed more easily. If you have any dental work done, tell your dentist you are receiving this medicine. Avoid taking products that contain aspirin, acetaminophen, ibuprofen, naproxen, or ketoprofen unless instructed by your doctor. These medicines may hide a fever. Do not become pregnant while taking this medicine. Women should inform their doctor if they wish to become pregnant or think they might be pregnant. There is a potential for serious side effects to an unborn child. Talk to your health care professional or pharmacist for more information. Do not breast-feed an infant while taking this medicine. Men are advised not to father a child while receiving this medicine. What side effects may I notice from receiving this medicine? Side effects that  you should report to your doctor or health care professional as soon as possible: -allergic reactions like skin rash, itching or hives, swelling of the face, lips, or tongue -low blood counts - This drug may decrease the number of white blood cells, red blood cells and platelets. You may be at increased risk for infections and bleeding. -signs of infection - fever or chills, cough, sore throat, pain or difficulty passing urine -signs of decreased platelets or bleeding - bruising, pinpoint red spots on the skin, black, tarry stools, nosebleeds -signs of decreased red blood cells - unusually weak or tired, fainting spells, lightheadedness -breathing problems -chest pain -high or low blood pressure -mouth sores -nausea and vomiting -pain, swelling, redness or irritation at the injection site -pain, tingling, numbness in the hands or feet -slow or irregular heartbeat -swelling of the ankle, feet, hands Side effects that usually do not require medical attention (report to your doctor or health care professional if they continue or are bothersome): -bone pain -complete hair loss including hair on your head, underarms, pubic hair, eyebrows, and eyelashes -changes in the color of fingernails -diarrhea -loosening of the fingernails -loss of appetite -muscle or joint pain -red flush to skin -sweating This list may not describe all possible side effects. Call your doctor for medical advice about side effects. You may report side effects to FDA at 1-800-FDA-1088. Where should I keep my medicine? This drug is given in a hospital or clinic and will not be stored at home. NOTE: This  sheet is a summary. It may not cover all possible information. If you have questions about this medicine, talk to your doctor, pharmacist, or health care provider.  2015, [employer]/GolCarboplatin injection What is this medicine? CARBOPLATIN (KAR boe pla tin) is a chemotherapy drug. It targets fast dividing cells, like  cancer cells, and causes these cells to die. This medicine is used to treat ovarian cancer and many other cancers. This medicine may be used for other purposes; ask your health care provider or pharmacist if you have questions. COMMON BRAND NAME(S): Paraplatin What should I tell my health care provider before I take this medicine? They need to know if you have any of these conditions: -blood disorders -hearing problems -kidney disease -recent or ongoing radiation therapy -an unusual or allergic reaction to carboplatin, cisplatin, other chemotherapy, other medicines, foods, dyes, or preservatives -pregnant or trying to get pregnant -breast-feeding How should I use this medicine? This drug is usually given as an infusion into a vein. It is administered in a hospital or clinic by a specially trained health care professional. Talk to your pediatrician regarding the use of this medicine in children. Special care may be needed. Overdosage: If you think you have taken too much of this medicine contact a poison control center or emergency room at once. NOTE: This medicine is only for you. Do not share this medicine with others. What if I miss a dose? It is important not to miss a dose. Call your doctor or health care professional if you are unable to keep an appointment. What may interact with this medicine? -medicines for seizures -medicines to increase blood counts like filgrastim, pegfilgrastim, sargramostim -some antibiotics like amikacin, gentamicin, neomycin, streptomycin, tobramycin -vaccines Talk to your doctor or health care professional before taking any of these medicines: -acetaminophen -aspirin -ibuprofen -ketoprofen -naproxen This list may not describe all possible interactions. Give your health care provider a list of all the medicines, herbs, non-prescription drugs, or dietary supplements you use. Also tell them if you smoke, drink alcohol, or use illegal drugs. Some items may  interact with your medicine. What should I watch for while using this medicine? Your condition will be monitored carefully while you are receiving this medicine. You will need important blood work done while you are taking this medicine. This drug may make you feel generally unwell. This is not uncommon, as chemotherapy can affect healthy cells as well as cancer cells. Report any side effects. Continue your course of treatment even though you feel ill unless your doctor tells you to stop. In some cases, you may be given additional medicines to help with side effects. Follow all directions for their use. Call your doctor or health care professional for advice if you get a fever, chills or sore throat, or other symptoms of a cold or flu. Do not treat yourself. This drug decreases your body's ability to fight infections. Try to avoid being around people who are sick. This medicine may increase your risk to bruise or bleed. Call your doctor or health care professional if you notice any unusual bleeding. Be careful brushing and flossing your teeth or using a toothpick because you may get an infection or bleed more easily. If you have any dental work done, tell your dentist you are receiving this medicine. Avoid taking products that contain aspirin, acetaminophen, ibuprofen, naproxen, or ketoprofen unless instructed by your doctor. These medicines may hide a fever. Do not become pregnant while taking this medicine. Women should inform their doctor if  they wish to become pregnant or think they might be pregnant. There is a potential for serious side effects to an unborn child. Talk to your health care professional or pharmacist for more information. Do not breast-feed an infant while taking this medicine. What side effects may I notice from receiving this medicine? Side effects that you should report to your doctor or health care professional as soon as possible: -allergic reactions like skin rash, itching or  hives, swelling of the face, lips, or tongue -signs of infection - fever or chills, cough, sore throat, pain or difficulty passing urine -signs of decreased platelets or bleeding - bruising, pinpoint red spots on the skin, black, tarry stools, nosebleeds -signs of decreased red blood cells - unusually weak or tired, fainting spells, lightheadedness -breathing problems -changes in hearing -changes in vision -chest pain -high blood pressure -low blood counts - This drug may decrease the number of white blood cells, red blood cells and platelets. You may be at increased risk for infections and bleeding. -nausea and vomiting -pain, swelling, redness or irritation at the injection site -pain, tingling, numbness in the hands or feet -problems with balance, talking, walking -trouble passing urine or change in the amount of urine Side effects that usually do not require medical attention (report to your doctor or health care professional if they continue or are bothersome): -hair loss -loss of appetite -metallic taste in the mouth or changes in taste This list may not describe all possible side effects. Call your doctor for medical advice about side effects. You may report side effects to FDA at 1-800-FDA-1088. Where should I keep my medicine? This drug is given in a hospital or clinic and will not be stored at home. NOTE: This sheet is a summary. It may not cover all possible information. If you have questions about this medicine, talk to your doctor, pharmacist, or health care provider.  2015, [employer]. (2007-04-22 14:38:05)

## 2014-10-14 ENCOUNTER — Telehealth: Payer: Self-pay | Admitting: *Deleted

## 2014-10-14 NOTE — Telephone Encounter (Signed)
Left message for patient to call me. Had 1st time taxol, carbo. 10/13/14

## 2014-10-20 ENCOUNTER — Other Ambulatory Visit: Payer: Self-pay | Admitting: Hematology and Oncology

## 2014-10-20 ENCOUNTER — Telehealth: Payer: Self-pay | Admitting: Hematology and Oncology

## 2014-10-20 ENCOUNTER — Ambulatory Visit (HOSPITAL_BASED_OUTPATIENT_CLINIC_OR_DEPARTMENT_OTHER): Payer: No Typology Code available for payment source | Admitting: Hematology and Oncology

## 2014-10-20 ENCOUNTER — Encounter: Payer: Self-pay | Admitting: Hematology and Oncology

## 2014-10-20 ENCOUNTER — Other Ambulatory Visit (HOSPITAL_BASED_OUTPATIENT_CLINIC_OR_DEPARTMENT_OTHER): Payer: No Typology Code available for payment source

## 2014-10-20 ENCOUNTER — Ambulatory Visit: Payer: No Typology Code available for payment source

## 2014-10-20 ENCOUNTER — Ambulatory Visit (HOSPITAL_BASED_OUTPATIENT_CLINIC_OR_DEPARTMENT_OTHER): Payer: No Typology Code available for payment source

## 2014-10-20 VITALS — BP 140/67 | HR 61 | Temp 98.2°F | Resp 18 | Ht 74.0 in | Wt 223.4 lb

## 2014-10-20 DIAGNOSIS — C3411 Malignant neoplasm of upper lobe, right bronchus or lung: Secondary | ICD-10-CM

## 2014-10-20 DIAGNOSIS — Z5111 Encounter for antineoplastic chemotherapy: Secondary | ICD-10-CM

## 2014-10-20 DIAGNOSIS — I10 Essential (primary) hypertension: Secondary | ICD-10-CM | POA: Diagnosis not present

## 2014-10-20 DIAGNOSIS — Z95828 Presence of other vascular implants and grafts: Secondary | ICD-10-CM

## 2014-10-20 LAB — COMPREHENSIVE METABOLIC PANEL (CC13)
ALT: 14 U/L (ref 0–55)
ANION GAP: 9 meq/L (ref 3–11)
AST: 15 U/L (ref 5–34)
Albumin: 3.6 g/dL (ref 3.5–5.0)
Alkaline Phosphatase: 89 U/L (ref 40–150)
BUN: 17.8 mg/dL (ref 7.0–26.0)
CHLORIDE: 108 meq/L (ref 98–109)
CO2: 26 meq/L (ref 22–29)
Calcium: 9.3 mg/dL (ref 8.4–10.4)
Creatinine: 1 mg/dL (ref 0.6–1.1)
EGFR: 59 mL/min/{1.73_m2} — AB (ref >90)
GLUCOSE: 106 mg/dL (ref 70–140)
Potassium: 3.9 mEq/L (ref 3.5–5.1)
SODIUM: 143 meq/L (ref 136–145)
TOTAL PROTEIN: 7.9 g/dL (ref 6.4–8.3)

## 2014-10-20 LAB — CBC WITH DIFFERENTIAL/PLATELET
BASO%: 0.8 % (ref 0.0–2.0)
Basophils Absolute: 0 10*3/uL (ref 0.0–0.1)
EOS%: 3.8 % (ref 0.0–7.0)
Eosinophils Absolute: 0.2 10*3/uL (ref 0.0–0.5)
HCT: 37.5 % (ref 34.8–46.6)
HGB: 12.2 g/dL (ref 11.6–15.9)
LYMPH%: 17.2 % (ref 14.0–49.7)
MCH: 29.8 pg (ref 25.1–34.0)
MCHC: 32.5 g/dL (ref 31.5–36.0)
MCV: 91.9 fL (ref 79.5–101.0)
MONO#: 0.5 10*3/uL (ref 0.1–0.9)
MONO%: 10 % (ref 0.0–14.0)
NEUT%: 68.2 % (ref 38.4–76.8)
NEUTROS ABS: 3.5 10*3/uL (ref 1.5–6.5)
Platelets: 241 10*3/uL (ref 145–400)
RBC: 4.08 10*6/uL (ref 3.70–5.45)
RDW: 13.6 % (ref 11.2–14.5)
WBC: 5.2 10*3/uL (ref 3.9–10.3)
lymph#: 0.9 10*3/uL (ref 0.9–3.3)

## 2014-10-20 LAB — UA PROTEIN, DIPSTICK - CHCC: Protein, ur: 30 mg/dL

## 2014-10-20 MED ORDER — FAMOTIDINE IN NACL 20-0.9 MG/50ML-% IV SOLN
20.0000 mg | Freq: Once | INTRAVENOUS | Status: AC
  Administered 2014-10-20: 20 mg via INTRAVENOUS

## 2014-10-20 MED ORDER — DIPHENHYDRAMINE HCL 50 MG/ML IJ SOLN
50.0000 mg | Freq: Once | INTRAMUSCULAR | Status: AC
  Administered 2014-10-20: 50 mg via INTRAVENOUS

## 2014-10-20 MED ORDER — SODIUM CHLORIDE 0.9 % IJ SOLN
10.0000 mL | INTRAMUSCULAR | Status: DC | PRN
  Administered 2014-10-20: 10 mL via INTRAVENOUS
  Filled 2014-10-20: qty 10

## 2014-10-20 MED ORDER — SODIUM CHLORIDE 0.9 % IV SOLN
238.8000 mg | Freq: Once | INTRAVENOUS | Status: AC
  Administered 2014-10-20: 240 mg via INTRAVENOUS
  Filled 2014-10-20: qty 24

## 2014-10-20 MED ORDER — SODIUM CHLORIDE 0.9 % IV SOLN
Freq: Once | INTRAVENOUS | Status: AC
  Administered 2014-10-20: 10:00:00 via INTRAVENOUS

## 2014-10-20 MED ORDER — SODIUM CHLORIDE 0.9 % IJ SOLN
10.0000 mL | INTRAMUSCULAR | Status: DC | PRN
  Administered 2014-10-20: 10 mL
  Filled 2014-10-20: qty 10

## 2014-10-20 MED ORDER — LORAZEPAM 2 MG/ML IJ SOLN
INTRAMUSCULAR | Status: AC
  Filled 2014-10-20: qty 1

## 2014-10-20 MED ORDER — SODIUM CHLORIDE 0.9 % IV SOLN
Freq: Once | INTRAVENOUS | Status: AC
  Administered 2014-10-20: 10:00:00 via INTRAVENOUS
  Filled 2014-10-20: qty 8

## 2014-10-20 MED ORDER — FAMOTIDINE IN NACL 20-0.9 MG/50ML-% IV SOLN
INTRAVENOUS | Status: AC
  Filled 2014-10-20: qty 50

## 2014-10-20 MED ORDER — DIPHENHYDRAMINE HCL 50 MG/ML IJ SOLN
INTRAMUSCULAR | Status: AC
  Filled 2014-10-20: qty 1

## 2014-10-20 MED ORDER — PACLITAXEL CHEMO INJECTION 300 MG/50ML
45.0000 mg/m2 | Freq: Once | INTRAVENOUS | Status: AC
  Administered 2014-10-20: 102 mg via INTRAVENOUS
  Filled 2014-10-20: qty 17

## 2014-10-20 MED ORDER — HEPARIN SOD (PORK) LOCK FLUSH 100 UNIT/ML IV SOLN
500.0000 [IU] | Freq: Once | INTRAVENOUS | Status: AC | PRN
Start: 2014-10-20 — End: 2014-10-20
  Administered 2014-10-20: 500 [IU]
  Filled 2014-10-20: qty 5

## 2014-10-20 MED ORDER — LORAZEPAM 2 MG/ML IJ SOLN
0.5000 mg | Freq: Once | INTRAMUSCULAR | Status: AC
  Administered 2014-10-20: 0.5 mg via INTRAVENOUS

## 2014-10-20 MED ORDER — BEVACIZUMAB CHEMO INJECTION 400 MG/16ML: 15.0000 mg/kg | Freq: Once | INTRAVENOUS | Status: DC

## 2014-10-20 NOTE — Patient Instructions (Signed)
Carboplatin injection  What is this medicine?  CARBOPLATIN (KAR boe pla tin) is a chemotherapy drug. It targets fast dividing cells, like cancer cells, and causes these cells to die. This medicine is used to treat ovarian cancer and many other cancers.  This medicine may be used for other purposes; ask your health care provider or pharmacist if you have questions.  COMMON BRAND NAME(S): Paraplatin  What should I tell my health care provider before I take this medicine?  They need to know if you have any of these conditions:  -blood disorders  -hearing problems  -kidney disease  -recent or ongoing radiation therapy  -an unusual or allergic reaction to carboplatin, cisplatin, other chemotherapy, other medicines, foods, dyes, or preservatives  -pregnant or trying to get pregnant  -breast-feeding  How should I use this medicine?  This drug is usually given as an infusion into a vein. It is administered in a hospital or clinic by a specially trained health care professional.  Talk to your pediatrician regarding the use of this medicine in children. Special care may be needed.  Overdosage: If you think you have taken too much of this medicine contact a poison control center or emergency room at once.  NOTE: This medicine is only for you. Do not share this medicine with others.  What if I miss a dose?  It is important not to miss a dose. Call your doctor or health care professional if you are unable to keep an appointment.  What may interact with this medicine?  -medicines for seizures  -medicines to increase blood counts like filgrastim, pegfilgrastim, sargramostim  -some antibiotics like amikacin, gentamicin, neomycin, streptomycin, tobramycin  -vaccines  Talk to your doctor or health care professional before taking any of these medicines:  -acetaminophen  -aspirin  -ibuprofen  -ketoprofen  -naproxen  This list may not describe all possible interactions. Give your health care provider a list of all the medicines, herbs,  non-prescription drugs, or dietary supplements you use. Also tell them if you smoke, drink alcohol, or use illegal drugs. Some items may interact with your medicine.  What should I watch for while using this medicine?  Your condition will be monitored carefully while you are receiving this medicine. You will need important blood work done while you are taking this medicine.  This drug may make you feel generally unwell. This is not uncommon, as chemotherapy can affect healthy cells as well as cancer cells. Report any side effects. Continue your course of treatment even though you feel ill unless your doctor tells you to stop.  In some cases, you may be given additional medicines to help with side effects. Follow all directions for their use.  Call your doctor or health care professional for advice if you get a fever, chills or sore throat, or other symptoms of a cold or flu. Do not treat yourself. This drug decreases your body's ability to fight infections. Try to avoid being around people who are sick.  This medicine may increase your risk to bruise or bleed. Call your doctor or health care professional if you notice any unusual bleeding.  Be careful brushing and flossing your teeth or using a toothpick because you may get an infection or bleed more easily. If you have any dental work done, tell your dentist you are receiving this medicine.  Avoid taking products that contain aspirin, acetaminophen, ibuprofen, naproxen, or ketoprofen unless instructed by your doctor. These medicines may hide a fever.  Do not become   pregnant while taking this medicine. Women should inform their doctor if they wish to become pregnant or think they might be pregnant. There is a potential for serious side effects to an unborn child. Talk to your health care professional or pharmacist for more information. Do not breast-feed an infant while taking this medicine.  What side effects may I notice from receiving this medicine?  Side effects  that you should report to your doctor or health care professional as soon as possible:  -allergic reactions like skin rash, itching or hives, swelling of the face, lips, or tongue  -signs of infection - fever or chills, cough, sore throat, pain or difficulty passing urine  -signs of decreased platelets or bleeding - bruising, pinpoint red spots on the skin, black, tarry stools, nosebleeds  -signs of decreased red blood cells - unusually weak or tired, fainting spells, lightheadedness  -breathing problems  -changes in hearing  -changes in vision  -chest pain  -high blood pressure  -low blood counts - This drug may decrease the number of white blood cells, red blood cells and platelets. You may be at increased risk for infections and bleeding.  -nausea and vomiting  -pain, swelling, redness or irritation at the injection site  -pain, tingling, numbness in the hands or feet  -problems with balance, talking, walking  -trouble passing urine or change in the amount of urine  Side effects that usually do not require medical attention (report to your doctor or health care professional if they continue or are bothersome):  -hair loss  -loss of appetite  -metallic taste in the mouth or changes in taste  This list may not describe all possible side effects. Call your doctor for medical advice about side effects. You may report side effects to FDA at 1-800-FDA-1088.  Where should I keep my medicine?  This drug is given in a hospital or clinic and will not be stored at home.  NOTE: This sheet is a summary. It may not cover all possible information. If you have questions about this medicine, talk to your doctor, pharmacist, or health care provider.   2015, Elsevier/Gold Standard. (2007-04-22 14:38:05)  Paclitaxel injection  What is this medicine?  PACLITAXEL (PAK li TAX el) is a chemotherapy drug. It targets fast dividing cells, like cancer cells, and causes these cells to die. This medicine is used to treat ovarian cancer,  breast cancer, and other cancers.  This medicine may be used for other purposes; ask your health care provider or pharmacist if you have questions.  COMMON BRAND NAME(S): Onxol, Taxol  What should I tell my health care provider before I take this medicine?  They need to know if you have any of these conditions:  -blood disorders  -irregular heartbeat  -infection (especially a virus infection such as chickenpox, cold sores, or herpes)  -liver disease  -previous or ongoing radiation therapy  -an unusual or allergic reaction to paclitaxel, alcohol, polyoxyethylated castor oil, other chemotherapy agents, other medicines, foods, dyes, or preservatives  -pregnant or trying to get pregnant  -breast-feeding  How should I use this medicine?  This drug is given as an infusion into a vein. It is administered in a hospital or clinic by a specially trained health care professional.  Talk to your pediatrician regarding the use of this medicine in children. Special care may be needed.  Overdosage: If you think you have taken too much of this medicine contact a poison control center or emergency room at once.  NOTE:   This medicine is only for you. Do not share this medicine with others.  What if I miss a dose?  It is important not to miss your dose. Call your doctor or health care professional if you are unable to keep an appointment.  What may interact with this medicine?  Do not take this medicine with any of the following medications:  -disulfiram  -metronidazole  This medicine may also interact with the following medications:  -cyclosporine  -diazepam  -ketoconazole  -medicines to increase blood counts like filgrastim, pegfilgrastim, sargramostim  -other chemotherapy drugs like cisplatin, doxorubicin, epirubicin, etoposide, teniposide, vincristine  -quinidine  -testosterone  -vaccines  -verapamil  Talk to your doctor or health care professional before taking any of these  medicines:  -acetaminophen  -aspirin  -ibuprofen  -ketoprofen  -naproxen  This list may not describe all possible interactions. Give your health care provider a list of all the medicines, herbs, non-prescription drugs, or dietary supplements you use. Also tell them if you smoke, drink alcohol, or use illegal drugs. Some items may interact with your medicine.  What should I watch for while using this medicine?  Your condition will be monitored carefully while you are receiving this medicine. You will need important blood work done while you are taking this medicine.  This drug may make you feel generally unwell. This is not uncommon, as chemotherapy can affect healthy cells as well as cancer cells. Report any side effects. Continue your course of treatment even though you feel ill unless your doctor tells you to stop.  In some cases, you may be given additional medicines to help with side effects. Follow all directions for their use.  Call your doctor or health care professional for advice if you get a fever, chills or sore throat, or other symptoms of a cold or flu. Do not treat yourself. This drug decreases your body's ability to fight infections. Try to avoid being around people who are sick.  This medicine may increase your risk to bruise or bleed. Call your doctor or health care professional if you notice any unusual bleeding.  Be careful brushing and flossing your teeth or using a toothpick because you may get an infection or bleed more easily. If you have any dental work done, tell your dentist you are receiving this medicine.  Avoid taking products that contain aspirin, acetaminophen, ibuprofen, naproxen, or ketoprofen unless instructed by your doctor. These medicines may hide a fever.  Do not become pregnant while taking this medicine. Women should inform their doctor if they wish to become pregnant or think they might be pregnant. There is a potential for serious side effects to an unborn child. Talk to  your health care professional or pharmacist for more information. Do not breast-feed an infant while taking this medicine.  Men are advised not to father a child while receiving this medicine.  What side effects may I notice from receiving this medicine?  Side effects that you should report to your doctor or health care professional as soon as possible:  -allergic reactions like skin rash, itching or hives, swelling of the face, lips, or tongue  -low blood counts - This drug may decrease the number of white blood cells, red blood cells and platelets. You may be at increased risk for infections and bleeding.  -signs of infection - fever or chills, cough, sore throat, pain or difficulty passing urine  -signs of decreased platelets or bleeding - bruising, pinpoint red spots on the skin, black, tarry   stools, nosebleeds  -signs of decreased red blood cells - unusually weak or tired, fainting spells, lightheadedness  -breathing problems  -chest pain  -high or low blood pressure  -mouth sores  -nausea and vomiting  -pain, swelling, redness or irritation at the injection site  -pain, tingling, numbness in the hands or feet  -slow or irregular heartbeat  -swelling of the ankle, feet, hands  Side effects that usually do not require medical attention (report to your doctor or health care professional if they continue or are bothersome):  -bone pain  -complete hair loss including hair on your head, underarms, pubic hair, eyebrows, and eyelashes  -changes in the color of fingernails  -diarrhea  -loosening of the fingernails  -loss of appetite  -muscle or joint pain  -red flush to skin  -sweating  This list may not describe all possible side effects. Call your doctor for medical advice about side effects. You may report side effects to FDA at 1-800-FDA-1088.  Where should I keep my medicine?  This drug is given in a hospital or clinic and will not be stored at home.  NOTE: This sheet is a summary. It may not cover all possible  information. If you have questions about this medicine, talk to your doctor, pharmacist, or health care provider.   2015, Elsevier/Gold Standard. (2012-03-10 16:41:21)

## 2014-10-20 NOTE — Assessment & Plan Note (Signed)
She is doing well with weekly carboplatin, Taxol and Avastin. No infusion reaction is noted. Her see her back next month for further review. I plan to give her 3 cycles of treatment before repeat imaging study

## 2014-10-20 NOTE — Patient Instructions (Signed)

## 2014-10-20 NOTE — Assessment & Plan Note (Signed)
Her blood pressure is well controlled in this visit. Recent urinalysis show mild proteinuria. I will continue to monitor her blood pressure carefully.

## 2014-10-20 NOTE — Progress Notes (Signed)
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   12/13/2013 Imaging CT scan show large mass in the right lung apex likely representing primary lung carcinoma or large metastasis. Metastases demonstrated in the pretracheal lymph nodes, T1 vertebral body and possibly sacrum.   12/14/2013 Initial Diagnosis Adenocarcinoma of upper lobe of right lung   12/14/2013 Pathology Results Accession: [id number] biopsy from right upper lobe showed adenocarcinoma. [facility] testing was positive for several mutations without any known treatment options now   12/17/2013 Imaging MRI of the head is negative.   12/23/2013 - 02/05/2014 Radiation Therapy She received radiation therapy to the mediastinal mass and bone   12/28/2013 Imaging PET/CT scan showed mediastinal mass, lymphadenopathy and T1 involvement.   01/01/2014 - 01/29/2014 Chemotherapy Weekly carboplatin and Taxol were added   03/12/2014 Imaging Repeat PET scan showed near complete response to treatment.   03/16/2014 - 05/28/2014 Chemotherapy She started treatment with Alimta for maintenance   07/07/2014 Imaging PET scan showed disease progression   07/15/2014 -  Chemotherapy She received palliative treatment with Opdivo   08/13/2014 - 08/25/2014 Radiation Therapy She completed palliative radiation therapy to the bone.   10/05/2014 Imaging PET CT showed disease progression except for site of recent  radiation therapy    INTERVAL HISTORY: Please see below for problem oriented charting. She is seen prior to cycle 2 of treatment. She tolerated treatment well without any side effects. She has very mild tingling sensation but it resolved without permanent neuropathy.  REVIEW OF SYSTEMS:   Constitutional: Denies fevers, chills or abnormal weight loss Eyes: Denies blurriness of vision Ears, nose, mouth, throat, and face: Denies mucositis or sore throat Respiratory: Denies cough, dyspnea or wheezes Cardiovascular: Denies palpitation, chest discomfort or lower extremity swelling Gastrointestinal:  Denies nausea, heartburn or change in bowel habits Skin: Denies abnormal skin rashes Lymphatics: Denies new lymphadenopathy or easy bruising Neurological:Denies numbness, tingling or new weaknesses Behavioral/Psych: Mood is stable, no new changes  All other systems were reviewed with the patient and are negative.  I have reviewed the past medical history, past surgical history, social history and family history with the patient and they are unchanged from previous note.  ALLERGIES:  is allergic to codeine; penicillins; and sulfa antibiotics.  MEDICATIONS:  Current Outpatient Prescriptions  Medication Sig Dispense Refill  . amLODipine (NORVASC) 10 MG tablet TAKE 1 TABLET BY MOUTH EVERY DAY 30 tablet 0  . BESIVANCE 0.6 % SUSP Place 1 drop into the right eye 3 (three) times daily.  1  . Fluticasone Furoate-Vilanterol (BREO ELLIPTA) 200-25 MCG/INH AEPB Inhale into the lungs daily.    Marland Kitchen levothyroxine (SYNTHROID, LEVOTHROID) 200 MCG tablet TAKE 1 TABLET BY MOUTH DAILY BEFORE BREAKFAST. 30 tablet 2  . LORazepam (ATIVAN) 0.5 MG tablet Take 1 tablet (0.5 mg total) by mouth every 8 (eight) hours as needed for anxiety (or nausea). 90 tablet 0  .  mirtazapine (REMERON) 15 MG tablet Take 1 tablet (15 mg total) by mouth at bedtime as needed (For sleep.). 30 tablet 3  . ondansetron (ZOFRAN) 8 MG tablet Take  1 tablet (8 mg total) by mouth every 6 (six) hours as needed for nausea. 60 tablet 0  . prochlorperazine (COMPAZINE) 10 MG tablet Take 1 tablet (10 mg total) by mouth every 6 (six) hours as needed for nausea or vomiting. 30 tablet 1  . ranitidine (ZANTAC) 150 MG capsule Take 1 capsule (150 mg total) by mouth every evening. 30 capsule 6   No current facility-administered medications for this visit.   Facility-Administered Medications Ordered in Other Visits  Medication Dose Route Frequency Provider Last Rate Last Dose  . sodium chloride 0.9 % injection 10 mL  10 mL Intracatheter PRN [name], MD   10 mL at 10/20/14 1150    PHYSICAL EXAMINATION: ECOG PERFORMANCE STATUS: 0 - Asymptomatic  Filed Vitals:   10/20/14 0847  BP: 140/67  Pulse: 61  Temp: 98.2 F (36.8 C)  Resp: 18   Filed Weights   10/20/14 0847  Weight: 223 lb 6.4 oz (101.334 kg)    GENERAL:alert, no distress and comfortable SKIN: skin color, texture, turgor are normal, no rashes or significant lesions EYES: normal, Conjunctiva are pink and non-injected, sclera clear OROPHARYNX:no exudate, no erythema and lips, buccal mucosa, and tongue normal  NECK: supple, thyroid normal size, non-tender, without nodularity LYMPH:  no palpable lymphadenopathy in the cervical, axillary or inguinal LUNGS: clear to auscultation and percussion with normal breathing effort HEART: regular rate & rhythm and no murmurs and no lower extremity edema ABDOMEN:abdomen soft, non-tender and normal bowel sounds Musculoskeletal:no cyanosis of digits and no clubbing  NEURO: alert & oriented x 3 with fluent speech, no focal motor/sensory deficits  LABORATORY DATA:  I have reviewed the data as listed    Component Value Date/Time   NA 143 10/20/2014 0801   NA 138 06/24/2014 1422   K 3.9 10/20/2014 0801   K 4.0 06/24/2014 1422   CL 96* 06/24/2014 1422   CO2 26 10/20/2014 0801   CO2 30 06/24/2014 1422   GLUCOSE 106 10/20/2014 0801   GLUCOSE  151* 06/24/2014 1422   BUN 17.8 10/20/2014 0801   BUN 13 06/24/2014 1422   CREATININE 1.0 10/20/2014 0801   CREATININE 1.54* 06/24/2014 1422   CALCIUM 9.3 10/20/2014 0801   CALCIUM 9.1 06/24/2014 1422   PROT 7.9 10/20/2014 0801   PROT 7.5 12/13/2013 2018   ALBUMIN 3.6 10/20/2014 0801   ALBUMIN 3.7 12/13/2013 2018   AST 15 10/20/2014 0801   AST 17 12/13/2013 2018   ALT 14 10/20/2014 0801   ALT 14 12/13/2013 2018   ALKPHOS 89 10/20/2014 0801   ALKPHOS 80 12/13/2013 2018   BILITOT <0.30 10/20/2014 0801   BILITOT 0.3 12/13/2013 2018   GFRNONAA 36* 06/24/2014 1422   GFRAA 41* 06/24/2014 1422    No results found for: SPEP, UPEP  Lab Results  Component Value Date   WBC 5.2 10/20/2014   NEUTROABS 3.5 10/20/2014   HGB 12.2 10/20/2014   HCT 37.5 10/20/2014   MCV 91.9 10/20/2014   PLT 241 10/20/2014      Chemistry      Component Value Date/Time   NA 143 10/20/2014 0801   NA 138 06/24/2014 1422   K 3.9 10/20/2014 0801   K 4.0 06/24/2014 1422   CL 96* 06/24/2014 1422   CO2 26 10/20/2014 0801   CO2 30  06/24/2014 1422   BUN 17.8 10/20/2014 0801   BUN 13 06/24/2014 1422   CREATININE 1.0 10/20/2014 0801   CREATININE 1.54* 06/24/2014 1422      Component Value Date/Time   CALCIUM 9.3 10/20/2014 0801   CALCIUM 9.1 06/24/2014 1422   ALKPHOS 89 10/20/2014 0801   ALKPHOS 80 12/13/2013 2018   AST 15 10/20/2014 0801   AST 17 12/13/2013 2018   ALT 14 10/20/2014 0801   ALT 14 12/13/2013 2018   BILITOT <0.30 10/20/2014 0801   BILITOT 0.3 12/13/2013 2018      ASSESSMENT & PLAN:  Adenocarcinoma of upper lobe of right lung  She is doing well with weekly carboplatin, Taxol and Avastin. No infusion reaction is noted. Her see her back next month for further review. I plan to give her 3 cycles of treatment before repeat imaging study   Essential hypertension  Her blood pressure is well controlled in this visit. Recent urinalysis show mild proteinuria. I will continue to  monitor her blood pressure carefully.   No orders of the defined types were placed in this encounter.   All questions were answered. The patient knows to call the clinic with any problems, questions or concerns. No barriers to learning was detected. I spent 15 minutes counseling the patient face to face. The total time spent in the appointment was 20 minutes and more than 50% was on counseling and review of test results     [facility], NI, MD 10/20/2014 12:13 PM

## 2014-10-20 NOTE — Telephone Encounter (Signed)
gave and printed appt sched and avs for pt for Sept and OCT

## 2014-10-25 ENCOUNTER — Ambulatory Visit: Payer: No Typology Code available for payment source | Admitting: Physical Therapy

## 2014-10-25 DIAGNOSIS — I890 Lymphedema, not elsewhere classified: Secondary | ICD-10-CM | POA: Diagnosis not present

## 2014-10-25 NOTE — Therapy (Signed)
[place], [place], [address] Phone: [phone]   Fax:  [phone]  Physical Therapy Treatment  Patient Details  Name: Jasmine Grant MRN: [id number] Date of Birth: 06/30/1955 Referring Provider:  [name], MD  Encounter Date: 10/25/2014      PT End of Session - 10/25/14 1459    Visit Number 2   Number of Visits 9   Date for PT Re-Evaluation 11/12/14   PT Start Time 1108   PT Stop Time 1150   PT Time Calculation (min) 42 min   Activity Tolerance Patient tolerated treatment well   Behavior During Therapy [facility] for tasks assessed/performed      Past Medical History  Diagnosis Date  . Panic attack   . Hx of thyroid cancer     10 years ago  . Insomnia 12/29/2013  . S/P radiation therapy completed 12/16/13-02/05/14    RULL lung  . 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 Arthritis 04/27/2014  . Essential hypertension 04/27/2014  . 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 Thyroidectomy      There were no vitals filed for this visit.  Visit Diagnosis:  Acquired lymphedema      Subjective Assessment - 10/25/14 1109    Subjective I wore the chip pack a couple of hours pretty much every day--don't know if I see a difference.   Currently in Pain? Yes               LYMPHEDEMA/ONCOLOGY QUESTIONNAIRE - 10/25/14 1145    Head and Neck   4 cm superior to sternal notch around neck 43.3 cm   6 cm superior to sternal notch around neck 44.1 cm   8 cm superior to sternal notch around neck 46.3 cm                  [facility] Adult PT Treatment/Exercise - 10/25/14 0001    Manual Therapy   Manual Therapy Edema management;Manual Lymphatic Drainage (MLD)   Edema Management circumference measurements taken after manual lymph drainage was performed.   Manual Lymphatic Drainage (MLD) diaphragmatic  breathing, short neck, bilateral axillae and upper arm, bilateral shoulder collectors; posterolateral neck, lateral neck, anterolateral neck, and anterior neck; chin, cheeks, and area betwee ears and eyes.  Instructed patient in basics about techniques of manual lymph drainage before performing it today.                        [place] - 10/12/14 2007    CC Long Term Goal  #1   Title reduce neck circumference at 4 cm. proximal to sternal notch to 42.5 cm.   Baseline 43.3 cm. (and has been larger, per patient)   Time 4   Period Weeks   Status New   CC Long Term Goal  #2   Title Pt. will be knowledgeable about lymphedema self-care, including manual lymph drainage and use of compression   Time 4   Period Weeks   Status New            Plan - 10/25/14 1459    Clinical Impression Statement Despite patient being unsure if it was helping, she has used the foam chip pack for compression a couple of hours a day at home.  AFter manual lymph drainage today, the most inferior neck  circumference measurement was unchanged, but the two more superior measurements were reduced by 0.6 and 0.7 cm, and this although patient felt like initial measurements were taken on a day with less than usual swelling.   Pt will benefit from skilled therapeutic intervention in order to improve on the following deficits Increased edema   Rehab Potential Good   Clinical Impairments Affecting Rehab Potential metastatic cancer; currently undergoing chemotherapy   PT Frequency 2x / week   PT Duration 4 weeks   PT Treatment/Interventions Patient/family education;Manual lymph drainage   PT Next Visit Plan Continue instruction and have patient perform manual lymph drainage for her neck; give handout.   Consulted and Agree with Plan of Care Patient        Problem List Patient Active Problem List   Diagnosis Date Noted  . Cough 10/06/2014  . 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 Protein calorie  malnutrition 08/26/2014  . Elevated serum creatinine 08/26/2014  . Right shoulder pain 06/17/2014  . 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 Arthritis 04/27/2014  . Bilateral leg edema 04/27/2014  . Sinusitis, chronic 04/27/2014  . Essential hypertension 04/27/2014  . Other constipation 04/27/2014  . Cellulitis 04/20/2014  . Anemia due to antineoplastic chemotherapy 03/16/2014  . Cyst of skin 02/26/2014  . Other fatigue 02/26/2014  . Leukopenia due to antineoplastic chemotherapy 01/28/2014  . Radiation-induced dermatitis 01/28/2014  . Esophagitis 01/07/2014  . Hypersensitivity reaction 01/04/2014  . Insomnia 12/29/2013  . Constipation due to opioid therapy 12/17/2013  . Adenocarcinoma of upper lobe of right lung   . Nausea with vomiting 12/16/2013  . Elevated blood pressure 12/15/2013  . Generalized anxiety disorder 12/14/2013  . Hypothyroidism 12/14/2013  . Overweight (BMI 25.0-29.9) 12/14/2013  . Lung mass 12/13/2013  . Epidural mass 12/13/2013  . 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 Pathologic compression fracture of spine     Grant,Jasmine 10/25/2014, 3:03 PM  [place], [place], [address] Phone: [phone]   Fax:  [place], PT 10/25/2014 3:03 PM

## 2014-10-27 ENCOUNTER — Ambulatory Visit: Payer: No Typology Code available for payment source

## 2014-10-29 ENCOUNTER — Ambulatory Visit: Payer: No Typology Code available for payment source | Admitting: Physical Therapy

## 2014-11-01 ENCOUNTER — Ambulatory Visit: Payer: No Typology Code available for payment source | Admitting: Physical Therapy

## 2014-11-03 ENCOUNTER — Ambulatory Visit (HOSPITAL_BASED_OUTPATIENT_CLINIC_OR_DEPARTMENT_OTHER): Payer: No Typology Code available for payment source

## 2014-11-03 ENCOUNTER — Encounter: Payer: Self-pay | Admitting: Hematology and Oncology

## 2014-11-03 ENCOUNTER — Other Ambulatory Visit: Payer: Self-pay | Admitting: Hematology and Oncology

## 2014-11-03 ENCOUNTER — Other Ambulatory Visit (HOSPITAL_BASED_OUTPATIENT_CLINIC_OR_DEPARTMENT_OTHER): Payer: No Typology Code available for payment source | Admitting: *Deleted

## 2014-11-03 ENCOUNTER — Ambulatory Visit: Payer: No Typology Code available for payment source

## 2014-11-03 ENCOUNTER — Other Ambulatory Visit (HOSPITAL_BASED_OUTPATIENT_CLINIC_OR_DEPARTMENT_OTHER): Payer: No Typology Code available for payment source

## 2014-11-03 VITALS — BP 135/67 | HR 67 | Temp 99.2°F | Resp 18

## 2014-11-03 DIAGNOSIS — Z5111 Encounter for antineoplastic chemotherapy: Secondary | ICD-10-CM | POA: Diagnosis not present

## 2014-11-03 DIAGNOSIS — C3411 Malignant neoplasm of upper lobe, right bronchus or lung: Secondary | ICD-10-CM | POA: Diagnosis not present

## 2014-11-03 DIAGNOSIS — R300 Dysuria: Secondary | ICD-10-CM

## 2014-11-03 DIAGNOSIS — Z5112 Encounter for antineoplastic immunotherapy: Secondary | ICD-10-CM

## 2014-11-03 DIAGNOSIS — Z95828 Presence of other vascular implants and grafts: Secondary | ICD-10-CM

## 2014-11-03 HISTORY — DX: Dysuria: R30.0

## 2014-11-03 LAB — COMPREHENSIVE METABOLIC PANEL (CC13)
ALT: 18 U/L (ref 0–55)
AST: 17 U/L (ref 5–34)
Albumin: 3.5 g/dL (ref 3.5–5.0)
Alkaline Phosphatase: 101 U/L (ref 40–150)
Anion Gap: 9 mEq/L (ref 3–11)
BUN: 19.7 mg/dL (ref 7.0–26.0)
CHLORIDE: 109 meq/L (ref 98–109)
CO2: 23 meq/L (ref 22–29)
Calcium: 9.3 mg/dL (ref 8.4–10.4)
Creatinine: 1.1 mg/dL (ref 0.6–1.1)
EGFR: 57 mL/min/{1.73_m2} — ABNORMAL LOW (ref >90)
GLUCOSE: 128 mg/dL (ref 70–140)
POTASSIUM: 3.9 meq/L (ref 3.5–5.1)
SODIUM: 141 meq/L (ref 136–145)
Total Bilirubin: <0.3 mg/dL (ref 0.20–1.20)
Total Protein: 7.9 g/dL (ref 6.4–8.3)

## 2014-11-03 LAB — URINALYSIS, MICROSCOPIC - CHCC
Bilirubin (Urine): NEGATIVE
Glucose: NEGATIVE mg/dL
Ketones: NEGATIVE mg/dL
NITRITE: NEGATIVE
PROTEIN: 100 mg/dL
SPECIFIC GRAVITY, URINE: 1.015 (ref 1.003–1.035)
UROBILINOGEN UR: 0.2 mg/dL (ref 0.2–1)
pH: 6 (ref 4.6–8.0)

## 2014-11-03 LAB — CBC WITH DIFFERENTIAL/PLATELET
BASO%: 0.6 % (ref 0.0–2.0)
BASOS ABS: 0 10*3/uL (ref 0.0–0.1)
EOS%: 2 % (ref 0.0–7.0)
Eosinophils Absolute: 0.1 10*3/uL (ref 0.0–0.5)
HCT: 37.6 % (ref 34.8–46.6)
HEMOGLOBIN: 12.3 g/dL (ref 11.6–15.9)
LYMPH%: 16.6 % (ref 14.0–49.7)
MCH: 30 pg (ref 25.1–34.0)
MCHC: 32.7 g/dL (ref 31.5–36.0)
MCV: 91.8 fL (ref 79.5–101.0)
MONO#: 0.7 10*3/uL (ref 0.1–0.9)
MONO%: 10.6 % (ref 0.0–14.0)
NEUT#: 4.6 10*3/uL (ref 1.5–6.5)
NEUT%: 70.2 % (ref 38.4–76.8)
Platelets: 219 10*3/uL (ref 145–400)
RBC: 4.09 10*6/uL (ref 3.70–5.45)
RDW: 14.5 % (ref 11.2–14.5)
WBC: 6.6 10*3/uL (ref 3.9–10.3)
lymph#: 1.1 10*3/uL (ref 0.9–3.3)

## 2014-11-03 MED ORDER — CIPROFLOXACIN HCL 250 MG PO TABS: 250.0000 mg | ORAL_TABLET | Freq: Two times a day (BID) | ORAL | Status: DC

## 2014-11-03 MED ORDER — SODIUM CHLORIDE 0.9 % IV SOLN: Freq: Once | INTRAVENOUS | Status: DC

## 2014-11-03 MED ORDER — SODIUM CHLORIDE 0.9 % IV SOLN
240.0000 mg | Freq: Once | INTRAVENOUS | Status: AC
  Administered 2014-11-03: 240 mg via INTRAVENOUS
  Filled 2014-11-03: qty 24

## 2014-11-03 MED ORDER — LORAZEPAM 2 MG/ML IJ SOLN
0.5000 mg | Freq: Once | INTRAMUSCULAR | Status: AC
  Administered 2014-11-03: 0.5 mg via INTRAVENOUS

## 2014-11-03 MED ORDER — DIPHENHYDRAMINE HCL 50 MG/ML IJ SOLN
INTRAMUSCULAR | Status: AC
  Filled 2014-11-03: qty 1

## 2014-11-03 MED ORDER — HEPARIN SOD (PORK) LOCK FLUSH 100 UNIT/ML IV SOLN
500.0000 [IU] | Freq: Once | INTRAVENOUS | Status: AC | PRN
Start: 2014-11-03 — End: 2014-11-03
  Administered 2014-11-03: 500 [IU]
  Filled 2014-11-03: qty 5

## 2014-11-03 MED ORDER — LORAZEPAM 2 MG/ML IJ SOLN
INTRAMUSCULAR | Status: AC
  Filled 2014-11-03: qty 1

## 2014-11-03 MED ORDER — SODIUM CHLORIDE 0.9 % IJ SOLN
10.0000 mL | INTRAMUSCULAR | Status: DC | PRN
  Administered 2014-11-03: 10 mL via INTRAVENOUS
  Filled 2014-11-03: qty 10

## 2014-11-03 MED ORDER — DIPHENHYDRAMINE HCL 50 MG/ML IJ SOLN
50.0000 mg | Freq: Once | INTRAMUSCULAR | Status: AC
  Administered 2014-11-03: 50 mg via INTRAVENOUS

## 2014-11-03 MED ORDER — SODIUM CHLORIDE 0.9 % IV SOLN
Freq: Once | INTRAVENOUS | Status: AC
  Administered 2014-11-03: 11:00:00 via INTRAVENOUS
  Filled 2014-11-03: qty 8

## 2014-11-03 MED ORDER — SODIUM CHLORIDE 0.9 % IJ SOLN
10.0000 mL | INTRAMUSCULAR | Status: DC | PRN
  Administered 2014-11-03: 10 mL
  Filled 2014-11-03: qty 10

## 2014-11-03 MED ORDER — FAMOTIDINE IN NACL 20-0.9 MG/50ML-% IV SOLN
20.0000 mg | Freq: Once | INTRAVENOUS | Status: AC
  Administered 2014-11-03: 20 mg via INTRAVENOUS

## 2014-11-03 MED ORDER — FAMOTIDINE IN NACL 20-0.9 MG/50ML-% IV SOLN
INTRAVENOUS | Status: AC
  Filled 2014-11-03: qty 50

## 2014-11-03 MED ORDER — PACLITAXEL CHEMO INJECTION 300 MG/50ML
45.0000 mg/m2 | Freq: Once | INTRAVENOUS | Status: AC
  Administered 2014-11-03: 102 mg via INTRAVENOUS
  Filled 2014-11-03: qty 17

## 2014-11-03 MED ORDER — SODIUM CHLORIDE 0.9 % IV SOLN
Freq: Once | INTRAVENOUS | Status: AC
  Administered 2014-11-03: 11:00:00 via INTRAVENOUS

## 2014-11-03 MED ORDER — SODIUM CHLORIDE 0.9 % IV SOLN
15.0000 mg/kg | Freq: Once | INTRAVENOUS | Status: AC
  Administered 2014-11-03: 1500 mg via INTRAVENOUS
  Filled 2014-11-03: qty 60

## 2014-11-03 NOTE — Patient Instructions (Signed)
[facility] Discharge Instructions for Patients Receiving Chemotherapy  Today you received the following chemotherapy agents Avastin, Taxol and Carboplatin.  To help prevent nausea and vomiting after your treatment, we encourage you to take your nausea medication as prescribed.   If you develop nausea and vomiting that is not controlled by your nausea medication, call the clinic.   BELOW ARE SYMPTOMS THAT SHOULD BE REPORTED IMMEDIATELY:  *FEVER GREATER THAN 100.5 F  *CHILLS WITH OR WITHOUT FEVER  NAUSEA AND VOMITING THAT IS NOT CONTROLLED WITH YOUR NAUSEA MEDICATION  *UNUSUAL SHORTNESS OF BREATH  *UNUSUAL BRUISING OR BLEEDING  TENDERNESS IN MOUTH AND THROAT WITH OR WITHOUT PRESENCE OF ULCERS  *URINARY PROBLEMS  *BOWEL PROBLEMS  UNUSUAL RASH Items with * indicate a potential emergency and should be followed up as soon as possible.  Feel free to call the clinic you have any questions or concerns. The clinic phone number is [phone].  Please show the [facility] at check-in to the Emergency Department and triage nurse.

## 2014-11-03 NOTE — Patient Instructions (Signed)

## 2014-11-05 ENCOUNTER — Ambulatory Visit: Payer: No Typology Code available for payment source | Attending: Hematology and Oncology | Admitting: Physical Therapy

## 2014-11-05 ENCOUNTER — Other Ambulatory Visit: Payer: Self-pay | Admitting: Hematology and Oncology

## 2014-11-05 DIAGNOSIS — I890 Lymphedema, not elsewhere classified: Secondary | ICD-10-CM | POA: Insufficient documentation

## 2014-11-05 DIAGNOSIS — M539 Dorsopathy, unspecified: Secondary | ICD-10-CM | POA: Diagnosis present

## 2014-11-05 NOTE — Therapy (Signed)
[place], [place], [address] Phone: [phone]   Fax:  [phone]  Physical Therapy Treatment  Patient Details  Name: Jasmine Grant MRN: [id number] Date of Birth: Jul 11, 1955 Referring Provider:  [name], MD  Encounter Date: 11/05/2014      PT End of Session - 11/05/14 1259    Visit Number 3   Number of Visits 9   Date for PT Re-Evaluation 11/12/14   PT Start Time 0940   PT Stop Time 1022   PT Time Calculation (min) 42 min   Activity Tolerance Patient tolerated treatment well   Behavior During Therapy [facility] for tasks assessed/performed      Past Medical History  Diagnosis Date  . Panic attack   . Hx of thyroid cancer     10 years ago  . Insomnia 12/29/2013  . S/P radiation therapy completed 12/16/13-02/05/14    RULL lung  . Lung cancer ([place]) 12/14/13    Adenocarcinoma of right upper lobe mediastinal mass and bone   . Arthritis 04/27/2014  . Essential hypertension 04/27/2014  . 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 Dysuria 11/03/2014    Past Surgical History  Procedure Laterality Date  . Thyroidectomy      There were no vitals filed for this visit.  Visit Diagnosis:  Acquired lymphedema      Subjective Assessment - 11/05/14 0942    Subjective Nothing new.  Started chemo again this week, and Friday's when the fatigue hits and Saturday is worse.   Currently in Pain? No/denies                         [facility] Adult PT Treatment/Exercise - 11/05/14 0001    Manual Therapy   Manual Lymphatic Drainage (MLD) Instructed patient and had her perform as follows: diaphragmatic breathing, short neck, bilateral axillae, bilateral shoulder collectors; posterolateral neck, lateral neck, anterolateral neck, and anterior neck; chin, cheeks, and area betwee ears and eyes.                  PT Education - 11/05/14 1259    Education provided Yes   Education Details self-manual lymph  drainage   Person(s) Educated Patient   Methods Explanation;Verbal cues;Tactile cues;Handout   Comprehension Returned demonstration                Long Term Clinic Goals - 11/05/14 1300    CC Long Term Goal  #1   Title reduce neck circumference at 4 cm. proximal to sternal notch to 42.5 cm.   Status On-going   CC Long Term Goal  #2   Title Pt. will be knowledgeable about lymphedema self-care, including manual lymph drainage and use of compression   Status Partially Met            Plan - 11/05/14 1259    Clinical Impression Statement Patient did fairly well learning self-manual lymph drainage today.     Pt will benefit from skilled therapeutic intervention in order to improve on the following deficits Increased edema   Rehab Potential Good   Clinical Impairments Affecting Rehab Potential metastatic cancer; currently undergoing chemotherapy   PT Frequency 2x / week   PT Duration 4 weeks   PT Treatment/Interventions Patient/family education;Manual lymph drainage   PT Next Visit Plan Review self-manual lymph drainage; remeasure.   Consulted and Agree with Plan of Care Patient        Problem List Patient Active  Problem List   Diagnosis Date Noted  . Dysuria 11/03/2014  . Cough 10/06/2014  . 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 Protein calorie malnutrition ([place]) 08/26/2014  . Elevated serum creatinine 08/26/2014  . Right shoulder pain 06/17/2014  . 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 Arthritis 04/27/2014  . Bilateral leg edema 04/27/2014  . Sinusitis, chronic 04/27/2014  . Essential hypertension 04/27/2014  . Other constipation 04/27/2014  . Cellulitis 04/20/2014  . Anemia due to antineoplastic chemotherapy 03/16/2014  . Cyst of skin 02/26/2014  . Other fatigue 02/26/2014  . Leukopenia due to antineoplastic chemotherapy 01/28/2014  . Radiation-induced dermatitis 01/28/2014  . Esophagitis 01/07/2014  . Hypersensitivity reaction 01/04/2014  . Insomnia  12/29/2013  . Constipation due to opioid therapy 12/17/2013  . Adenocarcinoma of upper lobe of right lung   . Nausea with vomiting 12/16/2013  . Elevated blood pressure 12/15/2013  . Generalized anxiety disorder 12/14/2013  . Hypothyroidism 12/14/2013  . Overweight (BMI 25.0-29.9) 12/14/2013  . Lung mass 12/13/2013  . Epidural mass 12/13/2013  . 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 Pathologic compression fracture of spine     Jasmine Grant 11/05/2014, 1:01 PM  [place], [place], [address] Phone: [phone]   Fax:  [place], PT 11/05/2014 1:01 PM

## 2014-11-05 NOTE — Patient Instructions (Signed)
MANUAL LYMPH DRAINAGE FOR NECK AND FACE   1) Place hands beside the neck and behind the collar bones and do stationary circles outward 10 times. 2) Do stationary circles at right armpit area (about 10 times) and the left armpit (about 10 times) 3) Place hand at front of shoulder and do circles downward, about 10 times each side. 4) Place hands on areas just above collar bones and do 15-20 stationary circles outward 5) Place hands on either side of neck and do 15-20 stationary circles back and down (one hand at a time or both, whichever works for you) 6) Place one hand a little more toward the front of the neck and do circles back and down; same with other hand on other side of front of neck, 15-20 times. 7) With one hand, spread the thumb and first finger apart; use the webspace there to pump downward from just below your chin toward the bottom of your neck right at the front of the neck.  Do 10 times. 8) Place hands just behind that line going down the neck from the earlobes and do stationary circles forward and down (toward that "river" going down from the earlobes).  Do 10 times. 9) Repeat #1. 10) Do stationary circles on each side of the face at the jaw line (10x)  11) Do stationary circles on each side of the face on the cheeks (10x) 12) Do stationary circles on each side of the face between the eyes and ears (10x) 13) Repeat steps 5,4,3,2    Do this once or twice a day. Do not slide on the skin. Only give enough pressure to stretch the skin. Make sure to always wash your hands prior to massage.

## 2014-11-08 LAB — URINE CULTURE

## 2014-11-09 ENCOUNTER — Ambulatory Visit: Payer: No Typology Code available for payment source | Admitting: Physical Therapy

## 2014-11-09 DIAGNOSIS — I890 Lymphedema, not elsewhere classified: Secondary | ICD-10-CM | POA: Diagnosis not present

## 2014-11-09 NOTE — Therapy (Signed)
[place], [place], [address] Phone: [phone]   Fax:  [phone]  Physical Therapy Treatment  Patient Details  Name: Jasmine Grant MRN: [id number] Date of Birth: 1955/05/06 Referring Provider:  [name], MD  Encounter Date: 11/09/2014      PT End of Session - 11/09/14 1710    Visit Number 4   Number of Visits 9   Date for PT Re-Evaluation 11/12/14   PT Start Time 5329   PT Stop Time 1700   PT Time Calculation (min) 45 min   Activity Tolerance Patient tolerated treatment well   Behavior During Therapy [facility] for tasks assessed/performed      Past Medical History  Diagnosis Date  . Panic attack   . Hx of thyroid cancer     10 years ago  . Insomnia 12/29/2013  . S/P radiation therapy completed 12/16/13-02/05/14    RULL lung  . 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 Arthritis 04/27/2014  . Essential hypertension 04/27/2014  . 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 Dysuria 11/03/2014    Past Surgical History  Procedure Laterality Date  . Thyroidectomy      There were no vitals filed for this visit.  Visit Diagnosis:  Acquired lymphedema      Subjective Assessment - 11/09/14 1619    Subjective I think the steroids are doing a number on my face and neck swelling.  "I'm not giving up.  I'm going to fight this tooth and nail."   Currently in Pain? No/denies               LYMPHEDEMA/ONCOLOGY QUESTIONNAIRE - 11/09/14 1624    Head and Neck   4 cm superior to sternal notch around neck 43.4 cm   6 cm superior to sternal notch around neck 44.3 cm   8 cm superior to sternal notch around neck 45.9 cm                  [facility] Adult PT Treatment/Exercise - 11/09/14 0001    Manual Therapy   Edema Management circumference measurements taken before manual lymph drainage was performed.   Manual Lymphatic Drainage (MLD) short neck,  bilateral axillae, bilateral shoulder collectors; posterolateral neck, lateral neck, anterolateral neck, and anterior neck; chin, cheeks, and area betwee ears and eyes.                  PT Education - 11/09/14 1709    Education provided Yes   Education Details self-manual lymph drainage   Person(s) Educated Patient   Methods Explanation;Demonstration;Handout;Verbal cues  reprinted handout given last time   Comprehension Verbalized understanding                Long Term Clinic Goals - 11/05/14 1300    CC Long Term Goal  #1   Title reduce neck circumference at 4 cm. proximal to sternal notch to 42.5 cm.   Status On-going   CC Long Term Goal  #2   Title Pt. will be knowledgeable about lymphedema self-care, including manual lymph drainage and use of compression   Status Partially Met            Plan - 11/09/14 1710    Clinical Impression Statement Patient had lost her handout from last visit but had tried performing self-manual lymph drainage at home anyway, and seems to have remembered most of it.  Therapist  performed it today for her for her review, and reprinted handout.  Measurements have changed little, and one measurement is smaller, despite the patient's sense that the swelling has increased from the steroid she is taking for chemo.   Pt will benefit from skilled therapeutic intervention in order to improve on the following deficits Increased edema   Rehab Potential Good   Clinical Impairments Affecting Rehab Potential metastatic cancer; currently undergoing chemotherapy   PT Frequency 2x / week   PT Duration 4 weeks   PT Treatment/Interventions Patient/family education;Manual lymph drainage;Manual techniques   PT Next Visit Plan Show pictures and samples of manufactured neck compression garments; continue manual lymph drainage.     Consulted and Agree with Plan of Care Patient        Problem List Patient Active Problem List   Diagnosis Date Noted  .  Dysuria 11/03/2014  . Cough 10/06/2014  . 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 Protein calorie malnutrition ([place]) 08/26/2014  . Elevated serum creatinine 08/26/2014  . Right shoulder pain 06/17/2014  . 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 Arthritis 04/27/2014  . Bilateral leg edema 04/27/2014  . Sinusitis, chronic 04/27/2014  . Essential hypertension 04/27/2014  . Other constipation 04/27/2014  . Cellulitis 04/20/2014  . Anemia due to antineoplastic chemotherapy 03/16/2014  . Cyst of skin 02/26/2014  . Other fatigue 02/26/2014  . Leukopenia due to antineoplastic chemotherapy 01/28/2014  . Radiation-induced dermatitis 01/28/2014  . Esophagitis 01/07/2014  . Hypersensitivity reaction 01/04/2014  . Insomnia 12/29/2013  . Constipation due to opioid therapy 12/17/2013  . Adenocarcinoma of upper lobe of right lung   . Nausea with vomiting 12/16/2013  . Elevated blood pressure 12/15/2013  . Generalized anxiety disorder 12/14/2013  . Hypothyroidism 12/14/2013  . Overweight (BMI 25.0-29.9) 12/14/2013  . Lung mass 12/13/2013  . Epidural mass 12/13/2013  . 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 Pathologic compression fracture of spine     SALISBURY,[name] 11/09/2014, 5:14 PM  [place], [place], [address] Phone: [phone]   Fax:  [place], PT 11/09/2014 5:14 PM

## 2014-11-10 ENCOUNTER — Ambulatory Visit (HOSPITAL_BASED_OUTPATIENT_CLINIC_OR_DEPARTMENT_OTHER): Payer: No Typology Code available for payment source

## 2014-11-10 ENCOUNTER — Ambulatory Visit: Payer: No Typology Code available for payment source

## 2014-11-10 ENCOUNTER — Other Ambulatory Visit (HOSPITAL_BASED_OUTPATIENT_CLINIC_OR_DEPARTMENT_OTHER): Payer: No Typology Code available for payment source

## 2014-11-10 ENCOUNTER — Telehealth: Payer: Self-pay | Admitting: Hematology and Oncology

## 2014-11-10 ENCOUNTER — Encounter: Payer: Self-pay | Admitting: Hematology and Oncology

## 2014-11-10 ENCOUNTER — Ambulatory Visit (HOSPITAL_BASED_OUTPATIENT_CLINIC_OR_DEPARTMENT_OTHER): Payer: No Typology Code available for payment source | Admitting: Hematology and Oncology

## 2014-11-10 VITALS — BP 150/79 | HR 86 | Temp 98.2°F | Resp 19 | Ht 74.0 in | Wt 227.3 lb

## 2014-11-10 DIAGNOSIS — G622 Polyneuropathy due to other toxic agents: Secondary | ICD-10-CM | POA: Diagnosis not present

## 2014-11-10 DIAGNOSIS — I10 Essential (primary) hypertension: Secondary | ICD-10-CM

## 2014-11-10 DIAGNOSIS — Z95828 Presence of other vascular implants and grafts: Secondary | ICD-10-CM

## 2014-11-10 DIAGNOSIS — C3411 Malignant neoplasm of upper lobe, right bronchus or lung: Secondary | ICD-10-CM

## 2014-11-10 DIAGNOSIS — Z5111 Encounter for antineoplastic chemotherapy: Secondary | ICD-10-CM | POA: Diagnosis not present

## 2014-11-10 DIAGNOSIS — T451X5A Adverse effect of antineoplastic and immunosuppressive drugs, initial encounter: Secondary | ICD-10-CM

## 2014-11-10 DIAGNOSIS — G620 Drug-induced polyneuropathy: Secondary | ICD-10-CM | POA: Insufficient documentation

## 2014-11-10 DIAGNOSIS — D61810 Antineoplastic chemotherapy induced pancytopenia: Secondary | ICD-10-CM | POA: Insufficient documentation

## 2014-11-10 LAB — UA PROTEIN, DIPSTICK - CHCC: Protein, ur: 30 mg/dL

## 2014-11-10 LAB — COMPREHENSIVE METABOLIC PANEL (CC13)
ALBUMIN: 3.7 g/dL (ref 3.5–5.0)
ALK PHOS: 114 U/L (ref 40–150)
ALT: 15 U/L (ref 0–55)
ANION GAP: 11 meq/L (ref 3–11)
AST: 16 U/L (ref 5–34)
BUN: 24.1 mg/dL (ref 7.0–26.0)
CALCIUM: 9.7 mg/dL (ref 8.4–10.4)
CHLORIDE: 106 meq/L (ref 98–109)
CO2: 23 mEq/L (ref 22–29)
Creatinine: 1.2 mg/dL — ABNORMAL HIGH (ref 0.6–1.1)
EGFR: 52 mL/min/{1.73_m2} — AB (ref >90)
Glucose: 108 mg/dl (ref 70–140)
POTASSIUM: 4.1 meq/L (ref 3.5–5.1)
Sodium: 140 mEq/L (ref 136–145)
Total Bilirubin: 0.31 mg/dL (ref 0.20–1.20)
Total Protein: 8 g/dL (ref 6.4–8.3)

## 2014-11-10 LAB — CBC WITH DIFFERENTIAL/PLATELET
BASO%: 0.1 % (ref 0.0–2.0)
BASOS ABS: 0 10*3/uL (ref 0.0–0.1)
EOS ABS: 0.2 10*3/uL (ref 0.0–0.5)
EOS%: 2.3 % (ref 0.0–7.0)
HEMATOCRIT: 37.8 % (ref 34.8–46.6)
HEMOGLOBIN: 12.3 g/dL (ref 11.6–15.9)
LYMPH#: 1.4 10*3/uL (ref 0.9–3.3)
LYMPH%: 19.8 % (ref 14.0–49.7)
MCH: 30 pg (ref 25.1–34.0)
MCHC: 32.5 g/dL (ref 31.5–36.0)
MCV: 92.2 fL (ref 79.5–101.0)
MONO#: 0.6 10*3/uL (ref 0.1–0.9)
MONO%: 8.3 % (ref 0.0–14.0)
NEUT#: 4.9 10*3/uL (ref 1.5–6.5)
NEUT%: 69.5 % (ref 38.4–76.8)
Platelets: 139 10*3/uL — ABNORMAL LOW (ref 145–400)
RBC: 4.1 10*6/uL (ref 3.70–5.45)
RDW: 14.1 % (ref 11.2–14.5)
WBC: 7 10*3/uL (ref 3.9–10.3)

## 2014-11-10 MED ORDER — SODIUM CHLORIDE 0.9 % IJ SOLN
10.0000 mL | INTRAMUSCULAR | Status: DC | PRN
  Administered 2014-11-10: 10 mL via INTRAVENOUS
  Filled 2014-11-10: qty 10

## 2014-11-10 MED ORDER — SODIUM CHLORIDE 0.9 % IV SOLN
207.4000 mg | Freq: Once | INTRAVENOUS | Status: AC
  Administered 2014-11-10: 210 mg via INTRAVENOUS
  Filled 2014-11-10: qty 21

## 2014-11-10 MED ORDER — PACLITAXEL CHEMO INJECTION 300 MG/50ML
45.0000 mg/m2 | Freq: Once | INTRAVENOUS | Status: AC
  Administered 2014-11-10: 102 mg via INTRAVENOUS
  Filled 2014-11-10: qty 17

## 2014-11-10 MED ORDER — DIPHENHYDRAMINE HCL 50 MG/ML IJ SOLN
INTRAMUSCULAR | Status: AC
  Filled 2014-11-10: qty 1

## 2014-11-10 MED ORDER — SODIUM CHLORIDE 0.9 % IJ SOLN
10.0000 mL | INTRAMUSCULAR | Status: DC | PRN
  Administered 2014-11-10: 10 mL
  Filled 2014-11-10: qty 10

## 2014-11-10 MED ORDER — SODIUM CHLORIDE 0.9 % IV SOLN
Freq: Once | INTRAVENOUS | Status: AC
  Administered 2014-11-10: 09:00:00 via INTRAVENOUS

## 2014-11-10 MED ORDER — SODIUM CHLORIDE 0.9 % IV SOLN
Freq: Once | INTRAVENOUS | Status: AC
  Administered 2014-11-10: 09:00:00 via INTRAVENOUS
  Filled 2014-11-10: qty 8

## 2014-11-10 MED ORDER — LORAZEPAM 2 MG/ML IJ SOLN
INTRAMUSCULAR | Status: AC
  Filled 2014-11-10: qty 1

## 2014-11-10 MED ORDER — DIPHENHYDRAMINE HCL 50 MG/ML IJ SOLN
50.0000 mg | Freq: Once | INTRAMUSCULAR | Status: AC
Start: 2014-11-10 — End: 2014-11-10
  Administered 2014-11-10: 50 mg via INTRAVENOUS

## 2014-11-10 MED ORDER — FAMOTIDINE IN NACL 20-0.9 MG/50ML-% IV SOLN
INTRAVENOUS | Status: AC
  Filled 2014-11-10: qty 50

## 2014-11-10 MED ORDER — HEPARIN SOD (PORK) LOCK FLUSH 100 UNIT/ML IV SOLN
500.0000 [IU] | Freq: Once | INTRAVENOUS | Status: AC | PRN
  Administered 2014-11-10: 500 [IU]
  Filled 2014-11-10: qty 5

## 2014-11-10 MED ORDER — LORAZEPAM 2 MG/ML IJ SOLN
0.5000 mg | Freq: Once | INTRAMUSCULAR | Status: AC
Start: 2014-11-10 — End: 2014-11-10
  Administered 2014-11-10: 0.5 mg via INTRAVENOUS

## 2014-11-10 MED ORDER — FAMOTIDINE IN NACL 20-0.9 MG/50ML-% IV SOLN
20.0000 mg | Freq: Once | INTRAVENOUS | Status: AC
  Administered 2014-11-10: 20 mg via INTRAVENOUS

## 2014-11-10 NOTE — Progress Notes (Signed)
[facility] OFFICE PROGRESS NOTE  Patient Care Team: Carol Ada, MD as PCP - General (Family Medicine)  SUMMARY OF ONCOLOGIC HISTORY: Oncology History   Adenocarcinoma of upper lobe of right lung   Staging form: Lung, AJCC 6th Edition     Clinical stage from 12/29/2013: Stage IV (T4, N3, M1) - Signed by [name], MD on 12/29/2013       Adenocarcinoma of upper lobe of right lung   12/13/2013 - 12/17/2013 Hospital Admission She was admitted to the hospital for workup of severe pain and neurological deficit and was found to have 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   12/13/2013 Imaging CT scan show large mass in the right lung apex likely representing primary lung carcinoma or large metastasis. Metastases demonstrated in the pretracheal lymph nodes, T1 vertebral body and possibly sacrum.   12/14/2013 Initial Diagnosis Adenocarcinoma of upper lobe of right lung   12/14/2013 Pathology Results Accession: [id number] biopsy from right upper lobe showed adenocarcinoma. [facility] One testing was positive for several mutations without any known treatment options now   12/17/2013 Imaging MRI of the head is negative.   12/23/2013 - 02/05/2014 Radiation Therapy She received radiation therapy to the mediastinal mass and bone   12/28/2013 Imaging PET/CT scan showed mediastinal mass, lymphadenopathy and T1 involvement.   01/01/2014 - 01/29/2014 Chemotherapy Weekly carboplatin and Taxol were added   03/12/2014 Imaging Repeat PET scan showed near complete response to treatment.   03/16/2014 - 05/28/2014 Chemotherapy She started treatment with Alimta for maintenance   07/07/2014 Imaging PET scan showed disease progression   07/15/2014 - 09/23/2014 Chemotherapy She received palliative treatment with Opdivo   08/13/2014 - 08/25/2014 Radiation Therapy She completed palliative radiation therapy to the bone.   10/05/2014 Imaging PET CT showed disease progression except for site of  recent radiation therapy   10/13/2014 -  Chemotherapy She received Rx with weekly carbo/taxol, 2 weeks on 1 week off with Avastin every 3 weeks    INTERVAL HISTORY: Please see below for problem oriented charting. She returns for further follow-up, seen prior to chemotherapy. She complains of very mild peripheral neuropathy in the feet but it does not bother her. She saw interesting show on television and wants to be referred to [facility] because of a super computer genius that is supposed to help with treatment. She tolerated treatment well without any mucositis, nausea or vomiting.  REVIEW OF SYSTEMS:   Constitutional: Denies fevers, chills or abnormal weight loss Eyes: Denies blurriness of vision Ears, nose, mouth, throat, and face: Denies mucositis or sore throat Respiratory: Denies cough, dyspnea or wheezes Cardiovascular: Denies palpitation, chest discomfort or lower extremity swelling Gastrointestinal:  Denies nausea, heartburn or change in bowel habits Skin: Denies abnormal skin rashes Lymphatics: Denies new lymphadenopathy or easy bruising Neurological:Denies numbness, tingling or new weaknesses Behavioral/Psych: Mood is stable, no new changes  All other systems were reviewed with the patient and are negative.  I have reviewed the past medical history, past surgical history, social history and family history with the patient and they are unchanged from previous note.  ALLERGIES:  is allergic to codeine; penicillins; and sulfa antibiotics.  MEDICATIONS:  Current Outpatient Prescriptions  Medication Sig Dispense Refill  . amLODipine (NORVASC) 10 MG tablet TAKE 1 TABLET BY MOUTH EVERY DAY 30 tablet 0  . BESIVANCE 0.6 % SUSP Place 1 drop into the right eye 3 (three) times daily.  1  . Fluticasone Furoate-Vilanterol (BREO ELLIPTA) 200-25  MCG/INH AEPB Inhale into the lungs daily.    Marland Kitchen levothyroxine (SYNTHROID, LEVOTHROID) 200 MCG tablet TAKE 1 TABLET BY MOUTH DAILY BEFORE  BREAKFAST. 30 tablet 2  . LORazepam (ATIVAN) 0.5 MG tablet Take 1 tablet (0.5 mg total) by mouth every 8 (eight) hours as needed for anxiety (or nausea). 90 tablet 0  . mirtazapine (REMERON) 15 MG tablet Take 1 tablet (15 mg total) by mouth at bedtime as needed (For sleep.). 30 tablet 3  . ondansetron (ZOFRAN) 8 MG tablet Take 1 tablet (8 mg total) by mouth every 6 (six) hours as needed for nausea. 60 tablet 0  . prochlorperazine (COMPAZINE) 10 MG tablet Take 1 tablet (10 mg total) by mouth every 6 (six) hours as needed for nausea or vomiting. 30 tablet 1  . ranitidine (ZANTAC) 150 MG capsule Take 1 capsule (150 mg total) by mouth every evening. 30 capsule 6   No current facility-administered medications for this visit.   Facility-Administered Medications Ordered in Other Visits  Medication Dose Route Frequency Provider Last Rate Last Dose  . CARBOplatin (PARAPLATIN) 210 mg in sodium chloride 0.9 % 100 mL chemo infusion  210 mg Intravenous Once [name], MD      . famotidine (PEPCID) IVPB 20 mg premix  20 mg Intravenous Once [name], MD      . heparin lock flush 100 unit/mL  500 Units Intracatheter Once PRN [name], MD      . ondansetron (ZOFRAN) 16 mg, dexamethasone (DECADRON) 20 mg in sodium chloride 0.9 % 50 mL IVPB   Intravenous Once [name], MD      . PACLitaxel (TAXOL) 102 mg in dextrose 5 % 250 mL chemo infusion (</= '80mg'$ /m2)  45 mg/m2 (Treatment Plan Actual) Intravenous Once [name], MD      . sodium chloride 0.9 % injection 10 mL  10 mL Intracatheter PRN [name], MD        PHYSICAL EXAMINATION: ECOG PERFORMANCE STATUS: 0 - Asymptomatic  Filed Vitals:   11/10/14 0840  BP: 150/79  Pulse: 86  Temp: 98.2 F (36.8 C)  Resp: 19   Filed Weights   11/10/14 0840  Weight: 227 lb 4.8 oz (103.103 kg)    GENERAL:alert, no distress and comfortable SKIN: skin color, texture, turgor are normal, no rashes or significant lesions EYES: normal, Conjunctiva are pink and  non-injected, sclera clear OROPHARYNX:no exudate, no erythema and lips, buccal mucosa, and tongue normal  NECK: supple, thyroid normal size, non-tender, without nodularity LYMPH:  no palpable lymphadenopathy in the cervical, axillary or inguinal LUNGS: clear to auscultation and percussion with normal breathing effort HEART: regular rate & rhythm and no murmurs and no lower extremity edema ABDOMEN:abdomen soft, non-tender and normal bowel sounds Musculoskeletal:no cyanosis of digits and no clubbing  NEURO: alert & oriented x 3 with fluent speech, no focal motor/sensory deficits  LABORATORY DATA:  I have reviewed the data as listed    Component Value Date/Time   NA 140 11/10/2014 0810   NA 138 06/24/2014 1422   K 4.1 11/10/2014 0810   K 4.0 06/24/2014 1422   CL 96* 06/24/2014 1422   CO2 23 11/10/2014 0810   CO2 30 06/24/2014 1422   GLUCOSE 108 11/10/2014 0810   GLUCOSE 151* 06/24/2014 1422   BUN 24.1 11/10/2014 0810   BUN 13 06/24/2014 1422   CREATININE 1.2* 11/10/2014 0810   CREATININE 1.54* 06/24/2014 1422   CALCIUM 9.7 11/10/2014 0810   CALCIUM 9.1 06/24/2014 1422  PROT 8.0 11/10/2014 0810   PROT 7.5 12/13/2013 2018   ALBUMIN 3.7 11/10/2014 0810   ALBUMIN 3.7 12/13/2013 2018   AST 16 11/10/2014 0810   AST 17 12/13/2013 2018   ALT 15 11/10/2014 0810   ALT 14 12/13/2013 2018   ALKPHOS 114 11/10/2014 0810   ALKPHOS 80 12/13/2013 2018   BILITOT 0.31 11/10/2014 0810   BILITOT 0.3 12/13/2013 2018   GFRNONAA 36* 06/24/2014 1422   GFRAA 41* 06/24/2014 1422    No results found for: SPEP, UPEP  Lab Results  Component Value Date   WBC 7.0 11/10/2014   NEUTROABS 4.9 11/10/2014   HGB 12.3 11/10/2014   HCT 37.8 11/10/2014   MCV 92.2 11/10/2014   PLT 139* 11/10/2014      Chemistry      Component Value Date/Time   NA 140 11/10/2014 0810   NA 138 06/24/2014 1422   K 4.1 11/10/2014 0810   K 4.0 06/24/2014 1422   CL 96* 06/24/2014 1422   CO2 23 11/10/2014 0810   CO2  30 06/24/2014 1422   BUN 24.1 11/10/2014 0810   BUN 13 06/24/2014 1422   CREATININE 1.2* 11/10/2014 0810   CREATININE 1.54* 06/24/2014 1422      Component Value Date/Time   CALCIUM 9.7 11/10/2014 0810   CALCIUM 9.1 06/24/2014 1422   ALKPHOS 114 11/10/2014 0810   ALKPHOS 80 12/13/2013 2018   AST 16 11/10/2014 0810   AST 17 12/13/2013 2018   ALT 15 11/10/2014 0810   ALT 14 12/13/2013 2018   BILITOT 0.31 11/10/2014 0810   BILITOT 0.3 12/13/2013 2018     ASSESSMENT & PLAN:  Adenocarcinoma of upper lobe of right lung She tolerated treatment well without side effects. We will proceed with treatment without dose adjustment. When I see her back next month, I will order PET scan. The patient desires second opinion at [facility] and I will refer her there.  Pancytopenia due to antineoplastic chemotherapy [facility]) This is likely due to recent treatment. The patient denies recent history of bleeding such as epistaxis, hematuria or hematochezia. She is asymptomatic from the low platelet count. I will observe for now.  she does not require transfusion now. I will continue the chemotherapy at current dose without dosage adjustment.  If the thrombocytopenia gets progressive worse in the future, I might have to delay her treatment or adjust the chemotherapy dose.    Essential hypertension  Her blood pressure fluctuated up and down. Recent urinalysis show mild proteinuria. I will continue to monitor her blood pressure carefully.    Neuropathy due to chemotherapeutic drug [facility]) She has mild, grade 1 neuropathy in her feet. It is not debilitating. We will monitor for that carefully while on treatment. Right now, I will proceed with treatment without dose adjustment.   No orders of the defined types were placed in this encounter.   All questions were answered. The patient knows to call the clinic with any problems, questions or concerns. No barriers to learning was detected. I spent 25  minutes counseling the patient face to face. The total time spent in the appointment was 40 minutes and more than 50% was on counseling and review of test results     [facility], [place], MD 11/10/2014 9:36 AM

## 2014-11-10 NOTE — Assessment & Plan Note (Signed)
Her blood pressure fluctuated up and down. Recent urinalysis show mild proteinuria. I will continue to monitor her blood pressure carefully.

## 2014-11-10 NOTE — Telephone Encounter (Signed)
per pof to sch pt appt-gave pt copy of avs °

## 2014-11-10 NOTE — Assessment & Plan Note (Signed)
She has mild, grade 1 neuropathy in her feet. It is not debilitating. We will monitor for that carefully while on treatment. Right now, I will proceed with treatment without dose adjustment.

## 2014-11-10 NOTE — Patient Instructions (Signed)

## 2014-11-10 NOTE — Assessment & Plan Note (Signed)
She tolerated treatment well without side effects. We will proceed with treatment without dose adjustment. When I see her back next month, I will order PET scan. The patient desires second opinion at [facility] and I will refer her there.

## 2014-11-10 NOTE — Assessment & Plan Note (Signed)
This is likely due to recent treatment. The patient denies recent history of bleeding such as epistaxis, hematuria or hematochezia. She is asymptomatic from the low platelet count. I will observe for now.  she does not require transfusion now. I will continue the chemotherapy at current dose without dosage adjustment.  If the thrombocytopenia gets progressive worse in the future, I might have to delay her treatment or adjust the chemotherapy dose.   

## 2014-11-10 NOTE — Patient Instructions (Signed)
[facility] Discharge Instructions for Patients Receiving Chemotherapy  Today you received the following chemotherapy agents taxol/carboplatin  To help prevent nausea and vomiting after your treatment, we encourage you to take your nausea medication as directed   If you develop nausea and vomiting that is not controlled by your nausea medication, call the clinic.   BELOW ARE SYMPTOMS THAT SHOULD BE REPORTED IMMEDIATELY:  *FEVER GREATER THAN 100.5 F  *CHILLS WITH OR WITHOUT FEVER  NAUSEA AND VOMITING THAT IS NOT CONTROLLED WITH YOUR NAUSEA MEDICATION  *UNUSUAL SHORTNESS OF BREATH  *UNUSUAL BRUISING OR BLEEDING  TENDERNESS IN MOUTH AND THROAT WITH OR WITHOUT PRESENCE OF ULCERS  *URINARY PROBLEMS  *BOWEL PROBLEMS  UNUSUAL RASH Items with * indicate a potential emergency and should be followed up as soon as possible.  Feel free to call the clinic you have any questions or concerns. The clinic phone number is [phone].  

## 2014-11-11 ENCOUNTER — Telehealth: Payer: Self-pay | Admitting: *Deleted

## 2014-11-11 ENCOUNTER — Telehealth: Payer: Self-pay | Admitting: Hematology and Oncology

## 2014-11-11 NOTE — Telephone Encounter (Signed)
Per staff message and POF I have scheduled appts. Advised scheduler of appts. JMW  

## 2014-11-11 NOTE — Telephone Encounter (Signed)
Left messaege for patient and gave appt for 11/03 @ 3:40 w/Dr. Karene Fry. Faxed medical records to [facility] [phone].  [phone]

## 2014-11-12 ENCOUNTER — Ambulatory Visit: Payer: No Typology Code available for payment source | Admitting: Physical Therapy

## 2014-11-16 ENCOUNTER — Ambulatory Visit: Payer: No Typology Code available for payment source | Admitting: Physical Therapy

## 2014-11-16 ENCOUNTER — Ambulatory Visit: Payer: No Typology Code available for payment source | Admitting: Emergency Medicine

## 2014-11-19 ENCOUNTER — Ambulatory Visit: Payer: No Typology Code available for payment source | Admitting: Physical Therapy

## 2014-11-24 ENCOUNTER — Other Ambulatory Visit (HOSPITAL_BASED_OUTPATIENT_CLINIC_OR_DEPARTMENT_OTHER): Payer: No Typology Code available for payment source

## 2014-11-24 ENCOUNTER — Ambulatory Visit: Payer: No Typology Code available for payment source

## 2014-11-24 ENCOUNTER — Ambulatory Visit (HOSPITAL_BASED_OUTPATIENT_CLINIC_OR_DEPARTMENT_OTHER): Payer: No Typology Code available for payment source | Admitting: Nurse Practitioner

## 2014-11-24 ENCOUNTER — Ambulatory Visit (HOSPITAL_BASED_OUTPATIENT_CLINIC_OR_DEPARTMENT_OTHER): Payer: No Typology Code available for payment source

## 2014-11-24 VITALS — BP 138/70 | HR 61 | Temp 98.9°F | Resp 22

## 2014-11-24 DIAGNOSIS — R21 Rash and other nonspecific skin eruption: Secondary | ICD-10-CM | POA: Diagnosis not present

## 2014-11-24 DIAGNOSIS — Z5111 Encounter for antineoplastic chemotherapy: Secondary | ICD-10-CM

## 2014-11-24 DIAGNOSIS — C3411 Malignant neoplasm of upper lobe, right bronchus or lung: Secondary | ICD-10-CM

## 2014-11-24 DIAGNOSIS — Z5112 Encounter for antineoplastic immunotherapy: Secondary | ICD-10-CM | POA: Diagnosis not present

## 2014-11-24 DIAGNOSIS — Z95828 Presence of other vascular implants and grafts: Secondary | ICD-10-CM

## 2014-11-24 LAB — CBC WITH DIFFERENTIAL/PLATELET
BASO%: 0.7 % (ref 0.0–2.0)
Basophils Absolute: 0 10*3/uL (ref 0.0–0.1)
EOS%: 3.2 % (ref 0.0–7.0)
Eosinophils Absolute: 0.1 10*3/uL (ref 0.0–0.5)
HEMATOCRIT: 33.8 % — AB (ref 34.8–46.6)
HGB: 11 g/dL — ABNORMAL LOW (ref 11.6–15.9)
LYMPH#: 0.9 10*3/uL (ref 0.9–3.3)
LYMPH%: 24.7 % (ref 14.0–49.7)
MCH: 30.2 pg (ref 25.1–34.0)
MCHC: 32.6 g/dL (ref 31.5–36.0)
MCV: 92.7 fL (ref 79.5–101.0)
MONO#: 0.6 10*3/uL (ref 0.1–0.9)
MONO%: 16.8 % — ABNORMAL HIGH (ref 0.0–14.0)
NEUT%: 54.6 % (ref 38.4–76.8)
NEUTROS ABS: 1.9 10*3/uL (ref 1.5–6.5)
PLATELETS: 216 10*3/uL (ref 145–400)
RBC: 3.64 10*6/uL — ABNORMAL LOW (ref 3.70–5.45)
RDW: 15.5 % — AB (ref 11.2–14.5)
WBC: 3.4 10*3/uL — AB (ref 3.9–10.3)

## 2014-11-24 LAB — COMPREHENSIVE METABOLIC PANEL (CC13)
ALBUMIN: 3.4 g/dL — AB (ref 3.5–5.0)
ALK PHOS: 97 U/L (ref 40–150)
ALT: 16 U/L (ref 0–55)
AST: 15 U/L (ref 5–34)
Anion Gap: 8 mEq/L (ref 3–11)
BUN: 14.3 mg/dL (ref 7.0–26.0)
CO2: 25 mEq/L (ref 22–29)
Calcium: 9.4 mg/dL (ref 8.4–10.4)
Chloride: 110 mEq/L — ABNORMAL HIGH (ref 98–109)
Creatinine: 1.1 mg/dL (ref 0.6–1.1)
EGFR: 55 mL/min/{1.73_m2} — AB (ref >90)
GLUCOSE: 109 mg/dL (ref 70–140)
Potassium: 4.1 mEq/L (ref 3.5–5.1)
SODIUM: 143 meq/L (ref 136–145)
TOTAL PROTEIN: 7.5 g/dL (ref 6.4–8.3)

## 2014-11-24 MED ORDER — SODIUM CHLORIDE 0.9 % IJ SOLN
10.0000 mL | INTRAMUSCULAR | Status: DC | PRN
  Administered 2014-11-24: 10 mL
  Filled 2014-11-24: qty 10

## 2014-11-24 MED ORDER — SODIUM CHLORIDE 0.9 % IV SOLN
15.0000 mg/kg | Freq: Once | INTRAVENOUS | Status: AC
  Administered 2014-11-24: 1500 mg via INTRAVENOUS
  Filled 2014-11-24: qty 60

## 2014-11-24 MED ORDER — HEPARIN SOD (PORK) LOCK FLUSH 100 UNIT/ML IV SOLN
500.0000 [IU] | Freq: Once | INTRAVENOUS | Status: AC | PRN
  Administered 2014-11-24: 500 [IU]
  Filled 2014-11-24: qty 5

## 2014-11-24 MED ORDER — FAMOTIDINE IN NACL 20-0.9 MG/50ML-% IV SOLN
INTRAVENOUS | Status: AC
  Filled 2014-11-24: qty 50

## 2014-11-24 MED ORDER — FAMOTIDINE IN NACL 20-0.9 MG/50ML-% IV SOLN
20.0000 mg | Freq: Once | INTRAVENOUS | Status: AC
  Administered 2014-11-24: 20 mg via INTRAVENOUS

## 2014-11-24 MED ORDER — DIPHENHYDRAMINE HCL 50 MG/ML IJ SOLN
50.0000 mg | Freq: Once | INTRAMUSCULAR | Status: AC
  Administered 2014-11-24: 50 mg via INTRAVENOUS

## 2014-11-24 MED ORDER — SODIUM CHLORIDE 0.9 % IJ SOLN
10.0000 mL | INTRAMUSCULAR | Status: DC | PRN
  Administered 2014-11-24: 10 mL via INTRAVENOUS
  Filled 2014-11-24: qty 10

## 2014-11-24 MED ORDER — LORAZEPAM 2 MG/ML IJ SOLN
INTRAMUSCULAR | Status: AC
  Filled 2014-11-24: qty 1

## 2014-11-24 MED ORDER — LORAZEPAM 2 MG/ML IJ SOLN
0.5000 mg | Freq: Once | INTRAMUSCULAR | Status: AC
  Administered 2014-11-24: 0.5 mg via INTRAVENOUS

## 2014-11-24 MED ORDER — DIPHENHYDRAMINE HCL 50 MG/ML IJ SOLN
INTRAMUSCULAR | Status: AC
  Filled 2014-11-24: qty 1

## 2014-11-24 MED ORDER — SODIUM CHLORIDE 0.9 % IV SOLN
Freq: Once | INTRAVENOUS | Status: AC
  Administered 2014-11-24: 11:00:00 via INTRAVENOUS

## 2014-11-24 MED ORDER — SODIUM CHLORIDE 0.9 % IV SOLN
210.0000 mg | Freq: Once | INTRAVENOUS | Status: AC
  Administered 2014-11-24: 210 mg via INTRAVENOUS
  Filled 2014-11-24: qty 21

## 2014-11-24 MED ORDER — SODIUM CHLORIDE 0.9 % IV SOLN
Freq: Once | INTRAVENOUS | Status: AC
  Administered 2014-11-24: 13:00:00 via INTRAVENOUS
  Filled 2014-11-24: qty 8

## 2014-11-24 MED ORDER — PACLITAXEL CHEMO INJECTION 300 MG/50ML
45.0000 mg/m2 | Freq: Once | INTRAVENOUS | Status: AC
  Administered 2014-11-24: 102 mg via INTRAVENOUS
  Filled 2014-11-24: qty 17

## 2014-11-24 NOTE — Progress Notes (Signed)
Patient has swollen red area on inside of right ankle.  Will have Selena Lesser NP come and assess area.

## 2014-11-24 NOTE — Patient Instructions (Signed)

## 2014-11-24 NOTE — Patient Instructions (Signed)
[facility] Discharge Instructions for Patients Receiving Chemotherapy  Today you received the following chemotherapy agents Avastin, Taxol and Carboplatin.  To help prevent nausea and vomiting after your treatment, we encourage you to take your nausea medication as prescribed.   If you develop nausea and vomiting that is not controlled by your nausea medication, call the clinic.   BELOW ARE SYMPTOMS THAT SHOULD BE REPORTED IMMEDIATELY:  *FEVER GREATER THAN 100.5 F  *CHILLS WITH OR WITHOUT FEVER  NAUSEA AND VOMITING THAT IS NOT CONTROLLED WITH YOUR NAUSEA MEDICATION  *UNUSUAL SHORTNESS OF BREATH  *UNUSUAL BRUISING OR BLEEDING  TENDERNESS IN MOUTH AND THROAT WITH OR WITHOUT PRESENCE OF ULCERS  *URINARY PROBLEMS  *BOWEL PROBLEMS  UNUSUAL RASH Items with * indicate a potential emergency and should be followed up as soon as possible.  Feel free to call the clinic you have any questions or concerns. The clinic phone number is [phone].  Please show the [place] at check-in to the Emergency Department and triage nurse.

## 2014-11-25 ENCOUNTER — Encounter: Payer: Self-pay | Admitting: Emergency Medicine

## 2014-11-25 ENCOUNTER — Ambulatory Visit (INDEPENDENT_AMBULATORY_CARE_PROVIDER_SITE_OTHER): Payer: No Typology Code available for payment source | Admitting: Emergency Medicine

## 2014-11-25 ENCOUNTER — Other Ambulatory Visit: Payer: Self-pay | Admitting: Hematology and Oncology

## 2014-11-25 VITALS — BP 138/76 | HR 94 | Ht 74.0 in | Wt 237.0 lb

## 2014-11-25 DIAGNOSIS — J449 Chronic obstructive pulmonary disease, unspecified: Secondary | ICD-10-CM | POA: Diagnosis not present

## 2014-11-25 DIAGNOSIS — R059 Cough, unspecified: Secondary | ICD-10-CM

## 2014-11-25 DIAGNOSIS — C3411 Malignant neoplasm of upper lobe, right bronchus or lung: Secondary | ICD-10-CM

## 2014-11-25 DIAGNOSIS — R05 Cough: Secondary | ICD-10-CM | POA: Diagnosis not present

## 2014-11-25 MED ORDER — ALBUTEROL SULFATE HFA 108 (90 BASE) MCG/ACT IN AERS: 2.0000 | INHALATION_SPRAY | RESPIRATORY_TRACT | Status: DC | PRN

## 2014-11-25 MED ORDER — FLUTICASONE PROPIONATE 50 MCG/ACT NA SUSP: 2.0000 | Freq: Every day | NASAL | Status: DC

## 2014-11-25 NOTE — Assessment & Plan Note (Signed)
Now dry cough suspect is related to rhinitis and allergies. Her bronchitic symptoms have improved since last time. Start fluticasone nasal spray

## 2014-11-25 NOTE — Assessment & Plan Note (Signed)
Following with oncology and completing chemotherapy

## 2014-11-25 NOTE — Progress Notes (Signed)
Subjective:    Patient ID: Jasmine Grant, female    DOB: 04-Apr-1955, 59 y.o.   MRN: [id number]  Cough Associated symptoms include shortness of breath. Pertinent negatives include no chest pain, chills, ear pain, fever, headaches, postnasal drip, rash, rhinorrhea or sore throat.   59 year old former smoker (30 pk-yrs) with a diagnosis of stage IV adenocarcinoma of the lung made in November 2015. The presumed primary lesion was a large right upper lobe mass compared by metastatic disease to bone and pretracheal lymph nodes. Foundation 1 testing did not show a treatable mutation. She received radiation therapy to the mediastinum, bone. She underwent carboplatin and Taxol with good response until a PET scan 07/07/14 showed progression of disease. Palliative chemotherapy was initiated at that time, as well as further radiation therapy to bone in July 2016. She has experienced progressive cough going back to her initial XRT in February, productive of yellow phlegm. Has been associated with some progressive dyspnea. Could be limited by even walking through the house. She is able to push through the SOB without stopping.  Last treated with levofloxacin was last month > it did help her some. She denies any pleuritic pain but she does have some "rawness" in her chest especially with heavy coughing.    A PET scan was performed yesterday 10/05/14 that I have personally reviewed. This shows progression of disease including enlargement of hypermetabolic pulmonary nodules, some evidence for progressive pleural disease and some response to radiation therapy and a C4 bony lesion.   ROV 11/25/14 -- follow-up visit for cough and probable COPD superimposed on stage IV adenocarcinoma of the lung. She is currently completing chemotherapy with Dr. Alvy Bimler. At her last visit she started [facility] and she appears to have benefited in that cough is better and less SOB. She does still have some cough that she ascribes to some increase  in her allergy symptoms since last time. She is doing nasal saline spray, not washes.    Review of Systems  Constitutional: Negative for fever, chills and unexpected weight change.  HENT: Negative for congestion, dental problem, ear pain, nosebleeds, postnasal drip, rhinorrhea, sinus pressure, sneezing, sore throat, trouble swallowing and voice change.   Eyes: Negative for visual disturbance.  Respiratory: Positive for cough and shortness of breath. Negative for choking.   Cardiovascular: Negative for chest pain and leg swelling.  Gastrointestinal: Negative for vomiting, abdominal pain and diarrhea.  Genitourinary: Negative for difficulty urinating.  Musculoskeletal: Negative for arthralgias.  Skin: Negative for rash.  Neurological: Negative for tremors, syncope and headaches.  Hematological: Does not bruise/bleed easily.    Past Medical History  Diagnosis Date  . Panic attack   . Hx of thyroid cancer     10 years ago  . Insomnia 12/29/2013  . S/P radiation therapy completed 12/16/13-02/05/14    RULL lung  . Lung cancer ([place]) 12/14/13    Adenocarcinoma of right upper lobe mediastinal mass and bone   . Arthritis 04/27/2014  . Essential hypertension 04/27/2014  . 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 Dysuria 11/03/2014     Family History  Problem Relation Age of Onset  . Heart disease Mother   . Heart disease Father   . Thyroid cancer Father      Social History   Social History  . Marital Status: Legally Separated    Spouse Name: N/A  . Number of Children: N/A  . Years of Education: N/A   Occupational History  . Not on file.  Social History Main Topics  . Smoking status: Former Smoker -- 1.00 packs/day for 30 years    Types: Cigarettes    Quit date: 01/29/2009  . Smokeless tobacco: Never Used  . Alcohol Use: 0.0 oz/week    0 Standard drinks or equivalent per week  . Drug Use: No  . Sexual Activity: Not on file   Other Topics Concern  . Not on file   Social History  Narrative     Allergies  Allergen Reactions  . Codeine Nausea And Vomiting and Other (See Comments)    Any medication that is a codeine derivative like Hydrocodone, etc.  . Penicillins     Unknown reaction as a child  . Sulfa Antibiotics Nausea And Vomiting     Outpatient Prescriptions Prior to Visit  Medication Sig Dispense Refill  . amLODipine (NORVASC) 10 MG tablet TAKE 1 TABLET BY MOUTH EVERY DAY 30 tablet 0  . Fluticasone Furoate-Vilanterol (BREO ELLIPTA) 200-25 MCG/INH AEPB Inhale 1 puff into the lungs daily.     Marland Kitchen levothyroxine (SYNTHROID, LEVOTHROID) 200 MCG tablet TAKE 1 TABLET BY MOUTH DAILY BEFORE BREAKFAST. 30 tablet 2  . LORazepam (ATIVAN) 0.5 MG tablet Take 1 tablet (0.5 mg total) by mouth every 8 (eight) hours as needed for anxiety (or nausea). 90 tablet 0  . mirtazapine (REMERON) 15 MG tablet Take 1 tablet (15 mg total) by mouth at bedtime as needed (For sleep.). 30 tablet 3  . ondansetron (ZOFRAN) 8 MG tablet Take 1 tablet (8 mg total) by mouth every 6 (six) hours as needed for nausea. 60 tablet 0  . ranitidine (ZANTAC) 150 MG capsule Take 1 capsule (150 mg total) by mouth every evening. 30 capsule 6  . BESIVANCE 0.6 % SUSP Place 1 drop into the right eye 3 (three) times daily.  1  . prochlorperazine (COMPAZINE) 10 MG tablet Take 1 tablet (10 mg total) by mouth every 6 (six) hours as needed for nausea or vomiting. 30 tablet 1   No facility-administered medications prior to visit.         Objective:   Physical Exam Filed Vitals:   11/25/14 0954  BP: 138/76  Pulse: 94  Height: '6\' 2"'$  (1.88 m)  Weight: 237 lb (107.502 kg)  SpO2: 96%   Gen: Pleasant, well-nourished, in no distress,  normal affect  ENT: No lesions,  mouth clear,  oropharynx clear, no postnasal drip  Neck: No JVD, no TMG, no carotid bruits  Lungs: No use of accessory muscles, few inspiratory crackles at the right lateral base  Cardiovascular: RRR, heart sounds normal, no murmur or gallops, no  peripheral edema  Musculoskeletal: No deformities, no cyanosis or clubbing  Neuro: alert, non focal  Skin: Warm, no lesions or rashes    PET scan 10/05/14 --  COMPARISON: 07/07/2014  FINDINGS: NECK  A peripherally calcified lesion in the left carotid space measures a S.U.V. max of 9.5. Increased from a S.U.V. max of 3.2 on the prior exam. This has been present on multiple priors.  CHEST  Thoracic adenopathy, with a mass in the subcarinal station, extending into the azygoesophageal recess. This measures 2.8 cm and a S.U.V. max of 12.9. On the prior exam, this measured 12.5 cm and a S.U.V. max of 9.8.  Hypermetabolic bilateral pulmonary nodules. Index right upper lobe nodule measures 11 mm and a S.U.V. max of 10.7. This is enlarged from 7 mm on the prior exam.  Trace right pleural thickening with hypermetabolism, new. This measures a S.U.V.  max of 3.1 on image 74. A more nodular area of right-sided pleural-based irregularity corresponds to hypermetabolism, measuring a S.U.V. max of 6.3.  ABDOMEN/PELVIS  No extraosseous hypermetabolism within the abdomen.  SKELETON  Left-sided C4 hypermetabolism measures a S.U.V. max of 7.3. On the prior exam, this measures a S.U.V. max of 12.4.  Hypermetabolism about the spinous processes at the L1-2 level could be degenerative or malignant. This measures a S.U.V. max of 4.1, and is new.  CT IMAGES PERFORMED FOR ATTENUATION CORRECTION  Right paratracheal soft tissue mass/adenopathy. 2.3 cm on image 57 versus 2.1 cm on the prior. Index left lower lobe pulmonary nodule measures 1.7 cm on image 87 versus 1.4 cm on the prior. Low-density hepatic lesions are again identified, likely cysts. Normal adrenal glands. Periampullary duodenal diverticulum. 8 mm right renal artery aneurysm.  IMPRESSION: 1. Progression of pulmonary and thoracic nodal metastasis. Minimal right pleural thickening and nodularity with  hypermetabolism are new, suspicious for pleural metastasis. 2. Response to therapy of a left-sided C4 metastasis. New hypermetabolism in the lumbar spine could be degenerative or metastatic. 3. No extraosseous metastasis within the abdomen or pelvis.       Assessment & Plan:  Adenocarcinoma of upper lobe of right lung Following with oncology and completing chemotherapy  Cough Now dry cough suspect is related to rhinitis and allergies. Her bronchitic symptoms have improved since last time. Start fluticasone nasal spray  COPD (chronic obstructive pulmonary disease) ([place]) Her principle symptom was a bronchitic cough and this has improved since last time. Unclear whether the Breo plated large role in this improvement.. We will try stopping this medication to see if she misses it. If so then we will restart. We will teach her how to use albuterol as needed.

## 2014-11-25 NOTE — Patient Instructions (Addendum)
We will do a trial off your Breo to see if your symptoms change - if so then we will restart  We will start fluticasone 2 sprays each nostril once a day.  We will start albuterol 2 puffs up to every 4 hours if needed for shortness of breath.  Follow with Dr Alvy Bimler as planned.  Follow with Dr Lamonte Sakai in 4 months or sooner if you have any problems.

## 2014-11-25 NOTE — Assessment & Plan Note (Signed)
Her principle symptom was a bronchitic cough and this has improved since last time. Unclear whether the Breo plated large role in this improvement.. We will try stopping this medication to see if she misses it. If so then we will restart. We will teach her how to use albuterol as needed.

## 2014-11-26 ENCOUNTER — Telehealth: Payer: Self-pay | Admitting: *Deleted

## 2014-11-26 ENCOUNTER — Encounter: Payer: Self-pay | Admitting: Nurse Practitioner

## 2014-11-26 DIAGNOSIS — R21 Rash and other nonspecific skin eruption: Secondary | ICD-10-CM | POA: Insufficient documentation

## 2014-11-26 NOTE — Progress Notes (Signed)
SYMPTOM MANAGEMENT CLINIC   HPI: Jasmine Grant 59 y.o. female diagnosed with lung cancer.  Currently undergoing carboplatin/Taxol/Avastin chemotherapy regimen.  Patient reports recent development of some right ankle rash. She denies any specific itching or pain to the rash.  She states that she does continually have very dry skin.  She denies any other rash.  She also denies any recent fevers or chills.  On exam.  It does appear the patient has a petechial-type rash to the right medial ankle area; with some radiation of the petechial rash going up the medial calf as well.  There is no edema, warmth, tenderness, or red streaks to the site.   Patient's platelet count was normal at 216 today. Advised patient that the petechial rash could be secondary to her chemotherapy treatments; but advised patient to keep a close eye on the rash; and let us know if it does worsen in any way.  HPI  ROS  Past Medical History  Diagnosis Date  . Panic attack   . Hx of thyroid cancer     10 years ago  . Insomnia 12/29/2013  . S/P radiation therapy completed 12/16/13-02/05/14    RULL lung  . Lung cancer ([place]) 12/14/13    Adenocarcinoma of right upper lobe mediastinal mass and bone   . Arthritis 04/27/2014  . Essential hypertension 04/27/2014  . 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 Dysuria 11/03/2014    Past Surgical History  Procedure Laterality Date  . Thyroidectomy      has Epidural mass; SVC syndrome; Pathologic compression fracture of spine; Generalized anxiety disorder; Hypothyroidism; Overweight (BMI 25.0-29.9); Elevated blood pressure; Nausea with vomiting; Adenocarcinoma of upper lobe of right lung; Constipation due to opioid therapy; Insomnia; Hypersensitivity reaction; Esophagitis; Leukopenia due to antineoplastic chemotherapy; Radiation-induced dermatitis; Cyst of skin; Other fatigue; Anemia due to antineoplastic chemotherapy; Cellulitis; Arthritis; Bilateral leg edema; Sinusitis, chronic;  Essential hypertension; Other constipation; Prerenal renal failure; Right shoulder pain; Protein calorie malnutrition ([place]); Elevated serum creatinine; Lymphedema of face; Cough; Dysuria; Pancytopenia due to antineoplastic chemotherapy ([place]); Neuropathy due to chemotherapeutic drug ([place]); COPD (chronic obstructive pulmonary disease) ([place]); and Rash on her problem list.    is allergic to codeine; penicillins; and sulfa antibiotics.    Medication List       This list is accurate as of: 11/24/14 11:59 PM.  Always use your most recent med list.               amLODipine 10 MG tablet  Commonly known as:  NORVASC  TAKE 1 TABLET BY MOUTH EVERY DAY     BESIVANCE 0.6 % Susp  Generic drug:  Besifloxacin HCl  Place 1 drop into the right eye 3 (three) times daily.     BREO ELLIPTA 200-25 MCG/INH Aepb  Generic drug:  Fluticasone Furoate-Vilanterol  Inhale 1 puff into the lungs daily.     levothyroxine 200 MCG tablet  Commonly known as:  SYNTHROID, LEVOTHROID  TAKE 1 TABLET BY MOUTH DAILY BEFORE BREAKFAST.     LORazepam 0.5 MG tablet  Commonly known as:  ATIVAN  Take 1 tablet (0.5 mg total) by mouth every 8 (eight) hours as needed for anxiety (or nausea).     mirtazapine 15 MG tablet  Commonly known as:  REMERON  Take 1 tablet (15 mg total) by mouth at bedtime as needed (For sleep.).     ondansetron 8 MG tablet  Commonly known as:  ZOFRAN  Take 1 tablet (8 mg total) by mouth  every 6 (six) hours as needed for nausea.     prochlorperazine 10 MG tablet  Commonly known as:  COMPAZINE  Take 1 tablet (10 mg total) by mouth every 6 (six) hours as needed for nausea or vomiting.     ranitidine 150 MG capsule  Commonly known as:  ZANTAC  Take 1 capsule (150 mg total) by mouth every evening.         PHYSICAL EXAMINATION  Oncology Vitals 11/25/2014 11/24/2014 11/24/2014 11/10/2014 11/03/2014 10/20/2014 10/13/2014  Height 188 cm - - 188 cm - 188 cm -  Weight 107.502 kg - - 103.103 kg -  101.334 kg -  Weight (lbs) 237 lbs - - 227 lbs 5 oz - 223 lbs 6 oz -  BMI (kg/m2) 30.43 kg/m2 - - 29.18 kg/m2 - 28.68 kg/m2 -  Temp - - 98.9 98.2 99.2 98.2 98  Pulse 94 61 74 86 67 61 65  Resp - - 22 19 18 18 18   SpO2 96 - 100 98 100 99 94  BSA (m2) 2.37 m2 - - 2.32 m2 - 2.3 m2 -   BP Readings from Last 3 Encounters:  11/25/14 138/76  11/24/14 138/70  11/10/14 150/79    Physical Exam  Constitutional: She is oriented to person, place, and time and well-developed, well-nourished, and in no distress.  HENT:  Head: Normocephalic and atraumatic.  Eyes: Conjunctivae and EOM are normal. Pupils are equal, round, and reactive to light. Right eye exhibits no discharge. Left eye exhibits no discharge. No scleral icterus.  Neck: Normal range of motion.  Pulmonary/Chest: Effort normal. No respiratory distress.  Musculoskeletal: Normal range of motion. She exhibits no edema or tenderness.  Neurological: She is alert and oriented to person, place, and time.  Skin: Skin is warm and dry. Rash noted. There is erythema. No pallor.  On exam.  It does appear the patient has a petechial-type rash to the right medial ankle area; with some radiation of the petechial rash going up the medial calf as well.  There is no edema, warmth, tenderness, or red streaks to the site.     Psychiatric: Affect normal.  Nursing note and vitals reviewed.   LABORATORY DATA:. Appointment on 11/24/2014  Component Date Value Ref Range Status  . WBC 11/24/2014 3.4* 3.9 - 10.3 10e3/uL Final  . NEUT# 11/24/2014 1.9  1.5 - 6.5 10e3/uL Final  . HGB 11/24/2014 11.0* 11.6 - 15.9 g/dL Final  . HCT 11/24/2014 33.8* 34.8 - 46.6 % Final  . Platelets 11/24/2014 216  145 - 400 10e3/uL Final  . MCV 11/24/2014 92.7  79.5 - 101.0 fL Final  . MCH 11/24/2014 30.2  25.1 - 34.0 pg Final  . MCHC 11/24/2014 32.6  31.5 - 36.0 g/dL Final  . RBC 11/24/2014 3.64* 3.70 - 5.45 10e6/uL Final  . RDW 11/24/2014 15.5* 11.2 - 14.5 % Final  . lymph#  11/24/2014 0.9  0.9 - 3.3 10e3/uL Final  . MONO# 11/24/2014 0.6  0.1 - 0.9 10e3/uL Final  . Eosinophils Absolute 11/24/2014 0.1  0.0 - 0.5 10e3/uL Final  . Basophils Absolute 11/24/2014 0.0  0.0 - 0.1 10e3/uL Final  . NEUT% 11/24/2014 54.6  38.4 - 76.8 % Final  . LYMPH% 11/24/2014 24.7  14.0 - 49.7 % Final  . MONO% 11/24/2014 16.8* 0.0 - 14.0 % Final  . EOS% 11/24/2014 3.2  0.0 - 7.0 % Final  . BASO% 11/24/2014 0.7  0.0 - 2.0 % Final  . Sodium 11/24/2014 143  136 - 145 mEq/L Final  . Potassium 11/24/2014 4.1  3.5 - 5.1 mEq/L Final  . Chloride 11/24/2014 110* 98 - 109 mEq/L Final  . CO2 11/24/2014 25  22 - 29 mEq/L Final  . Glucose 11/24/2014 109  70 - 140 mg/dl Final   Glucose reference range is for nonfasting patients. Fasting glucose reference range is 70- 100.  Marland Kitchen BUN 11/24/2014 14.3  7.0 - 26.0 mg/dL Final  . Creatinine 11/24/2014 1.1  0.6 - 1.1 mg/dL Final  . Total Bilirubin 11/24/2014 <0.30  0.20 - 1.20 mg/dL Final  . Alkaline Phosphatase 11/24/2014 97  40 - 150 U/L Final  . AST 11/24/2014 15  5 - 34 U/L Final  . ALT 11/24/2014 16  0 - 55 U/L Final  . Total Protein 11/24/2014 7.5  6.4 - 8.3 g/dL Final  . Albumin 11/24/2014 3.4* 3.5 - 5.0 g/dL Final  . Calcium 11/24/2014 9.4  8.4 - 10.4 mg/dL Final  . Anion Gap 11/24/2014 8  3 - 11 mEq/L Final  . EGFR 11/24/2014 55* >90 ml/min/1.73 m2 Final   eGFR is calculated using the CKD-EPI Creatinine Equation (2009)   Right ankle:       RADIOGRAPHIC STUDIES: No results found.  ASSESSMENT/PLAN:    Adenocarcinoma of upper lobe of right lung  Patient presents to the [facility] today to receive cycle 5 of her carboplatin/Taxol/Avastin chemotherapy regimen. She states that she has been doing fairly well; with the exception of recent development right ankle rash.  She has chronically dry skin; but notes that this rash has been present for approximately 24 hours.  She denies any pain to the rash.  She also denies any recent fevers or  chills.   Blood counts obtained today reveal a WBC of 3.4, ANC 1.9, hemoglobin 11.0, and platelet count 216.   Patient is scheduled to follow-up with her pulmonologist on 11/25/2014. She is scheduled to return on 12/01/2014 for labs, office, and chemotherapy.  Rash  Patient reports recent development of some right ankle rash. She denies any specific itching or pain to the rash.  She states that she does continually have very dry skin.  She denies any other rash.  She also denies any recent fevers or chills.  On exam.  It does appear the patient has a petechial-type rash to the right medial ankle area; with some radiation of the petechial rash going up the medial calf as well.  There is no edema, warmth, tenderness, or red streaks to the site.   Patient's platelet count was normal at 216 today. Advised patient that the petechial rash could be secondary to her chemotherapy treatments; but advised patient to keep a close eye on the rash; and let us know if it does worsen in any way.  Patient stated understanding of all instructions; and was in agreement with this plan of care. The patient knows to call the clinic with any problems, questions or concerns.   Review/collaboration with Dr. Alvy Bimler regarding all aspects of patient's visit today.   Total time spent with patient was 25 minutes;  with greater than 75 percent of that time spent in face to face counseling regarding patient's symptoms,  and coordination of care and follow up.  Disclaimer:This dictation was prepared with Dragon/digital dictation along with [employer]. Any transcriptional errors that result from this process are unintentional.  Drue Second, NP 11/26/2014

## 2014-11-26 NOTE — Telephone Encounter (Signed)
Electronic refill request for Ativan ok by Dr. Blair Promise,   Rx refill called into [facility] in [place] refill line.   LVM for pt informing of refill called into [facility].  Please call us back if any questions.

## 2014-11-26 NOTE — Assessment & Plan Note (Signed)
Patient presents to the [facility] today to receive cycle 5 of her carboplatin/Taxol/Avastin chemotherapy regimen. She states that she has been doing fairly well; with the exception of recent development right ankle rash.  She has chronically dry skin; but notes that this rash has been present for approximately 24 hours.  She denies any pain to the rash.  She also denies any recent fevers or chills.   Blood counts obtained today reveal a WBC of 3.4, ANC 1.9, hemoglobin 11.0, and platelet count 216.   Patient is scheduled to follow-up with her pulmonologist on 11/25/2014. She is scheduled to return on 12/01/2014 for labs, office, and chemotherapy.

## 2014-11-26 NOTE — Assessment & Plan Note (Signed)
Patient reports recent development of some right ankle rash. She denies any specific itching or pain to the rash.  She states that she does continually have very dry skin.  She denies any other rash.  She also denies any recent fevers or chills.  On exam.  It does appear the patient has a petechial-type rash to the right medial ankle area; with some radiation of the petechial rash going up the medial calf as well.  There is no edema, warmth, tenderness, or red streaks to the site.   Patient's platelet count was normal at 216 today. Advised patient that the petechial rash could be secondary to her chemotherapy treatments; but advised patient to keep a close eye on the rash; and let us know if it does worsen in any way.

## 2014-11-29 ENCOUNTER — Telehealth: Payer: Self-pay | Admitting: *Deleted

## 2014-11-29 ENCOUNTER — Ambulatory Visit: Payer: No Typology Code available for payment source | Admitting: Physical Therapy

## 2014-11-29 DIAGNOSIS — I890 Lymphedema, not elsewhere classified: Secondary | ICD-10-CM

## 2014-11-29 DIAGNOSIS — R29898 Other symptoms and signs involving the musculoskeletal system: Secondary | ICD-10-CM

## 2014-11-29 NOTE — Therapy (Addendum)
[place], [place], [address] Phone: [phone]   Fax:  [phone]  Physical Therapy Treatment  Patient Details  Name: Jasmine Grant MRN: [id number] Date of Birth: [birth year] No Data Recorded  Encounter Date: 11/29/2014      PT End of Session - 11/29/14 1714    Visit Number 5   Number of Visits 9   Date for PT Re-Evaluation 11/12/14   PT Start Time 4193   PT Stop Time 1606   PT Time Calculation (min) 43 min      Past Medical History  Diagnosis Date  . Panic attack   . Hx of thyroid cancer     10 years ago  . Insomnia 12/29/2013  . S/P radiation therapy completed 12/16/13-02/05/14    RULL lung  . 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 Arthritis 04/27/2014  . Essential hypertension 04/27/2014  . 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 Dysuria 11/03/2014    Past Surgical History  Procedure Laterality Date  . Thyroidectomy      There were no vitals filed for this visit.  Visit Diagnosis:  Acquired lymphedema  Tightness of neck      Subjective Assessment - 11/29/14 1525    Subjective "I'm tired.  This chemo is kicking my butt."   Currently in Pain? Yes   Pain Score 4    Pain Location Other (Comment)  upper traps   Pain Orientation Right;Left   Pain Descriptors / Indicators Aching   Aggravating Factors  can't turn head   Pain Relieving Factors unsure   Effect of Pain on Daily Activities can't turn head               LYMPHEDEMA/ONCOLOGY QUESTIONNAIRE - 11/29/14 1529    Head and Neck   4 cm superior to sternal notch around neck 43 cm   6 cm superior to sternal notch around neck 44.5 cm   8 cm superior to sternal notch around neck 46.1 cm                  [facility] Adult PT Treatment/Exercise - 11/29/14 0001    Manual Therapy   Manual Therapy Other (comment)   Edema Management circumference measurements taken  before manual lymph drainage was performed.   Manual Lymphatic Drainage (MLD) short neck, bilateral axillae, bilateral shoulder collectors; posterolateral neck, lateral neck, anterolateral neck, and anterior neck; chin, cheeks, and area betwee ears and eyes.     Other Manual Therapy In prone, soft tissue work to neck and upper back for tissue relaxation.                        Long Term Clinic Goals - 11/05/14 1300    CC Long Term Goal  #1   Title reduce neck circumference at 4 cm. proximal to sternal notch to 42.5 cm.   Status On-going   CC Long Term Goal  #2   Title Pt. will be knowledgeable about lymphedema self-care, including manual lymph drainage and use of compression   Status Partially Met            Plan - 11/29/14 1714    Clinical Impression Statement Little change in neck circumference measurements,which is good, given that patient has not been to therapy in a couple of weeks and she has been receiving steroids with her chemotherapy.  Very tight  at both upper traps, especially right; she did note benefit of soft tissue work to this area.   Pt will benefit from skilled therapeutic intervention in order to improve on the following deficits Increased edema;Increased fascial restricitons   Rehab Potential Good   Clinical Impairments Affecting Rehab Potential metastatic cancer; currently undergoing chemotherapy   PT Frequency 2x / week   PT Duration 4 weeks   PT Treatment/Interventions Manual lymph drainage;Manual techniques   PT Next Visit Plan Show pictures and samples of manufactured neck compression garments; continue manual lymph drainage and soft tissue work.     Consulted and Agree with Plan of Care Patient        Problem List Patient Active Problem List   Diagnosis Date Noted  . Rash 11/26/2014  . COPD (chronic obstructive pulmonary disease) ([place]) 11/25/2014  . Pancytopenia due to antineoplastic chemotherapy ([place]) 11/10/2014  . Neuropathy due to  chemotherapeutic drug ([place]) 11/10/2014  . Dysuria 11/03/2014  . Cough 10/06/2014  . 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 Protein calorie malnutrition ([place]) 08/26/2014  . Elevated serum creatinine 08/26/2014  . Right shoulder pain 06/17/2014  . 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 Arthritis 04/27/2014  . Bilateral leg edema 04/27/2014  . Sinusitis, chronic 04/27/2014  . Essential hypertension 04/27/2014  . Other constipation 04/27/2014  . Cellulitis 04/20/2014  . Anemia due to antineoplastic chemotherapy 03/16/2014  . Cyst of skin 02/26/2014  . Other fatigue 02/26/2014  . Leukopenia due to antineoplastic chemotherapy 01/28/2014  . Radiation-induced dermatitis 01/28/2014  . Esophagitis 01/07/2014  . Hypersensitivity reaction 01/04/2014  . Insomnia 12/29/2013  . Constipation due to opioid therapy 12/17/2013  . Adenocarcinoma of upper lobe of right lung   . Nausea with vomiting 12/16/2013  . Elevated blood pressure 12/15/2013  . Generalized anxiety disorder 12/14/2013  . Hypothyroidism 12/14/2013  . Overweight (BMI 25.0-29.9) 12/14/2013  . Epidural mass 12/13/2013  . 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 Pathologic compression fracture of spine     SALISBURY,[name] 11/29/2014, 5:17 PM  [place], [place], [address] Phone: [phone]   Fax:  [phone]  Name: TRINITI GRUETZMACHER MRN: [id number] Date of Birth: [birth year]    Serafina Royals, PT 11/29/2014 5:17 PM   PHYSICAL THERAPY DISCHARGE SUMMARY  Visits from Start of Care: 5  Current functional level related to goals / functional outcomes: Partially met as noted above.   Remaining deficits: Swelling will continue to require management.   Education / Equipment: lymphedema self-care Plan: Patient agrees to discharge.  Patient goals were partially met. Patient is being discharged due to the patient's request.  ?????    Serafina Royals,  PT 01/11/2015 2:12 PM

## 2014-11-29 NOTE — Telephone Encounter (Signed)
LM for rtn call- checking status of rash following [facility] visit. Advised to return call with any concerns or questions.

## 2014-12-01 ENCOUNTER — Other Ambulatory Visit (HOSPITAL_BASED_OUTPATIENT_CLINIC_OR_DEPARTMENT_OTHER): Payer: No Typology Code available for payment source

## 2014-12-01 ENCOUNTER — Telehealth: Payer: Self-pay | Admitting: *Deleted

## 2014-12-01 ENCOUNTER — Encounter: Payer: Self-pay | Admitting: Hematology and Oncology

## 2014-12-01 ENCOUNTER — Telehealth: Payer: Self-pay | Admitting: Hematology and Oncology

## 2014-12-01 ENCOUNTER — Ambulatory Visit: Payer: No Typology Code available for payment source

## 2014-12-01 ENCOUNTER — Ambulatory Visit (HOSPITAL_BASED_OUTPATIENT_CLINIC_OR_DEPARTMENT_OTHER): Payer: No Typology Code available for payment source | Admitting: Hematology and Oncology

## 2014-12-01 ENCOUNTER — Ambulatory Visit (HOSPITAL_BASED_OUTPATIENT_CLINIC_OR_DEPARTMENT_OTHER): Payer: No Typology Code available for payment source

## 2014-12-01 VITALS — BP 151/68 | HR 69 | Temp 98.5°F | Resp 18 | Ht 74.0 in | Wt 234.7 lb

## 2014-12-01 DIAGNOSIS — G620 Drug-induced polyneuropathy: Secondary | ICD-10-CM

## 2014-12-01 DIAGNOSIS — R809 Proteinuria, unspecified: Secondary | ICD-10-CM

## 2014-12-01 DIAGNOSIS — C3411 Malignant neoplasm of upper lobe, right bronchus or lung: Secondary | ICD-10-CM

## 2014-12-01 DIAGNOSIS — T451X5A Adverse effect of antineoplastic and immunosuppressive drugs, initial encounter: Secondary | ICD-10-CM

## 2014-12-01 DIAGNOSIS — D61810 Antineoplastic chemotherapy induced pancytopenia: Secondary | ICD-10-CM

## 2014-12-01 DIAGNOSIS — Z95828 Presence of other vascular implants and grafts: Secondary | ICD-10-CM

## 2014-12-01 DIAGNOSIS — I10 Essential (primary) hypertension: Secondary | ICD-10-CM | POA: Diagnosis not present

## 2014-12-01 DIAGNOSIS — Z5111 Encounter for antineoplastic chemotherapy: Secondary | ICD-10-CM

## 2014-12-01 DIAGNOSIS — D61818 Other pancytopenia: Secondary | ICD-10-CM | POA: Diagnosis not present

## 2014-12-01 LAB — COMPREHENSIVE METABOLIC PANEL (CC13)
ALBUMIN: 3.4 g/dL — AB (ref 3.5–5.0)
ALK PHOS: 87 U/L (ref 40–150)
ALT: 16 U/L (ref 0–55)
AST: 16 U/L (ref 5–34)
Anion Gap: 7 mEq/L (ref 3–11)
BUN: 18.7 mg/dL (ref 7.0–26.0)
CALCIUM: 9.2 mg/dL (ref 8.4–10.4)
CO2: 25 mEq/L (ref 22–29)
Chloride: 109 mEq/L (ref 98–109)
Creatinine: 1.1 mg/dL (ref 0.6–1.1)
EGFR: 57 mL/min/{1.73_m2} — AB (ref >90)
Glucose: 128 mg/dl (ref 70–140)
POTASSIUM: 4 meq/L (ref 3.5–5.1)
Sodium: 140 mEq/L (ref 136–145)
Total Bilirubin: 0.31 mg/dL (ref 0.20–1.20)
Total Protein: 7.3 g/dL (ref 6.4–8.3)

## 2014-12-01 LAB — CBC WITH DIFFERENTIAL/PLATELET
BASO%: 0.7 % (ref 0.0–2.0)
BASOS ABS: 0 10*3/uL (ref 0.0–0.1)
EOS ABS: 0 10*3/uL (ref 0.0–0.5)
EOS%: 1.6 % (ref 0.0–7.0)
HEMATOCRIT: 32.8 % — AB (ref 34.8–46.6)
HEMOGLOBIN: 10.8 g/dL — AB (ref 11.6–15.9)
LYMPH#: 0.8 10*3/uL — AB (ref 0.9–3.3)
LYMPH%: 25 % (ref 14.0–49.7)
MCH: 30.4 pg (ref 25.1–34.0)
MCHC: 32.8 g/dL (ref 31.5–36.0)
MCV: 92.6 fL (ref 79.5–101.0)
MONO#: 0.4 10*3/uL (ref 0.1–0.9)
MONO%: 12.6 % (ref 0.0–14.0)
NEUT#: 1.8 10*3/uL (ref 1.5–6.5)
NEUT%: 60.1 % (ref 38.4–76.8)
Platelets: 177 10*3/uL (ref 145–400)
RBC: 3.54 10*6/uL — ABNORMAL LOW (ref 3.70–5.45)
RDW: 16.6 % — AB (ref 11.2–14.5)
WBC: 3.1 10*3/uL — ABNORMAL LOW (ref 3.9–10.3)

## 2014-12-01 LAB — UA PROTEIN, DIPSTICK - CHCC: Protein, ur: 30 mg/dL

## 2014-12-01 MED ORDER — LORAZEPAM 2 MG/ML IJ SOLN
0.5000 mg | Freq: Once | INTRAMUSCULAR | Status: AC
Start: 2014-12-01 — End: 2014-12-01
  Administered 2014-12-01: 0.5 mg via INTRAVENOUS

## 2014-12-01 MED ORDER — LORAZEPAM 2 MG/ML IJ SOLN
INTRAMUSCULAR | Status: AC
  Filled 2014-12-01: qty 1

## 2014-12-01 MED ORDER — SODIUM CHLORIDE 0.9 % IJ SOLN
10.0000 mL | INTRAMUSCULAR | Status: DC | PRN
  Administered 2014-12-01: 10 mL
  Filled 2014-12-01: qty 10

## 2014-12-01 MED ORDER — FAMOTIDINE IN NACL 20-0.9 MG/50ML-% IV SOLN
INTRAVENOUS | Status: AC
Start: 2014-12-01 — End: 2014-12-01
  Filled 2014-12-01: qty 50

## 2014-12-01 MED ORDER — SODIUM CHLORIDE 0.9 % IV SOLN
Freq: Once | INTRAVENOUS | Status: AC
  Administered 2014-12-01: 12:00:00 via INTRAVENOUS
  Filled 2014-12-01: qty 8

## 2014-12-01 MED ORDER — FAMOTIDINE IN NACL 20-0.9 MG/50ML-% IV SOLN
20.0000 mg | Freq: Once | INTRAVENOUS | Status: AC
  Administered 2014-12-01: 20 mg via INTRAVENOUS

## 2014-12-01 MED ORDER — PACLITAXEL CHEMO INJECTION 300 MG/50ML
45.0000 mg/m2 | Freq: Once | INTRAVENOUS | Status: AC
  Administered 2014-12-01: 102 mg via INTRAVENOUS
  Filled 2014-12-01: qty 17

## 2014-12-01 MED ORDER — HEPARIN SOD (PORK) LOCK FLUSH 100 UNIT/ML IV SOLN
500.0000 [IU] | Freq: Once | INTRAVENOUS | Status: AC | PRN
  Administered 2014-12-01: 500 [IU]
  Filled 2014-12-01: qty 5

## 2014-12-01 MED ORDER — SODIUM CHLORIDE 0.9 % IJ SOLN
10.0000 mL | INTRAMUSCULAR | Status: DC | PRN
  Administered 2014-12-01: 10 mL via INTRAVENOUS
  Filled 2014-12-01: qty 10

## 2014-12-01 MED ORDER — DIPHENHYDRAMINE HCL 50 MG/ML IJ SOLN
INTRAMUSCULAR | Status: AC
  Filled 2014-12-01: qty 1

## 2014-12-01 MED ORDER — SODIUM CHLORIDE 0.9 % IV SOLN
221.8000 mg | Freq: Once | INTRAVENOUS | Status: AC
  Administered 2014-12-01: 220 mg via INTRAVENOUS
  Filled 2014-12-01: qty 22

## 2014-12-01 MED ORDER — DIPHENHYDRAMINE HCL 50 MG/ML IJ SOLN
50.0000 mg | Freq: Once | INTRAMUSCULAR | Status: AC
Start: 2014-12-01 — End: 2014-12-01
  Administered 2014-12-01: 50 mg via INTRAVENOUS

## 2014-12-01 MED ORDER — SODIUM CHLORIDE 0.9 % IV SOLN
Freq: Once | INTRAVENOUS | Status: AC
  Administered 2014-12-01: 12:00:00 via INTRAVENOUS

## 2014-12-01 NOTE — Assessment & Plan Note (Signed)
This is likely due to recent treatment. The patient denies recent history of bleeding such as epistaxis, hematuria or hematochezia. She is asymptomatic from the low platelet count. I will observe for now.  she does not require transfusion now. I will continue the chemotherapy at current dose without dosage adjustment.  If the pancytopenia gets progressive worse in the future, I might have to delay her treatment or adjust the chemotherapy dose.

## 2014-12-01 NOTE — Patient Instructions (Signed)

## 2014-12-01 NOTE — Telephone Encounter (Signed)
per pof to sch pt appt-gave pt copy opf avs-sent Mw emailt o sch trmt-pt aware

## 2014-12-01 NOTE — Assessment & Plan Note (Signed)
She tolerated treatment well without side effects. We will proceed with treatment without dose adjustment. I will order PET scan in 2 weeks to assess Rx response The patient desires second opinion at [facility] and I will refer her there.

## 2014-12-01 NOTE — Telephone Encounter (Signed)
Per staff message and POF I have scheduled appts. Advised scheduler of appts. JMW  

## 2014-12-01 NOTE — Progress Notes (Signed)
[facility] OFFICE PROGRESS NOTE  Patient Care Team: Carol Ada, MD as PCP - General (Family Medicine)  SUMMARY OF ONCOLOGIC HISTORY: Oncology History   Adenocarcinoma of upper lobe of right lung   Staging form: Lung, AJCC 6th Edition     Clinical stage from 12/29/2013: Stage IV (T4, N3, M1) - Signed by [name], MD on 12/29/2013       Adenocarcinoma of upper lobe of right lung   12/13/2013 - 12/17/2013 Hospital Admission She was admitted to the hospital for workup of severe pain and neurological deficit and was found to have 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   12/13/2013 Imaging CT scan show large mass in the right lung apex likely representing primary lung carcinoma or large metastasis. Metastases demonstrated in the pretracheal lymph nodes, T1 vertebral body and possibly sacrum.   12/14/2013 Initial Diagnosis Adenocarcinoma of upper lobe of right lung   12/14/2013 Pathology Results Accession: [id number] biopsy from right upper lobe showed adenocarcinoma. Foundation One testing was positive for several mutations without any known treatment options now   12/17/2013 Imaging MRI of the head is negative.   12/23/2013 - 02/05/2014 Radiation Therapy She received radiation therapy to the mediastinal mass and bone   12/28/2013 Imaging PET/CT scan showed mediastinal mass, lymphadenopathy and T1 involvement.   01/01/2014 - 01/29/2014 Chemotherapy Weekly carboplatin and Taxol were added   03/12/2014 Imaging Repeat PET scan showed near complete response to treatment.   03/16/2014 - 05/28/2014 Chemotherapy She started treatment with Alimta for maintenance   07/07/2014 Imaging PET scan showed disease progression   07/15/2014 - 09/23/2014 Chemotherapy She received palliative treatment with Opdivo   08/13/2014 - 08/25/2014 Radiation Therapy She completed palliative radiation therapy to the bone.   10/05/2014 Imaging PET CT showed disease progression except for site of  recent radiation therapy   10/13/2014 -  Chemotherapy She received Rx with weekly carbo/taxol, 2 weeks on 1 week off with Avastin every 3 weeks    INTERVAL HISTORY: Please see below for problem oriented charting.  she is seen prior to cycle 6 of treatment. She complained of mild neuropathy but that does not bother her. She denies any headaches.  she tolerated chemotherapy without side effects of chest nausea, vomiting or change in bowel habits. She continues to work  REVIEW OF SYSTEMS:   Constitutional: Denies fevers, chills or abnormal weight loss Eyes: Denies blurriness of vision Ears, nose, mouth, throat, and face: Denies mucositis or sore throat Respiratory: Denies cough, dyspnea or wheezes Cardiovascular: Denies palpitation, chest discomfort or lower extremity swelling Gastrointestinal:  Denies nausea, heartburn or change in bowel habits Skin: Denies abnormal skin rashes Lymphatics: Denies new lymphadenopathy or easy bruising Neurological:Denies numbness, tingling or new weaknesses Behavioral/Psych: Mood is stable, no new changes  All other systems were reviewed with the patient and are negative.  I have reviewed the past medical history, past surgical history, social history and family history with the patient and they are unchanged from previous note.  ALLERGIES:  is allergic to codeine; penicillins; and sulfa antibiotics.  MEDICATIONS:  Current Outpatient Prescriptions  Medication Sig Dispense Refill  . albuterol (PROVENTIL HFA;VENTOLIN HFA) 108 (90 BASE) MCG/ACT inhaler Inhale 2 puffs into the lungs every 4 (four) hours as needed for wheezing or shortness of breath. 1 Inhaler 5  . amLODipine (NORVASC) 10 MG tablet TAKE 1 TABLET BY MOUTH EVERY DAY 30 tablet 0  . fluticasone (FLONASE) 50 MCG/ACT nasal spray Place 2 sprays into  both nostrils daily. 16 g 5  . Fluticasone Furoate-Vilanterol (BREO ELLIPTA) 200-25 MCG/INH AEPB Inhale 1 puff into the lungs daily.     Marland Kitchen  levothyroxine (SYNTHROID, LEVOTHROID) 200 MCG tablet TAKE 1 TABLET BY MOUTH DAILY BEFORE BREAKFAST. 30 tablet 2  . LORazepam (ATIVAN) 0.5 MG tablet TAKE 1 TABLET BY MOUTH EVERY 8 HOURS AS NEEDED FOR ANXIETY OR NAUSEA 90 tablet 0  . mirtazapine (REMERON) 15 MG tablet Take 1 tablet (15 mg total) by mouth at bedtime as needed (For sleep.). 30 tablet 3  . ondansetron (ZOFRAN) 8 MG tablet Take 1 tablet (8 mg total) by mouth every 6 (six) hours as needed for nausea. 60 tablet 0  . ranitidine (ZANTAC) 150 MG capsule Take 1 capsule (150 mg total) by mouth every evening. 30 capsule 6   No current facility-administered medications for this visit.    PHYSICAL EXAMINATION: ECOG PERFORMANCE STATUS: 0 - Asymptomatic  Filed Vitals:   12/01/14 1016  BP: 151/68  Pulse: 69  Temp: 98.5 F (36.9 C)  Resp: 18   Filed Weights   12/01/14 1016  Weight: 234 lb 11.2 oz (106.459 kg)    GENERAL:alert, no distress and comfortable SKIN: skin color, texture, turgor are normal, no rashes or significant lesions EYES: normal, Conjunctiva are pink and non-injected, sclera clear OROPHARYNX:no exudate, no erythema and lips, buccal mucosa, and tongue normal  NECK: supple, thyroid normal size, non-tender, without nodularity LYMPH:  no palpable lymphadenopathy in the cervical, axillary or inguinal LUNGS: clear to auscultation and percussion with normal breathing effort HEART: regular rate & rhythm and no murmurs and no lower extremity edema ABDOMEN:abdomen soft, non-tender and normal bowel sounds Musculoskeletal:no cyanosis of digits and no clubbing  NEURO: alert & oriented x 3 with fluent speech, no focal motor/sensory deficits  LABORATORY DATA:  I have reviewed the data as listed    Component Value Date/Time   NA 140 12/01/2014 0935   NA 138 06/24/2014 1422   K 4.0 12/01/2014 0935   K 4.0 06/24/2014 1422   CL 96* 06/24/2014 1422   CO2 25 12/01/2014 0935   CO2 30 06/24/2014 1422   GLUCOSE 128 12/01/2014  0935   GLUCOSE 151* 06/24/2014 1422   BUN 18.7 12/01/2014 0935   BUN 13 06/24/2014 1422   CREATININE 1.1 12/01/2014 0935   CREATININE 1.54* 06/24/2014 1422   CALCIUM 9.2 12/01/2014 0935   CALCIUM 9.1 06/24/2014 1422   PROT 7.3 12/01/2014 0935   PROT 7.5 12/13/2013 2018   ALBUMIN 3.4* 12/01/2014 0935   ALBUMIN 3.7 12/13/2013 2018   AST 16 12/01/2014 0935   AST 17 12/13/2013 2018   ALT 16 12/01/2014 0935   ALT 14 12/13/2013 2018   ALKPHOS 87 12/01/2014 0935   ALKPHOS 80 12/13/2013 2018   BILITOT 0.31 12/01/2014 0935   BILITOT 0.3 12/13/2013 2018   GFRNONAA 36* 06/24/2014 1422   GFRAA 41* 06/24/2014 1422    No results found for: SPEP, UPEP  Lab Results  Component Value Date   WBC 3.1* 12/01/2014   NEUTROABS 1.8 12/01/2014   HGB 10.8* 12/01/2014   HCT 32.8* 12/01/2014   MCV 92.6 12/01/2014   PLT 177 12/01/2014      Chemistry      Component Value Date/Time   NA 140 12/01/2014 0935   NA 138 06/24/2014 1422   K 4.0 12/01/2014 0935   K 4.0 06/24/2014 1422   CL 96* 06/24/2014 1422   CO2 25 12/01/2014 0935   CO2 30  06/24/2014 1422   BUN 18.7 12/01/2014 0935   BUN 13 06/24/2014 1422   CREATININE 1.1 12/01/2014 0935   CREATININE 1.54* 06/24/2014 1422      Component Value Date/Time   CALCIUM 9.2 12/01/2014 0935   CALCIUM 9.1 06/24/2014 1422   ALKPHOS 87 12/01/2014 0935   ALKPHOS 80 12/13/2013 2018   AST 16 12/01/2014 0935   AST 17 12/13/2013 2018   ALT 16 12/01/2014 0935   ALT 14 12/13/2013 2018   BILITOT 0.31 12/01/2014 0935   BILITOT 0.3 12/13/2013 2018      ASSESSMENT & PLAN:  Adenocarcinoma of upper lobe of right lung She tolerated treatment well without side effects. We will proceed with treatment without dose adjustment. I will order PET scan in 2 weeks to assess Rx response The patient desires second opinion at [facility] and I will refer her there.    Pancytopenia due to antineoplastic chemotherapy [facility]) This is likely due to recent  treatment. The patient denies recent history of bleeding such as epistaxis, hematuria or hematochezia. She is asymptomatic from the low platelet count. I will observe for now.  she does not require transfusion now. I will continue the chemotherapy at current dose without dosage adjustment.  If the pancytopenia gets progressive worse in the future, I might have to delay her treatment or adjust the chemotherapy dose.    Neuropathy due to chemotherapeutic drug [facility]) She has mild, grade 1 neuropathy in her feet. It is not debilitating. We will monitor for that carefully while on treatment. Right now, I will proceed with treatment without dose adjustment.    Essential hypertension  Her blood pressure fluctuated up and down. Recent urinalysis show mild proteinuria. I will continue to monitor her blood pressure carefully.     Orders Placed This Encounter  Procedures  . NM PET Image Restag (PS) Skull Base To Thigh    Standing Status: Future     Number of Occurrences:      Standing Expiration Date: 01/31/2016    Order Specific Question:  Reason for Exam (SYMPTOM  OR DIAGNOSIS REQUIRED)    Answer:  staging lung ca, assess response to Rx    Order Specific Question:  Is the patient pregnant?    Answer:  No    Order Specific Question:  Preferred imaging location?    Answer:  [facility]   All questions were answered. The patient knows to call the clinic with any problems, questions or concerns. No barriers to learning was detected. I spent 30 minutes counseling the patient face to face. The total time spent in the appointment was 40 minutes and more than 50% was on counseling and review of test results     [facility], [place], MD 12/01/2014 11:35 AM

## 2014-12-01 NOTE — Assessment & Plan Note (Signed)
She has mild, grade 1 neuropathy in her feet. It is not debilitating. We will monitor for that carefully while on treatment. Right now, I will proceed with treatment without dose adjustment.

## 2014-12-01 NOTE — Patient Instructions (Signed)
[facility] Discharge Instructions for Patients Receiving Chemotherapy  Today you received the following chemotherapy agents Taxol and Carboplatin.  To help prevent nausea and vomiting after your treatment, we encourage you to take your nausea medication as prescribed.   If you develop nausea and vomiting that is not controlled by your nausea medication, call the clinic.   BELOW ARE SYMPTOMS THAT SHOULD BE REPORTED IMMEDIATELY:  *FEVER GREATER THAN 100.5 F  *CHILLS WITH OR WITHOUT FEVER  NAUSEA AND VOMITING THAT IS NOT CONTROLLED WITH YOUR NAUSEA MEDICATION  *UNUSUAL SHORTNESS OF BREATH  *UNUSUAL BRUISING OR BLEEDING  TENDERNESS IN MOUTH AND THROAT WITH OR WITHOUT PRESENCE OF ULCERS  *URINARY PROBLEMS  *BOWEL PROBLEMS  UNUSUAL RASH Items with * indicate a potential emergency and should be followed up as soon as possible.  Feel free to call the clinic you have any questions or concerns. The clinic phone number is [phone].  Please show the CHEMO ALERT CARD at check-in to the Emergency Department and triage nurse.   

## 2014-12-01 NOTE — Assessment & Plan Note (Signed)
Her blood pressure fluctuated up and down. Recent urinalysis show mild proteinuria. I will continue to monitor her blood pressure carefully.

## 2014-12-09 ENCOUNTER — Telehealth: Payer: Self-pay | Admitting: *Deleted

## 2014-12-09 ENCOUNTER — Telehealth: Payer: Self-pay | Admitting: Hematology and Oncology

## 2014-12-09 NOTE — Telephone Encounter (Signed)
Pt states PET could not be scheduled until 11/16 at 7 am.   She is scheduled to see Dr. Alvy Bimler same day at 9 am and wonders if results would be available that soon?   She is also wants to see if she can move PET to a little later in the morning so she can come here first to have her PAC accessed,  Assuming Dr. Alvy Bimler moves her appt to later that day or next day?

## 2014-12-09 NOTE — Telephone Encounter (Signed)
S/w pt and she does not want to use her vein for PET,  States it was too difficult for them to find a vein last time and too painful.  She s/w someone in PET dept who assured her they have someone who can access her PAC there.   She kept her appt for 11/16 at 7 am as scheduled.  Dr. Alvy Bimler instructed to move her appt from 9 am to 1 pm same day to have time to get results.  Informed pt we will move her appts as follows for 11/16;  Lab at 12:30 pm,  MD at 1 pm and Chemo after MD.   Pt verbalized understanding.

## 2014-12-09 NOTE — Telephone Encounter (Signed)
pt cld wanting to r/s lab/flush due to time PET was sch-gave pt time adv Dr Alvy Bimler pof stated to have on 11/15-pt wanted to speak to nurse-trans to [facility] left appt to 11/16 after PET-ok Melissa

## 2014-12-09 NOTE — Telephone Encounter (Signed)
With PET so soon, we cannot access her port I would recommend PET to use her vein to inject the contrast I can see her at 1 pm on Wed to review test results We can move her chemo to the next day with labs If she is ok with the arrangement please place a new POF/call scheduler

## 2014-12-10 ENCOUNTER — Telehealth: Payer: Self-pay | Admitting: Hematology and Oncology

## 2014-12-10 ENCOUNTER — Other Ambulatory Visit: Payer: Self-pay | Admitting: *Deleted

## 2014-12-10 ENCOUNTER — Other Ambulatory Visit: Payer: Self-pay | Admitting: Hematology and Oncology

## 2014-12-10 ENCOUNTER — Telehealth: Payer: Self-pay | Admitting: *Deleted

## 2014-12-10 NOTE — Telephone Encounter (Signed)
Left message for patient confirming 11/16 appointments.

## 2014-12-10 NOTE — Telephone Encounter (Signed)
Per staff messages and POF I have scheduled appts. Advised scheduler of appts. JMW  

## 2014-12-13 ENCOUNTER — Telehealth: Payer: Self-pay | Admitting: Emergency Medicine

## 2014-12-13 MED ORDER — LEVOFLOXACIN 500 MG PO TABS: 500.0000 mg | ORAL_TABLET | Freq: Every day | ORAL | Status: DC

## 2014-12-13 MED ORDER — PREDNISONE 10 MG PO TABS: ORAL_TABLET | ORAL | Status: DC

## 2014-12-13 NOTE — Telephone Encounter (Signed)
Pt returning call because she hadn't heard from anyone since early this morning, advise pt that you were waiting to hear from [place] who was to come into office this afternoon but had gotten sick, she was asking if there was another dr that could handle this in his absence please advise.Jasmine Grant

## 2014-12-13 NOTE — Telephone Encounter (Signed)
Levaquin 500 mg qd for 7 days. Pred taper starting at 60 mg. Reduce by 10 mg every 3 days

## 2014-12-13 NOTE — Telephone Encounter (Signed)
Routing to PM in RB absence

## 2014-12-13 NOTE — Telephone Encounter (Signed)
Patient notified of Dr. Matilde Bash recommendations Rx sent to pharmacy Nothing further needed. Closing encounter

## 2014-12-13 NOTE — Telephone Encounter (Signed)
Called and spoke to pt. Pt c/o sinus congestion, headache, PND, sore throat, chills (latest temperature was this morning: 98.5, tylenol last taken on 12/12/14), prod cough with yellow and green mucus, increase in SOB x 1 week. Pt states her s/s worsened over the weekend. Pt denies CP/tightness, hemoptysis, orthopnea, swelling. Pt states she has been taking flonase with little relief.   Dr. Lamonte Sakai, please advise. Thanks.

## 2014-12-14 ENCOUNTER — Other Ambulatory Visit: Payer: No Typology Code available for payment source

## 2014-12-15 ENCOUNTER — Other Ambulatory Visit (HOSPITAL_BASED_OUTPATIENT_CLINIC_OR_DEPARTMENT_OTHER): Payer: No Typology Code available for payment source

## 2014-12-15 ENCOUNTER — Ambulatory Visit (HOSPITAL_BASED_OUTPATIENT_CLINIC_OR_DEPARTMENT_OTHER): Payer: No Typology Code available for payment source | Admitting: Hematology and Oncology

## 2014-12-15 ENCOUNTER — Encounter: Payer: Self-pay | Admitting: Hematology and Oncology

## 2014-12-15 ENCOUNTER — Ambulatory Visit (HOSPITAL_BASED_OUTPATIENT_CLINIC_OR_DEPARTMENT_OTHER): Payer: No Typology Code available for payment source

## 2014-12-15 ENCOUNTER — Other Ambulatory Visit: Payer: No Typology Code available for payment source

## 2014-12-15 ENCOUNTER — Encounter (HOSPITAL_COMMUNITY)
Admission: RE | Admit: 2014-12-15 | Discharge: 2014-12-15 | Disposition: A | Discharge status: Home / Self Care | Payer: No Typology Code available for payment source | Source: Ambulatory Visit | Attending: Hematology and Oncology | Admitting: Hematology and Oncology

## 2014-12-15 ENCOUNTER — Telehealth: Payer: Self-pay | Admitting: Hematology and Oncology

## 2014-12-15 ENCOUNTER — Ambulatory Visit: Payer: No Typology Code available for payment source

## 2014-12-15 ENCOUNTER — Telehealth: Payer: Self-pay | Admitting: *Deleted

## 2014-12-15 VITALS — BP 156/74 | HR 98 | Temp 98.3°F | Resp 18 | Ht 74.0 in | Wt 236.6 lb

## 2014-12-15 DIAGNOSIS — G622 Polyneuropathy due to other toxic agents: Secondary | ICD-10-CM

## 2014-12-15 DIAGNOSIS — C3411 Malignant neoplasm of upper lobe, right bronchus or lung: Secondary | ICD-10-CM | POA: Diagnosis not present

## 2014-12-15 DIAGNOSIS — Z5111 Encounter for antineoplastic chemotherapy: Secondary | ICD-10-CM

## 2014-12-15 DIAGNOSIS — Z5112 Encounter for antineoplastic immunotherapy: Secondary | ICD-10-CM

## 2014-12-15 DIAGNOSIS — D61810 Antineoplastic chemotherapy induced pancytopenia: Secondary | ICD-10-CM

## 2014-12-15 DIAGNOSIS — G620 Drug-induced polyneuropathy: Secondary | ICD-10-CM

## 2014-12-15 DIAGNOSIS — I10 Essential (primary) hypertension: Secondary | ICD-10-CM | POA: Diagnosis not present

## 2014-12-15 DIAGNOSIS — Z95828 Presence of other vascular implants and grafts: Secondary | ICD-10-CM

## 2014-12-15 DIAGNOSIS — T451X5A Adverse effect of antineoplastic and immunosuppressive drugs, initial encounter: Secondary | ICD-10-CM

## 2014-12-15 LAB — COMPREHENSIVE METABOLIC PANEL (CC13)
ALBUMIN: 3.6 g/dL (ref 3.5–5.0)
ALK PHOS: 83 U/L (ref 40–150)
ALT: 14 U/L (ref 0–55)
ANION GAP: 13 meq/L — AB (ref 3–11)
AST: 14 U/L (ref 5–34)
BUN: 22.3 mg/dL (ref 7.0–26.0)
CALCIUM: 9.6 mg/dL (ref 8.4–10.4)
CO2: 21 mEq/L — ABNORMAL LOW (ref 22–29)
Chloride: 108 mEq/L (ref 98–109)
Creatinine: 1.4 mg/dL — ABNORMAL HIGH (ref 0.6–1.1)
EGFR: 40 mL/min/{1.73_m2} — AB (ref >90)
Glucose: 189 mg/dl — ABNORMAL HIGH (ref 70–140)
Potassium: 4.3 mEq/L (ref 3.5–5.1)
Sodium: 143 mEq/L (ref 136–145)
TOTAL PROTEIN: 7.7 g/dL (ref 6.4–8.3)

## 2014-12-15 LAB — CBC WITH DIFFERENTIAL/PLATELET
BASO%: 0 % (ref 0.0–2.0)
Basophils Absolute: 0 10*3/uL (ref 0.0–0.1)
EOS ABS: 0 10*3/uL (ref 0.0–0.5)
EOS%: 0 % (ref 0.0–7.0)
HCT: 32.3 % — ABNORMAL LOW (ref 34.8–46.6)
HEMOGLOBIN: 10.6 g/dL — AB (ref 11.6–15.9)
LYMPH%: 8.8 % — AB (ref 14.0–49.7)
MCH: 31.4 pg (ref 25.1–34.0)
MCHC: 32.8 g/dL (ref 31.5–36.0)
MCV: 95.6 fL (ref 79.5–101.0)
MONO#: 0.3 10*3/uL (ref 0.1–0.9)
MONO%: 5.7 % (ref 0.0–14.0)
NEUT%: 85.5 % — ABNORMAL HIGH (ref 38.4–76.8)
NEUTROS ABS: 4.1 10*3/uL (ref 1.5–6.5)
PLATELETS: 172 10*3/uL (ref 145–400)
RBC: 3.38 10*6/uL — AB (ref 3.70–5.45)
RDW: 17.7 % — ABNORMAL HIGH (ref 11.2–14.5)
WBC: 4.8 10*3/uL (ref 3.9–10.3)
lymph#: 0.4 10*3/uL — ABNORMAL LOW (ref 0.9–3.3)

## 2014-12-15 LAB — GLUCOSE, CAPILLARY: Glucose-Capillary: 110 mg/dL — ABNORMAL HIGH (ref 65–99)

## 2014-12-15 MED ORDER — SODIUM CHLORIDE 0.9 % IV SOLN
Freq: Once | INTRAVENOUS | Status: AC
  Administered 2014-12-15: 14:00:00 via INTRAVENOUS

## 2014-12-15 MED ORDER — FAMOTIDINE IN NACL 20-0.9 MG/50ML-% IV SOLN
INTRAVENOUS | Status: AC
  Filled 2014-12-15: qty 50

## 2014-12-15 MED ORDER — DIPHENHYDRAMINE HCL 50 MG/ML IJ SOLN
50.0000 mg | Freq: Once | INTRAMUSCULAR | Status: AC
  Administered 2014-12-15: 50 mg via INTRAVENOUS

## 2014-12-15 MED ORDER — HEPARIN SOD (PORK) LOCK FLUSH 100 UNIT/ML IV SOLN
500.0000 [IU] | Freq: Once | INTRAVENOUS | Status: AC | PRN
  Administered 2014-12-15: 500 [IU]
  Filled 2014-12-15: qty 5

## 2014-12-15 MED ORDER — LORAZEPAM 2 MG/ML IJ SOLN: 0.5000 mg | Freq: Once | INTRAMUSCULAR | Status: DC

## 2014-12-15 MED ORDER — DIPHENHYDRAMINE HCL 50 MG/ML IJ SOLN
INTRAMUSCULAR | Status: AC
  Filled 2014-12-15: qty 1

## 2014-12-15 MED ORDER — SODIUM CHLORIDE 0.9 % IV SOLN
185.0000 mg | Freq: Once | INTRAVENOUS | Status: AC
  Administered 2014-12-15: 190 mg via INTRAVENOUS
  Filled 2014-12-15: qty 19

## 2014-12-15 MED ORDER — FAMOTIDINE IN NACL 20-0.9 MG/50ML-% IV SOLN
20.0000 mg | Freq: Once | INTRAVENOUS | Status: AC
Start: 2014-12-15 — End: 2014-12-15
  Administered 2014-12-15: 20 mg via INTRAVENOUS

## 2014-12-15 MED ORDER — PACLITAXEL CHEMO INJECTION 300 MG/50ML
45.0000 mg/m2 | Freq: Once | INTRAVENOUS | Status: AC
  Administered 2014-12-15: 102 mg via INTRAVENOUS
  Filled 2014-12-15: qty 17

## 2014-12-15 MED ORDER — DEXAMETHASONE SODIUM PHOSPHATE 100 MG/10ML IJ SOLN
Freq: Once | INTRAMUSCULAR | Status: AC
  Administered 2014-12-15: 14:00:00 via INTRAVENOUS
  Filled 2014-12-15: qty 8

## 2014-12-15 MED ORDER — SODIUM CHLORIDE 0.9 % IJ SOLN
10.0000 mL | INTRAMUSCULAR | Status: DC | PRN
  Administered 2014-12-15: 10 mL
  Filled 2014-12-15: qty 10

## 2014-12-15 MED ORDER — SODIUM CHLORIDE 0.9 % IJ SOLN
10.0000 mL | INTRAMUSCULAR | Status: DC | PRN
  Administered 2014-12-15: 10 mL via INTRAVENOUS
  Filled 2014-12-15: qty 10

## 2014-12-15 MED ORDER — BEVACIZUMAB CHEMO INJECTION 400 MG/16ML
15.0000 mg/kg | Freq: Once | INTRAVENOUS | Status: AC
  Administered 2014-12-15: 1500 mg via INTRAVENOUS
  Filled 2014-12-15: qty 60

## 2014-12-15 MED ORDER — FLUDEOXYGLUCOSE F - 18 (FDG) INJECTION
10.9000 | Freq: Once | INTRAVENOUS | Status: DC | PRN
Start: 2014-12-15 — End: 2014-12-21
  Administered 2014-12-15: 10.9 via INTRAVENOUS
  Filled 2014-12-15: qty 10.9

## 2014-12-15 NOTE — Assessment & Plan Note (Signed)
This is likely due to recent treatment. The patient denies recent history of bleeding such as epistaxis, hematuria or hematochezia. She is asymptomatic from the low platelet count. I will observe for now.  she does not require transfusion now. I will continue the chemotherapy at current dose without dosage adjustment.  If the pancytopenia gets progressive worse in the future, I might have to delay her treatment or adjust the chemotherapy dose.

## 2014-12-15 NOTE — Patient Instructions (Signed)

## 2014-12-15 NOTE — Assessment & Plan Note (Signed)
Her blood pressure fluctuated up and down. Recent urinalysis show mild proteinuria. I will continue to monitor her blood pressure carefully. She will continue medical management

## 2014-12-15 NOTE — Telephone Encounter (Signed)
Foundation One testing faxed to Dr Karene Fry

## 2014-12-15 NOTE — Telephone Encounter (Signed)
Gave and printd appt sched and avs for pt for NOV and Dec

## 2014-12-15 NOTE — Patient Instructions (Signed)
[facility] Discharge Instructions for Patients Receiving Chemotherapy  Today you received the following chemotherapy agents Taxol/Carboplatin/Avastin  To help prevent nausea and vomiting after your treatment, we encourage you to take your nausea medication     If you develop nausea and vomiting that is not controlled by your nausea medication, call the clinic.   BELOW ARE SYMPTOMS THAT SHOULD BE REPORTED IMMEDIATELY:  *FEVER GREATER THAN 100.5 F  *CHILLS WITH OR WITHOUT FEVER  NAUSEA AND VOMITING THAT IS NOT CONTROLLED WITH YOUR NAUSEA MEDICATION  *UNUSUAL SHORTNESS OF BREATH  *UNUSUAL BRUISING OR BLEEDING  TENDERNESS IN MOUTH AND THROAT WITH OR WITHOUT PRESENCE OF ULCERS  *URINARY PROBLEMS  *BOWEL PROBLEMS  UNUSUAL RASH Items with * indicate a potential emergency and should be followed up as soon as possible.  Feel free to call the clinic you have any questions or concerns. The clinic phone number is [phone].  Please show the [place] at check-in to the Emergency Department and triage nurse.

## 2014-12-15 NOTE — Progress Notes (Signed)
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   12/13/2013 Imaging CT scan show large mass in the right lung apex likely representing primary lung carcinoma or large metastasis. Metastases demonstrated in the pretracheal lymph nodes, T1 vertebral body and possibly sacrum.   12/14/2013 Initial Diagnosis Adenocarcinoma of upper lobe of right lung   12/14/2013 Pathology Results Accession: [id number] biopsy from right upper lobe showed adenocarcinoma. [facility] testing was positive for several mutations without any known treatment options now   12/17/2013 Imaging MRI of the head is negative.   12/23/2013 - 02/05/2014 Radiation Therapy She received radiation therapy to the mediastinal mass and bone   12/28/2013 Imaging PET/CT scan showed mediastinal mass, lymphadenopathy and T1 involvement.   01/01/2014 - 01/29/2014 Chemotherapy Weekly carboplatin and Taxol were added   03/12/2014 Imaging Repeat PET scan showed near complete response to treatment.   03/16/2014 - 05/28/2014 Chemotherapy She started treatment with Alimta for maintenance   07/07/2014 Imaging PET scan showed disease progression   07/15/2014 - 09/23/2014 Chemotherapy She received palliative treatment with Opdivo   08/13/2014 - 08/25/2014 Radiation Therapy She completed palliative radiation therapy to the bone.   10/05/2014 Imaging PET CT showed disease progression except for site of  recent radiation therapy   10/13/2014 -  Chemotherapy She received Rx with weekly carbo/taxol, 2 weeks on 1 week off with Avastin every 3 weeks   12/15/2014 Imaging PET scan showed positive response to Rx    INTERVAL HISTORY: Please see below for problem oriented charting. She returns for further follow-up. She is doing well. Denies side effects from treatment. She has very mild peripheral neuropathy but it does not bother her. She denies recent infection.  REVIEW OF SYSTEMS:   Constitutional: Denies fevers, chills or abnormal weight loss Eyes: Denies blurriness of vision Ears, nose, mouth, throat, and face: Denies mucositis or sore throat Respiratory: Denies cough, dyspnea or wheezes Cardiovascular: Denies palpitation, chest discomfort or lower extremity swelling Gastrointestinal:  Denies nausea, heartburn or change in bowel habits Skin: Denies abnormal skin rashes Lymphatics: Denies new lymphadenopathy or easy bruising Neurological:Denies numbness, tingling or new weaknesses Behavioral/Psych: Mood is stable, no new changes  All other systems were reviewed with the patient and are negative.  I have reviewed the past medical history, past surgical history, social history and family history with the patient and they are unchanged from previous note.  ALLERGIES:  is allergic to codeine; penicillins; and sulfa antibiotics.  MEDICATIONS:  Current Outpatient Prescriptions  Medication Sig Dispense Refill  . albuterol (PROVENTIL HFA;VENTOLIN HFA) 108 (90 BASE) MCG/ACT inhaler Inhale 2 puffs into the lungs every 4 (four) hours as needed for wheezing or shortness of breath. 1 Inhaler 5  . amLODipine (NORVASC) 10 MG tablet TAKE 1 TABLET BY MOUTH EVERY DAY 30 tablet 0  . fluticasone (FLONASE) 50 MCG/ACT nasal spray Place 2 sprays into both nostrils daily. [place]  g 5  . Fluticasone Furoate-Vilanterol (BREO ELLIPTA) 200-25 MCG/INH AEPB Inhale 1 puff into the lungs daily.     Mar[place] Kitchen levofloxacin  (LEVAQUIN) 500 MG tablet Take 1 tablet (500 mg total) by mouth daily. 7 tablet 0  . levothyroxine (SYNTHROID, LEVOTHROID) 200 MCG tablet TAKE 1 TABLET BY MOUTH DAILY BEFORE BREAKFAST. 30 tablet 2  . LORazepam (ATIVAN) 0.5 MG tablet TAKE 1 TABLET BY MOUTH EVERY 8 HOURS AS NEEDED FOR ANXIETY OR NAUSEA 90 tablet 0  . mirtazapine (REMERON) 15 MG tablet TAKE 1 TABLET (15 MG TOTAL) BY MOUTH AT BEDTIME AS NEEDED (FOR SLEEP.). 30 tablet 3  . predniSONE (DELTASONE) 10 MG tablet Take '60mg'$  x 3 days, '50mg'$  x 3 days, '40mg'$  x 3 days, '30mg'$  x 3 days, '20mg'$  x 3 days, '10mg'$  x 3 days, then STOP 63 tablet 0  . ranitidine (ZANTAC) 150 MG capsule Take 1 capsule (150 mg total) by mouth every evening. 30 capsule 6  . ondansetron (ZOFRAN) 8 MG tablet Take 1 tablet (8 mg total) by mouth every 6 (six) hours as needed for nausea. (Patient not taking: Reported on 12/15/2014) 60 tablet 0   No current facility-administered medications for this visit.   Facility-Administered Medications Ordered in Other Visits  Medication Dose Route Frequency Provider Last Rate Last Dose  . bevacizumab (AVASTIN) 1,500 mg in sodium chloride 0.9 % 100 mL chemo infusion  15 mg/kg (Treatment Plan Actual) Intravenous Once [name], MD 320 mL/hr at 12/15/14 1443 1,500 mg at 12/15/14 1443  . CARBOplatin (PARAPLATIN) 190 mg in sodium chloride 0.9 % 100 mL chemo infusion  190 mg Intravenous Once [name], MD      . fludeoxyglucose F - 18 (FDG) injection 10.9 milli Curie  10.9 milli Curie Intravenous Once PRN [name], MD   10.9 milli Curie at 12/15/14 [phone]  . heparin lock flush 100 unit/mL  500 Units Intracatheter Once PRN [name], MD      . LORazepam (ATIVAN) injection 0.5 mg  0.5 mg Intravenous Once [name], MD      . PACLitaxel (TAXOL) 102 mg in dextrose 5 % 250 mL chemo infusion (</= '80mg'$ /m2)  45 mg/m2 (Treatment Plan Actual) Intravenous Once [name], MD      . sodium chloride 0.9 % injection 10 mL  10 mL Intracatheter PRN [name], MD         PHYSICAL EXAMINATION: ECOG PERFORMANCE STATUS: 1 - Symptomatic but completely ambulatory  Filed Vitals:   12/15/14 1157  BP: 156/74  Pulse: 98  Temp: 98.3 F (36.8 C)  Resp: 18   Filed Weights   12/15/14 1157  Weight: 236 lb 9.6 oz (107.321 kg)    GENERAL:alert, no distress and comfortable SKIN: skin color, texture, turgor are normal, no rashes or significant lesions EYES: normal, Conjunctiva are pink and non-injected, sclera clear OROPHARYNX:no exudate, no erythema and lips, buccal mucosa, and tongue normal  NECK: supple, thyroid normal size, non-tender, without nodularity LYMPH:  no palpable lymphadenopathy in the cervical, axillary or inguinal LUNGS: clear to auscultation and percussion with normal breathing effort HEART: regular rate & rhythm and no murmurs and no lower extremity edema ABDOMEN:abdomen soft, non-tender and normal bowel sounds Musculoskeletal:no cyanosis of digits and no clubbing  NEURO: alert & oriented x 3 with fluent speech, no focal motor/sensory deficits  LABORATORY DATA:  I have reviewed the data as listed    Component Value Date/Time   NA 143 12/15/2014 1134   NA 138 06/24/2014 1422  K 4.3 12/15/2014 1134   K 4.0 06/24/2014 1422   CL 96* 06/24/2014 1422   CO2 21* 12/15/2014 1134   CO2 30 06/24/2014 1422   GLUCOSE 189* 12/15/2014 1134   GLUCOSE 151* 06/24/2014 1422   BUN 22.3 12/15/2014 1134   BUN 13 06/24/2014 1422   CREATININE 1.4* 12/15/2014 1134   CREATININE 1.54* 06/24/2014 1422   CALCIUM 9.6 12/15/2014 1134   CALCIUM 9.1 06/24/2014 1422   PROT 7.7 12/15/2014 1134   PROT 7.5 12/13/2013 2018   ALBUMIN 3.6 12/15/2014 1134   ALBUMIN 3.7 12/13/2013 2018   AST 14 12/15/2014 1134   AST 17 12/13/2013 2018   ALT 14 12/15/2014 1134   ALT 14 12/13/2013 2018   ALKPHOS 83 12/15/2014 1134   ALKPHOS 80 12/13/2013 2018   BILITOT <0.30 12/15/2014 1134   BILITOT 0.3 12/13/2013 2018   GFRNONAA 36* 06/24/2014 1422   GFRAA 41* 06/24/2014  1422    No results found for: SPEP, UPEP  Lab Results  Component Value Date   WBC 4.8 12/15/2014   NEUTROABS 4.1 12/15/2014   HGB 10.6* 12/15/2014   HCT 32.3* 12/15/2014   MCV 95.6 12/15/2014   PLT 172 12/15/2014      Chemistry      Component Value Date/Time   NA 143 12/15/2014 1134   NA 138 06/24/2014 1422   K 4.3 12/15/2014 1134   K 4.0 06/24/2014 1422   CL 96* 06/24/2014 1422   CO2 21* 12/15/2014 1134   CO2 30 06/24/2014 1422   BUN 22.3 12/15/2014 1134   BUN 13 06/24/2014 1422   CREATININE 1.4* 12/15/2014 1134   CREATININE 1.54* 06/24/2014 1422      Component Value Date/Time   CALCIUM 9.6 12/15/2014 1134   CALCIUM 9.1 06/24/2014 1422   ALKPHOS 83 12/15/2014 1134   ALKPHOS 80 12/13/2013 2018   AST 14 12/15/2014 1134   AST 17 12/13/2013 2018   ALT 14 12/15/2014 1134   ALT 14 12/13/2013 2018   BILITOT <0.30 12/15/2014 1134   BILITOT 0.3 12/13/2013 2018       RADIOGRAPHIC STUDIES: I have personally reviewed the radiological images as listed and agreed with the findings in the report. Nm Pet Image Restag (ps) Skull Base To Thigh  12/15/2014  CLINICAL DATA:  Subsequent Treatment strategy for Lung cancer. EXAM: NUCLEAR MEDICINE PET SKULL BASE TO THIGH TECHNIQUE: 10.9 mCi F-18 FDG was injected intravenously. Full-ring PET imaging was performed from the skull base to thigh after the radiotracer. CT data was obtained and used for attenuation correction and anatomic localization. FASTING BLOOD GLUCOSE:  Value: 110 mg/dl COMPARISON:  10/05/2014 FINDINGS: NECK The index, peripherally calcified structure within the left carotid space has an SUV max equal to 2.65. Previously 9.5. CHEST Index right paratracheal adenopathy measures 2.5 cm and has an SUV max equal to 3.85. On the previous exam this measured 2.3 cm and had an SUV max equal to 5.14. The index sub- carinal lymph node measures 2.2 cm and has an SUV max equal to 9.84. Previously 2.8 cm an SUV max of 12.97. No pleural  fluid identified. Paramediastinal radiation changes again noted within both lungs. Multiple pulmonary nodules are again identified. The index right upper lobe pulmonary nodule measures 7 mm and has an SUV max equal to 2.96. Previously this measured 11 mm and had an SUV max equal to 10.74. Index left lower lobe pulmonary nodule measures 0.7 cm and has an SUV max equal to 2.35. On the  previous exam this measured 1.7 cm and had an SUV max equal to 18.59. ABDOMEN/PELVIS No abnormal hypermetabolic activity within the liver, pancreas, adrenal glands, or spleen. No hypermetabolic lymph nodes in the abdomen or pelvis. SKELETON There has been interval decrease in size of hypermetabolic tumor involving the left side of the C4 vertebra. The SUV max is equal to 5.54. Previously 7.28. Hyper metabolism associated with the L2-3 spinous processes no new areas of osseous hyper metabolism to suggest progressive bone metastases. Has resolved in the interval. IMPRESSION: 1. Interval response to therapy. 2. Decrease in size and degree of FDG uptake associated with pulmonary nodules and thoracic nodal metastasis. 3. Decrease in FDG uptake associated with C4 metastasis. Electronically Signed   By: Kerby Moors M.D.   On: 12/15/2014 09:11     ASSESSMENT & PLAN:  Adenocarcinoma of upper lobe of right lung She has excellent response to treatment based on recent imaging study.  She tolerated treatment very well apart from very mild peripheral neuropathy. She is mildly pancytopenic but is not symptomatic. I recommend we continue treatment for a few more months to achieve better response before we stop treatment and switch to maintenance Avastin. She agreed with the plan  Pancytopenia due to antineoplastic chemotherapy [facility]) This is likely due to recent treatment. The patient denies recent history of bleeding such as epistaxis, hematuria or hematochezia. She is asymptomatic from the low platelet count. I will observe for now.  she  does not require transfusion now. I will continue the chemotherapy at current dose without dosage adjustment.  If the pancytopenia gets progressive worse in the future, I might have to delay her treatment or adjust the chemotherapy dose.    Essential hypertension  Her blood pressure fluctuated up and down. Recent urinalysis show mild proteinuria. I will continue to monitor her blood pressure carefully. She will continue medical management   Neuropathy due to chemotherapeutic drug [facility]) She has mild, grade 1 neuropathy in her feet. It is not debilitating. We will monitor for that carefully while on treatment. Right now, I will proceed with treatment without dose adjustment.    No orders of the defined types were placed in this encounter.   All questions were answered. The patient knows to call the clinic with any problems, questions or concerns. No barriers to learning was detected. I spent 30 minutes counseling the patient face to face. The total time spent in the appointment was 40 minutes and more than 50% was on counseling and review of test results     [facility], [place], MD 12/15/2014 2:54 PM

## 2014-12-15 NOTE — Assessment & Plan Note (Signed)
She has excellent response to treatment based on recent imaging study.  She tolerated treatment very well apart from very mild peripheral neuropathy. She is mildly pancytopenic but is not symptomatic. I recommend we continue treatment for a few more months to achieve better response before we stop treatment and switch to maintenance Avastin. She agreed with the plan

## 2014-12-15 NOTE — Assessment & Plan Note (Addendum)
She has mild, grade 1 neuropathy in her feet. It is not debilitating. We will monitor for that carefully while on treatment. Right now, I will proceed with treatment without dose adjustment.

## 2014-12-22 ENCOUNTER — Ambulatory Visit (HOSPITAL_BASED_OUTPATIENT_CLINIC_OR_DEPARTMENT_OTHER): Payer: No Typology Code available for payment source

## 2014-12-22 ENCOUNTER — Other Ambulatory Visit (HOSPITAL_BASED_OUTPATIENT_CLINIC_OR_DEPARTMENT_OTHER): Payer: No Typology Code available for payment source

## 2014-12-22 ENCOUNTER — Ambulatory Visit: Payer: No Typology Code available for payment source

## 2014-12-22 VITALS — BP 140/80 | HR 62 | Temp 98.1°F | Resp 16

## 2014-12-22 DIAGNOSIS — C3411 Malignant neoplasm of upper lobe, right bronchus or lung: Secondary | ICD-10-CM

## 2014-12-22 DIAGNOSIS — Z95828 Presence of other vascular implants and grafts: Secondary | ICD-10-CM

## 2014-12-22 DIAGNOSIS — Z5111 Encounter for antineoplastic chemotherapy: Secondary | ICD-10-CM | POA: Diagnosis not present

## 2014-12-22 LAB — COMPREHENSIVE METABOLIC PANEL (CC13)
ALBUMIN: 3.7 g/dL (ref 3.5–5.0)
ALT: 15 U/L (ref 0–55)
ANION GAP: 10 meq/L (ref 3–11)
AST: 16 U/L (ref 5–34)
Alkaline Phosphatase: 78 U/L (ref 40–150)
BILIRUBIN TOTAL: 0.5 mg/dL (ref 0.20–1.20)
BUN: 23.8 mg/dL (ref 7.0–26.0)
CALCIUM: 9.3 mg/dL (ref 8.4–10.4)
CO2: 26 meq/L (ref 22–29)
CREATININE: 1.1 mg/dL (ref 0.6–1.1)
Chloride: 105 mEq/L (ref 98–109)
EGFR: 55 mL/min/{1.73_m2} — ABNORMAL LOW (ref >90)
Glucose: 129 mg/dl (ref 70–140)
Potassium: 3.9 mEq/L (ref 3.5–5.1)
Sodium: 141 mEq/L (ref 136–145)
TOTAL PROTEIN: 7.3 g/dL (ref 6.4–8.3)

## 2014-12-22 LAB — CBC WITH DIFFERENTIAL/PLATELET
BASO%: 0.1 % (ref 0.0–2.0)
Basophils Absolute: 0 10*3/uL (ref 0.0–0.1)
EOS ABS: 0 10*3/uL (ref 0.0–0.5)
EOS%: 0.4 % (ref 0.0–7.0)
HEMATOCRIT: 35 % (ref 34.8–46.6)
HEMOGLOBIN: 11.4 g/dL — AB (ref 11.6–15.9)
LYMPH#: 0.7 10*3/uL — AB (ref 0.9–3.3)
LYMPH%: 9.6 % — ABNORMAL LOW (ref 14.0–49.7)
MCH: 30.9 pg (ref 25.1–34.0)
MCHC: 32.6 g/dL (ref 31.5–36.0)
MCV: 94.9 fL (ref 79.5–101.0)
MONO#: 0.7 10*3/uL (ref 0.1–0.9)
MONO%: 9.5 % (ref 0.0–14.0)
NEUT%: 80.4 % — ABNORMAL HIGH (ref 38.4–76.8)
NEUTROS ABS: 5.6 10*3/uL (ref 1.5–6.5)
NRBC: 1 % — AB (ref 0–0)
PLATELETS: 154 10*3/uL (ref 145–400)
RBC: 3.69 10*6/uL — AB (ref 3.70–5.45)
RDW: 17.9 % — AB (ref 11.2–14.5)
WBC: 7 10*3/uL (ref 3.9–10.3)

## 2014-12-22 LAB — UA PROTEIN, DIPSTICK - CHCC: Protein, ur: 100 mg/dL

## 2014-12-22 LAB — TECHNOLOGIST REVIEW

## 2014-12-22 MED ORDER — FAMOTIDINE IN NACL 20-0.9 MG/50ML-% IV SOLN
INTRAVENOUS | Status: AC
  Filled 2014-12-22: qty 50

## 2014-12-22 MED ORDER — SODIUM CHLORIDE 0.9 % IV SOLN
Freq: Once | INTRAVENOUS | Status: AC
  Administered 2014-12-22: 14:00:00 via INTRAVENOUS
  Filled 2014-12-22: qty 8

## 2014-12-22 MED ORDER — CARBOPLATIN CHEMO INJECTION 450 MG/45ML
221.8000 mg | Freq: Once | INTRAVENOUS | Status: AC
  Administered 2014-12-22: 220 mg via INTRAVENOUS
  Filled 2014-12-22: qty 22

## 2014-12-22 MED ORDER — DIPHENHYDRAMINE HCL 50 MG/ML IJ SOLN
50.0000 mg | Freq: Once | INTRAMUSCULAR | Status: AC
  Administered 2014-12-22: 50 mg via INTRAVENOUS

## 2014-12-22 MED ORDER — DIPHENHYDRAMINE HCL 50 MG/ML IJ SOLN
INTRAMUSCULAR | Status: AC
  Filled 2014-12-22: qty 1

## 2014-12-22 MED ORDER — SODIUM CHLORIDE 0.9 % IJ SOLN
10.0000 mL | INTRAMUSCULAR | Status: DC | PRN
  Administered 2014-12-22: 10 mL via INTRAVENOUS
  Filled 2014-12-22: qty 10

## 2014-12-22 MED ORDER — SODIUM CHLORIDE 0.9 % IV SOLN
Freq: Once | INTRAVENOUS | Status: AC
  Administered 2014-12-22: 14:00:00 via INTRAVENOUS

## 2014-12-22 MED ORDER — HEPARIN SOD (PORK) LOCK FLUSH 100 UNIT/ML IV SOLN
500.0000 [IU] | Freq: Once | INTRAVENOUS | Status: AC | PRN
  Administered 2014-12-22: 500 [IU]
  Filled 2014-12-22: qty 5

## 2014-12-22 MED ORDER — FAMOTIDINE IN NACL 20-0.9 MG/50ML-% IV SOLN
20.0000 mg | Freq: Once | INTRAVENOUS | Status: AC
  Administered 2014-12-22: 20 mg via INTRAVENOUS

## 2014-12-22 MED ORDER — PACLITAXEL CHEMO INJECTION 300 MG/50ML
45.0000 mg/m2 | Freq: Once | INTRAVENOUS | Status: AC
  Administered 2014-12-22: 102 mg via INTRAVENOUS
  Filled 2014-12-22: qty 17

## 2014-12-22 MED ORDER — SODIUM CHLORIDE 0.9 % IJ SOLN
10.0000 mL | INTRAMUSCULAR | Status: DC | PRN
  Administered 2014-12-22: 10 mL
  Filled 2014-12-22: qty 10

## 2014-12-22 NOTE — Patient Instructions (Signed)
[facility] Discharge Instructions for Patients Receiving Chemotherapy  Today you received the following chemotherapy agents Taxol and Carboplatin.  To help prevent nausea and vomiting after your treatment, we encourage you to take your nausea medication as prescribed.   If you develop nausea and vomiting that is not controlled by your nausea medication, call the clinic.   BELOW ARE SYMPTOMS THAT SHOULD BE REPORTED IMMEDIATELY:  *FEVER GREATER THAN 100.5 F  *CHILLS WITH OR WITHOUT FEVER  NAUSEA AND VOMITING THAT IS NOT CONTROLLED WITH YOUR NAUSEA MEDICATION  *UNUSUAL SHORTNESS OF BREATH  *UNUSUAL BRUISING OR BLEEDING  TENDERNESS IN MOUTH AND THROAT WITH OR WITHOUT PRESENCE OF ULCERS  *URINARY PROBLEMS  *BOWEL PROBLEMS  UNUSUAL RASH Items with * indicate a potential emergency and should be followed up as soon as possible.  Feel free to call the clinic you have any questions or concerns. The clinic phone number is [phone].  Please show the CHEMO ALERT CARD at check-in to the Emergency Department and triage nurse.   

## 2014-12-22 NOTE — Progress Notes (Signed)
Patient refused ativan today.

## 2014-12-22 NOTE — Patient Instructions (Signed)

## 2014-12-23 ENCOUNTER — Other Ambulatory Visit: Payer: Self-pay | Admitting: Hematology and Oncology

## 2014-12-24 ENCOUNTER — Telehealth: Payer: Self-pay

## 2014-12-24 MED ORDER — MAGIC MOUTHWASH W/LIDOCAINE: 5.0000 mL | Freq: Four times a day (QID) | ORAL | Status: DC

## 2014-12-24 NOTE — Telephone Encounter (Signed)
Confirmed with Selena Lesser, FNP ok to send in Magic Mouth Wash, Dukes Formula with Nystatin and Lidocaine. 1:1. TC to pt to advise and confirmed [facility]. Rx faxed- confirmation received.

## 2014-12-24 NOTE — Telephone Encounter (Signed)
Pt

## 2014-12-24 NOTE — Telephone Encounter (Signed)
Jasmine Grant received D1 Cycle 8 Taxol/Carboon 12-22-14.  She began with with  sores on the tip and sides  of her tongue Wednesday 12-22-14.  No white patches. Patient on prednisone taper begun 12-13-14. She has been using biotene twice a day. She is able to eat and drink but it is very uncomfortable.  Can magic mouth wash be sent in for her?

## 2014-12-31 ENCOUNTER — Other Ambulatory Visit: Payer: Self-pay | Admitting: *Deleted

## 2014-12-31 MED ORDER — RANITIDINE HCL 150 MG PO TABS: 150.0000 mg | ORAL_TABLET | Freq: Every evening | ORAL | Status: DC

## 2014-12-31 MED ORDER — ONDANSETRON HCL 8 MG PO TABS: ORAL_TABLET | ORAL | Status: DC

## 2014-12-31 MED ORDER — LEVOTHYROXINE SODIUM 200 MCG PO TABS: ORAL_TABLET | ORAL | Status: DC

## 2014-12-31 MED ORDER — AMLODIPINE BESYLATE 10 MG PO TABS: 10.0000 mg | ORAL_TABLET | Freq: Every day | ORAL | Status: DC

## 2015-01-03 ENCOUNTER — Telehealth: Payer: Self-pay | Admitting: *Deleted

## 2015-01-03 MED ORDER — LIDOCAINE-PRILOCAINE 2.5-2.5 % EX CREA: 1.0000 "application " | TOPICAL_CREAM | CUTANEOUS | Status: DC | PRN

## 2015-01-03 MED ORDER — MIRTAZAPINE 15 MG PO TABS: ORAL_TABLET | ORAL | Status: DC

## 2015-01-03 NOTE — Telephone Encounter (Signed)
Message from Call Center pt had a nose bleed on 12/3 which lasted about an hour.   Called pt today and she says she has not had any further bleeding.  She is doing ok today and scheduled to come in Wed 12/7 for chemo.  Instructed pt to keep appt as scheduled.  Call us again if any further bleeding.   She verbalized understanding. Pt states she needs 90 day supply refilled on Remeron, EMLA and Ativan called to [facility].   Her insurance runs out at the end of this month so she is asking for 90 day refills.   Informed her it looks like Dr. Alvy Bimler already filled ativan 90 pills and I can call in the Remeron and EMLA.  She verbalized understanding.

## 2015-01-04 ENCOUNTER — Other Ambulatory Visit: Payer: Self-pay | Admitting: Hematology and Oncology

## 2015-01-04 ENCOUNTER — Telehealth: Payer: Self-pay | Admitting: *Deleted

## 2015-01-04 NOTE — Telephone Encounter (Signed)
LVM for pt to inform her of Avastin will be held from her treatment tomorrow due to the nose bleed per Dr. Alvy Bimler.   Asked her to call nurse back today if she has any questions or she can ask nurse tomorrow.

## 2015-01-05 ENCOUNTER — Ambulatory Visit (HOSPITAL_BASED_OUTPATIENT_CLINIC_OR_DEPARTMENT_OTHER): Payer: No Typology Code available for payment source

## 2015-01-05 ENCOUNTER — Ambulatory Visit: Payer: No Typology Code available for payment source

## 2015-01-05 ENCOUNTER — Other Ambulatory Visit (HOSPITAL_BASED_OUTPATIENT_CLINIC_OR_DEPARTMENT_OTHER): Payer: No Typology Code available for payment source

## 2015-01-05 VITALS — BP 140/82 | HR 73 | Temp 98.3°F | Resp 18

## 2015-01-05 DIAGNOSIS — C3411 Malignant neoplasm of upper lobe, right bronchus or lung: Secondary | ICD-10-CM | POA: Diagnosis not present

## 2015-01-05 DIAGNOSIS — Z5111 Encounter for antineoplastic chemotherapy: Secondary | ICD-10-CM | POA: Diagnosis not present

## 2015-01-05 DIAGNOSIS — Z95828 Presence of other vascular implants and grafts: Secondary | ICD-10-CM

## 2015-01-05 LAB — CBC WITH DIFFERENTIAL/PLATELET
BASO%: 0.3 % (ref 0.0–2.0)
Basophils Absolute: 0 10*3/uL (ref 0.0–0.1)
EOS ABS: 0.1 10*3/uL (ref 0.0–0.5)
EOS%: 2.4 % (ref 0.0–7.0)
HCT: 31.4 % — ABNORMAL LOW (ref 34.8–46.6)
HGB: 10.2 g/dL — ABNORMAL LOW (ref 11.6–15.9)
LYMPH%: 16 % (ref 14.0–49.7)
MCH: 32.2 pg (ref 25.1–34.0)
MCHC: 32.5 g/dL (ref 31.5–36.0)
MCV: 99.1 fL (ref 79.5–101.0)
MONO#: 0.5 10*3/uL (ref 0.1–0.9)
MONO%: 12.2 % (ref 0.0–14.0)
NEUT#: 2.6 10*3/uL (ref 1.5–6.5)
NEUT%: 69.1 % (ref 38.4–76.8)
PLATELETS: 130 10*3/uL — AB (ref 145–400)
RBC: 3.17 10*6/uL — AB (ref 3.70–5.45)
RDW: 19.1 % — ABNORMAL HIGH (ref 11.2–14.5)
WBC: 3.7 10*3/uL — AB (ref 3.9–10.3)
lymph#: 0.6 10*3/uL — ABNORMAL LOW (ref 0.9–3.3)

## 2015-01-05 LAB — COMPREHENSIVE METABOLIC PANEL
ALT: 15 U/L (ref 0–55)
ANION GAP: 15 meq/L — AB (ref 3–11)
AST: 16 U/L (ref 5–34)
Albumin: 3.4 g/dL — ABNORMAL LOW (ref 3.5–5.0)
Alkaline Phosphatase: 79 U/L (ref 40–150)
BUN: 18.6 mg/dL (ref 7.0–26.0)
CHLORIDE: 107 meq/L (ref 98–109)
CO2: 21 meq/L — AB (ref 22–29)
Calcium: 9.5 mg/dL (ref 8.4–10.4)
Creatinine: 1.3 mg/dL — ABNORMAL HIGH (ref 0.6–1.1)
EGFR: 43 mL/min/{1.73_m2} — AB (ref >90)
GLUCOSE: 151 mg/dL — AB (ref 70–140)
Potassium: 4 mEq/L (ref 3.5–5.1)
SODIUM: 143 meq/L (ref 136–145)
Total Bilirubin: 0.47 mg/dL (ref 0.20–1.20)
Total Protein: 7.6 g/dL (ref 6.4–8.3)

## 2015-01-05 MED ORDER — FAMOTIDINE IN NACL 20-0.9 MG/50ML-% IV SOLN
20.0000 mg | Freq: Once | INTRAVENOUS | Status: AC
  Administered 2015-01-05: 20 mg via INTRAVENOUS

## 2015-01-05 MED ORDER — LORAZEPAM 2 MG/ML IJ SOLN: 0.5000 mg | Freq: Once | INTRAMUSCULAR | Status: DC

## 2015-01-05 MED ORDER — DIPHENHYDRAMINE HCL 50 MG/ML IJ SOLN
50.0000 mg | Freq: Once | INTRAMUSCULAR | Status: AC
  Administered 2015-01-05: 50 mg via INTRAVENOUS

## 2015-01-05 MED ORDER — SODIUM CHLORIDE 0.9 % IV SOLN
195.2000 mg | Freq: Once | INTRAVENOUS | Status: AC
  Administered 2015-01-05: 200 mg via INTRAVENOUS
  Filled 2015-01-05: qty 20

## 2015-01-05 MED ORDER — SODIUM CHLORIDE 0.9 % IV SOLN
Freq: Once | INTRAVENOUS | Status: AC
  Administered 2015-01-05: 10:00:00 via INTRAVENOUS
  Filled 2015-01-05: qty 8

## 2015-01-05 MED ORDER — DEXTROSE 5 % IV SOLN
45.0000 mg/m2 | Freq: Once | INTRAVENOUS | Status: AC
  Administered 2015-01-05: 102 mg via INTRAVENOUS
  Filled 2015-01-05: qty 17

## 2015-01-05 MED ORDER — HEPARIN SOD (PORK) LOCK FLUSH 100 UNIT/ML IV SOLN
500.0000 [IU] | Freq: Once | INTRAVENOUS | Status: AC | PRN
  Administered 2015-01-05: 500 [IU]
  Filled 2015-01-05: qty 5

## 2015-01-05 MED ORDER — SODIUM CHLORIDE 0.9 % IV SOLN
Freq: Once | INTRAVENOUS | Status: AC
  Administered 2015-01-05: 10:00:00 via INTRAVENOUS

## 2015-01-05 MED ORDER — HEPARIN SOD (PORK) LOCK FLUSH 100 UNIT/ML IV SOLN
500.0000 [IU] | Freq: Once | INTRAVENOUS | Status: DC
  Filled 2015-01-05: qty 5

## 2015-01-05 MED ORDER — DIPHENHYDRAMINE HCL 50 MG/ML IJ SOLN
INTRAMUSCULAR | Status: AC
  Filled 2015-01-05: qty 1

## 2015-01-05 MED ORDER — FAMOTIDINE IN NACL 20-0.9 MG/50ML-% IV SOLN
INTRAVENOUS | Status: AC
  Filled 2015-01-05: qty 50

## 2015-01-05 MED ORDER — SODIUM CHLORIDE 0.9 % IJ SOLN
10.0000 mL | INTRAMUSCULAR | Status: DC | PRN
  Administered 2015-01-05: 10 mL
  Filled 2015-01-05: qty 10

## 2015-01-05 MED ORDER — SODIUM CHLORIDE 0.9 % IJ SOLN
10.0000 mL | INTRAMUSCULAR | Status: DC | PRN
  Administered 2015-01-05: 10 mL via INTRAVENOUS
  Filled 2015-01-05: qty 10

## 2015-01-05 NOTE — Patient Instructions (Signed)

## 2015-01-05 NOTE — Patient Instructions (Addendum)
Implanted [employer] [facility] [employer] Guide An implanted port is a type of central line that is placed under the skin. Central lines are used to provide IV access when treatment or nutrition needs to be given through a person's veins. Implanted ports are used for long-term IV access. An implanted port may be placed because:  1. You need IV medicine that would be irritating to the small veins in your hands or arms.  2. You need long-term IV medicines, such as antibiotics.  3. You need IV nutrition for a long period.  4. You need frequent blood draws for lab tests.  5. You need dialysis.  Implanted ports are usually placed in the chest area, but they can also be placed in the upper arm, the abdomen, or the leg. An implanted port has two main parts:  1. Reservoir. The reservoir is round and will appear as a small, raised area under your skin. The reservoir is the part where a needle is inserted to give medicines or draw blood.  2. Catheter. The catheter is a thin, flexible tube that extends from the reservoir. The catheter is placed into a large vein. Medicine that is inserted into the reservoir goes into the catheter and then into the vein.  HOW WILL I CARE FOR MY INCISION SITE? Do not get the incision site wet. Bathe or shower as directed by your health care provider.  HOW IS MY PORT ACCESSED? Special steps must be taken to access the port:   Before the port is accessed, a numbing cream can be placed on the skin. This helps numb the skin over the port site.   Your health care provider uses a sterile technique to access the port.  Your health care provider must put on a mask and sterile gloves.  The skin over your port is cleaned carefully with an antiseptic and allowed to dry.  The port is gently pinched between sterile gloves, and a needle is inserted into the port.  Only "non-coring" port needles should be used to access the port. Once the port is accessed, a blood return should be checked. This  helps ensure that the port is in the vein and is not clogged.   If your port needs to remain accessed for a constant infusion, a clear (transparent) bandage will be placed over the needle site. The bandage and needle will need to be changed every week, or as directed by your health care provider.   Keep the bandage covering the needle clean and dry. Do not get it wet. Follow your health care provider's instructions on how to take a shower or bath while the port is accessed.   If your port does not need to stay accessed, no bandage is needed over the port.  WHAT IS FLUSHING? Flushing helps keep the port from getting clogged. Follow your health care provider's instructions on how and when to flush the port. Ports are usually flushed with saline solution or a medicine called heparin. The need for flushing will depend on how the port is used.   If the port is used for intermittent medicines or blood draws, the port will need to be flushed:   After medicines have been given.   After blood has been drawn.   As part of routine maintenance.   If a constant infusion is running, the port may not need to be flushed.  HOW LONG WILL MY PORT STAY IMPLANTED? The port can stay in for as long as your health care  provider thinks it is needed. When it is time for the port to come out, surgery will be done to remove it. The procedure is similar to the one performed when the port was put in.  WHEN SHOULD I SEEK IMMEDIATE MEDICAL CARE? When you have an implanted port, you should seek immediate medical care if:   You notice a bad smell coming from the incision site.   You have swelling, redness, or drainage at the incision site.   You have more swelling or pain at the port site or the surrounding area.   You have a fever that is not controlled with medicine.   This information is not intended to replace advice given to you by your health care provider. Make sure you discuss any questions you  have with your health care provider.   Document Released: 01/15/2005 Document Revised: 11/05/2012 Document Reviewed: 09/22/2012 Elsevier Interactive Patient Education 2016 [place] Discharge Instructions for Patients Receiving Chemotherapy  Today you received the following chemotherapy agents :  Taxol,  Carboplatin.  To help prevent nausea and vomiting after your treatment, we encourage you to take your nausea medication as prescribed.   If you develop nausea and vomiting that is not controlled by your nausea medication, call the clinic.   BELOW ARE SYMPTOMS THAT SHOULD BE REPORTED IMMEDIATELY:  *FEVER GREATER THAN 100.5 F  *CHILLS WITH OR WITHOUT FEVER  NAUSEA AND VOMITING THAT IS NOT CONTROLLED WITH YOUR NAUSEA MEDICATION  *UNUSUAL SHORTNESS OF BREATH  *UNUSUAL BRUISING OR BLEEDING  TENDERNESS IN MOUTH AND THROAT WITH OR WITHOUT PRESENCE OF ULCERS  *URINARY PROBLEMS  *BOWEL PROBLEMS  UNUSUAL RASH Items with * indicate a potential emergency and should be followed up as soon as possible.  Feel free to call the clinic you have any questions or concerns. The clinic phone number is [phone].  Please show the [place] at check-in to the Emergency Department and triage nurse.

## 2015-01-05 NOTE — Addendum Note (Signed)
Addended by: Elray Buba LE on: 01/05/2015 10:34 AM   Modules accepted: Orders

## 2015-01-05 NOTE — Progress Notes (Signed)
Dr. Alvy Bimler reviewed lab results today.  OK to proceed with chemo as ordered per md.  No Avastin today as per Dr. Alvy Bimler.

## 2015-01-07 ENCOUNTER — Encounter: Payer: Self-pay | Admitting: *Deleted

## 2015-01-12 ENCOUNTER — Ambulatory Visit: Payer: No Typology Code available for payment source

## 2015-01-12 ENCOUNTER — Other Ambulatory Visit (HOSPITAL_BASED_OUTPATIENT_CLINIC_OR_DEPARTMENT_OTHER): Payer: No Typology Code available for payment source

## 2015-01-12 ENCOUNTER — Ambulatory Visit (HOSPITAL_BASED_OUTPATIENT_CLINIC_OR_DEPARTMENT_OTHER): Payer: No Typology Code available for payment source

## 2015-01-12 ENCOUNTER — Encounter: Payer: Self-pay | Admitting: Hematology and Oncology

## 2015-01-12 ENCOUNTER — Ambulatory Visit (HOSPITAL_BASED_OUTPATIENT_CLINIC_OR_DEPARTMENT_OTHER): Payer: No Typology Code available for payment source | Admitting: Hematology and Oncology

## 2015-01-12 VITALS — BP 150/81 | HR 68 | Temp 98.2°F | Resp 18

## 2015-01-12 VITALS — BP 162/73 | HR 83 | Temp 98.1°F | Resp 18 | Ht 74.0 in | Wt 238.6 lb

## 2015-01-12 DIAGNOSIS — R05 Cough: Secondary | ICD-10-CM

## 2015-01-12 DIAGNOSIS — C7951 Secondary malignant neoplasm of bone: Secondary | ICD-10-CM | POA: Diagnosis not present

## 2015-01-12 DIAGNOSIS — T451X5A Adverse effect of antineoplastic and immunosuppressive drugs, initial encounter: Secondary | ICD-10-CM

## 2015-01-12 DIAGNOSIS — C3411 Malignant neoplasm of upper lobe, right bronchus or lung: Secondary | ICD-10-CM

## 2015-01-12 DIAGNOSIS — R202 Paresthesia of skin: Secondary | ICD-10-CM

## 2015-01-12 DIAGNOSIS — Z5112 Encounter for antineoplastic immunotherapy: Secondary | ICD-10-CM

## 2015-01-12 DIAGNOSIS — D61810 Antineoplastic chemotherapy induced pancytopenia: Secondary | ICD-10-CM | POA: Diagnosis not present

## 2015-01-12 DIAGNOSIS — Z5111 Encounter for antineoplastic chemotherapy: Secondary | ICD-10-CM

## 2015-01-12 DIAGNOSIS — R0602 Shortness of breath: Secondary | ICD-10-CM | POA: Diagnosis not present

## 2015-01-12 DIAGNOSIS — Z95828 Presence of other vascular implants and grafts: Secondary | ICD-10-CM

## 2015-01-12 DIAGNOSIS — I10 Essential (primary) hypertension: Secondary | ICD-10-CM

## 2015-01-12 DIAGNOSIS — R200 Anesthesia of skin: Secondary | ICD-10-CM | POA: Insufficient documentation

## 2015-01-12 LAB — CBC WITH DIFFERENTIAL/PLATELET
BASO%: 0.3 % (ref 0.0–2.0)
BASOS ABS: 0 10*3/uL (ref 0.0–0.1)
EOS%: 1.6 % (ref 0.0–7.0)
Eosinophils Absolute: 0.1 10*3/uL (ref 0.0–0.5)
HEMATOCRIT: 32 % — AB (ref 34.8–46.6)
HGB: 10.4 g/dL — ABNORMAL LOW (ref 11.6–15.9)
LYMPH#: 0.9 10*3/uL (ref 0.9–3.3)
LYMPH%: 23.1 % (ref 14.0–49.7)
MCH: 32.5 pg (ref 25.1–34.0)
MCHC: 32.5 g/dL (ref 31.5–36.0)
MCV: 100 fL (ref 79.5–101.0)
MONO#: 0.4 10*3/uL (ref 0.1–0.9)
MONO%: 11.4 % (ref 0.0–14.0)
NEUT#: 2.5 10*3/uL (ref 1.5–6.5)
NEUT%: 63.6 % (ref 38.4–76.8)
PLATELETS: 137 10*3/uL — AB (ref 145–400)
RBC: 3.2 10*6/uL — AB (ref 3.70–5.45)
RDW: 18.2 % — ABNORMAL HIGH (ref 11.2–14.5)
WBC: 3.9 10*3/uL (ref 3.9–10.3)

## 2015-01-12 LAB — COMPREHENSIVE METABOLIC PANEL
ALBUMIN: 3.6 g/dL (ref 3.5–5.0)
ALK PHOS: 86 U/L (ref 40–150)
ALT: 14 U/L (ref 0–55)
ANION GAP: 10 meq/L (ref 3–11)
AST: 15 U/L (ref 5–34)
BUN: 15.5 mg/dL (ref 7.0–26.0)
CALCIUM: 9.3 mg/dL (ref 8.4–10.4)
CO2: 22 mEq/L (ref 22–29)
Chloride: 108 mEq/L (ref 98–109)
Creatinine: 1.1 mg/dL (ref 0.6–1.1)
EGFR: 57 mL/min/{1.73_m2} — AB (ref >90)
Glucose: 118 mg/dl (ref 70–140)
POTASSIUM: 4.1 meq/L (ref 3.5–5.1)
Sodium: 141 mEq/L (ref 136–145)
Total Bilirubin: 0.42 mg/dL (ref 0.20–1.20)
Total Protein: 8 g/dL (ref 6.4–8.3)

## 2015-01-12 LAB — UA PROTEIN, DIPSTICK - CHCC: Protein, ur: 100 mg/dL

## 2015-01-12 MED ORDER — SODIUM CHLORIDE 0.9 % IV SOLN
15.0000 mg/kg | Freq: Once | INTRAVENOUS | Status: AC
  Administered 2015-01-12: 1500 mg via INTRAVENOUS
  Filled 2015-01-12: qty 60

## 2015-01-12 MED ORDER — DIPHENHYDRAMINE HCL 50 MG/ML IJ SOLN
50.0000 mg | Freq: Once | INTRAMUSCULAR | Status: AC
  Administered 2015-01-12: 50 mg via INTRAVENOUS

## 2015-01-12 MED ORDER — SODIUM CHLORIDE 0.9 % IV SOLN
Freq: Once | INTRAVENOUS | Status: AC
  Administered 2015-01-12: 10:00:00 via INTRAVENOUS

## 2015-01-12 MED ORDER — SODIUM CHLORIDE 0.9 % IV SOLN
Freq: Once | INTRAVENOUS | Status: AC
  Administered 2015-01-12: 11:00:00 via INTRAVENOUS
  Filled 2015-01-12: qty 8

## 2015-01-12 MED ORDER — HEPARIN SOD (PORK) LOCK FLUSH 100 UNIT/ML IV SOLN
500.0000 [IU] | Freq: Once | INTRAVENOUS | Status: AC | PRN
  Administered 2015-01-12: 500 [IU]
  Filled 2015-01-12: qty 5

## 2015-01-12 MED ORDER — LORAZEPAM 2 MG/ML IJ SOLN: 0.5000 mg | Freq: Once | INTRAMUSCULAR | Status: DC

## 2015-01-12 MED ORDER — PACLITAXEL CHEMO INJECTION 300 MG/50ML
45.0000 mg/m2 | Freq: Once | INTRAVENOUS | Status: AC
  Administered 2015-01-12: 102 mg via INTRAVENOUS
  Filled 2015-01-12: qty 17

## 2015-01-12 MED ORDER — SODIUM CHLORIDE 0.9 % IJ SOLN
10.0000 mL | INTRAMUSCULAR | Status: DC | PRN
  Administered 2015-01-12: 10 mL
  Filled 2015-01-12: qty 10

## 2015-01-12 MED ORDER — SODIUM CHLORIDE 0.9 % IJ SOLN
10.0000 mL | INTRAMUSCULAR | Status: DC | PRN
  Administered 2015-01-12: 10 mL via INTRAVENOUS
  Filled 2015-01-12: qty 10

## 2015-01-12 MED ORDER — FAMOTIDINE IN NACL 20-0.9 MG/50ML-% IV SOLN
20.0000 mg | Freq: Once | INTRAVENOUS | Status: AC
  Administered 2015-01-12: 20 mg via INTRAVENOUS

## 2015-01-12 MED ORDER — DIPHENHYDRAMINE HCL 50 MG/ML IJ SOLN
INTRAMUSCULAR | Status: AC
  Filled 2015-01-12: qty 1

## 2015-01-12 MED ORDER — FAMOTIDINE IN NACL 20-0.9 MG/50ML-% IV SOLN
INTRAVENOUS | Status: AC
  Filled 2015-01-12: qty 50

## 2015-01-12 MED ORDER — SODIUM CHLORIDE 0.9 % IV SOLN
221.8000 mg | Freq: Once | INTRAVENOUS | Status: AC
  Administered 2015-01-12: 220 mg via INTRAVENOUS
  Filled 2015-01-12: qty 22

## 2015-01-12 NOTE — Assessment & Plan Note (Signed)
Her last PET/CT scan in November 2016 show positive response to treatment. However, I am concerned about her new onset of right thoracic/chest wall numbness along the T3 distribution. I recommend proceed with treatment today but I would also want to order an urgent CT scan of the chest next week for further evaluation and exclude disease progression.

## 2015-01-12 NOTE — Assessment & Plan Note (Signed)
She had new onset of numbness and tingling sensation around the T3/T4 right side distribution. Her previous imaging study did not disclose any new bony disease around the thoracic area. She does have history of bone metastases and I'm concerned about disease progression. I recommend we proceed with treatment today but I plan to order CT scan with contrast for further evaluation next week.

## 2015-01-12 NOTE — Patient Instructions (Signed)

## 2015-01-12 NOTE — Progress Notes (Signed)
. 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   12/13/2013 Imaging CT scan show large mass in the right lung apex likely representing primary lung carcinoma or large metastasis. Metastases demonstrated in the pretracheal lymph nodes, T1 vertebral body and possibly sacrum.   12/14/2013 Initial Diagnosis Adenocarcinoma of upper lobe of right lung   12/14/2013 Pathology Results Accession: [id number] biopsy from right upper lobe showed adenocarcinoma. [facility] testing was positive for several mutations without any known treatment options now   12/17/2013 Imaging MRI of the head is negative.   12/23/2013 - 02/05/2014 Radiation Therapy She received radiation therapy to the mediastinal mass and bone   12/28/2013 Imaging PET/CT scan showed mediastinal mass, lymphadenopathy and T1 involvement.   01/01/2014 - 01/29/2014 Chemotherapy Weekly carboplatin and Taxol were added   03/12/2014 Imaging Repeat PET scan showed near complete response to treatment.   03/16/2014 - 05/28/2014 Chemotherapy She started treatment with Alimta for maintenance   07/07/2014 Imaging PET scan showed disease progression   07/15/2014 - 09/23/2014 Chemotherapy She received palliative treatment with Opdivo   08/13/2014 - 08/25/2014 Radiation Therapy She completed palliative radiation therapy to the bone.   10/05/2014 Imaging PET CT showed disease progression except for site  of recent radiation therapy   10/13/2014 -  Chemotherapy She received Rx with weekly carbo/taxol, 2 weeks on 1 week off with Avastin every 3 weeks   12/15/2014 Imaging PET scan showed positive response to Rx    INTERVAL HISTORY: Please see below for problem oriented charting. She returns for further follow-up, prior to chemotherapy today. She had recent nosebleed, resolved. We held her Avastin last visit but she is ready to begin treatment. She had fluctuation of blood pressure. Her blood pressure at home is 136 range. She complained of onset of right-side chest wall/right axillary numbness that had begun 4 days ago. She denies bone pain. Denies other neurological deficit. She denies peripheral neuropathy at the tips of fingers and toes. She have some shortness of breath and mild productive cough. Denies hemoptysis. REVIEW OF SYSTEMS:   Constitutional: Denies fevers, chills or abnormal weight loss Eyes: Denies blurriness of vision Ears, nose, mouth, throat, and face: Denies mucositis or sore throat Cardiovascular: Denies palpitation, chest discomfort or lower extremity swelling Gastrointestinal:  Denies nausea, heartburn or change in bowel habits Skin: Denies abnormal skin rashes Lymphatics: Denies new lymphadenopathy or easy bruising Behavioral/Psych: Mood is stable, no new changes  All other systems were reviewed with the patient and are negative.  I have reviewed the past medical history, past surgical history, social history and family history with the patient and they are unchanged from previous note.  ALLERGIES:  is allergic to codeine; penicillins; and sulfa antibiotics.  MEDICATIONS:  Current Outpatient Prescriptions  Medication Sig Dispense Refill  . albuterol (PROVENTIL HFA;VENTOLIN HFA) 108 (90 BASE) MCG/ACT inhaler Inhale 2 puffs into the lungs every 4 (  four) hours as needed for wheezing or shortness of breath. 1 Inhaler 5  . amLODipine (NORVASC) 10 MG tablet Take 1  tablet (10 mg total) by mouth daily. 90 tablet 0  . fluticasone (FLONASE) 50 MCG/ACT nasal spray Place 2 sprays into both nostrils daily. 16 g 5  . Fluticasone Furoate-Vilanterol (BREO ELLIPTA) 200-25 MCG/INH AEPB Inhale 1 puff into the lungs daily.     Marland Kitchen levothyroxine (SYNTHROID, LEVOTHROID) 200 MCG tablet TAKE 1 TABLET BY MOUTH DAILY BEFORE BREAKFAST. 90 tablet 0  . lidocaine-prilocaine (EMLA) cream Apply 1 application topically as needed. 90 g 0  . LORazepam (ATIVAN) 0.5 MG tablet TAKE 1 TABLET BY MOUTH EVERY 8 HOURS AS NEEDED FOR ANXIETY OR FOR NAUSEA 90 tablet 0  . magic mouthwash w/lidocaine SOLN Take 5 mLs by mouth 4 (four) times daily. Dukes Formula with Nystatin and Lidocaine 1:1 300 mL 0  . mirtazapine (REMERON) 15 MG tablet TAKE 1 TABLET (15 MG TOTAL) BY MOUTH AT BEDTIME AS NEEDED (FOR SLEEP.). 90 tablet 0  . ondansetron (ZOFRAN) 8 MG tablet TAKE 1 TABLET BY MOUTH EVERY 6 HOURS AS NEEDED FOR NAUSEA. 90 tablet 0  . ranitidine (ZANTAC) 150 MG tablet Take 1 tablet (150 mg total) by mouth every evening. 90 tablet 2  . sucralfate (CARAFATE) 1 G tablet Take 1 g by mouth 4 (four) times daily. Reported on 01/12/2015  0   No current facility-administered medications for this visit.   Facility-Administered Medications Ordered in Other Visits  Medication Dose Route Frequency Provider Last Rate Last Dose  . bevacizumab (AVASTIN) 1,500 mg in sodium chloride 0.9 % 100 mL chemo infusion  15 mg/kg (Treatment Plan Actual) Intravenous Once [name], MD      . CARBOplatin (PARAPLATIN) 220 mg in sodium chloride 0.9 % 100 mL chemo infusion  220 mg Intravenous Once [name], MD      . diphenhydrAMINE (BENADRYL) injection 50 mg  50 mg Intravenous Once [name], MD      . famotidine (PEPCID) IVPB 20 mg premix  20 mg Intravenous Once [name], MD      . heparin lock flush 100 unit/mL  500 Units Intracatheter Once PRN [name], MD      . LORazepam (ATIVAN) injection 0.5 mg  0.5 mg Intravenous Once [name], MD   0.5 mg at 01/05/15 1003  . LORazepam (ATIVAN) injection 0.5 mg  0.5 mg Intravenous Once [name], MD      . ondansetron (ZOFRAN) 16 mg, dexamethasone (DECADRON) 10 mg in sodium chloride 0.9 % 50 mL IVPB   Intravenous Once [name], MD      . PACLitaxel (TAXOL) 102 mg in dextrose 5 % 250 mL chemo infusion (</= '80mg'$ /m2)  45 mg/m2 (Treatment Plan Actual) Intravenous Once [name], MD      . sodium chloride 0.9 % injection 10 mL  10 mL Intracatheter PRN [name], MD   10 mL at 01/05/15 1252  . sodium chloride 0.9 % injection 10 mL  10 mL Intracatheter PRN [name], MD        PHYSICAL EXAMINATION: ECOG PERFORMANCE STATUS: 1 - Symptomatic but completely ambulatory  Filed Vitals:   01/12/15 0857  BP: 162/73  Pulse: 83  Temp: 98.1 F (36.7 C)  Resp: 18   Filed Weights   01/12/15 0857  Weight: 238 lb 9.6 oz (108.228 kg)    GENERAL:alert, no distress and comfortable SKIN: skin color, texture, turgor are normal, no rashes or  significant lesions EYES: normal, Conjunctiva are pink and non-injected, sclera clear OROPHARYNX:no exudate, no erythema and lips, buccal mucosa, and tongue normal  NECK: supple, thyroid normal size, non-tender, without nodularity LYMPH:  no palpable lymphadenopathy in the cervical, axillary or inguinal LUNGS: clear to auscultation and percussion with normal breathing effort HEART: regular rate & rhythm and no murmurs and no lower extremity edema ABDOMEN:abdomen soft, non-tender and normal bowel sounds Musculoskeletal:no cyanosis of digits and no clubbing  NEURO: alert & oriented x 3 with fluent speech, no focal motor/sensory deficits  LABORATORY DATA:  I have reviewed the data as listed    Component Value Date/Time   NA 141 01/12/2015 0824   NA 138 06/24/2014 1422   K 4.1 01/12/2015 0824   K 4.0 06/24/2014 1422   CL 96* 06/24/2014 1422   CO2 22 01/12/2015 0824   CO2 30 06/24/2014 1422   GLUCOSE 118 01/12/2015 0824   GLUCOSE 151*  06/24/2014 1422   BUN 15.5 01/12/2015 0824   BUN 13 06/24/2014 1422   CREATININE 1.1 01/12/2015 0824   CREATININE 1.54* 06/24/2014 1422   CALCIUM 9.3 01/12/2015 0824   CALCIUM 9.1 06/24/2014 1422   PROT 8.0 01/12/2015 0824   PROT 7.5 12/13/2013 2018   ALBUMIN 3.6 01/12/2015 0824   ALBUMIN 3.7 12/13/2013 2018   AST 15 01/12/2015 0824   AST 17 12/13/2013 2018   ALT 14 01/12/2015 0824   ALT 14 12/13/2013 2018   ALKPHOS 86 01/12/2015 0824   ALKPHOS 80 12/13/2013 2018   BILITOT 0.42 01/12/2015 0824   BILITOT 0.3 12/13/2013 2018   GFRNONAA 36* 06/24/2014 1422   GFRAA 41* 06/24/2014 1422    No results found for: SPEP, UPEP  Lab Results  Component Value Date   WBC 3.9 01/12/2015   NEUTROABS 2.5 01/12/2015   HGB 10.4* 01/12/2015   HCT 32.0* 01/12/2015   MCV 100.0 01/12/2015   PLT 137* 01/12/2015      Chemistry      Component Value Date/Time   NA 141 01/12/2015 0824   NA 138 06/24/2014 1422   K 4.1 01/12/2015 0824   K 4.0 06/24/2014 1422   CL 96* 06/24/2014 1422   CO2 22 01/12/2015 0824   CO2 30 06/24/2014 1422   BUN 15.5 01/12/2015 0824   BUN 13 06/24/2014 1422   CREATININE 1.1 01/12/2015 0824   CREATININE 1.54* 06/24/2014 1422      Component Value Date/Time   CALCIUM 9.3 01/12/2015 0824   CALCIUM 9.1 06/24/2014 1422   ALKPHOS 86 01/12/2015 0824   ALKPHOS 80 12/13/2013 2018   AST 15 01/12/2015 0824   AST 17 12/13/2013 2018   ALT 14 01/12/2015 0824   ALT 14 12/13/2013 2018   BILITOT 0.42 01/12/2015 0824   BILITOT 0.3 12/13/2013 2018      ASSESSMENT & PLAN:  Adenocarcinoma of upper lobe of right lung Her last PET/CT scan in November 2016 show positive response to treatment. However, I am concerned about her new onset of right thoracic/chest wall numbness along the T3 distribution. I recommend proceed with treatment today but I would also want to order an urgent CT scan of the chest next week for further evaluation and exclude disease  progression.  Pancytopenia due to antineoplastic chemotherapy [facility]) This is likely due to recent treatment. The patient denies recent history of bleeding such as epistaxis, hematuria or hematochezia. She is asymptomatic from the low platelet count. I will observe for now.  she does not require transfusion  now. I will continue the chemotherapy at current dose without dosage adjustment.  If the pancytopenia gets progressive worse in the future, I might have to delay her treatment or adjust the chemotherapy dose.    Essential hypertension  Her blood pressure fluctuated up and down. Recent urinalysis show mild proteinuria. I will continue to monitor her blood pressure carefully. She will continue medical management  I will proceed with Avastin     Numbness and tingling of right arm She had new onset of numbness and tingling sensation around the T3/T4 right side distribution. Her previous imaging study did not disclose any new bony disease around the thoracic area. She does have history of bone metastases and I'm concerned about disease progression. I recommend we proceed with treatment today but I plan to order CT scan with contrast for further evaluation next week.   Orders Placed This Encounter  Procedures  . CT Chest W Contrast    Standing Status: Future     Number of Occurrences:      Standing Expiration Date: 03/13/2016    Order Specific Question:  Reason for Exam (SYMPTOM  OR DIAGNOSIS REQUIRED)    Answer:  lung cancer, new cough, SOB, new T3 level numbness, hx bone mets, exclude disease progression    Order Specific Question:  Is the patient pregnant?    Answer:  No    Order Specific Question:  Preferred imaging location?    Answer:  [facility]   All questions were answered. The patient knows to call the clinic with any problems, questions or concerns. No barriers to learning was detected. I spent 30 minutes counseling the patient face to face. The total time spent  in the appointment was 40 minutes and more than 50% was on counseling and review of test results     [facility], [place], MD 01/12/2015 10:37 AM

## 2015-01-12 NOTE — Assessment & Plan Note (Signed)
This is likely due to recent treatment. The patient denies recent history of bleeding such as epistaxis, hematuria or hematochezia. She is asymptomatic from the low platelet count. I will observe for now.  she does not require transfusion now. I will continue the chemotherapy at current dose without dosage adjustment.  If the pancytopenia gets progressive worse in the future, I might have to delay her treatment or adjust the chemotherapy dose.

## 2015-01-12 NOTE — Patient Instructions (Signed)
[place] Discharge Instructions for Patients Receiving Chemotherapy  Today you received the following chemotherapy agents Taxol, Carboplatin, Avastin  To help prevent nausea and vomiting after your treatment, we encourage you to take your nausea medication     If you develop nausea and vomiting that is not controlled by your nausea medication, call the clinic.   BELOW ARE SYMPTOMS THAT SHOULD BE REPORTED IMMEDIATELY:  *FEVER GREATER THAN 100.5 F  *CHILLS WITH OR WITHOUT FEVER  NAUSEA AND VOMITING THAT IS NOT CONTROLLED WITH YOUR NAUSEA MEDICATION  *UNUSUAL SHORTNESS OF BREATH  *UNUSUAL BRUISING OR BLEEDING  TENDERNESS IN MOUTH AND THROAT WITH OR WITHOUT PRESENCE OF ULCERS  *URINARY PROBLEMS  *BOWEL PROBLEMS  UNUSUAL RASH Items with * indicate a potential emergency and should be followed up as soon as possible.  Feel free to call the clinic you have any questions or concerns. The clinic phone number is [phone].  Please show the [facility] at check-in to the Emergency Department and triage nurse.

## 2015-01-12 NOTE — Assessment & Plan Note (Signed)
Her blood pressure fluctuated up and down. Recent urinalysis show mild proteinuria. I will continue to monitor her blood pressure carefully. She will continue medical management  I will proceed with Avastin

## 2015-01-12 NOTE — Progress Notes (Signed)
Okay to treat today, per Dr. Alvy Bimler. BP okay pre and post Avastin

## 2015-01-14 ENCOUNTER — Telehealth: Payer: Self-pay | Admitting: *Deleted

## 2015-01-14 NOTE — Telephone Encounter (Signed)
-----   Message from [name], MD sent at 01/14/2015 12:51 PM EST ----- Regarding: Ct chest I ordered urgent CT scan to be done next Tues Not scheduled. [facility], can you please call?

## 2015-01-14 NOTE — Telephone Encounter (Signed)
Notified Benedetto Goad in managed care of need for pre cert to get scheduled.   Ct scheduled now.

## 2015-01-18 ENCOUNTER — Telehealth: Payer: Self-pay | Admitting: Hematology and Oncology

## 2015-01-18 ENCOUNTER — Other Ambulatory Visit: Payer: Self-pay | Admitting: Hematology and Oncology

## 2015-01-18 ENCOUNTER — Other Ambulatory Visit: Payer: Self-pay | Admitting: *Deleted

## 2015-01-18 ENCOUNTER — Ambulatory Visit (HOSPITAL_COMMUNITY)
Admission: RE | Admit: 2015-01-18 | Discharge: 2015-01-18 | Disposition: A | Discharge status: Home / Self Care | Payer: No Typology Code available for payment source | Source: Ambulatory Visit | Attending: Hematology and Oncology | Admitting: Hematology and Oncology

## 2015-01-18 ENCOUNTER — Encounter (HOSPITAL_COMMUNITY): Payer: Self-pay

## 2015-01-18 DIAGNOSIS — R590 Localized enlarged lymph nodes: Secondary | ICD-10-CM | POA: Insufficient documentation

## 2015-01-18 DIAGNOSIS — R0602 Shortness of breath: Secondary | ICD-10-CM | POA: Insufficient documentation

## 2015-01-18 DIAGNOSIS — C3411 Malignant neoplasm of upper lobe, right bronchus or lung: Secondary | ICD-10-CM | POA: Diagnosis not present

## 2015-01-18 DIAGNOSIS — R05 Cough: Secondary | ICD-10-CM | POA: Insufficient documentation

## 2015-01-18 DIAGNOSIS — Z85830 Personal history of malignant neoplasm of bone: Secondary | ICD-10-CM | POA: Insufficient documentation

## 2015-01-18 MED ORDER — IOHEXOL 300 MG/ML  SOLN
75.0000 mL | Freq: Once | INTRAMUSCULAR | Status: AC | PRN
  Administered 2015-01-18: 75 mL via INTRAVENOUS

## 2015-01-18 NOTE — Telephone Encounter (Signed)
Per 12/20 pof and message from desk nurse added patient for f/u 12/28 - other 12/28 appointments adjusted accordingly. Message to inf mgr and inf scheduler re adding tx foro 12/28. Left message for patient re change and mailed schedule.

## 2015-01-18 NOTE — Telephone Encounter (Signed)
I reviewed the results of the CT scan with the patient's. Overall, she is responding to the treatment. 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 She is delighted to know of the positive response to treatment. I will proceed to cancel her appointment tomorrow and at an appointment to see her on December 28 prior to chemotherapy to review test results with her. She expressed understanding and I addressed all her questions.

## 2015-01-19 ENCOUNTER — Ambulatory Visit: Payer: No Typology Code available for payment source | Admitting: Hematology and Oncology

## 2015-01-19 NOTE — Telephone Encounter (Signed)
CHEMO FOR 12/28 ADDDED. START TIME REMAINS THE SAME.

## 2015-01-21 ENCOUNTER — Telehealth: Payer: Self-pay | Admitting: Emergency Medicine

## 2015-01-21 MED ORDER — AZITHROMYCIN 250 MG PO TABS: ORAL_TABLET | ORAL | Status: AC

## 2015-01-21 NOTE — Telephone Encounter (Signed)
Pt is aware of MW's recommendation. Rx has been sent in. Nothing further was needed.

## 2015-01-21 NOTE — Telephone Encounter (Signed)
Spoke with pt. Reports coughing and chest congestion. SOB is also present. Denies chest tightness, wheezing or fever. Has been using Mucinex DM with no relief. Would like an antibiotic sent in.  MW - please advise. Thanks.

## 2015-01-21 NOTE — Telephone Encounter (Signed)
[id number] 

## 2015-01-26 ENCOUNTER — Telehealth: Payer: Self-pay | Admitting: Hematology and Oncology

## 2015-01-26 ENCOUNTER — Ambulatory Visit (HOSPITAL_BASED_OUTPATIENT_CLINIC_OR_DEPARTMENT_OTHER): Payer: No Typology Code available for payment source

## 2015-01-26 ENCOUNTER — Other Ambulatory Visit (HOSPITAL_BASED_OUTPATIENT_CLINIC_OR_DEPARTMENT_OTHER): Payer: No Typology Code available for payment source

## 2015-01-26 ENCOUNTER — Other Ambulatory Visit: Payer: No Typology Code available for payment source

## 2015-01-26 ENCOUNTER — Encounter: Payer: Self-pay | Admitting: Hematology and Oncology

## 2015-01-26 ENCOUNTER — Ambulatory Visit (HOSPITAL_BASED_OUTPATIENT_CLINIC_OR_DEPARTMENT_OTHER): Payer: No Typology Code available for payment source | Admitting: Hematology and Oncology

## 2015-01-26 ENCOUNTER — Ambulatory Visit: Payer: No Typology Code available for payment source

## 2015-01-26 VITALS — BP 143/76 | HR 83 | Temp 98.1°F | Resp 18 | Ht 74.0 in | Wt 242.3 lb

## 2015-01-26 DIAGNOSIS — D6481 Anemia due to antineoplastic chemotherapy: Secondary | ICD-10-CM

## 2015-01-26 DIAGNOSIS — Z5111 Encounter for antineoplastic chemotherapy: Secondary | ICD-10-CM

## 2015-01-26 DIAGNOSIS — C3411 Malignant neoplasm of upper lobe, right bronchus or lung: Secondary | ICD-10-CM

## 2015-01-26 DIAGNOSIS — E038 Other specified hypothyroidism: Secondary | ICD-10-CM

## 2015-01-26 DIAGNOSIS — T451X5A Adverse effect of antineoplastic and immunosuppressive drugs, initial encounter: Secondary | ICD-10-CM

## 2015-01-26 DIAGNOSIS — Z95828 Presence of other vascular implants and grafts: Secondary | ICD-10-CM

## 2015-01-26 DIAGNOSIS — G620 Drug-induced polyneuropathy: Secondary | ICD-10-CM

## 2015-01-26 LAB — CBC WITH DIFFERENTIAL/PLATELET
BASO%: 0.5 % (ref 0.0–2.0)
Basophils Absolute: 0 10*3/uL (ref 0.0–0.1)
EOS%: 1.1 % (ref 0.0–7.0)
Eosinophils Absolute: 0.1 10*3/uL (ref 0.0–0.5)
HCT: 33.6 % — ABNORMAL LOW (ref 34.8–46.6)
HGB: 10.7 g/dL — ABNORMAL LOW (ref 11.6–15.9)
LYMPH#: 1.1 10*3/uL (ref 0.9–3.3)
LYMPH%: 24.7 % (ref 14.0–49.7)
MCH: 33 pg (ref 25.1–34.0)
MCHC: 31.8 g/dL (ref 31.5–36.0)
MCV: 103.7 fL — ABNORMAL HIGH (ref 79.5–101.0)
MONO#: 0.6 10*3/uL (ref 0.1–0.9)
MONO%: 13.1 % (ref 0.0–14.0)
NEUT%: 60.6 % (ref 38.4–76.8)
NEUTROS ABS: 2.7 10*3/uL (ref 1.5–6.5)
PLATELETS: 206 10*3/uL (ref 145–400)
RBC: 3.24 10*6/uL — AB (ref 3.70–5.45)
RDW: 18.4 % — ABNORMAL HIGH (ref 11.2–14.5)
WBC: 4.4 10*3/uL (ref 3.9–10.3)

## 2015-01-26 LAB — COMPREHENSIVE METABOLIC PANEL
ALT: 13 U/L (ref 0–55)
ANION GAP: 11 meq/L (ref 3–11)
AST: 16 U/L (ref 5–34)
Albumin: 3.6 g/dL (ref 3.5–5.0)
Alkaline Phosphatase: 89 U/L (ref 40–150)
BILIRUBIN TOTAL: 0.32 mg/dL (ref 0.20–1.20)
BUN: 14.3 mg/dL (ref 7.0–26.0)
CO2: 23 meq/L (ref 22–29)
CREATININE: 1.2 mg/dL — AB (ref 0.6–1.1)
Calcium: 9.7 mg/dL (ref 8.4–10.4)
Chloride: 106 mEq/L (ref 98–109)
EGFR: 51 mL/min/{1.73_m2} — ABNORMAL LOW (ref >90)
GLUCOSE: 127 mg/dL (ref 70–140)
Potassium: 4.4 mEq/L (ref 3.5–5.1)
SODIUM: 141 meq/L (ref 136–145)
TOTAL PROTEIN: 8 g/dL (ref 6.4–8.3)

## 2015-01-26 MED ORDER — FAMOTIDINE IN NACL 20-0.9 MG/50ML-% IV SOLN
20.0000 mg | Freq: Once | INTRAVENOUS | Status: AC
  Administered 2015-01-26: 20 mg via INTRAVENOUS

## 2015-01-26 MED ORDER — SODIUM CHLORIDE 0.9 % IV SOLN
165.9200 mg | Freq: Once | INTRAVENOUS | Status: AC
  Administered 2015-01-26: 170 mg via INTRAVENOUS
  Filled 2015-01-26: qty 17

## 2015-01-26 MED ORDER — SODIUM CHLORIDE 0.9 % IJ SOLN
10.0000 mL | INTRAMUSCULAR | Status: DC | PRN
  Administered 2015-01-26: 10 mL via INTRAVENOUS
  Filled 2015-01-26: qty 10

## 2015-01-26 MED ORDER — FAMOTIDINE IN NACL 20-0.9 MG/50ML-% IV SOLN
INTRAVENOUS | Status: AC
  Filled 2015-01-26: qty 50

## 2015-01-26 MED ORDER — SODIUM CHLORIDE 0.9 % IV SOLN
Freq: Once | INTRAVENOUS | Status: AC
  Administered 2015-01-26: 15:00:00 via INTRAVENOUS
  Filled 2015-01-26: qty 8

## 2015-01-26 MED ORDER — DIPHENHYDRAMINE HCL 50 MG/ML IJ SOLN
50.0000 mg | Freq: Once | INTRAMUSCULAR | Status: AC
  Administered 2015-01-26: 50 mg via INTRAVENOUS

## 2015-01-26 MED ORDER — HEPARIN SOD (PORK) LOCK FLUSH 100 UNIT/ML IV SOLN
500.0000 [IU] | Freq: Once | INTRAVENOUS | Status: AC | PRN
  Administered 2015-01-26: 500 [IU]
  Filled 2015-01-26: qty 5

## 2015-01-26 MED ORDER — LORAZEPAM 2 MG/ML IJ SOLN: 0.5000 mg | Freq: Once | INTRAMUSCULAR | Status: DC

## 2015-01-26 MED ORDER — PACLITAXEL CHEMO INJECTION 300 MG/50ML
36.0000 mg/m2 | Freq: Once | INTRAVENOUS | Status: AC
  Administered 2015-01-26: 84 mg via INTRAVENOUS
  Filled 2015-01-26: qty 14

## 2015-01-26 MED ORDER — DIPHENHYDRAMINE HCL 50 MG/ML IJ SOLN
INTRAMUSCULAR | Status: AC
  Filled 2015-01-26: qty 1

## 2015-01-26 MED ORDER — SODIUM CHLORIDE 0.9 % IV SOLN
Freq: Once | INTRAVENOUS | Status: AC
  Administered 2015-01-26: 15:00:00 via INTRAVENOUS

## 2015-01-26 MED ORDER — SODIUM CHLORIDE 0.9 % IJ SOLN
10.0000 mL | INTRAMUSCULAR | Status: DC | PRN
  Administered 2015-01-26: 10 mL
  Filled 2015-01-26: qty 10

## 2015-01-26 NOTE — Telephone Encounter (Signed)
Gave patient avs report and appointments for January  °

## 2015-01-26 NOTE — Assessment & Plan Note (Signed)
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 I will reduce carboplatin and Taxol dosed both by 20% due to this. I will reassess next month

## 2015-01-26 NOTE — Progress Notes (Signed)
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   12/13/2013 Imaging CT scan show large mass in the right lung apex likely representing primary lung carcinoma or large metastasis. Metastases demonstrated in the pretracheal lymph nodes, T1 vertebral body and possibly sacrum.   12/14/2013 Initial Diagnosis Adenocarcinoma of upper lobe of right lung   12/14/2013 Pathology Results Accession: [id number] biopsy from right upper lobe showed adenocarcinoma. [facility] testing was positive for several mutations without any known treatment options now   12/17/2013 Imaging MRI of the head is negative.   12/23/2013 - 02/05/2014 Radiation Therapy She received radiation therapy to the mediastinal mass and bone   12/28/2013 Imaging PET/CT scan showed mediastinal mass, lymphadenopathy and T1 involvement.   01/01/2014 - 01/29/2014 Chemotherapy Weekly carboplatin and Taxol were added   03/12/2014 Imaging Repeat PET scan showed near complete response to treatment.   03/16/2014 - 05/28/2014 Chemotherapy She started treatment with Alimta for maintenance   07/07/2014 Imaging PET scan showed disease progression   07/15/2014 - 09/23/2014 Chemotherapy She received palliative treatment with Opdivo   08/13/2014 - 08/25/2014 Radiation Therapy She completed palliative radiation therapy to the bone.   10/05/2014 Imaging PET CT showed disease progression except for site of  recent radiation therapy   10/13/2014 -  Chemotherapy She received Rx with weekly carbo/taxol, 2 weeks on 1 week off with Avastin every 3 weeks   12/15/2014 Imaging PET scan showed positive response to Rx   01/18/2015 Imaging Ct chest showed disease improvement    INTERVAL HISTORY: Please see below for problem oriented charting. She is seen today before chemotherapy and to review test results. She continued to have numbness and tingling over the right T3 distribution and peripheral neuropathy. She felt weak and deconditioned overall. She has gained a lot a weight.  REVIEW OF SYSTEMS:   Constitutional: Denies fevers, chills Eyes: Denies blurriness of vision Ears, nose, mouth, throat, and face: Denies mucositis or sore throat Respiratory: Denies cough, dyspnea or wheezes Cardiovascular: Denies palpitation, chest discomfort or lower extremity swelling Gastrointestinal:  Denies nausea, heartburn or change in bowel habits Skin: Denies abnormal skin rashes Lymphatics: Denies new lymphadenopathy or easy bruising Behavioral/Psych: Mood is stable, no new changes  All other systems were reviewed with the patient and are negative.  I have reviewed the past medical history, past surgical history, social history and family history with the patient and they are unchanged from previous note.  ALLERGIES:  is allergic to codeine; penicillins; and sulfa antibiotics.  MEDICATIONS:  Current Outpatient Prescriptions  Medication Sig Dispense Refill  . albuterol (PROVENTIL HFA;VENTOLIN HFA) 108 (90 BASE) MCG/ACT inhaler Inhale 2 puffs into the lungs every 4 (four) hours as needed for wheezing or shortness of breath. 1 Inhaler 5  . amLODipine (NORVASC) 10 MG tablet Take 1 tablet (10 mg total) by mouth daily. 90 tablet 0  . azithromycin (ZITHROMAX Z-PAK) 250  MG tablet Take 2 tablets (500 mg) on  Day 1,  followed by 1 tablet (250 mg) once daily on Days 2 through 5. 6 each 0  . fluticasone (FLONASE) 50  MCG/ACT nasal spray Place 2 sprays into both nostrils daily. 16 g 5  . Fluticasone Furoate-Vilanterol (BREO ELLIPTA) 200-25 MCG/INH AEPB Inhale 1 puff into the lungs daily.     Marland Kitchen levothyroxine (SYNTHROID, LEVOTHROID) 200 MCG tablet TAKE 1 TABLET BY MOUTH DAILY BEFORE BREAKFAST. 90 tablet 0  . lidocaine-prilocaine (EMLA) cream Apply 1 application topically as needed. 90 g 0  . LORazepam (ATIVAN) 0.5 MG tablet TAKE 1 TABLET BY MOUTH EVERY 8 HOURS AS NEEDED FOR ANXIETY OR FOR NAUSEA 90 tablet 0  . magic mouthwash w/lidocaine SOLN Take 5 mLs by mouth 4 (four) times daily. Dukes Formula with Nystatin and Lidocaine 1:1 300 mL 0  . mirtazapine (REMERON) 15 MG tablet TAKE 1 TABLET (15 MG TOTAL) BY MOUTH AT BEDTIME AS NEEDED (FOR SLEEP.). 90 tablet 0  . ondansetron (ZOFRAN) 8 MG tablet TAKE 1 TABLET BY MOUTH EVERY 6 HOURS AS NEEDED FOR NAUSEA. 90 tablet 0  . ranitidine (ZANTAC) 150 MG tablet Take 1 tablet (150 mg total) by mouth every evening. 90 tablet 2  . sucralfate (CARAFATE) 1 G tablet Take 1 g by mouth 4 (four) times daily. Reported on 01/12/2015  0   No current facility-administered medications for this visit.   Facility-Administered Medications Ordered in Other Visits  Medication Dose Route Frequency Provider Last Rate Last Dose  . 0.9 %  sodium chloride infusion   Intravenous Once [name], MD      . CARBOplatin (PARAPLATIN) 170 mg in sodium chloride 0.9 % 100 mL chemo infusion  170 mg Intravenous Once [name], MD      . diphenhydrAMINE (BENADRYL) injection 50 mg  50 mg Intravenous Once [name], MD      . famotidine (PEPCID) IVPB 20 mg premix  20 mg Intravenous Once [name], MD      . heparin lock flush 100 unit/mL  500 Units Intracatheter Once PRN [name], MD      . LORazepam (ATIVAN) injection 0.5 mg  0.5 mg Intravenous Once [name], MD   0.5 mg at 01/05/15 1003  . LORazepam (ATIVAN) injection 0.5 mg  0.5 mg Intravenous Once [name], MD      . ondansetron (ZOFRAN) 16 mg,  dexamethasone (DECADRON) 2 mg in sodium chloride 0.9 % 50 mL IVPB   Intravenous Once [name], MD      . PACLitaxel (TAXOL) 84 mg in dextrose 5 % 250 mL chemo infusion (</= '80mg'$ /m2)  36 mg/m2 (Treatment Plan Actual) Intravenous Once [name], MD      . sodium chloride 0.9 % injection 10 mL  10 mL Intracatheter PRN [name], MD   10 mL at 01/05/15 1252  . sodium chloride 0.9 % injection 10 mL  10 mL Intracatheter PRN [name], MD        PHYSICAL EXAMINATION: ECOG PERFORMANCE STATUS: 1 - Symptomatic but completely ambulatory  Filed Vitals:   01/26/15 1138  BP: 143/76  Pulse: 83  Temp: 98.1 F (36.7 C)  Resp: 18   Filed Weights   01/26/15 1138  Weight: 242 lb 4.8 oz (109.907 kg)    GENERAL:alert, no distress and comfortable. She is obese and appear cushingoid SKIN: skin color, texture, turgor are normal, no rashes or significant lesions EYES: normal, Conjunctiva are pink and non-injected,  sclera clear OROPHARYNX:no exudate, no erythema and lips, buccal mucosa, and tongue normal  NECK: supple, thyroid normal size, non-tender, without nodularity LYMPH:  no palpable lymphadenopathy in the cervical, axillary or inguinal LUNGS: clear to auscultation and percussion with normal breathing effort HEART: regular rate & rhythm and no murmurs and no lower extremity edema ABDOMEN:abdomen soft, non-tender and normal bowel sounds Musculoskeletal:no cyanosis of digits and no clubbing  NEURO: alert & oriented x 3 with fluent speech, no focal motor/sensory deficits  LABORATORY DATA:  I have reviewed the data as listed    Component Value Date/Time   NA 141 01/26/2015 1109   NA 138 06/24/2014 1422   K 4.4 01/26/2015 1109   K 4.0 06/24/2014 1422   CL 96* 06/24/2014 1422   CO2 23 01/26/2015 1109   CO2 30 06/24/2014 1422   GLUCOSE 127 01/26/2015 1109   GLUCOSE 151* 06/24/2014 1422   BUN 14.3 01/26/2015 1109   BUN 13 06/24/2014 1422   CREATININE 1.2* 01/26/2015 1109   CREATININE 1.54*  06/24/2014 1422   CALCIUM 9.7 01/26/2015 1109   CALCIUM 9.1 06/24/2014 1422   PROT 8.0 01/26/2015 1109   PROT 7.5 12/13/2013 2018   ALBUMIN 3.6 01/26/2015 1109   ALBUMIN 3.7 12/13/2013 2018   AST 16 01/26/2015 1109   AST 17 12/13/2013 2018   ALT 13 01/26/2015 1109   ALT 14 12/13/2013 2018   ALKPHOS 89 01/26/2015 1109   ALKPHOS 80 12/13/2013 2018   BILITOT 0.32 01/26/2015 1109   BILITOT 0.3 12/13/2013 2018   GFRNONAA 36* 06/24/2014 1422   GFRAA 41* 06/24/2014 1422    No results found for: SPEP, UPEP  Lab Results  Component Value Date   WBC 4.4 01/26/2015   NEUTROABS 2.7 01/26/2015   HGB 10.7* 01/26/2015   HCT 33.6* 01/26/2015   MCV 103.7* 01/26/2015   PLT 206 01/26/2015      Chemistry      Component Value Date/Time   NA 141 01/26/2015 1109   NA 138 06/24/2014 1422   K 4.4 01/26/2015 1109   K 4.0 06/24/2014 1422   CL 96* 06/24/2014 1422   CO2 23 01/26/2015 1109   CO2 30 06/24/2014 1422   BUN 14.3 01/26/2015 1109   BUN 13 06/24/2014 1422   CREATININE 1.2* 01/26/2015 1109   CREATININE 1.54* 06/24/2014 1422      Component Value Date/Time   CALCIUM 9.7 01/26/2015 1109   CALCIUM 9.1 06/24/2014 1422   ALKPHOS 89 01/26/2015 1109   ALKPHOS 80 12/13/2013 2018   AST 16 01/26/2015 1109   AST 17 12/13/2013 2018   ALT 13 01/26/2015 1109   ALT 14 12/13/2013 2018   BILITOT 0.32 01/26/2015 1109   BILITOT 0.3 12/13/2013 2018       RADIOGRAPHIC STUDIES: I reviewed the CT scan with the patient I have personally reviewed the radiological images as listed and agreed with the findings in the report.    ASSESSMENT & PLAN:  Adenocarcinoma of upper lobe of right lung  Recent CT scan showed positive response to treatment. However, she is getting mild worsening neuropathy. She is also concerned about deconditioning and weight gain. I will reduce her dose of steroids down to minimum 2 mg before treatment. I will reduce both carboplatin and Taxol by 20% due to worsening  neuropathy. I plan to continue treatment 2 weeks on 1 week off and to repeat CT scan again in 3 months.  Anemia due to antineoplastic chemotherapy This is likely due  to recent treatment. The patient denies recent history of bleeding such as epistaxis, hematuria or hematochezia. She is asymptomatic from the anemia. I will observe for now.  She does not require transfusion now. I will continue the chemotherapy at current dose without dosage adjustment.  If the anemia gets progressive worse in the future, I might have to delay her treatment or adjust the chemotherapy dose.   Neuropathy due to chemotherapeutic drug [facility])  She is getting worsening neuropathy from treatment. I will reduce carboplatin and Taxol dosed both by 20% due to this. I will reassess next month   Orders Placed This Encounter  Procedures  . T4, free    Standing Status: Future     Number of Occurrences:      Standing Expiration Date: 03/01/2016  . TSH    Standing Status: Future     Number of Occurrences:      Standing Expiration Date: 03/01/2016   All questions were answered. The patient knows to call the clinic with any problems, questions or concerns. No barriers to learning was detected. I spent 30 minutes counseling the patient face to face. The total time spent in the appointment was 40 minutes and more than 50% was on counseling and review of test results     [facility] [place], [place], MD 01/26/2015 1:35 PM

## 2015-01-26 NOTE — Patient Instructions (Signed)
[facility] Discharge Instructions for Patients Receiving Chemotherapy  Today you received the following chemotherapy agents taxol, carbo  To help prevent nausea and vomiting after your treatment, we encourage you to take your nausea medication as needed.   If you develop nausea and vomiting that is not controlled by your nausea medication, call the clinic.   BELOW ARE SYMPTOMS THAT SHOULD BE REPORTED IMMEDIATELY:  *FEVER GREATER THAN 100.5 F  *CHILLS WITH OR WITHOUT FEVER  NAUSEA AND VOMITING THAT IS NOT CONTROLLED WITH YOUR NAUSEA MEDICATION  *UNUSUAL SHORTNESS OF BREATH  *UNUSUAL BRUISING OR BLEEDING  TENDERNESS IN MOUTH AND THROAT WITH OR WITHOUT PRESENCE OF ULCERS  *URINARY PROBLEMS  *BOWEL PROBLEMS  UNUSUAL RASH Items with * indicate a potential emergency and should be followed up as soon as possible.  Feel free to call the clinic you have any questions or concerns. The clinic phone number is [phone].  Please show the [place] at check-in to the Emergency Department and triage nurse.  Paclitaxel injection What is this medicine? PACLITAXEL (PAK li TAX el) is a chemotherapy drug. It targets fast dividing cells, like cancer cells, and causes these cells to die. This medicine is used to treat ovarian cancer, breast cancer, and other cancers. This medicine may be used for other purposes; ask your health care provider or pharmacist if you have questions. What should I tell my health care provider before I take this medicine? They need to know if you have any of these conditions: -blood disorders -irregular heartbeat -infection (especially a virus infection such as chickenpox, cold sores, or herpes) -liver disease -previous or ongoing radiation therapy -an unusual or allergic reaction to paclitaxel, alcohol, polyoxyethylated castor oil, other chemotherapy agents, other medicines, foods, dyes, or preservatives -pregnant or trying to get  pregnant -breast-feeding How should I use this medicine? This drug is given as an infusion into a vein. It is administered in a hospital or clinic by a specially trained health care professional. Talk to your pediatrician regarding the use of this medicine in children. Special care may be needed. Overdosage: If you think you have taken too much of this medicine contact a poison control center or emergency room at once. NOTE: This medicine is only for you. Do not share this medicine with others. What if I miss a dose? It is important not to miss your dose. Call your doctor or health care professional if you are unable to keep an appointment. What may interact with this medicine? Do not take this medicine with any of the following medications: -disulfiram -metronidazole This medicine may also interact with the following medications: -cyclosporine -diazepam -ketoconazole -medicines to increase blood counts like filgrastim, pegfilgrastim, sargramostim -other chemotherapy drugs like cisplatin, doxorubicin, epirubicin, etoposide, teniposide, vincristine -quinidine -testosterone -vaccines -verapamil Talk to your doctor or health care professional before taking any of these medicines: -acetaminophen -aspirin -ibuprofen -ketoprofen -naproxen This list may not describe all possible interactions. Give your health care provider a list of all the medicines, herbs, non-prescription drugs, or dietary supplements you use. Also tell them if you smoke, drink alcohol, or use illegal drugs. Some items may interact with your medicine. What should I watch for while using this medicine? Your condition will be monitored carefully while you are receiving this medicine. You will need important blood work done while you are taking this medicine. This drug may make you feel generally unwell. This is not uncommon, as chemotherapy can affect healthy cells as well as  cancer cells. Report any side effects. Continue  your course of treatment even though you feel ill unless your doctor tells you to stop. This medicine can cause serious allergic reactions. To reduce your risk you will need to take other medicine(s) before treatment with this medicine. In some cases, you may be given additional medicines to help with side effects. Follow all directions for their use. Call your doctor or health care professional for advice if you get a fever, chills or sore throat, or other symptoms of a cold or flu. Do not treat yourself. This drug decreases your body's ability to fight infections. Try to avoid being around people who are sick. This medicine may increase your risk to bruise or bleed. Call your doctor or health care professional if you notice any unusual bleeding. Be careful brushing and flossing your teeth or using a toothpick because you may get an infection or bleed more easily. If you have any dental work done, tell your dentist you are receiving this medicine. Avoid taking products that contain aspirin, acetaminophen, ibuprofen, naproxen, or ketoprofen unless instructed by your doctor. These medicines may hide a fever. Do not become pregnant while taking this medicine. Women should inform their doctor if they wish to become pregnant or think they might be pregnant. There is a potential for serious side effects to an unborn child. Talk to your health care professional or pharmacist for more information. Do not breast-feed an infant while taking this medicine. Men are advised not to father a child while receiving this medicine. This product may contain alcohol. Ask your pharmacist or healthcare provider if this medicine contains alcohol. Be sure to tell all healthcare providers you are taking this medicine. Certain medicines, like metronidazole and disulfiram, can cause an unpleasant reaction when taken with alcohol. The reaction includes flushing, headache, nausea, vomiting, sweating, and increased thirst. The reaction  can last from 30 minutes to several hours. What side effects may I notice from receiving this medicine? Side effects that you should report to your doctor or health care professional as soon as possible: -allergic reactions like skin rash, itching or hives, swelling of the face, lips, or tongue -low blood counts - This drug may decrease the number of white blood cells, red blood cells and platelets. You may be at increased risk for infections and bleeding. -signs of infection - fever or chills, cough, sore throat, pain or difficulty passing urine -signs of decreased platelets or bleeding - bruising, pinpoint red spots on the skin, black, tarry stools, nosebleeds -signs of decreased red blood cells - unusually weak or tired, fainting spells, lightheadedness -breathing problems -chest pain -high or low blood pressure -mouth sores -nausea and vomiting -pain, swelling, redness or irritation at the injection site -pain, tingling, numbness in the hands or feet -slow or irregular heartbeat -swelling of the ankle, feet, hands Side effects that usually do not require medical attention (report to your doctor or health care professional if they continue or are bothersome): -bone pain -complete hair loss including hair on your head, underarms, pubic hair, eyebrows, and eyelashes -changes in the color of fingernails -diarrhea -loosening of the fingernails -loss of appetite -muscle or joint pain -red flush to skin -sweating This list may not describe all possible side effects. Call your doctor for medical advice about side effects. You may report side effects to FDA at 1-800-FDA-1088. Where should I keep my medicine? This drug is given in a hospital or clinic and will not be stored at home.  NOTE: This sheet is a summary. It may not cover all possible information. If you have questions about this medicine, talk to your doctor, pharmacist, or health care provider.    2016, [employer].  (2014-09-02 13:02:56)   Carboplatin injection What is this medicine? CARBOPLATIN (KAR boe pla tin) is a chemotherapy drug. It targets fast dividing cells, like cancer cells, and causes these cells to die. This medicine is used to treat ovarian cancer and many other cancers. This medicine may be used for other purposes; ask your health care provider or pharmacist if you have questions. What should I tell my health care provider before I take this medicine? They need to know if you have any of these conditions: -blood disorders -hearing problems -kidney disease -recent or ongoing radiation therapy -an unusual or allergic reaction to carboplatin, cisplatin, other chemotherapy, other medicines, foods, dyes, or preservatives -pregnant or trying to get pregnant -breast-feeding How should I use this medicine? This drug is usually given as an infusion into a vein. It is administered in a hospital or clinic by a specially trained health care professional. Talk to your pediatrician regarding the use of this medicine in children. Special care may be needed. Overdosage: If you think you have taken too much of this medicine contact a poison control center or emergency room at once. NOTE: This medicine is only for you. Do not share this medicine with others. What if I miss a dose? It is important not to miss a dose. Call your doctor or health care professional if you are unable to keep an appointment. What may interact with this medicine? -medicines for seizures -medicines to increase blood counts like filgrastim, pegfilgrastim, sargramostim -some antibiotics like amikacin, gentamicin, neomycin, streptomycin, tobramycin -vaccines Talk to your doctor or health care professional before taking any of these medicines: -acetaminophen -aspirin -ibuprofen -ketoprofen -naproxen This list may not describe all possible interactions. Give your health care provider a list of all the medicines, herbs,  non-prescription drugs, or dietary supplements you use. Also tell them if you smoke, drink alcohol, or use illegal drugs. Some items may interact with your medicine. What should I watch for while using this medicine? Your condition will be monitored carefully while you are receiving this medicine. You will need important blood work done while you are taking this medicine. This drug may make you feel generally unwell. This is not uncommon, as chemotherapy can affect healthy cells as well as cancer cells. Report any side effects. Continue your course of treatment even though you feel ill unless your doctor tells you to stop. In some cases, you may be given additional medicines to help with side effects. Follow all directions for their use. Call your doctor or health care professional for advice if you get a fever, chills or sore throat, or other symptoms of a cold or flu. Do not treat yourself. This drug decreases your body's ability to fight infections. Try to avoid being around people who are sick. This medicine may increase your risk to bruise or bleed. Call your doctor or health care professional if you notice any unusual bleeding. Be careful brushing and flossing your teeth or using a toothpick because you may get an infection or bleed more easily. If you have any dental work done, tell your dentist you are receiving this medicine. Avoid taking products that contain aspirin, acetaminophen, ibuprofen, naproxen, or ketoprofen unless instructed by your doctor. These medicines may hide a fever. Do not become pregnant while taking this medicine.  Women should inform their doctor if they wish to become pregnant or think they might be pregnant. There is a potential for serious side effects to an unborn child. Talk to your health care professional or pharmacist for more information. Do not breast-feed an infant while taking this medicine. What side effects may I notice from receiving this medicine? Side effects  that you should report to your doctor or health care professional as soon as possible: -allergic reactions like skin rash, itching or hives, swelling of the face, lips, or tongue -signs of infection - fever or chills, cough, sore throat, pain or difficulty passing urine -signs of decreased platelets or bleeding - bruising, pinpoint red spots on the skin, black, tarry stools, nosebleeds -signs of decreased red blood cells - unusually weak or tired, fainting spells, lightheadedness -breathing problems -changes in hearing -changes in vision -chest pain -high blood pressure -low blood counts - This drug may decrease the number of white blood cells, red blood cells and platelets. You may be at increased risk for infections and bleeding. -nausea and vomiting -pain, swelling, redness or irritation at the injection site -pain, tingling, numbness in the hands or feet -problems with balance, talking, walking -trouble passing urine or change in the amount of urine Side effects that usually do not require medical attention (report to your doctor or health care professional if they continue or are bothersome): -hair loss -loss of appetite -metallic taste in the mouth or changes in taste This list may not describe all possible side effects. Call your doctor for medical advice about side effects. You may report side effects to FDA at 1-800-FDA-1088. Where should I keep my medicine? This drug is given in a hospital or clinic and will not be stored at home. NOTE: This sheet is a summary. It may not cover all possible information. If you have questions about this medicine, talk to your doctor, pharmacist, or health care provider.    2016, [employer]. (2007-04-22 14:38:05)

## 2015-01-26 NOTE — Patient Instructions (Signed)

## 2015-01-26 NOTE — Assessment & Plan Note (Signed)
Recent CT scan showed positive response to treatment. However, she is getting mild worsening neuropathy. She is also concerned about deconditioning and weight gain. I will reduce her dose of steroids down to minimum 2 mg before treatment. I will reduce both carboplatin and Taxol by 20% due to worsening neuropathy. I plan to continue treatment 2 weeks on 1 week off and to repeat CT scan again in 3 months.

## 2015-01-26 NOTE — Assessment & Plan Note (Signed)

## 2015-02-02 ENCOUNTER — Other Ambulatory Visit (HOSPITAL_BASED_OUTPATIENT_CLINIC_OR_DEPARTMENT_OTHER): Payer: BLUE CROSS/BLUE SHIELD

## 2015-02-02 ENCOUNTER — Ambulatory Visit (HOSPITAL_BASED_OUTPATIENT_CLINIC_OR_DEPARTMENT_OTHER): Payer: BLUE CROSS/BLUE SHIELD

## 2015-02-02 ENCOUNTER — Ambulatory Visit: Payer: BLUE CROSS/BLUE SHIELD

## 2015-02-02 VITALS — BP 134/60 | HR 76 | Temp 98.4°F | Resp 18

## 2015-02-02 DIAGNOSIS — Z95828 Presence of other vascular implants and grafts: Secondary | ICD-10-CM

## 2015-02-02 DIAGNOSIS — C3411 Malignant neoplasm of upper lobe, right bronchus or lung: Secondary | ICD-10-CM

## 2015-02-02 DIAGNOSIS — Z79899 Other long term (current) drug therapy: Secondary | ICD-10-CM

## 2015-02-02 DIAGNOSIS — Z5112 Encounter for antineoplastic immunotherapy: Secondary | ICD-10-CM | POA: Diagnosis not present

## 2015-02-02 DIAGNOSIS — Z5111 Encounter for antineoplastic chemotherapy: Secondary | ICD-10-CM | POA: Diagnosis not present

## 2015-02-02 DIAGNOSIS — E038 Other specified hypothyroidism: Secondary | ICD-10-CM

## 2015-02-02 LAB — CBC WITH DIFFERENTIAL/PLATELET
BASO%: 0.3 % (ref 0.0–2.0)
BASOS ABS: 0 10*3/uL (ref 0.0–0.1)
EOS%: 1.5 % (ref 0.0–7.0)
Eosinophils Absolute: 0.1 10*3/uL (ref 0.0–0.5)
HEMATOCRIT: 33.3 % — AB (ref 34.8–46.6)
HGB: 10.6 g/dL — ABNORMAL LOW (ref 11.6–15.9)
LYMPH#: 1.1 10*3/uL (ref 0.9–3.3)
LYMPH%: 18.6 % (ref 14.0–49.7)
MCH: 33 pg (ref 25.1–34.0)
MCHC: 31.8 g/dL (ref 31.5–36.0)
MCV: 103.7 fL — ABNORMAL HIGH (ref 79.5–101.0)
MONO#: 0.6 10*3/uL (ref 0.1–0.9)
MONO%: 9.2 % (ref 0.0–14.0)
NEUT#: 4.2 10*3/uL (ref 1.5–6.5)
NEUT%: 70.4 % (ref 38.4–76.8)
PLATELETS: 197 10*3/uL (ref 145–400)
RBC: 3.21 10*6/uL — ABNORMAL LOW (ref 3.70–5.45)
RDW: 17.5 % — ABNORMAL HIGH (ref 11.2–14.5)
WBC: 6 10*3/uL (ref 3.9–10.3)

## 2015-02-02 LAB — UA PROTEIN, DIPSTICK - CHCC: Protein, ur: 100 mg/dL

## 2015-02-02 LAB — COMPREHENSIVE METABOLIC PANEL
ALT: 13 U/L (ref 0–55)
ANION GAP: 9 meq/L (ref 3–11)
AST: 15 U/L (ref 5–34)
Albumin: 3.7 g/dL (ref 3.5–5.0)
Alkaline Phosphatase: 86 U/L (ref 40–150)
BILIRUBIN TOTAL: 0.39 mg/dL (ref 0.20–1.20)
BUN: 17.7 mg/dL (ref 7.0–26.0)
CALCIUM: 9.4 mg/dL (ref 8.4–10.4)
CHLORIDE: 108 meq/L (ref 98–109)
CO2: 24 meq/L (ref 22–29)
CREATININE: 1 mg/dL (ref 0.6–1.1)
EGFR: 59 mL/min/{1.73_m2} — ABNORMAL LOW (ref >90)
Glucose: 111 mg/dl (ref 70–140)
Potassium: 4.2 mEq/L (ref 3.5–5.1)
Sodium: 142 mEq/L (ref 136–145)
TOTAL PROTEIN: 7.9 g/dL (ref 6.4–8.3)

## 2015-02-02 LAB — TSH: TSH: 9.783 m(IU)/L — ABNORMAL HIGH (ref 0.308–3.960)

## 2015-02-02 LAB — T4, FREE: Free T4: 1.2 ng/dL (ref 0.80–1.80)

## 2015-02-02 MED ORDER — SODIUM CHLORIDE 0.9 % IJ SOLN
10.0000 mL | INTRAMUSCULAR | Status: DC | PRN
  Administered 2015-02-02: 10 mL
  Filled 2015-02-02: qty 10

## 2015-02-02 MED ORDER — BEVACIZUMAB CHEMO INJECTION 400 MG/16ML
15.0000 mg/kg | Freq: Once | INTRAVENOUS | Status: AC
  Administered 2015-02-02: 1500 mg via INTRAVENOUS
  Filled 2015-02-02: qty 60

## 2015-02-02 MED ORDER — SODIUM CHLORIDE 0.9 % IJ SOLN
10.0000 mL | INTRAMUSCULAR | Status: DC | PRN
  Administered 2015-02-02: 10 mL via INTRAVENOUS
  Filled 2015-02-02: qty 10

## 2015-02-02 MED ORDER — FAMOTIDINE IN NACL 20-0.9 MG/50ML-% IV SOLN
INTRAVENOUS | Status: AC
  Filled 2015-02-02: qty 50

## 2015-02-02 MED ORDER — PACLITAXEL CHEMO INJECTION 300 MG/50ML
36.0000 mg/m2 | Freq: Once | INTRAVENOUS | Status: AC
  Administered 2015-02-02: 84 mg via INTRAVENOUS
  Filled 2015-02-02: qty 14

## 2015-02-02 MED ORDER — SODIUM CHLORIDE 0.9 % IV SOLN
165.9200 mg | Freq: Once | INTRAVENOUS | Status: AC
  Administered 2015-02-02: 170 mg via INTRAVENOUS
  Filled 2015-02-02: qty 17

## 2015-02-02 MED ORDER — FAMOTIDINE IN NACL 20-0.9 MG/50ML-% IV SOLN
20.0000 mg | Freq: Once | INTRAVENOUS | Status: AC
  Administered 2015-02-02: 20 mg via INTRAVENOUS

## 2015-02-02 MED ORDER — SODIUM CHLORIDE 0.9 % IV SOLN
Freq: Once | INTRAVENOUS | Status: AC
  Administered 2015-02-02: 11:00:00 via INTRAVENOUS

## 2015-02-02 MED ORDER — DIPHENHYDRAMINE HCL 50 MG/ML IJ SOLN
50.0000 mg | Freq: Once | INTRAMUSCULAR | Status: AC
Start: 2015-02-02 — End: 2015-02-02
  Administered 2015-02-02: 50 mg via INTRAVENOUS

## 2015-02-02 MED ORDER — DIPHENHYDRAMINE HCL 50 MG/ML IJ SOLN
INTRAMUSCULAR | Status: AC
  Filled 2015-02-02: qty 1

## 2015-02-02 MED ORDER — SODIUM CHLORIDE 0.9 % IV SOLN
Freq: Once | INTRAVENOUS | Status: AC
  Administered 2015-02-02: 12:00:00 via INTRAVENOUS
  Filled 2015-02-02: qty 8

## 2015-02-02 MED ORDER — HEPARIN SOD (PORK) LOCK FLUSH 100 UNIT/ML IV SOLN
500.0000 [IU] | Freq: Once | INTRAVENOUS | Status: AC | PRN
  Administered 2015-02-02: 500 [IU]
  Filled 2015-02-02: qty 5

## 2015-02-02 NOTE — Progress Notes (Signed)
Urine protein 100 today. OK to treat with Avastin per Dr. Alvy Bimler.

## 2015-02-02 NOTE — Patient Instructions (Signed)

## 2015-02-02 NOTE — Patient Instructions (Signed)
[facility] Discharge Instructions for Patients Receiving Chemotherapy  Today you received the following chemotherapy agents: Taxol, Carboplatin, Avastin   To help prevent nausea and vomiting after your treatment, we encourage you to take your nausea medication as prescribed.    If you develop nausea and vomiting that is not controlled by your nausea medication, call the clinic.   BELOW ARE SYMPTOMS THAT SHOULD BE REPORTED IMMEDIATELY:  *FEVER GREATER THAN 100.5 F  *CHILLS WITH OR WITHOUT FEVER  NAUSEA AND VOMITING THAT IS NOT CONTROLLED WITH YOUR NAUSEA MEDICATION  *UNUSUAL SHORTNESS OF BREATH  *UNUSUAL BRUISING OR BLEEDING  TENDERNESS IN MOUTH AND THROAT WITH OR WITHOUT PRESENCE OF ULCERS  *URINARY PROBLEMS  *BOWEL PROBLEMS  UNUSUAL RASH Items with * indicate a potential emergency and should be followed up as soon as possible.  Feel free to call the clinic you have any questions or concerns. The clinic phone number is [phone].  Please show the [place] at check-in to the Emergency Department and triage nurse.

## 2015-02-03 ENCOUNTER — Other Ambulatory Visit: Payer: Self-pay | Admitting: Hematology and Oncology

## 2015-02-03 ENCOUNTER — Telehealth: Payer: Self-pay | Admitting: *Deleted

## 2015-02-03 DIAGNOSIS — E038 Other specified hypothyroidism: Secondary | ICD-10-CM | POA: Insufficient documentation

## 2015-02-03 NOTE — Telephone Encounter (Signed)
LVM for pt informing her of message below.  Asked her to call nurse back if any questions.

## 2015-02-03 NOTE — Telephone Encounter (Signed)
-----   Message from [name], MD sent at 02/03/2015  9:21 AM EST ----- Regarding: thyroid replacement TSH a bit high but free T4 ok Continue same dose thyroid supplement at 200 mcg ----- Message -----    From: [facility]    Sent: 02/02/2015  10:09 AM      To: [name], MD

## 2015-02-16 ENCOUNTER — Ambulatory Visit (HOSPITAL_BASED_OUTPATIENT_CLINIC_OR_DEPARTMENT_OTHER): Payer: BLUE CROSS/BLUE SHIELD

## 2015-02-16 ENCOUNTER — Other Ambulatory Visit (HOSPITAL_BASED_OUTPATIENT_CLINIC_OR_DEPARTMENT_OTHER): Payer: BLUE CROSS/BLUE SHIELD

## 2015-02-16 ENCOUNTER — Ambulatory Visit: Payer: BLUE CROSS/BLUE SHIELD

## 2015-02-16 VITALS — BP 145/70 | HR 60 | Temp 97.8°F | Resp 20

## 2015-02-16 DIAGNOSIS — Z5111 Encounter for antineoplastic chemotherapy: Secondary | ICD-10-CM | POA: Diagnosis not present

## 2015-02-16 DIAGNOSIS — C3411 Malignant neoplasm of upper lobe, right bronchus or lung: Secondary | ICD-10-CM | POA: Diagnosis not present

## 2015-02-16 DIAGNOSIS — Z95828 Presence of other vascular implants and grafts: Secondary | ICD-10-CM

## 2015-02-16 LAB — CBC WITH DIFFERENTIAL/PLATELET
BASO%: 0.2 % (ref 0.0–2.0)
Basophils Absolute: 0 10*3/uL (ref 0.0–0.1)
EOS ABS: 0.1 10*3/uL (ref 0.0–0.5)
EOS%: 1.7 % (ref 0.0–7.0)
HCT: 34.9 % (ref 34.8–46.6)
HEMOGLOBIN: 11.1 g/dL — AB (ref 11.6–15.9)
LYMPH%: 20.5 % (ref 14.0–49.7)
MCH: 33.3 pg (ref 25.1–34.0)
MCHC: 31.8 g/dL (ref 31.5–36.0)
MCV: 104.8 fL — ABNORMAL HIGH (ref 79.5–101.0)
MONO#: 0.5 10*3/uL (ref 0.1–0.9)
MONO%: 10.9 % (ref 0.0–14.0)
NEUT%: 66.7 % (ref 38.4–76.8)
NEUTROS ABS: 3.2 10*3/uL (ref 1.5–6.5)
Platelets: 178 10*3/uL (ref 145–400)
RBC: 3.33 10*6/uL — ABNORMAL LOW (ref 3.70–5.45)
RDW: 16.2 % — AB (ref 11.2–14.5)
WBC: 4.8 10*3/uL (ref 3.9–10.3)
lymph#: 1 10*3/uL (ref 0.9–3.3)

## 2015-02-16 LAB — COMPREHENSIVE METABOLIC PANEL
ALBUMIN: 3.5 g/dL (ref 3.5–5.0)
ALK PHOS: 81 U/L (ref 40–150)
ALT: 13 U/L (ref 0–55)
AST: 17 U/L (ref 5–34)
Anion Gap: 10 mEq/L (ref 3–11)
BILIRUBIN TOTAL: 0.34 mg/dL (ref 0.20–1.20)
BUN: 13.4 mg/dL (ref 7.0–26.0)
CO2: 23 mEq/L (ref 22–29)
Calcium: 9.3 mg/dL (ref 8.4–10.4)
Chloride: 108 mEq/L (ref 98–109)
Creatinine: 1.1 mg/dL (ref 0.6–1.1)
EGFR: 55 mL/min/{1.73_m2} — ABNORMAL LOW (ref >90)
GLUCOSE: 150 mg/dL — AB (ref 70–140)
Potassium: 3.8 mEq/L (ref 3.5–5.1)
SODIUM: 141 meq/L (ref 136–145)
TOTAL PROTEIN: 7.7 g/dL (ref 6.4–8.3)

## 2015-02-16 MED ORDER — SODIUM CHLORIDE 0.9 % IV SOLN
Freq: Once | INTRAVENOUS | Status: AC
  Administered 2015-02-16: 12:00:00 via INTRAVENOUS

## 2015-02-16 MED ORDER — FAMOTIDINE IN NACL 20-0.9 MG/50ML-% IV SOLN
20.0000 mg | Freq: Once | INTRAVENOUS | Status: AC
  Administered 2015-02-16: 20 mg via INTRAVENOUS

## 2015-02-16 MED ORDER — SODIUM CHLORIDE 0.9 % IV SOLN
165.9200 mg | Freq: Once | INTRAVENOUS | Status: AC
  Administered 2015-02-16: 170 mg via INTRAVENOUS
  Filled 2015-02-16: qty 17

## 2015-02-16 MED ORDER — SODIUM CHLORIDE 0.9 % IV SOLN
Freq: Once | INTRAVENOUS | Status: AC
  Administered 2015-02-16: 12:00:00 via INTRAVENOUS
  Filled 2015-02-16: qty 8

## 2015-02-16 MED ORDER — DIPHENHYDRAMINE HCL 50 MG/ML IJ SOLN
50.0000 mg | Freq: Once | INTRAMUSCULAR | Status: AC
  Administered 2015-02-16: 50 mg via INTRAVENOUS

## 2015-02-16 MED ORDER — HEPARIN SOD (PORK) LOCK FLUSH 100 UNIT/ML IV SOLN
500.0000 [IU] | Freq: Once | INTRAVENOUS | Status: AC | PRN
  Administered 2015-02-16: 500 [IU]
  Filled 2015-02-16: qty 5

## 2015-02-16 MED ORDER — FAMOTIDINE IN NACL 20-0.9 MG/50ML-% IV SOLN
INTRAVENOUS | Status: AC
  Filled 2015-02-16: qty 50

## 2015-02-16 MED ORDER — SODIUM CHLORIDE 0.9 % IJ SOLN
10.0000 mL | INTRAMUSCULAR | Status: DC | PRN
  Administered 2015-02-16: 10 mL
  Filled 2015-02-16: qty 10

## 2015-02-16 MED ORDER — PACLITAXEL CHEMO INJECTION 300 MG/50ML
36.0000 mg/m2 | Freq: Once | INTRAVENOUS | Status: AC
  Administered 2015-02-16: 84 mg via INTRAVENOUS
  Filled 2015-02-16: qty 14

## 2015-02-16 MED ORDER — SODIUM CHLORIDE 0.9 % IJ SOLN
10.0000 mL | INTRAMUSCULAR | Status: DC | PRN
  Administered 2015-02-16: 10 mL via INTRAVENOUS
  Filled 2015-02-16: qty 10

## 2015-02-16 MED ORDER — DIPHENHYDRAMINE HCL 50 MG/ML IJ SOLN
INTRAMUSCULAR | Status: AC
  Filled 2015-02-16: qty 1

## 2015-02-16 NOTE — Patient Instructions (Signed)
[facility] Discharge Instructions for Patients Receiving Chemotherapy  Today you received the following chemotherapy agents taxol, carbo  To help prevent nausea and vomiting after your treatment, we encourage you to take your nausea medication as needed.   If you develop nausea and vomiting that is not controlled by your nausea medication, call the clinic.   BELOW ARE SYMPTOMS THAT SHOULD BE REPORTED IMMEDIATELY:  *FEVER GREATER THAN 100.5 F  *CHILLS WITH OR WITHOUT FEVER  NAUSEA AND VOMITING THAT IS NOT CONTROLLED WITH YOUR NAUSEA MEDICATION  *UNUSUAL SHORTNESS OF BREATH  *UNUSUAL BRUISING OR BLEEDING  TENDERNESS IN MOUTH AND THROAT WITH OR WITHOUT PRESENCE OF ULCERS  *URINARY PROBLEMS  *BOWEL PROBLEMS  UNUSUAL RASH Items with * indicate a potential emergency and should be followed up as soon as possible.  Feel free to call the clinic you have any questions or concerns. The clinic phone number is [phone].  Please show the [facility] at check-in to the Emergency Department and triage nurse.  Paclitaxel injection What is this medicine? PACLITAXEL (PAK li TAX el) is a chemotherapy drug. It targets fast dividing cells, like cancer cells, and causes these cells to die. This medicine is used to treat ovarian cancer, breast cancer, and other cancers. This medicine may be used for other purposes; ask your health care provider or pharmacist if you have questions. What should I tell my health care provider before I take this medicine? They need to know if you have any of these conditions: -blood disorders -irregular heartbeat -infection (especially a virus infection such as chickenpox, cold sores, or herpes) -liver disease -previous or ongoing radiation therapy -an unusual or allergic reaction to paclitaxel, alcohol, polyoxyethylated castor oil, other chemotherapy agents, other medicines, foods, dyes, or preservatives -pregnant or trying to get  pregnant -breast-feeding How should I use this medicine? This drug is given as an infusion into a vein. It is administered in a hospital or clinic by a specially trained health care professional. Talk to your pediatrician regarding the use of this medicine in children. Special care may be needed. Overdosage: If you think you have taken too much of this medicine contact a poison control center or emergency room at once. NOTE: This medicine is only for you. Do not share this medicine with others. What if I miss a dose? It is important not to miss your dose. Call your doctor or health care professional if you are unable to keep an appointment. What may interact with this medicine? Do not take this medicine with any of the following medications: -disulfiram -metronidazole This medicine may also interact with the following medications: -cyclosporine -diazepam -ketoconazole -medicines to increase blood counts like filgrastim, pegfilgrastim, sargramostim -other chemotherapy drugs like cisplatin, doxorubicin, epirubicin, etoposide, teniposide, vincristine -quinidine -testosterone -vaccines -verapamil Talk to your doctor or health care professional before taking any of these medicines: -acetaminophen -aspirin -ibuprofen -ketoprofen -naproxen This list may not describe all possible interactions. Give your health care provider a list of all the medicines, herbs, non-prescription drugs, or dietary supplements you use. Also tell them if you smoke, drink alcohol, or use illegal drugs. Some items may interact with your medicine. What should I watch for while using this medicine? Your condition will be monitored carefully while you are receiving this medicine. You will need important blood work done while you are taking this medicine. This drug may make you feel generally unwell. This is not uncommon, as chemotherapy can affect healthy cells as well as  cancer cells. Report any side effects. Continue  your course of treatment even though you feel ill unless your doctor tells you to stop. This medicine can cause serious allergic reactions. To reduce your risk you will need to take other medicine(s) before treatment with this medicine. In some cases, you may be given additional medicines to help with side effects. Follow all directions for their use. Call your doctor or health care professional for advice if you get a fever, chills or sore throat, or other symptoms of a cold or flu. Do not treat yourself. This drug decreases your body's ability to fight infections. Try to avoid being around people who are sick. This medicine may increase your risk to bruise or bleed. Call your doctor or health care professional if you notice any unusual bleeding. Be careful brushing and flossing your teeth or using a toothpick because you may get an infection or bleed more easily. If you have any dental work done, tell your dentist you are receiving this medicine. Avoid taking products that contain aspirin, acetaminophen, ibuprofen, naproxen, or ketoprofen unless instructed by your doctor. These medicines may hide a fever. Do not become pregnant while taking this medicine. Women should inform their doctor if they wish to become pregnant or think they might be pregnant. There is a potential for serious side effects to an unborn child. Talk to your health care professional or pharmacist for more information. Do not breast-feed an infant while taking this medicine. Men are advised not to father a child while receiving this medicine. This product may contain alcohol. Ask your pharmacist or healthcare provider if this medicine contains alcohol. Be sure to tell all healthcare providers you are taking this medicine. Certain medicines, like metronidazole and disulfiram, can cause an unpleasant reaction when taken with alcohol. The reaction includes flushing, headache, nausea, vomiting, sweating, and increased thirst. The reaction  can last from 30 minutes to several hours. What side effects may I notice from receiving this medicine? Side effects that you should report to your doctor or health care professional as soon as possible: -allergic reactions like skin rash, itching or hives, swelling of the face, lips, or tongue -low blood counts - This drug may decrease the number of white blood cells, red blood cells and platelets. You may be at increased risk for infections and bleeding. -signs of infection - fever or chills, cough, sore throat, pain or difficulty passing urine -signs of decreased platelets or bleeding - bruising, pinpoint red spots on the skin, black, tarry stools, nosebleeds -signs of decreased red blood cells - unusually weak or tired, fainting spells, lightheadedness -breathing problems -chest pain -high or low blood pressure -mouth sores -nausea and vomiting -pain, swelling, redness or irritation at the injection site -pain, tingling, numbness in the hands or feet -slow or irregular heartbeat -swelling of the ankle, feet, hands Side effects that usually do not require medical attention (report to your doctor or health care professional if they continue or are bothersome): -bone pain -complete hair loss including hair on your head, underarms, pubic hair, eyebrows, and eyelashes -changes in the color of fingernails -diarrhea -loosening of the fingernails -loss of appetite -muscle or joint pain -red flush to skin -sweating This list may not describe all possible side effects. Call your doctor for medical advice about side effects. You may report side effects to FDA at 1-800-FDA-1088. Where should I keep my medicine? This drug is given in a hospital or clinic and will not be stored at home.  NOTE: This sheet is a summary. It may not cover all possible information. If you have questions about this medicine, talk to your doctor, pharmacist, or health care provider.    2016, [employer].  (2014-09-02 13:02:56)   Carboplatin injection What is this medicine? CARBOPLATIN (KAR boe pla tin) is a chemotherapy drug. It targets fast dividing cells, like cancer cells, and causes these cells to die. This medicine is used to treat ovarian cancer and many other cancers. This medicine may be used for other purposes; ask your health care provider or pharmacist if you have questions. What should I tell my health care provider before I take this medicine? They need to know if you have any of these conditions: -blood disorders -hearing problems -kidney disease -recent or ongoing radiation therapy -an unusual or allergic reaction to carboplatin, cisplatin, other chemotherapy, other medicines, foods, dyes, or preservatives -pregnant or trying to get pregnant -breast-feeding How should I use this medicine? This drug is usually given as an infusion into a vein. It is administered in a hospital or clinic by a specially trained health care professional. Talk to your pediatrician regarding the use of this medicine in children. Special care may be needed. Overdosage: If you think you have taken too much of this medicine contact a poison control center or emergency room at once. NOTE: This medicine is only for you. Do not share this medicine with others. What if I miss a dose? It is important not to miss a dose. Call your doctor or health care professional if you are unable to keep an appointment. What may interact with this medicine? -medicines for seizures -medicines to increase blood counts like filgrastim, pegfilgrastim, sargramostim -some antibiotics like amikacin, gentamicin, neomycin, streptomycin, tobramycin -vaccines Talk to your doctor or health care professional before taking any of these medicines: -acetaminophen -aspirin -ibuprofen -ketoprofen -naproxen This list may not describe all possible interactions. Give your health care provider a list of all the medicines, herbs,  non-prescription drugs, or dietary supplements you use. Also tell them if you smoke, drink alcohol, or use illegal drugs. Some items may interact with your medicine. What should I watch for while using this medicine? Your condition will be monitored carefully while you are receiving this medicine. You will need important blood work done while you are taking this medicine. This drug may make you feel generally unwell. This is not uncommon, as chemotherapy can affect healthy cells as well as cancer cells. Report any side effects. Continue your course of treatment even though you feel ill unless your doctor tells you to stop. In some cases, you may be given additional medicines to help with side effects. Follow all directions for their use. Call your doctor or health care professional for advice if you get a fever, chills or sore throat, or other symptoms of a cold or flu. Do not treat yourself. This drug decreases your body's ability to fight infections. Try to avoid being around people who are sick. This medicine may increase your risk to bruise or bleed. Call your doctor or health care professional if you notice any unusual bleeding. Be careful brushing and flossing your teeth or using a toothpick because you may get an infection or bleed more easily. If you have any dental work done, tell your dentist you are receiving this medicine. Avoid taking products that contain aspirin, acetaminophen, ibuprofen, naproxen, or ketoprofen unless instructed by your doctor. These medicines may hide a fever. Do not become pregnant while taking this medicine.  Women should inform their doctor if they wish to become pregnant or think they might be pregnant. There is a potential for serious side effects to an unborn child. Talk to your health care professional or pharmacist for more information. Do not breast-feed an infant while taking this medicine. What side effects may I notice from receiving this medicine? Side effects  that you should report to your doctor or health care professional as soon as possible: -allergic reactions like skin rash, itching or hives, swelling of the face, lips, or tongue -signs of infection - fever or chills, cough, sore throat, pain or difficulty passing urine -signs of decreased platelets or bleeding - bruising, pinpoint red spots on the skin, black, tarry stools, nosebleeds -signs of decreased red blood cells - unusually weak or tired, fainting spells, lightheadedness -breathing problems -changes in hearing -changes in vision -chest pain -high blood pressure -low blood counts - This drug may decrease the number of white blood cells, red blood cells and platelets. You may be at increased risk for infections and bleeding. -nausea and vomiting -pain, swelling, redness or irritation at the injection site -pain, tingling, numbness in the hands or feet -problems with balance, talking, walking -trouble passing urine or change in the amount of urine Side effects that usually do not require medical attention (report to your doctor or health care professional if they continue or are bothersome): -hair loss -loss of appetite -metallic taste in the mouth or changes in taste This list may not describe all possible side effects. Call your doctor for medical advice about side effects. You may report side effects to FDA at 1-800-FDA-1088. Where should I keep my medicine? This drug is given in a hospital or clinic and will not be stored at home. NOTE: This sheet is a summary. It may not cover all possible information. If you have questions about this medicine, talk to your doctor, pharmacist, or health care provider.    2016, [employer]. (2007-04-22 14:38:05)

## 2015-02-16 NOTE — Patient Instructions (Signed)

## 2015-02-23 ENCOUNTER — Telehealth: Payer: Self-pay | Admitting: *Deleted

## 2015-02-23 ENCOUNTER — Other Ambulatory Visit (HOSPITAL_BASED_OUTPATIENT_CLINIC_OR_DEPARTMENT_OTHER): Payer: BLUE CROSS/BLUE SHIELD

## 2015-02-23 ENCOUNTER — Ambulatory Visit: Payer: BLUE CROSS/BLUE SHIELD

## 2015-02-23 ENCOUNTER — Encounter: Payer: Self-pay | Admitting: Hematology and Oncology

## 2015-02-23 ENCOUNTER — Ambulatory Visit (HOSPITAL_BASED_OUTPATIENT_CLINIC_OR_DEPARTMENT_OTHER): Payer: BLUE CROSS/BLUE SHIELD | Admitting: Hematology and Oncology

## 2015-02-23 ENCOUNTER — Ambulatory Visit (HOSPITAL_BASED_OUTPATIENT_CLINIC_OR_DEPARTMENT_OTHER): Payer: BLUE CROSS/BLUE SHIELD

## 2015-02-23 ENCOUNTER — Telehealth: Payer: Self-pay | Admitting: Hematology and Oncology

## 2015-02-23 VITALS — BP 144/89 | HR 88 | Temp 98.1°F | Resp 20 | Wt 242.1 lb

## 2015-02-23 DIAGNOSIS — D696 Thrombocytopenia, unspecified: Secondary | ICD-10-CM

## 2015-02-23 DIAGNOSIS — G622 Polyneuropathy due to other toxic agents: Secondary | ICD-10-CM | POA: Diagnosis not present

## 2015-02-23 DIAGNOSIS — Z5112 Encounter for antineoplastic immunotherapy: Secondary | ICD-10-CM | POA: Diagnosis not present

## 2015-02-23 DIAGNOSIS — R600 Localized edema: Secondary | ICD-10-CM

## 2015-02-23 DIAGNOSIS — T451X5A Adverse effect of antineoplastic and immunosuppressive drugs, initial encounter: Secondary | ICD-10-CM

## 2015-02-23 DIAGNOSIS — C3411 Malignant neoplasm of upper lobe, right bronchus or lung: Secondary | ICD-10-CM

## 2015-02-23 DIAGNOSIS — Z5111 Encounter for antineoplastic chemotherapy: Secondary | ICD-10-CM

## 2015-02-23 DIAGNOSIS — Z95828 Presence of other vascular implants and grafts: Secondary | ICD-10-CM

## 2015-02-23 DIAGNOSIS — I10 Essential (primary) hypertension: Secondary | ICD-10-CM

## 2015-02-23 DIAGNOSIS — L089 Local infection of the skin and subcutaneous tissue, unspecified: Secondary | ICD-10-CM

## 2015-02-23 DIAGNOSIS — G620 Drug-induced polyneuropathy: Secondary | ICD-10-CM

## 2015-02-23 DIAGNOSIS — L0889 Other specified local infections of the skin and subcutaneous tissue: Secondary | ICD-10-CM

## 2015-02-23 LAB — CBC WITH DIFFERENTIAL/PLATELET
BASO%: 0.3 % (ref 0.0–2.0)
BASOS ABS: 0 10*3/uL (ref 0.0–0.1)
EOS%: 2 % (ref 0.0–7.0)
Eosinophils Absolute: 0.1 10*3/uL (ref 0.0–0.5)
HEMATOCRIT: 36 % (ref 34.8–46.6)
HGB: 11.6 g/dL (ref 11.6–15.9)
LYMPH#: 1 10*3/uL (ref 0.9–3.3)
LYMPH%: 16.1 % (ref 14.0–49.7)
MCH: 33.7 pg (ref 25.1–34.0)
MCHC: 32.2 g/dL (ref 31.5–36.0)
MCV: 104.7 fL — ABNORMAL HIGH (ref 79.5–101.0)
MONO#: 0.6 10*3/uL (ref 0.1–0.9)
MONO%: 9 % (ref 0.0–14.0)
NEUT#: 4.4 10*3/uL (ref 1.5–6.5)
NEUT%: 72.6 % (ref 38.4–76.8)
PLATELETS: 139 10*3/uL — AB (ref 145–400)
RBC: 3.44 10*6/uL — AB (ref 3.70–5.45)
RDW: 15.6 % — ABNORMAL HIGH (ref 11.2–14.5)
WBC: 6.1 10*3/uL (ref 3.9–10.3)

## 2015-02-23 LAB — COMPREHENSIVE METABOLIC PANEL
ALT: 14 U/L (ref 0–55)
ANION GAP: 9 meq/L (ref 3–11)
AST: 19 U/L (ref 5–34)
Albumin: 3.7 g/dL (ref 3.5–5.0)
Alkaline Phosphatase: 80 U/L (ref 40–150)
BUN: 16.1 mg/dL (ref 7.0–26.0)
CALCIUM: 9.6 mg/dL (ref 8.4–10.4)
CHLORIDE: 106 meq/L (ref 98–109)
CO2: 24 meq/L (ref 22–29)
Creatinine: 1 mg/dL (ref 0.6–1.1)
EGFR: 59 mL/min/{1.73_m2} — AB (ref >90)
Glucose: 135 mg/dl (ref 70–140)
POTASSIUM: 4.1 meq/L (ref 3.5–5.1)
Sodium: 140 mEq/L (ref 136–145)
Total Bilirubin: 0.36 mg/dL (ref 0.20–1.20)
Total Protein: 7.8 g/dL (ref 6.4–8.3)

## 2015-02-23 MED ORDER — SODIUM CHLORIDE 0.9 % IJ SOLN
10.0000 mL | INTRAMUSCULAR | Status: DC | PRN
  Administered 2015-02-23: 10 mL
  Filled 2015-02-23: qty 10

## 2015-02-23 MED ORDER — CARBOPLATIN CHEMO INTRADERMAL TEST DOSE 100MCG/0.02ML
100.0000 ug | Freq: Once | INTRADERMAL | Status: AC
  Administered 2015-02-23: 100 ug via INTRADERMAL
  Filled 2015-02-23: qty 0.01

## 2015-02-23 MED ORDER — SODIUM CHLORIDE 0.9% FLUSH
10.0000 mL | INTRAVENOUS | Status: DC | PRN
  Administered 2015-02-23: 10 mL via INTRAVENOUS
  Filled 2015-02-23: qty 10

## 2015-02-23 MED ORDER — FAMOTIDINE IN NACL 20-0.9 MG/50ML-% IV SOLN
INTRAVENOUS | Status: AC
  Filled 2015-02-23: qty 50

## 2015-02-23 MED ORDER — SODIUM CHLORIDE 0.9 % IV SOLN: Freq: Once | INTRAVENOUS | Status: DC

## 2015-02-23 MED ORDER — DIPHENHYDRAMINE HCL 50 MG/ML IJ SOLN
INTRAMUSCULAR | Status: AC
  Filled 2015-02-23: qty 1

## 2015-02-23 MED ORDER — FAMOTIDINE IN NACL 20-0.9 MG/50ML-% IV SOLN
20.0000 mg | Freq: Once | INTRAVENOUS | Status: AC
  Administered 2015-02-23: 20 mg via INTRAVENOUS

## 2015-02-23 MED ORDER — SODIUM CHLORIDE 0.9 % IV SOLN
Freq: Once | INTRAVENOUS | Status: AC
  Administered 2015-02-23: 14:00:00 via INTRAVENOUS
  Filled 2015-02-23: qty 8

## 2015-02-23 MED ORDER — DIPHENHYDRAMINE HCL 50 MG/ML IJ SOLN
50.0000 mg | Freq: Once | INTRAMUSCULAR | Status: AC
  Administered 2015-02-23: 50 mg via INTRAVENOUS

## 2015-02-23 MED ORDER — HEPARIN SOD (PORK) LOCK FLUSH 100 UNIT/ML IV SOLN
500.0000 [IU] | Freq: Once | INTRAVENOUS | Status: AC | PRN
  Administered 2015-02-23: 500 [IU]
  Filled 2015-02-23: qty 5

## 2015-02-23 MED ORDER — SODIUM CHLORIDE 0.9 % IV SOLN
Freq: Once | INTRAVENOUS | Status: AC
  Administered 2015-02-23: 12:00:00 via INTRAVENOUS

## 2015-02-23 MED ORDER — CARBOPLATIN CHEMO INJECTION 450 MG/45ML
165.9200 mg | Freq: Once | INTRAVENOUS | Status: AC
  Administered 2015-02-23: 170 mg via INTRAVENOUS
  Filled 2015-02-23: qty 17

## 2015-02-23 MED ORDER — PACLITAXEL CHEMO INJECTION 300 MG/50ML
36.0000 mg/m2 | Freq: Once | INTRAVENOUS | Status: AC
  Administered 2015-02-23: 84 mg via INTRAVENOUS
  Filled 2015-02-23: qty 14

## 2015-02-23 MED ORDER — SODIUM CHLORIDE 0.9 % IV SOLN
15.0000 mg/kg | Freq: Once | INTRAVENOUS | Status: AC
  Administered 2015-02-23: 1500 mg via INTRAVENOUS
  Filled 2015-02-23: qty 60

## 2015-02-23 NOTE — Patient Instructions (Signed)

## 2015-02-23 NOTE — Telephone Encounter (Signed)
Pt confirmed labs/ov per 01/25 POF, gave pt AVS and Calendar..... KJ, sent msg to add chemo

## 2015-02-23 NOTE — Telephone Encounter (Signed)
Per staff message and POF I have scheduled appts. Advised scheduler of appts. JMW  

## 2015-02-23 NOTE — Patient Instructions (Signed)
[facility] Discharge Instructions for Patients Receiving Chemotherapy  Today you received the following chemotherapy agents: Avastin, Taxol and Carboplatin  To help prevent nausea and vomiting after your treatment, we encourage you to take your nausea medication: Zofran 8 mg every 6 hours as needed.   If you develop nausea and vomiting that is not controlled by your nausea medication, call the clinic.   BELOW ARE SYMPTOMS THAT SHOULD BE REPORTED IMMEDIATELY:  *FEVER GREATER THAN 100.5 F  *CHILLS WITH OR WITHOUT FEVER  NAUSEA AND VOMITING THAT IS NOT CONTROLLED WITH YOUR NAUSEA MEDICATION  *UNUSUAL SHORTNESS OF BREATH  *UNUSUAL BRUISING OR BLEEDING  TENDERNESS IN MOUTH AND THROAT WITH OR WITHOUT PRESENCE OF ULCERS  *URINARY PROBLEMS  *BOWEL PROBLEMS  UNUSUAL RASH Items with * indicate a potential emergency and should be followed up as soon as possible.  Feel free to call the clinic you have any questions or concerns. The clinic phone number is [phone].  Please show the [facility] at check-in to the Emergency Department and triage nurse.

## 2015-02-24 DIAGNOSIS — D696 Thrombocytopenia, unspecified: Secondary | ICD-10-CM | POA: Insufficient documentation

## 2015-02-24 DIAGNOSIS — L089 Local infection of the skin and subcutaneous tissue, unspecified: Secondary | ICD-10-CM | POA: Insufficient documentation

## 2015-02-24 NOTE — Assessment & Plan Note (Signed)
This is likely due to recent treatment.  Her CBC is much improved with reduced dose above. The patient denies recent history of bleeding such as epistaxis, hematuria or hematochezia. She is asymptomatic from the low platelet count. I will observe for now.  she does not require transfusion now. I will continue the chemotherapy at current dose without further dosage adjustment.  If the thrombocytopenia gets progressive worse in the future, I might have to delay her treatment or adjust the chemotherapy dose.

## 2015-02-24 NOTE — Assessment & Plan Note (Signed)
Since I reduced the dose of carboplatin and Taxol, neuropathy stable. Continue same dose adjustment. I will reassess next month

## 2015-02-24 NOTE — Progress Notes (Signed)
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   12/13/2013 Imaging CT scan show large mass in the right lung apex likely representing primary lung carcinoma or large metastasis. Metastases demonstrated in the pretracheal lymph nodes, T1 vertebral body and possibly sacrum.   12/14/2013 Initial Diagnosis Adenocarcinoma of upper lobe of right lung   12/14/2013 Pathology Results Accession: [id number] biopsy from right upper lobe showed adenocarcinoma. [facility] testing was positive for several mutations without any known treatment options now   12/17/2013 Imaging MRI of the head is negative.   12/23/2013 - 02/05/2014 Radiation Therapy She received radiation therapy to the mediastinal mass and bone   12/28/2013 Imaging PET/CT scan showed mediastinal mass, lymphadenopathy and T1 involvement.   01/01/2014 - 01/29/2014 Chemotherapy Weekly carboplatin and Taxol were added   03/12/2014 Imaging Repeat PET scan showed near complete response to treatment.   03/16/2014 - 05/28/2014 Chemotherapy She started treatment with Alimta for maintenance   07/07/2014 Imaging PET scan showed disease progression   07/15/2014 - 09/23/2014 Chemotherapy She received palliative treatment with Opdivo   08/13/2014 - 08/25/2014 Radiation Therapy She completed palliative radiation therapy to the bone.   10/05/2014 Imaging PET CT showed disease progression except for site of  recent radiation therapy   10/13/2014 -  Chemotherapy She received Rx with weekly carbo/taxol, 2 weeks on 1 week off with Avastin every 3 weeks   12/15/2014 Imaging PET scan showed positive response to Rx   01/18/2015 Imaging Ct chest showed disease improvement    INTERVAL HISTORY: Please see below for problem oriented charting. She returns for further follow-up. She tolerated reduced dose of chemotherapy well. She have persistent peripheral neuropathy but they are not worse. Recently, she had skin infection under the right armpit which is improving. Denies fevers or chills. . She complained of bilateral lower extremity edema, left worse than the right.  REVIEW OF SYSTEMS:   Constitutional: Denies fevers, chills or abnormal weight loss Eyes: Denies blurriness of vision Ears, nose, mouth, throat, and face: Denies mucositis or sore throat Respiratory: Denies cough, dyspnea or wheezes Cardiovascular: Denies palpitation, chest discomfort  Gastrointestinal:  Denies nausea, heartburn or change in bowel habits Lymphatics: Denies new lymphadenopathy or easy bruising Neurological:Denies numbness, tingling or new weaknesses Behavioral/Psych: Mood is stable, no new changes  All other systems were reviewed with the patient and are negative.  I have reviewed the past medical history, past surgical history, social history and family history with the patient and they are unchanged from previous note.  ALLERGIES:  is allergic to codeine; penicillins; and sulfa antibiotics.  MEDICATIONS:  Current Outpatient Prescriptions  Medication Sig Dispense Refill  . albuterol (PROVENTIL HFA;VENTOLIN HFA) 108 (90 BASE) MCG/ACT inhaler Inhale 2 puffs into the lungs every 4 (four) hours as needed for wheezing or shortness of breath. 1 Inhaler 5  . amLODipine (NORVASC) 10 MG tablet Take 1 tablet (10  mg total) by mouth daily. 90 tablet 0  . fluticasone (FLONASE) 50 MCG/ACT nasal spray Place 2 sprays into both  nostrils daily. 16 g 5  . Fluticasone Furoate-Vilanterol (BREO ELLIPTA) 200-25 MCG/INH AEPB Inhale 1 puff into the lungs daily.     Marland Kitchen levothyroxine (SYNTHROID, LEVOTHROID) 200 MCG tablet TAKE 1 TABLET BY MOUTH DAILY BEFORE BREAKFAST. 90 tablet 0  . lidocaine-prilocaine (EMLA) cream Apply 1 application topically as needed. 90 g 0  . LORazepam (ATIVAN) 0.5 MG tablet TAKE 1 TABLET BY MOUTH EVERY 8 HOURS AS NEEDED FOR ANXIETY OR FOR NAUSEA 90 tablet 0  . magic mouthwash w/lidocaine SOLN Take 5 mLs by mouth 4 (four) times daily. Dukes Formula with Nystatin and Lidocaine 1:1 300 mL 0  . mirtazapine (REMERON) 15 MG tablet TAKE 1 TABLET (15 MG TOTAL) BY MOUTH AT BEDTIME AS NEEDED (FOR SLEEP.). 90 tablet 0  . ondansetron (ZOFRAN) 8 MG tablet TAKE 1 TABLET BY MOUTH EVERY 6 HOURS AS NEEDED FOR NAUSEA. 90 tablet 0  . ranitidine (ZANTAC) 150 MG tablet Take 1 tablet (150 mg total) by mouth every evening. 90 tablet 2  . sucralfate (CARAFATE) 1 G tablet Take 1 g by mouth 4 (four) times daily. Reported on 01/12/2015  0   No current facility-administered medications for this visit.   Facility-Administered Medications Ordered in Other Visits  Medication Dose Route Frequency Provider Last Rate Last Dose  . LORazepam (ATIVAN) injection 0.5 mg  0.5 mg Intravenous Once [name], MD   0.5 mg at 01/05/15 1003  . sodium chloride 0.9 % injection 10 mL  10 mL Intracatheter PRN [name], MD   10 mL at 01/05/15 1252    PHYSICAL EXAMINATION: ECOG PERFORMANCE STATUS: 1 - Symptomatic but completely ambulatory  Filed Vitals:   02/23/15 1115  BP: 144/89  Pulse: 88  Temp: 98.1 F (36.7 C)  Resp: 20   Filed Weights   02/23/15 1115  Weight: 242 lb 1.6 oz (109.816 kg)    GENERAL:alert, no distress and comfortable. She is morbidly obese SKIN: skin color, texture, turgor are normal, no rashes or significant lesions EYES: normal, Conjunctiva are pink and non-injected, sclera clear OROPHARYNX:no exudate, no erythema  and lips, buccal mucosa, and tongue normal  NECK: supple, thyroid normal size, non-tender, without nodularity LYMPH:  no palpable lymphadenopathy in the cervical, axillary or inguinal LUNGS: clear to auscultation and percussion with normal breathing effort HEART: regular rate & rhythm and no murmurs with mild bilateral lower extremity edema. Nothing to suspect DVT ABDOMEN:abdomen soft, non-tender and normal bowel sounds Musculoskeletal:no cyanosis of digits and no clubbing  NEURO: alert & oriented x 3 with fluent speech, no focal motor/sensory deficits  LABORATORY DATA:  I have reviewed the data as listed    Component Value Date/Time   NA 140 02/23/2015 1038   NA 138 06/24/2014 1422   K 4.1 02/23/2015 1038   K 4.0 06/24/2014 1422   CL 96* 06/24/2014 1422   CO2 24 02/23/2015 1038   CO2 30 06/24/2014 1422   GLUCOSE 135 02/23/2015 1038   GLUCOSE 151* 06/24/2014 1422   BUN 16.1 02/23/2015 1038   BUN 13 06/24/2014 1422   CREATININE 1.0 02/23/2015 1038   CREATININE 1.54* 06/24/2014 1422   CALCIUM 9.6 02/23/2015 1038   CALCIUM 9.1 06/24/2014 1422   PROT 7.8 02/23/2015 1038   PROT 7.5 12/13/2013 2018   ALBUMIN 3.7 02/23/2015 1038   ALBUMIN 3.7 12/13/2013 2018   AST 19 02/23/2015 1038  AST 17 12/13/2013 2018   ALT 14 02/23/2015 1038   ALT 14 12/13/2013 2018   ALKPHOS 80 02/23/2015 1038   ALKPHOS 80 12/13/2013 2018   BILITOT 0.36 02/23/2015 1038   BILITOT 0.3 12/13/2013 2018   GFRNONAA 36* 06/24/2014 1422   GFRAA 41* 06/24/2014 1422    No results found for: SPEP, UPEP  Lab Results  Component Value Date   WBC 6.1 02/23/2015   NEUTROABS 4.4 02/23/2015   HGB 11.6 02/23/2015   HCT 36.0 02/23/2015   MCV 104.7* 02/23/2015   PLT 139* 02/23/2015      Chemistry      Component Value Date/Time   NA 140 02/23/2015 1038   NA 138 06/24/2014 1422   K 4.1 02/23/2015 1038   K 4.0 06/24/2014 1422   CL 96* 06/24/2014 1422   CO2 24 02/23/2015 1038   CO2 30 06/24/2014 1422   BUN  16.1 02/23/2015 1038   BUN 13 06/24/2014 1422   CREATININE 1.0 02/23/2015 1038   CREATININE 1.54* 06/24/2014 1422      Component Value Date/Time   CALCIUM 9.6 02/23/2015 1038   CALCIUM 9.1 06/24/2014 1422   ALKPHOS 80 02/23/2015 1038   ALKPHOS 80 12/13/2013 2018   AST 19 02/23/2015 1038   AST 17 12/13/2013 2018   ALT 14 02/23/2015 1038   ALT 14 12/13/2013 2018   BILITOT 0.36 02/23/2015 1038   BILITOT 0.3 12/13/2013 2018       ASSESSMENT & PLAN:  Adenocarcinoma of upper lobe of right lung  Recent CT scan showed positive response to treatment. However, she is getting mild worsening neuropathy. She is also concerned about deconditioning and weight gain. I will continue reduced dose treatment with dose of steroids down to minimum 2 mg before treatment, reduced carboplatin and Taxol by 20% due to worsening neuropathy. I plan to continue treatment 2 weeks on 1 week off and to repeat CT scan again in 3 months, next due at the end of March   Thrombocytopenia [facility]) This is likely due to recent treatment.  Her CBC is much improved with reduced dose above. The patient denies recent history of bleeding such as epistaxis, hematuria or hematochezia. She is asymptomatic from the low platelet count. I will observe for now.  she does not require transfusion now. I will continue the chemotherapy at current dose without further dosage adjustment.  If the thrombocytopenia gets progressive worse in the future, I might have to delay her treatment or adjust the chemotherapy dose.    Superficial skin infection She has superficial skin infection over the right armpit which is improving. I placed a Band-Aid on it and recommend conservative management only at this point. No antibiotics are required.  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 Continue same dose adjustment. I will reassess next month    Bilateral leg  edema She has bilateral leg edema on the background history of hypertension. I recommend Lasix as tolerated and will continue to modify her blood pressure medications   No orders of the defined types were placed in this encounter.   All questions were answered. The patient knows to call the clinic with any problems, questions or concerns. No barriers to learning was detected. I spent 30 minutes counseling the patient face to face. The total time spent in the appointment was 40 minutes and more than 50% was on counseling and review of test results  [facility], [place], MD 02/24/2015 8:23 AM

## 2015-02-24 NOTE — Assessment & Plan Note (Signed)
She has bilateral leg edema on the background history of hypertension. I recommend Lasix as tolerated and will continue to modify her blood pressure medications

## 2015-02-24 NOTE — Assessment & Plan Note (Signed)
She has superficial skin infection over the right armpit which is improving. I placed a Band-Aid on it and recommend conservative management only at this point. No antibiotics are required.

## 2015-02-24 NOTE — Assessment & Plan Note (Signed)
Recent CT scan showed positive response to treatment. However, she is getting mild worsening neuropathy. She is also concerned about deconditioning and weight gain. I will continue reduced dose treatment with dose of steroids down to minimum 2 mg before treatment, reduced carboplatin and Taxol by 20% due to worsening neuropathy. I plan to continue treatment 2 weeks on 1 week off and to repeat CT scan again in 3 months, next due at the end of March

## 2015-03-08 ENCOUNTER — Telehealth: Payer: Self-pay | Admitting: *Deleted

## 2015-03-08 NOTE — Telephone Encounter (Signed)
Called patient with Dr. Alvy Bimler response.  Advised she call early am if needs to be worked in with Dr. Alvy Bimler or [facility]  No further questions.

## 2015-03-08 NOTE — Telephone Encounter (Signed)
1) I recommend cancelling her rx tomorrow to allow her body to heal. This should not affect her Rx. She would just miss one Rx 2) Most of her symptoms are suggestive of viral causes. I recommend Tylenol, oral fluids, OTC cough remedies and bed rest. If not better call early tomorrow morning, I might be able to work her in for eval

## 2015-03-08 NOTE — Telephone Encounter (Addendum)
"  I do not feel well and need to cancel tomorrow's appointment.  I feel like a car ran over me.  May have the flu.  My temperature = 99.4 to 100.1. Felt bad then yesterday at 5:00 pm started with chills.  Went to bed and have been there ever since.  I cough and have clear phlegm with a little yellow and dry heaves.  No chills today.  Taking ibuprofen, robitussin DM.   What does this mean for my treatment schedule?"    Advised she is currently scheduled for next week 03-16-2015 so this may be added to end of treatments but this nurse will notify Dr. Alvy Bimler.  Return number [phone].

## 2015-03-09 ENCOUNTER — Other Ambulatory Visit: Payer: BLUE CROSS/BLUE SHIELD

## 2015-03-09 ENCOUNTER — Ambulatory Visit: Payer: BLUE CROSS/BLUE SHIELD

## 2015-03-09 NOTE — Telephone Encounter (Signed)
Pt was asking if she should change her cough syrup because he cough was now dry. She is taking guafenesin with dextromethorphan. Explained it will suppress her cough but when she does cough it will help it be productive. Encouraged lots of fluids. Reiterated Dr Calton Dach response yesterday. Pt thanked me for call back.

## 2015-03-10 ENCOUNTER — Encounter: Payer: Self-pay | Admitting: *Deleted

## 2015-03-16 ENCOUNTER — Ambulatory Visit (HOSPITAL_BASED_OUTPATIENT_CLINIC_OR_DEPARTMENT_OTHER): Payer: BLUE CROSS/BLUE SHIELD

## 2015-03-16 ENCOUNTER — Ambulatory Visit: Payer: BLUE CROSS/BLUE SHIELD

## 2015-03-16 ENCOUNTER — Other Ambulatory Visit (HOSPITAL_BASED_OUTPATIENT_CLINIC_OR_DEPARTMENT_OTHER): Payer: BLUE CROSS/BLUE SHIELD

## 2015-03-16 VITALS — BP 134/67 | HR 60 | Temp 98.2°F | Resp 18 | Ht 74.0 in

## 2015-03-16 DIAGNOSIS — C3411 Malignant neoplasm of upper lobe, right bronchus or lung: Secondary | ICD-10-CM | POA: Diagnosis not present

## 2015-03-16 DIAGNOSIS — Z5111 Encounter for antineoplastic chemotherapy: Secondary | ICD-10-CM

## 2015-03-16 DIAGNOSIS — Z5112 Encounter for antineoplastic immunotherapy: Secondary | ICD-10-CM

## 2015-03-16 DIAGNOSIS — Z95828 Presence of other vascular implants and grafts: Secondary | ICD-10-CM

## 2015-03-16 LAB — CBC WITH DIFFERENTIAL/PLATELET
BASO%: 0.7 % (ref 0.0–2.0)
Basophils Absolute: 0 10*3/uL (ref 0.0–0.1)
EOS%: 1.3 % (ref 0.0–7.0)
Eosinophils Absolute: 0.1 10*3/uL (ref 0.0–0.5)
HEMATOCRIT: 37.8 % (ref 34.8–46.6)
HEMOGLOBIN: 12.3 g/dL (ref 11.6–15.9)
LYMPH#: 0.9 10*3/uL (ref 0.9–3.3)
LYMPH%: 16.5 % (ref 14.0–49.7)
MCH: 33.3 pg (ref 25.1–34.0)
MCHC: 32.7 g/dL (ref 31.5–36.0)
MCV: 101.9 fL — ABNORMAL HIGH (ref 79.5–101.0)
MONO#: 0.5 10*3/uL (ref 0.1–0.9)
MONO%: 9.8 % (ref 0.0–14.0)
NEUT#: 3.8 10*3/uL (ref 1.5–6.5)
NEUT%: 71.7 % (ref 38.4–76.8)
PLATELETS: 183 10*3/uL (ref 145–400)
RBC: 3.71 10*6/uL (ref 3.70–5.45)
RDW: 16.3 % — AB (ref 11.2–14.5)
WBC: 5.3 10*3/uL (ref 3.9–10.3)

## 2015-03-16 LAB — COMPREHENSIVE METABOLIC PANEL
ALBUMIN: 3.5 g/dL (ref 3.5–5.0)
ALK PHOS: 86 U/L (ref 40–150)
ALT: 11 U/L (ref 0–55)
ANION GAP: 11 meq/L (ref 3–11)
AST: 16 U/L (ref 5–34)
BILIRUBIN TOTAL: 0.34 mg/dL (ref 0.20–1.20)
BUN: 18 mg/dL (ref 7.0–26.0)
CALCIUM: 9.4 mg/dL (ref 8.4–10.4)
CHLORIDE: 105 meq/L (ref 98–109)
CO2: 25 mEq/L (ref 22–29)
CREATININE: 1.1 mg/dL (ref 0.6–1.1)
EGFR: 52 mL/min/{1.73_m2} — ABNORMAL LOW (ref >90)
Glucose: 120 mg/dl (ref 70–140)
Potassium: 3.8 mEq/L (ref 3.5–5.1)
Sodium: 141 mEq/L (ref 136–145)
TOTAL PROTEIN: 8 g/dL (ref 6.4–8.3)

## 2015-03-16 MED ORDER — SODIUM CHLORIDE 0.9 % IV SOLN
15.0000 mg/kg | Freq: Once | INTRAVENOUS | Status: AC
  Administered 2015-03-16: 1500 mg via INTRAVENOUS
  Filled 2015-03-16: qty 48

## 2015-03-16 MED ORDER — HEPARIN SOD (PORK) LOCK FLUSH 100 UNIT/ML IV SOLN
500.0000 [IU] | Freq: Once | INTRAVENOUS | Status: AC | PRN
  Administered 2015-03-16: 500 [IU]
  Filled 2015-03-16: qty 5

## 2015-03-16 MED ORDER — SODIUM CHLORIDE 0.9 % IV SOLN
170.0000 mg | Freq: Once | INTRAVENOUS | Status: AC
  Administered 2015-03-16: 170 mg via INTRAVENOUS
  Filled 2015-03-16: qty 17

## 2015-03-16 MED ORDER — SODIUM CHLORIDE 0.9% FLUSH
10.0000 mL | INTRAVENOUS | Status: DC | PRN
  Administered 2015-03-16: 10 mL via INTRAVENOUS
  Filled 2015-03-16: qty 10

## 2015-03-16 MED ORDER — CARBOPLATIN CHEMO INTRADERMAL TEST DOSE 100MCG/0.02ML
100.0000 ug | Freq: Once | INTRADERMAL | Status: AC
  Administered 2015-03-16: 100 ug via INTRADERMAL
  Filled 2015-03-16: qty 0.01

## 2015-03-16 MED ORDER — FAMOTIDINE IN NACL 20-0.9 MG/50ML-% IV SOLN
INTRAVENOUS | Status: AC
  Filled 2015-03-16: qty 50

## 2015-03-16 MED ORDER — FAMOTIDINE IN NACL 20-0.9 MG/50ML-% IV SOLN
20.0000 mg | Freq: Once | INTRAVENOUS | Status: AC
  Administered 2015-03-16: 20 mg via INTRAVENOUS

## 2015-03-16 MED ORDER — SODIUM CHLORIDE 0.9 % IV SOLN
Freq: Once | INTRAVENOUS | Status: AC
  Administered 2015-03-16: 12:00:00 via INTRAVENOUS
  Filled 2015-03-16: qty 8

## 2015-03-16 MED ORDER — DIPHENHYDRAMINE HCL 50 MG/ML IJ SOLN
50.0000 mg | Freq: Once | INTRAMUSCULAR | Status: AC
  Administered 2015-03-16: 50 mg via INTRAVENOUS

## 2015-03-16 MED ORDER — SODIUM CHLORIDE 0.9 % IJ SOLN
10.0000 mL | INTRAMUSCULAR | Status: DC | PRN
  Administered 2015-03-16: 10 mL
  Filled 2015-03-16: qty 10

## 2015-03-16 MED ORDER — PACLITAXEL CHEMO INJECTION 300 MG/50ML
36.0000 mg/m2 | Freq: Once | INTRAVENOUS | Status: AC
  Administered 2015-03-16: 84 mg via INTRAVENOUS
  Filled 2015-03-16: qty 14

## 2015-03-16 MED ORDER — LORAZEPAM 2 MG/ML IJ SOLN: 0.5000 mg | Freq: Once | INTRAMUSCULAR | Status: DC

## 2015-03-16 MED ORDER — SODIUM CHLORIDE 0.9 % IV SOLN
Freq: Once | INTRAVENOUS | Status: AC
  Administered 2015-03-16: 12:00:00 via INTRAVENOUS

## 2015-03-16 MED ORDER — DIPHENHYDRAMINE HCL 50 MG/ML IJ SOLN
INTRAMUSCULAR | Status: AC
  Filled 2015-03-16: qty 1

## 2015-03-16 NOTE — Patient Instructions (Signed)

## 2015-03-16 NOTE — Patient Instructions (Signed)
[facility] Discharge Instructions for Patients Receiving Chemotherapy  Today you received the following chemotherapy agents:  Carboplatin, Taxol, Avastin  To help prevent nausea and vomiting after your treatment, we encourage you to take your nausea medication as prescribed.   If you develop nausea and vomiting that is not controlled by your nausea medication, call the clinic.   BELOW ARE SYMPTOMS THAT SHOULD BE REPORTED IMMEDIATELY:  *FEVER GREATER THAN 100.5 F  *CHILLS WITH OR WITHOUT FEVER  NAUSEA AND VOMITING THAT IS NOT CONTROLLED WITH YOUR NAUSEA MEDICATION  *UNUSUAL SHORTNESS OF BREATH  *UNUSUAL BRUISING OR BLEEDING  TENDERNESS IN MOUTH AND THROAT WITH OR WITHOUT PRESENCE OF ULCERS  *URINARY PROBLEMS  *BOWEL PROBLEMS  UNUSUAL RASH Items with * indicate a potential emergency and should be followed up as soon as possible.  Feel free to call the clinic you have any questions or concerns. The clinic phone number is [phone].  Please show the [place] at check-in to the Emergency Department and triage nurse.

## 2015-03-18 ENCOUNTER — Telehealth: Payer: Self-pay | Admitting: *Deleted

## 2015-03-18 ENCOUNTER — Other Ambulatory Visit: Payer: BLUE CROSS/BLUE SHIELD

## 2015-03-18 ENCOUNTER — Telehealth: Payer: Self-pay | Admitting: Hematology and Oncology

## 2015-03-18 ENCOUNTER — Other Ambulatory Visit: Payer: Self-pay | Admitting: Hematology and Oncology

## 2015-03-18 DIAGNOSIS — R300 Dysuria: Secondary | ICD-10-CM

## 2015-03-18 MED ORDER — CIPROFLOXACIN HCL 250 MG PO TABS: 250.0000 mg | ORAL_TABLET | Freq: Two times a day (BID) | ORAL | Status: DC

## 2015-03-18 NOTE — Telephone Encounter (Signed)
Pt left VM "I have UTI" and asks for Dr. Alvy Bimler to send Rx for UTI to [facility] in [place] before 3 pm today.  Pt is working until 3 pm at [place].  Dr. Alvy Bimler instructs pt needs to come in to do U/A today and she will give her Rx for Cipro.  We can cancel cipro if u/a comes back negative.   Called pt back and left VM instructing her to come in after work,  Be here before 4 pm for U/A.  Please call us back to confirm.

## 2015-03-18 NOTE — Telephone Encounter (Signed)
per pof to sch pt appt-per notes pt aware

## 2015-03-18 NOTE — Telephone Encounter (Signed)
Called pt and left another VM informing her of order for U/A.  Our lab closes at 4 pm so hopefully pt can get here before then.

## 2015-03-21 ENCOUNTER — Telehealth: Payer: Self-pay | Admitting: *Deleted

## 2015-03-21 ENCOUNTER — Telehealth: Payer: Self-pay | Admitting: Hematology and Oncology

## 2015-03-21 ENCOUNTER — Ambulatory Visit (HOSPITAL_BASED_OUTPATIENT_CLINIC_OR_DEPARTMENT_OTHER): Payer: BLUE CROSS/BLUE SHIELD

## 2015-03-21 ENCOUNTER — Other Ambulatory Visit: Payer: Self-pay | Admitting: Hematology and Oncology

## 2015-03-21 DIAGNOSIS — R300 Dysuria: Secondary | ICD-10-CM

## 2015-03-21 LAB — URINALYSIS, MICROSCOPIC - CHCC
BILIRUBIN (URINE): NEGATIVE
Glucose: NEGATIVE mg/dL
KETONES: NEGATIVE mg/dL
Nitrite: POSITIVE
PH: 6 (ref 4.6–8.0)
Protein: 300 mg/dL
Specific Gravity, Urine: 1.03 (ref 1.003–1.035)
Urobilinogen, UR: 0.2 mg/dL (ref 0.2–1)

## 2015-03-21 NOTE — Telephone Encounter (Signed)
Pt coming today for UA/culture at 3:30pm today

## 2015-03-21 NOTE — Telephone Encounter (Signed)
Made lab only appt per 2/20 pof. Pt aware

## 2015-03-21 NOTE — Telephone Encounter (Signed)
"  I wasn't able to get there by 4:00 on Friday.  Would like to come in today.  Please call [phone]."

## 2015-03-21 NOTE — Telephone Encounter (Signed)
I have orders, please send POF Also, I printed a script for cipro on Friday for her to hang on to, pending urine test

## 2015-03-22 ENCOUNTER — Other Ambulatory Visit: Payer: Self-pay | Admitting: Hematology and Oncology

## 2015-03-23 ENCOUNTER — Telehealth: Payer: Self-pay | Admitting: *Deleted

## 2015-03-23 LAB — URINE CULTURE

## 2015-03-23 NOTE — Telephone Encounter (Signed)
Notified of message below. States she is already feeling better. Will complete cipro

## 2015-03-23 NOTE — Telephone Encounter (Signed)
-----   Message from [name], MD sent at 03/23/2015 11:57 AM EST ----- Regarding: UTI She has UTI It is sensitive to cipro Please tell her to finish the antibiotic prescription ----- Message -----    From: [facility] Interface    Sent: 03/21/2015   2:44 PM      To: [name], MD

## 2015-03-28 ENCOUNTER — Ambulatory Visit: Payer: No Typology Code available for payment source | Admitting: Emergency Medicine

## 2015-03-30 ENCOUNTER — Telehealth: Payer: Self-pay | Admitting: Hematology and Oncology

## 2015-03-30 ENCOUNTER — Other Ambulatory Visit (HOSPITAL_BASED_OUTPATIENT_CLINIC_OR_DEPARTMENT_OTHER): Payer: BLUE CROSS/BLUE SHIELD

## 2015-03-30 ENCOUNTER — Encounter: Payer: Self-pay | Admitting: Hematology and Oncology

## 2015-03-30 ENCOUNTER — Ambulatory Visit (HOSPITAL_BASED_OUTPATIENT_CLINIC_OR_DEPARTMENT_OTHER): Payer: BLUE CROSS/BLUE SHIELD

## 2015-03-30 ENCOUNTER — Ambulatory Visit: Payer: BLUE CROSS/BLUE SHIELD

## 2015-03-30 ENCOUNTER — Ambulatory Visit (HOSPITAL_BASED_OUTPATIENT_CLINIC_OR_DEPARTMENT_OTHER): Payer: BLUE CROSS/BLUE SHIELD | Admitting: Hematology and Oncology

## 2015-03-30 VITALS — BP 139/72 | HR 64 | Temp 98.2°F | Resp 18 | Wt 243.2 lb

## 2015-03-30 DIAGNOSIS — G620 Drug-induced polyneuropathy: Secondary | ICD-10-CM

## 2015-03-30 DIAGNOSIS — C3411 Malignant neoplasm of upper lobe, right bronchus or lung: Secondary | ICD-10-CM | POA: Diagnosis not present

## 2015-03-30 DIAGNOSIS — E038 Other specified hypothyroidism: Secondary | ICD-10-CM

## 2015-03-30 DIAGNOSIS — T451X5A Adverse effect of antineoplastic and immunosuppressive drugs, initial encounter: Secondary | ICD-10-CM

## 2015-03-30 DIAGNOSIS — G622 Polyneuropathy due to other toxic agents: Secondary | ICD-10-CM

## 2015-03-30 DIAGNOSIS — Z95828 Presence of other vascular implants and grafts: Secondary | ICD-10-CM

## 2015-03-30 DIAGNOSIS — R600 Localized edema: Secondary | ICD-10-CM

## 2015-03-30 DIAGNOSIS — Z5111 Encounter for antineoplastic chemotherapy: Secondary | ICD-10-CM | POA: Diagnosis not present

## 2015-03-30 DIAGNOSIS — R609 Edema, unspecified: Secondary | ICD-10-CM

## 2015-03-30 DIAGNOSIS — E039 Hypothyroidism, unspecified: Secondary | ICD-10-CM | POA: Diagnosis not present

## 2015-03-30 DIAGNOSIS — I10 Essential (primary) hypertension: Secondary | ICD-10-CM

## 2015-03-30 LAB — CBC WITH DIFFERENTIAL/PLATELET
BASO%: 0.2 % (ref 0.0–2.0)
BASOS ABS: 0 10*3/uL (ref 0.0–0.1)
EOS ABS: 0.1 10*3/uL (ref 0.0–0.5)
EOS%: 2 % (ref 0.0–7.0)
HEMATOCRIT: 36.8 % (ref 34.8–46.6)
HGB: 11.9 g/dL (ref 11.6–15.9)
LYMPH#: 0.9 10*3/uL (ref 0.9–3.3)
LYMPH%: 15.8 % (ref 14.0–49.7)
MCH: 33.1 pg (ref 25.1–34.0)
MCHC: 32.3 g/dL (ref 31.5–36.0)
MCV: 102.5 fL — ABNORMAL HIGH (ref 79.5–101.0)
MONO#: 0.8 10*3/uL (ref 0.1–0.9)
MONO%: 15.1 % — ABNORMAL HIGH (ref 0.0–14.0)
NEUT#: 3.6 10*3/uL (ref 1.5–6.5)
NEUT%: 66.9 % (ref 38.4–76.8)
PLATELETS: 168 10*3/uL (ref 145–400)
RBC: 3.59 10*6/uL — ABNORMAL LOW (ref 3.70–5.45)
RDW: 14.8 % — ABNORMAL HIGH (ref 11.2–14.5)
WBC: 5.4 10*3/uL (ref 3.9–10.3)

## 2015-03-30 LAB — COMPREHENSIVE METABOLIC PANEL
ALK PHOS: 83 U/L (ref 40–150)
ALT: 10 U/L (ref 0–55)
ANION GAP: 11 meq/L (ref 3–11)
AST: 17 U/L (ref 5–34)
Albumin: 3.4 g/dL — ABNORMAL LOW (ref 3.5–5.0)
BUN: 18.4 mg/dL (ref 7.0–26.0)
CALCIUM: 9.5 mg/dL (ref 8.4–10.4)
CHLORIDE: 105 meq/L (ref 98–109)
CO2: 24 mEq/L (ref 22–29)
CREATININE: 1.2 mg/dL — AB (ref 0.6–1.1)
EGFR: 49 mL/min/{1.73_m2} — ABNORMAL LOW (ref >90)
Glucose: 116 mg/dl (ref 70–140)
POTASSIUM: 4.2 meq/L (ref 3.5–5.1)
Sodium: 141 mEq/L (ref 136–145)
Total Bilirubin: 0.3 mg/dL (ref 0.20–1.20)
Total Protein: 7.8 g/dL (ref 6.4–8.3)

## 2015-03-30 MED ORDER — DIPHENHYDRAMINE HCL 50 MG/ML IJ SOLN
INTRAMUSCULAR | Status: AC
  Filled 2015-03-30: qty 1

## 2015-03-30 MED ORDER — HEPARIN SOD (PORK) LOCK FLUSH 100 UNIT/ML IV SOLN
500.0000 [IU] | Freq: Once | INTRAVENOUS | Status: AC | PRN
  Administered 2015-03-30: 500 [IU]
  Filled 2015-03-30: qty 5

## 2015-03-30 MED ORDER — CARBOPLATIN CHEMO INTRADERMAL TEST DOSE 100MCG/0.02ML
100.0000 ug | Freq: Once | INTRADERMAL | Status: AC
  Administered 2015-03-30: 100 ug via INTRADERMAL
  Filled 2015-03-30: qty 0.01

## 2015-03-30 MED ORDER — FAMOTIDINE IN NACL 20-0.9 MG/50ML-% IV SOLN
20.0000 mg | Freq: Once | INTRAVENOUS | Status: AC
  Administered 2015-03-30: 20 mg via INTRAVENOUS

## 2015-03-30 MED ORDER — DIPHENHYDRAMINE HCL 50 MG/ML IJ SOLN
50.0000 mg | Freq: Once | INTRAMUSCULAR | Status: AC
  Administered 2015-03-30: 50 mg via INTRAVENOUS

## 2015-03-30 MED ORDER — PACLITAXEL CHEMO INJECTION 300 MG/50ML
36.0000 mg/m2 | Freq: Once | INTRAVENOUS | Status: AC
  Administered 2015-03-30: 84 mg via INTRAVENOUS
  Filled 2015-03-30: qty 14

## 2015-03-30 MED ORDER — SODIUM CHLORIDE 0.9 % IJ SOLN
10.0000 mL | INTRAMUSCULAR | Status: DC | PRN
  Administered 2015-03-30: 10 mL
  Filled 2015-03-30: qty 10

## 2015-03-30 MED ORDER — LORAZEPAM 2 MG/ML IJ SOLN: 0.5000 mg | Freq: Once | INTRAMUSCULAR | Status: DC

## 2015-03-30 MED ORDER — FAMOTIDINE IN NACL 20-0.9 MG/50ML-% IV SOLN
INTRAVENOUS | Status: AC
  Filled 2015-03-30: qty 50

## 2015-03-30 MED ORDER — SODIUM CHLORIDE 0.9% FLUSH
10.0000 mL | INTRAVENOUS | Status: DC | PRN
  Administered 2015-03-30: 10 mL via INTRAVENOUS
  Filled 2015-03-30: qty 10

## 2015-03-30 MED ORDER — SODIUM CHLORIDE 0.9 % IV SOLN
170.0000 mg | Freq: Once | INTRAVENOUS | Status: AC
  Administered 2015-03-30: 170 mg via INTRAVENOUS
  Filled 2015-03-30: qty 17

## 2015-03-30 MED ORDER — LEVOTHYROXINE SODIUM 200 MCG PO TABS: ORAL_TABLET | ORAL | Status: DC

## 2015-03-30 MED ORDER — SODIUM CHLORIDE 0.9 % IV SOLN
Freq: Once | INTRAVENOUS | Status: AC
  Administered 2015-03-30: 13:00:00 via INTRAVENOUS
  Filled 2015-03-30: qty 8

## 2015-03-30 MED ORDER — AMLODIPINE BESYLATE 10 MG PO TABS: 10.0000 mg | ORAL_TABLET | Freq: Every day | ORAL | Status: DC

## 2015-03-30 MED ORDER — SODIUM CHLORIDE 0.9 % IV SOLN
Freq: Once | INTRAVENOUS | Status: AC
  Administered 2015-03-30: 12:00:00 via INTRAVENOUS

## 2015-03-30 NOTE — Assessment & Plan Note (Signed)
She has significant hypothyroidism with significant medication adjustment. I will recheck it again next month. I refilled her prescription today

## 2015-03-30 NOTE — Progress Notes (Signed)
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   12/13/2013 Imaging CT scan show large mass in the right lung apex likely representing primary lung carcinoma or large metastasis. Metastases demonstrated in the pretracheal lymph nodes, T1 vertebral body and possibly sacrum.   12/14/2013 Initial Diagnosis Adenocarcinoma of upper lobe of right lung   12/14/2013 Pathology Results Accession: [id number] biopsy from right upper lobe showed adenocarcinoma. [facility] testing was positive for several mutations without any known treatment options now   12/17/2013 Imaging MRI of the head is negative.   12/23/2013 - 02/05/2014 Radiation Therapy She received radiation therapy to the mediastinal mass and bone   12/28/2013 Imaging PET/CT scan showed mediastinal mass, lymphadenopathy and T1 involvement.   01/01/2014 - 01/29/2014 Chemotherapy Weekly carboplatin and Taxol were added   03/12/2014 Imaging Repeat PET scan showed near complete response to treatment.   03/16/2014 - 05/28/2014 Chemotherapy She started treatment with Alimta for maintenance   07/07/2014 Imaging PET scan showed disease progression   07/15/2014 - 09/23/2014 Chemotherapy She received palliative treatment with Opdivo   08/13/2014 - 08/25/2014 Radiation Therapy She completed palliative radiation therapy to the bone.   10/05/2014 Imaging PET CT showed disease progression except for site of  recent radiation therapy   10/13/2014 -  Chemotherapy She received Rx with weekly carbo/taxol, 2 weeks on 1 week off with Avastin every 3 weeks   12/15/2014 Imaging PET scan showed positive response to Rx   01/18/2015 Imaging Ct chest showed disease improvement    INTERVAL HISTORY: Please see below for problem oriented charting. She is seen today before chemotherapy. Recently, she developed urinary tract infection, resolved on ciprofloxacin. She denies further urinary symptoms. She continues to have persistent neuropathy, not worse. She has persistent bilateral lower extremity edema, stable. She denies any cough, chest pain or shortness of breath  REVIEW OF SYSTEMS:   Constitutional: Denies fevers, chills or abnormal weight loss Eyes: Denies blurriness of vision Ears, nose, mouth, throat, and face: Denies mucositis or sore throat Respiratory: Denies cough, dyspnea or wheezes Cardiovascular: Denies palpitation, chest discomfort  Gastrointestinal:  Denies nausea, heartburn or change in bowel habits Skin: Denies abnormal skin rashes Lymphatics: Denies new lymphadenopathy or easy bruising Neurological:Denies numbness, tingling or new weaknesses Behavioral/Psych: Mood is stable, no new changes  All other systems were reviewed with the patient and are negative.  I have reviewed the past medical history, past surgical history, social history and family history with the patient and they are unchanged from previous note.  ALLERGIES:  is allergic to codeine; penicillins; and sulfa antibiotics.  MEDICATIONS:  Current Outpatient Prescriptions  Medication Sig Dispense Refill  . albuterol (PROVENTIL HFA;VENTOLIN HFA) 108 (90 BASE) MCG/ACT inhaler Inhale 2 puffs into the lungs every 4 (four) hours as needed for wheezing or shortness of breath. 1 Inhaler 5  . amLODipine (NORVASC) 10 MG tablet Take 1 tablet (10  mg total) by mouth daily. 90 tablet 0  . fluticasone (FLONASE) 50 MCG/ACT nasal spray  Place 2 sprays into both nostrils daily. 16 g 5  . Fluticasone Furoate-Vilanterol (BREO ELLIPTA) 200-25 MCG/INH AEPB Inhale 1 puff into the lungs daily.     . hydrochlorothiazide (HYDRODIURIL) 25 MG tablet TAKE 1 TABLET BY MOUTH EVERY DAY 30 tablet 1  . levothyroxine (SYNTHROID, LEVOTHROID) 200 MCG tablet TAKE 1 TABLET BY MOUTH DAILY BEFORE BREAKFAST. 90 tablet 0  . lidocaine-prilocaine (EMLA) cream Apply 1 application topically as needed. 90 g 0  . LORazepam (ATIVAN) 0.5 MG tablet TAKE 1 TABLET BY MOUTH EVERY 8 HOURS AS NEEDED FOR ANXIETY OR FOR NAUSEA 90 tablet 0  . magic mouthwash w/lidocaine SOLN Take 5 mLs by mouth 4 (four) times daily. Dukes Formula with Nystatin and Lidocaine 1:1 300 mL 0  . mirtazapine (REMERON) 15 MG tablet TAKE 1 TABLET (15 MG TOTAL) BY MOUTH AT BEDTIME AS NEEDED (FOR SLEEP.). 90 tablet 0  . ondansetron (ZOFRAN) 8 MG tablet TAKE 1 TABLET BY MOUTH EVERY 6 HOURS AS NEEDED FOR NAUSEA. 90 tablet 0  . ranitidine (ZANTAC) 150 MG tablet Take 1 tablet (150 mg total) by mouth every evening. 90 tablet 2  . sucralfate (CARAFATE) 1 G tablet Take 1 g by mouth 4 (four) times daily. Reported on 01/12/2015  0   No current facility-administered medications for this visit.   Facility-Administered Medications Ordered in Other Visits  Medication Dose Route Frequency Provider Last Rate Last Dose  . LORazepam (ATIVAN) injection 0.5 mg  0.5 mg Intravenous Once [name], MD   0.5 mg at 01/05/15 1003  . sodium chloride 0.9 % injection 10 mL  10 mL Intracatheter PRN [name], MD   10 mL at 01/05/15 1252    PHYSICAL EXAMINATION: ECOG PERFORMANCE STATUS: 1 - Symptomatic but completely ambulatory  Filed Vitals:   03/30/15 0945  BP: 139/72  Pulse: 64  Temp: 98.2 F (36.8 C)  Resp: 18   Filed Weights   03/30/15 0945  Weight: 243 lb 3.2 oz (110.315 kg)    GENERAL:alert, no distress and comfortable. She is obese SKIN: skin color, texture, turgor are normal, no rashes or significant  lesions EYES: normal, Conjunctiva are pink and non-injected, sclera clear OROPHARYNX:no exudate, no erythema and lips, buccal mucosa, and tongue normal  NECK: supple, thyroid normal size, non-tender, without nodularity LYMPH:  no palpable lymphadenopathy in the cervical, axillary or inguinal LUNGS: clear to auscultation and percussion with normal breathing effort HEART: regular rate & rhythm and no murmurs with moderate lower extremity edema ABDOMEN:abdomen soft, non-tender and normal bowel sounds Musculoskeletal:no cyanosis of digits and no clubbing  NEURO: alert & oriented x 3 with fluent speech, no focal motor/sensory deficits  LABORATORY DATA:  I have reviewed the data as listed    Component Value Date/Time   NA 141 03/30/2015 0914   NA 138 06/24/2014 1422   K 4.2 03/30/2015 0914   K 4.0 06/24/2014 1422   CL 96* 06/24/2014 1422   CO2 24 03/30/2015 0914   CO2 30 06/24/2014 1422   GLUCOSE 116 03/30/2015 0914   GLUCOSE 151* 06/24/2014 1422   BUN 18.4 03/30/2015 0914   BUN 13 06/24/2014 1422   CREATININE 1.2* 03/30/2015 0914   CREATININE 1.54* 06/24/2014 1422   CALCIUM 9.5 03/30/2015 0914   CALCIUM 9.1 06/24/2014 1422   PROT 7.8 03/30/2015 0914   PROT 7.5 12/13/2013 2018   ALBUMIN 3.4* 03/30/2015 2831  ALBUMIN 3.7 12/13/2013 2018   AST 17 03/30/2015 0914   AST 17 12/13/2013 2018   ALT 10 03/30/2015 0914   ALT 14 12/13/2013 2018   ALKPHOS 83 03/30/2015 0914   ALKPHOS 80 12/13/2013 2018   BILITOT 0.30 03/30/2015 0914   BILITOT 0.3 12/13/2013 2018   GFRNONAA 36* 06/24/2014 1422   GFRAA 41* 06/24/2014 1422    No results found for: SPEP, UPEP  Lab Results  Component Value Date   WBC 5.4 03/30/2015   NEUTROABS 3.6 03/30/2015   HGB 11.9 03/30/2015   HCT 36.8 03/30/2015   MCV 102.5* 03/30/2015   PLT 168 03/30/2015      Chemistry      Component Value Date/Time   NA 141 03/30/2015 0914   NA 138 06/24/2014 1422   K 4.2 03/30/2015 0914   K 4.0 06/24/2014 1422    CL 96* 06/24/2014 1422   CO2 24 03/30/2015 0914   CO2 30 06/24/2014 1422   BUN 18.4 03/30/2015 0914   BUN 13 06/24/2014 1422   CREATININE 1.2* 03/30/2015 0914   CREATININE 1.54* 06/24/2014 1422      Component Value Date/Time   CALCIUM 9.5 03/30/2015 0914   CALCIUM 9.1 06/24/2014 1422   ALKPHOS 83 03/30/2015 0914   ALKPHOS 80 12/13/2013 2018   AST 17 03/30/2015 0914   AST 17 12/13/2013 2018   ALT 10 03/30/2015 0914   ALT 14 12/13/2013 2018   BILITOT 0.30 03/30/2015 0914   BILITOT 0.3 12/13/2013 2018      ASSESSMENT & PLAN:  Adenocarcinoma of upper lobe of right lung Recent CT scan in December 2016 showed positive response to treatment. However, she is getting mild worsening neuropathy. She is also concerned about deconditioning and weight gain. I will continue reduced dose treatment with dose of steroids down to minimum 2 mg before treatment, reduced carboplatin and Taxol by 20% due to worsening neuropathy. I plan to continue treatment 2 weeks on 1 week off and to repeat CT scan again in 3 weeks If CT scan show continue improve disease control, I would discontinue carbo/taxol and go on maintenance Avastin only.  Essential hypertension Her blood pressure fluctuated up and down. Recent urinalysis show mild proteinuria. I will continue to monitor her blood pressure carefully. She will continue medical management  I will proceed with Avastin as scheduled every 3 weeks  Bilateral leg edema She has bilateral leg edema on the background history of hypertension. I recommend Lasix as tolerated and will continue to modify her blood pressure medications  Hypothyroidism She has significant hypothyroidism with significant medication adjustment. I will recheck it again next month. I refilled her prescription today  Neuropathy due to chemotherapeutic drug ([place]) Since I reduced the dose of carboplatin and Taxol, neuropathy stable. Continue same dose adjustment. I will reassess next  month    Orders Placed This Encounter  Procedures  . CT Chest W Contrast    Standing Status: Future     Number of Occurrences:      Standing Expiration Date: 05/29/2016    Order Specific Question:  Reason for Exam (SYMPTOM  OR DIAGNOSIS REQUIRED)    Answer:  lung ca assess response to Rx    Order Specific Question:  Is the patient pregnant?    Answer:  No    Order Specific Question:  Preferred imaging location?    Answer:  [facility]   All questions were answered. The patient knows to call the clinic with any problems,  questions or concerns. No barriers to learning was detected. I spent 25 minutes counseling the patient face to face. The total time spent in the appointment was 30 minutes and more than 50% was on counseling and review of test results     [facility], [place], MD 03/30/2015 10:19 AM

## 2015-03-30 NOTE — Telephone Encounter (Signed)
appt made and avs printed °

## 2015-03-30 NOTE — Assessment & Plan Note (Signed)
Recent CT scan in December 2016 showed positive response to treatment. However, she is getting mild worsening neuropathy. She is also concerned about deconditioning and weight gain. I will continue reduced dose treatment with dose of steroids down to minimum 2 mg before treatment, reduced carboplatin and Taxol by 20% due to worsening neuropathy. I plan to continue treatment 2 weeks on 1 week off and to repeat CT scan again in 3 weeks If CT scan show continue improve disease control, I would discontinue carbo/taxol and go on maintenance Avastin only.

## 2015-03-30 NOTE — Assessment & Plan Note (Signed)
She has bilateral leg edema on the background history of hypertension. I recommend Lasix as tolerated and will continue to modify her blood pressure medications

## 2015-03-30 NOTE — Patient Instructions (Signed)

## 2015-03-30 NOTE — Assessment & Plan Note (Signed)
Since I reduced the dose of carboplatin and Taxol, neuropathy stable. Continue same dose adjustment. I will reassess next month

## 2015-03-30 NOTE — Assessment & Plan Note (Signed)
Her blood pressure fluctuated up and down. Recent urinalysis show mild proteinuria. I will continue to monitor her blood pressure carefully. She will continue medical management  I will proceed with Avastin as scheduled every 3 weeks

## 2015-03-30 NOTE — Patient Instructions (Signed)
Cancer Center Discharge Instructions for Patients Receiving Chemotherapy  Today you received the following chemotherapy agents:  Taxol and Carboplatin  To help prevent nausea and vomiting after your treatment, we encourage you to take your nausea medication as ordered per MD.   If you develop nausea and vomiting that is not controlled by your nausea medication, call the clinic.   BELOW ARE SYMPTOMS THAT SHOULD BE REPORTED IMMEDIATELY:  *FEVER GREATER THAN 100.5 F  *CHILLS WITH OR WITHOUT FEVER  NAUSEA AND VOMITING THAT IS NOT CONTROLLED WITH YOUR NAUSEA MEDICATION  *UNUSUAL SHORTNESS OF BREATH  *UNUSUAL BRUISING OR BLEEDING  TENDERNESS IN MOUTH AND THROAT WITH OR WITHOUT PRESENCE OF ULCERS  *URINARY PROBLEMS  *BOWEL PROBLEMS  UNUSUAL RASH Items with * indicate a potential emergency and should be followed up as soon as possible.  Feel free to call the clinic you have any questions or concerns. The clinic phone number is [phone].  Please show the CHEMO ALERT CARD at check-in to the Emergency Department and triage nurse.   

## 2015-04-06 ENCOUNTER — Ambulatory Visit (HOSPITAL_BASED_OUTPATIENT_CLINIC_OR_DEPARTMENT_OTHER): Payer: BLUE CROSS/BLUE SHIELD

## 2015-04-06 ENCOUNTER — Ambulatory Visit: Payer: BLUE CROSS/BLUE SHIELD

## 2015-04-06 ENCOUNTER — Other Ambulatory Visit (HOSPITAL_BASED_OUTPATIENT_CLINIC_OR_DEPARTMENT_OTHER): Payer: BLUE CROSS/BLUE SHIELD

## 2015-04-06 VITALS — BP 149/72 | HR 67 | Temp 97.1°F | Resp 18

## 2015-04-06 DIAGNOSIS — Z5112 Encounter for antineoplastic immunotherapy: Secondary | ICD-10-CM

## 2015-04-06 DIAGNOSIS — C3411 Malignant neoplasm of upper lobe, right bronchus or lung: Secondary | ICD-10-CM | POA: Diagnosis not present

## 2015-04-06 DIAGNOSIS — Z95828 Presence of other vascular implants and grafts: Secondary | ICD-10-CM

## 2015-04-06 DIAGNOSIS — Z5111 Encounter for antineoplastic chemotherapy: Secondary | ICD-10-CM | POA: Diagnosis not present

## 2015-04-06 LAB — COMPREHENSIVE METABOLIC PANEL
ALT: 9 U/L (ref 0–55)
AST: 15 U/L (ref 5–34)
Albumin: 3.7 g/dL (ref 3.5–5.0)
Alkaline Phosphatase: 88 U/L (ref 40–150)
Anion Gap: 11 mEq/L (ref 3–11)
BUN: 25.7 mg/dL (ref 7.0–26.0)
CHLORIDE: 104 meq/L (ref 98–109)
CO2: 25 meq/L (ref 22–29)
CREATININE: 1.2 mg/dL — AB (ref 0.6–1.1)
Calcium: 9.7 mg/dL (ref 8.4–10.4)
EGFR: 48 mL/min/{1.73_m2} — ABNORMAL LOW (ref >90)
GLUCOSE: 126 mg/dL (ref 70–140)
Potassium: 3.9 mEq/L (ref 3.5–5.1)
Sodium: 140 mEq/L (ref 136–145)
TOTAL PROTEIN: 8.2 g/dL (ref 6.4–8.3)
Total Bilirubin: 0.43 mg/dL (ref 0.20–1.20)

## 2015-04-06 LAB — CBC WITH DIFFERENTIAL/PLATELET
BASO%: 0.2 % (ref 0.0–2.0)
Basophils Absolute: 0 10*3/uL (ref 0.0–0.1)
EOS ABS: 0.1 10*3/uL (ref 0.0–0.5)
EOS%: 1.7 % (ref 0.0–7.0)
HCT: 36.9 % (ref 34.8–46.6)
HEMOGLOBIN: 12 g/dL (ref 11.6–15.9)
LYMPH%: 19.6 % (ref 14.0–49.7)
MCH: 33.1 pg (ref 25.1–34.0)
MCHC: 32.5 g/dL (ref 31.5–36.0)
MCV: 101.7 fL — AB (ref 79.5–101.0)
MONO#: 0.5 10*3/uL (ref 0.1–0.9)
MONO%: 9.5 % (ref 0.0–14.0)
NEUT%: 69 % (ref 38.4–76.8)
NEUTROS ABS: 3.6 10*3/uL (ref 1.5–6.5)
Platelets: 214 10*3/uL (ref 145–400)
RBC: 3.63 10*6/uL — AB (ref 3.70–5.45)
RDW: 14.7 % — AB (ref 11.2–14.5)
WBC: 5.3 10*3/uL (ref 3.9–10.3)
lymph#: 1 10*3/uL (ref 0.9–3.3)

## 2015-04-06 LAB — UA PROTEIN, DIPSTICK - CHCC: PROTEIN: 100 mg/dL

## 2015-04-06 MED ORDER — SODIUM CHLORIDE 0.9% FLUSH
10.0000 mL | INTRAVENOUS | Status: DC | PRN
  Administered 2015-04-06: 10 mL via INTRAVENOUS
  Filled 2015-04-06: qty 10

## 2015-04-06 MED ORDER — DIPHENHYDRAMINE HCL 50 MG/ML IJ SOLN
INTRAMUSCULAR | Status: AC
  Filled 2015-04-06: qty 1

## 2015-04-06 MED ORDER — DIPHENHYDRAMINE HCL 50 MG/ML IJ SOLN
50.0000 mg | Freq: Once | INTRAMUSCULAR | Status: AC
  Administered 2015-04-06: 50 mg via INTRAVENOUS

## 2015-04-06 MED ORDER — FAMOTIDINE IN NACL 20-0.9 MG/50ML-% IV SOLN
INTRAVENOUS | Status: AC
  Filled 2015-04-06: qty 50

## 2015-04-06 MED ORDER — HEPARIN SOD (PORK) LOCK FLUSH 100 UNIT/ML IV SOLN
500.0000 [IU] | Freq: Once | INTRAVENOUS | Status: AC | PRN
  Administered 2015-04-06: 500 [IU]
  Filled 2015-04-06: qty 5

## 2015-04-06 MED ORDER — FAMOTIDINE IN NACL 20-0.9 MG/50ML-% IV SOLN
20.0000 mg | Freq: Once | INTRAVENOUS | Status: AC
Start: 2015-04-06 — End: 2015-04-06
  Administered 2015-04-06: 20 mg via INTRAVENOUS

## 2015-04-06 MED ORDER — SODIUM CHLORIDE 0.9 % IV SOLN
15.0000 mg/kg | Freq: Once | INTRAVENOUS | Status: AC
  Administered 2015-04-06: 1500 mg via INTRAVENOUS
  Filled 2015-04-06: qty 48

## 2015-04-06 MED ORDER — SODIUM CHLORIDE 0.9 % IV SOLN
Freq: Once | INTRAVENOUS | Status: AC
  Administered 2015-04-06: 11:00:00 via INTRAVENOUS

## 2015-04-06 MED ORDER — PACLITAXEL CHEMO INJECTION 300 MG/50ML
36.0000 mg/m2 | Freq: Once | INTRAVENOUS | Status: AC
  Administered 2015-04-06: 84 mg via INTRAVENOUS
  Filled 2015-04-06: qty 14

## 2015-04-06 MED ORDER — SODIUM CHLORIDE 0.9 % IV SOLN
165.9200 mg | Freq: Once | INTRAVENOUS | Status: AC
  Administered 2015-04-06: 170 mg via INTRAVENOUS
  Filled 2015-04-06: qty 17

## 2015-04-06 MED ORDER — CARBOPLATIN CHEMO INTRADERMAL TEST DOSE 100MCG/0.02ML
100.0000 ug | Freq: Once | INTRADERMAL | Status: AC
  Administered 2015-04-06: 100 ug via INTRADERMAL
  Filled 2015-04-06: qty 0.02

## 2015-04-06 MED ORDER — SODIUM CHLORIDE 0.9 % IV SOLN
Freq: Once | INTRAVENOUS | Status: AC
  Administered 2015-04-06: 12:00:00 via INTRAVENOUS
  Filled 2015-04-06: qty 8

## 2015-04-06 MED ORDER — SODIUM CHLORIDE 0.9 % IJ SOLN
10.0000 mL | INTRAMUSCULAR | Status: DC | PRN
  Administered 2015-04-06: 10 mL
  Filled 2015-04-06: qty 10

## 2015-04-06 MED ORDER — SODIUM CHLORIDE 0.9 % IV SOLN: Freq: Once | INTRAVENOUS | Status: DC

## 2015-04-06 NOTE — Patient Instructions (Signed)
[facility] Discharge Instructions for Patients Receiving Chemotherapy  Today you received the following chemotherapy agents:  Taxol and Carboplatin  To help prevent nausea and vomiting after your treatment, we encourage you to take your nausea medication as ordered per MD.   If you develop nausea and vomiting that is not controlled by your nausea medication, call the clinic.   BELOW ARE SYMPTOMS THAT SHOULD BE REPORTED IMMEDIATELY:  *FEVER GREATER THAN 100.5 F  *CHILLS WITH OR WITHOUT FEVER  NAUSEA AND VOMITING THAT IS NOT CONTROLLED WITH YOUR NAUSEA MEDICATION  *UNUSUAL SHORTNESS OF BREATH  *UNUSUAL BRUISING OR BLEEDING  TENDERNESS IN MOUTH AND THROAT WITH OR WITHOUT PRESENCE OF ULCERS  *URINARY PROBLEMS  *BOWEL PROBLEMS  UNUSUAL RASH Items with * indicate a potential emergency and should be followed up as soon as possible.  Feel free to call the clinic you have any questions or concerns. The clinic phone number is [phone].  Please show the CHEMO ALERT CARD at check-in to the Emergency Department and triage nurse.   

## 2015-04-06 NOTE — Patient Instructions (Signed)

## 2015-04-19 ENCOUNTER — Other Ambulatory Visit (HOSPITAL_BASED_OUTPATIENT_CLINIC_OR_DEPARTMENT_OTHER): Payer: BLUE CROSS/BLUE SHIELD

## 2015-04-19 ENCOUNTER — Ambulatory Visit (HOSPITAL_COMMUNITY)
Admission: RE | Admit: 2015-04-19 | Discharge: 2015-04-19 | Disposition: A | Discharge status: Home / Self Care | Payer: BLUE CROSS/BLUE SHIELD | Source: Ambulatory Visit | Attending: Hematology and Oncology | Admitting: Hematology and Oncology

## 2015-04-19 ENCOUNTER — Ambulatory Visit: Payer: BLUE CROSS/BLUE SHIELD

## 2015-04-19 ENCOUNTER — Encounter (HOSPITAL_COMMUNITY): Payer: Self-pay

## 2015-04-19 ENCOUNTER — Other Ambulatory Visit: Payer: Self-pay | Admitting: Hematology and Oncology

## 2015-04-19 DIAGNOSIS — K7689 Other specified diseases of liver: Secondary | ICD-10-CM | POA: Diagnosis not present

## 2015-04-19 DIAGNOSIS — C3411 Malignant neoplasm of upper lobe, right bronchus or lung: Secondary | ICD-10-CM

## 2015-04-19 DIAGNOSIS — R590 Localized enlarged lymph nodes: Secondary | ICD-10-CM | POA: Diagnosis not present

## 2015-04-19 DIAGNOSIS — R918 Other nonspecific abnormal finding of lung field: Secondary | ICD-10-CM | POA: Insufficient documentation

## 2015-04-19 DIAGNOSIS — Z95828 Presence of other vascular implants and grafts: Secondary | ICD-10-CM

## 2015-04-19 LAB — COMPREHENSIVE METABOLIC PANEL
ALT: 13 U/L (ref 0–55)
AST: 15 U/L (ref 5–34)
Albumin: 3.6 g/dL (ref 3.5–5.0)
Alkaline Phosphatase: 78 U/L (ref 40–150)
Anion Gap: 10 mEq/L (ref 3–11)
BUN: 17.9 mg/dL (ref 7.0–26.0)
CHLORIDE: 105 meq/L (ref 98–109)
CO2: 26 meq/L (ref 22–29)
Calcium: 9.9 mg/dL (ref 8.4–10.4)
Creatinine: 1.1 mg/dL (ref 0.6–1.1)
EGFR: 55 mL/min/{1.73_m2} — ABNORMAL LOW (ref >90)
GLUCOSE: 122 mg/dL (ref 70–140)
POTASSIUM: 3.9 meq/L (ref 3.5–5.1)
SODIUM: 140 meq/L (ref 136–145)
Total Bilirubin: 0.36 mg/dL (ref 0.20–1.20)
Total Protein: 8.2 g/dL (ref 6.4–8.3)

## 2015-04-19 LAB — CBC WITH DIFFERENTIAL/PLATELET
BASO%: 0.2 % (ref 0.0–2.0)
BASOS ABS: 0 10*3/uL (ref 0.0–0.1)
EOS%: 1.1 % (ref 0.0–7.0)
Eosinophils Absolute: 0.1 10*3/uL (ref 0.0–0.5)
HCT: 38.2 % (ref 34.8–46.6)
HGB: 12.2 g/dL (ref 11.6–15.9)
LYMPH%: 17.3 % (ref 14.0–49.7)
MCH: 32.4 pg (ref 25.1–34.0)
MCHC: 31.9 g/dL (ref 31.5–36.0)
MCV: 101.3 fL — ABNORMAL HIGH (ref 79.5–101.0)
MONO#: 0.8 10*3/uL (ref 0.1–0.9)
MONO%: 12.1 % (ref 0.0–14.0)
NEUT#: 4.4 10*3/uL (ref 1.5–6.5)
NEUT%: 69.3 % (ref 38.4–76.8)
Platelets: 211 10*3/uL (ref 145–400)
RBC: 3.77 10*6/uL (ref 3.70–5.45)
RDW: 15 % — ABNORMAL HIGH (ref 11.2–14.5)
WBC: 6.4 10*3/uL (ref 3.9–10.3)
lymph#: 1.1 10*3/uL (ref 0.9–3.3)

## 2015-04-19 MED ORDER — SODIUM CHLORIDE 0.9% FLUSH
10.0000 mL | INTRAVENOUS | Status: DC | PRN
  Administered 2015-04-19: 10 mL via INTRAVENOUS
  Filled 2015-04-19: qty 10

## 2015-04-19 MED ORDER — IOHEXOL 300 MG/ML  SOLN
75.0000 mL | Freq: Once | INTRAMUSCULAR | Status: AC | PRN
  Administered 2015-04-19: 75 mL via INTRAVENOUS

## 2015-04-19 NOTE — Patient Instructions (Signed)

## 2015-04-20 ENCOUNTER — Telehealth: Payer: Self-pay | Admitting: Hematology and Oncology

## 2015-04-20 ENCOUNTER — Ambulatory Visit: Payer: BLUE CROSS/BLUE SHIELD

## 2015-04-20 ENCOUNTER — Ambulatory Visit (HOSPITAL_BASED_OUTPATIENT_CLINIC_OR_DEPARTMENT_OTHER): Payer: BLUE CROSS/BLUE SHIELD | Admitting: Hematology and Oncology

## 2015-04-20 VITALS — BP 157/72 | HR 92 | Temp 97.8°F | Resp 18 | Ht 74.0 in | Wt 239.6 lb

## 2015-04-20 DIAGNOSIS — C3411 Malignant neoplasm of upper lobe, right bronchus or lung: Secondary | ICD-10-CM | POA: Diagnosis not present

## 2015-04-20 DIAGNOSIS — G4700 Insomnia, unspecified: Secondary | ICD-10-CM

## 2015-04-20 DIAGNOSIS — I10 Essential (primary) hypertension: Secondary | ICD-10-CM | POA: Diagnosis not present

## 2015-04-20 DIAGNOSIS — R809 Proteinuria, unspecified: Secondary | ICD-10-CM

## 2015-04-20 MED ORDER — MIRTAZAPINE 15 MG PO TABS: ORAL_TABLET | ORAL | Status: DC

## 2015-04-20 NOTE — Telephone Encounter (Signed)
Gave adn printed appt shced and avs for pt for March and April

## 2015-04-20 NOTE — Progress Notes (Signed)
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   12/13/2013 Imaging CT scan show large mass in the right lung apex likely representing primary lung carcinoma or large metastasis. Metastases demonstrated in the pretracheal lymph nodes, T1 vertebral body and possibly sacrum.   12/14/2013 Initial Diagnosis Adenocarcinoma of upper lobe of right lung   12/14/2013 Pathology Results Accession: [id number] biopsy from right upper lobe showed adenocarcinoma. Foundation One testing was positive for several mutations without any known treatment options now   12/17/2013 Imaging MRI of the head is negative.   12/23/2013 - 02/05/2014 Radiation Therapy Jasmine Grant received radiation therapy to the mediastinal mass and bone   12/28/2013 Imaging PET/CT scan showed mediastinal mass, lymphadenopathy and T1 involvement.   01/01/2014 - 01/29/2014 Chemotherapy Weekly carboplatin and Taxol were added   03/12/2014 Imaging Repeat PET scan showed near complete response to treatment.   03/16/2014 - 05/28/2014 Chemotherapy Jasmine Grant started treatment with Alimta for maintenance   07/07/2014 Imaging PET scan showed disease progression   07/15/2014 - 09/23/2014 Chemotherapy Jasmine Grant received palliative treatment with Opdivo   08/13/2014 - 08/25/2014 Radiation Therapy Jasmine Grant completed palliative radiation therapy to the bone.   10/05/2014 Imaging PET CT showed disease progression except for site of  recent radiation therapy   10/13/2014 - 04/06/2015 Chemotherapy Jasmine Grant received Rx with weekly carbo/taxol, 2 weeks on 1 week off with Avastin every 3 weeks   12/15/2014 Imaging PET scan showed positive response to Rx   01/18/2015 Imaging Ct chest showed disease improvement   04/19/2015 Imaging Ct chest diease progression    INTERVAL HISTORY: Please see below for problem oriented charting. Jasmine Grant returns to review test results. Jasmine Grant denies back pain, chest pain or shortness of breath. Jasmine Grant has stable neuropathy.  REVIEW OF SYSTEMS:   Constitutional: Denies fevers, chills or abnormal weight loss Eyes: Denies blurriness of vision Ears, nose, mouth, throat, and face: Denies mucositis or sore throat Respiratory: Denies cough, dyspnea or wheezes Cardiovascular: Denies palpitation, chest discomfort or lower extremity swelling Gastrointestinal:  Denies nausea, heartburn or change in bowel habits Skin: Denies abnormal skin rashes Lymphatics: Denies new lymphadenopathy or easy bruising Neurological:Denies numbness, tingling or new weaknesses Behavioral/Psych: Mood is stable, no new changes  All other systems were reviewed with the patient and are negative.  I have reviewed the past medical history, past surgical history, social history and family history with the patient and they are unchanged from previous note.  ALLERGIES:  is allergic to codeine; penicillins; and sulfa antibiotics.  MEDICATIONS:  Current Outpatient Prescriptions  Medication Sig Dispense Refill  . amLODipine (NORVASC) 10 MG tablet Take 1 tablet (10 mg total) by mouth daily. 90 tablet 0  . fluticasone (FLONASE) 50 MCG/ACT nasal spray Place 2 sprays into both nostrils daily. 16 g 5  . hydrochlorothiazide (HYDRODIURIL) 25 MG tablet TAKE 1 TABLET BY MOUTH EVERY DAY 30 tablet 1  . levothyroxine (SYNTHROID,  LEVOTHROID) 200 MCG tablet TAKE 1 TABLET BY MOUTH DAILY BEFORE BREAKFAST. 90 tablet 0  . lidocaine-prilocaine (EMLA) cream Apply 1  application topically as needed. 90 g 0  . mirtazapine (REMERON) 15 MG tablet TAKE 1 TABLET (15 MG TOTAL) BY MOUTH AT BEDTIME AS NEEDED (FOR SLEEP.). 90 tablet 6  . ondansetron (ZOFRAN) 8 MG tablet TAKE 1 TABLET BY MOUTH EVERY 6 HOURS AS NEEDED FOR NAUSEA. 90 tablet 0  . ranitidine (ZANTAC) 150 MG tablet Take 1 tablet (150 mg total) by mouth every evening. 90 tablet 2  . sucralfate (CARAFATE) 1 G tablet Take 1 g by mouth 4 (four) times daily. Reported on 01/12/2015  0  . albuterol (PROVENTIL HFA;VENTOLIN HFA) 108 (90 BASE) MCG/ACT inhaler Inhale 2 puffs into the lungs every 4 (four) hours as needed for wheezing or shortness of breath. (Patient not taking: Reported on 04/20/2015) 1 Inhaler 5  . Fluticasone Furoate-Vilanterol (BREO ELLIPTA) 200-25 MCG/INH AEPB Inhale 1 puff into the lungs daily. Reported on 04/20/2015    . LORazepam (ATIVAN) 0.5 MG tablet TAKE 1 TABLET BY MOUTH EVERY 8 HOURS AS NEEDED FOR ANXIETY OR FOR NAUSEA (Patient not taking: Reported on 04/20/2015) 90 tablet 0  . magic mouthwash w/lidocaine SOLN Take 5 mLs by mouth 4 (four) times daily. Dukes Formula with Nystatin and Lidocaine 1:1 (Patient not taking: Reported on 04/20/2015) 300 mL 0   No current facility-administered medications for this visit.    PHYSICAL EXAMINATION: ECOG PERFORMANCE STATUS: 1 - Symptomatic but completely ambulatory  Filed Vitals:   04/20/15 1023  BP: 157/72  Pulse: 92  Temp: 97.8 F (36.6 C)  Resp: 18   Filed Weights   04/20/15 1023  Weight: 239 lb 9.6 oz (108.682 kg)    GENERAL:alert, no distress and comfortable. Jasmine Grant is obese SKIN: skin color, texture, turgor are normal, no rashes or significant lesions EYES: normal, Conjunctiva are pink and non-injected, sclera clear OROPHARYNX:no exudate, no erythema and lips, buccal mucosa, and tongue normal  NECK: supple, thyroid normal size, non-tender, without nodularity LYMPH:  no palpable lymphadenopathy in the cervical, axillary or inguinal LUNGS:  clear to auscultation and percussion with normal breathing effort HEART: regular rate & rhythm and no murmurs and no lower extremity edema ABDOMEN:abdomen soft, non-tender and normal bowel sounds Musculoskeletal:no cyanosis of digits and no clubbing  NEURO: alert & oriented x 3 with fluent speech, no focal motor/sensory deficits  LABORATORY DATA:  I have reviewed the data as listed    Component Value Date/Time   NA 140 04/19/2015 0932   NA 138 06/24/2014 1422   K 3.9 04/19/2015 0932   K 4.0 06/24/2014 1422   CL 96* 06/24/2014 1422   CO2 26 04/19/2015 0932   CO2 30 06/24/2014 1422   GLUCOSE 122 04/19/2015 0932   GLUCOSE 151* 06/24/2014 1422   BUN 17.9 04/19/2015 0932   BUN 13 06/24/2014 1422   CREATININE 1.1 04/19/2015 0932   CREATININE 1.54* 06/24/2014 1422   CALCIUM 9.9 04/19/2015 0932   CALCIUM 9.1 06/24/2014 1422   PROT 8.2 04/19/2015 0932   PROT 7.5 12/13/2013 2018   ALBUMIN 3.6 04/19/2015 0932   ALBUMIN 3.7 12/13/2013 2018   AST 15 04/19/2015 0932   AST 17 12/13/2013 2018   ALT 13 04/19/2015 0932   ALT 14 12/13/2013 2018   ALKPHOS 78 04/19/2015 0932   ALKPHOS 80 12/13/2013 2018   BILITOT 0.36 04/19/2015 0932   BILITOT 0.3 12/13/2013 2018   GFRNONAA 36* 06/24/2014 1422  GFRAA 41* 06/24/2014 1422    No results found for: SPEP, UPEP  Lab Results  Component Value Date   WBC 6.4 04/19/2015   NEUTROABS 4.4 04/19/2015   HGB 12.2 04/19/2015   HCT 38.2 04/19/2015   MCV 101.3* 04/19/2015   PLT 211 04/19/2015      Chemistry      Component Value Date/Time   NA 140 04/19/2015 0932   NA 138 06/24/2014 1422   K 3.9 04/19/2015 0932   K 4.0 06/24/2014 1422   CL 96* 06/24/2014 1422   CO2 26 04/19/2015 0932   CO2 30 06/24/2014 1422   BUN 17.9 04/19/2015 0932   BUN 13 06/24/2014 1422   CREATININE 1.1 04/19/2015 0932   CREATININE 1.54* 06/24/2014 1422      Component Value Date/Time   CALCIUM 9.9 04/19/2015 0932   CALCIUM 9.1 06/24/2014 1422   ALKPHOS 78  04/19/2015 0932   ALKPHOS 80 12/13/2013 2018   AST 15 04/19/2015 0932   AST 17 12/13/2013 2018   ALT 13 04/19/2015 0932   ALT 14 12/13/2013 2018   BILITOT 0.36 04/19/2015 0932   BILITOT 0.3 12/13/2013 2018       RADIOGRAPHIC STUDIES: I have personally reviewed the radiological images as listed and agreed with the findings in the report. Ct Chest W Contrast  04/19/2015  CLINICAL DATA:  60 year old female with history of right-sided lung cancer diagnosed in 2015, with bone metastases. Ongoing chemotherapy. EXAM: CT CHEST WITH CONTRAST TECHNIQUE: Multidetector CT imaging of the chest was performed during intravenous contrast administration. CONTRAST:  66m OMNIPAQUE IOHEXOL 300 MG/ML  SOLN COMPARISON:  Multiple priors, most recently chest CT 01/18/2015. FINDINGS: Mediastinum/Lymph Nodes: Heart size is normal. There is no significant pericardial fluid, thickening or pericardial calcification. Enlarging right hilar lymph node measuring 1.2 cm in short axis. Enlarging subcarinal lymph node measuring 1.8 cm in short axis (previously 1.4 cm). Mildly enlarged 1.1 cm right peritracheal lymph node, increased compared to the prior examination. Esophagus is unremarkable in appearance. No axillary lymphadenopathy. Right internal jugular single-lumen porta cath with tip terminating in the superior aspect of the right atrium. Lungs/Pleura: Treated mass in the paramediastinal aspect of the right upper lobe appears slightly larger than the prior examination, measuring up to 4.1 x 3.5 cm on today's study (images 11 and 12 of series 2) as compared with 3.9 x 3.1 cm on the prior examination. This mass appears to directly invade the superior mediastinum coming in direct contact with the superior aspect of the esophagus, and in direct contact with the undersurface of the right subclavian vein and proximal right vertebral artery, both of which appear partially encased by the lesion. There are surrounding areas of  architectural distortion in the apex of the right upper lobe, compatible with involving postradiation changes. Several other pulmonary nodules are again noted apparently stable in number compared to the prior examination, but increased in size. Specific examples include a 16 x 11 mm nodule in the inferior segment of the lingula (image 44 of series 5), previously only 11 mm), a 10 mm lingular nodule (image 22 of series 5) which previously measured only 7 mm, and 11 mm right middle lobe nodule (image 36 of series 5), which previously measured only 9 mm, and a 12 mm right lower lobe nodule (image 41 of series 5) which previously measured only 9 mm. Multiple other pulmonary nodules also appear generally increased in size. No acute consolidative airspace disease. No pleural effusions. Upper Abdomen: Multiple sub  cm low-attenuation lesions scattered throughout the hepatic parenchyma appear similar in size, number and distribution to prior examinations, presumably benign lesions such as tiny cysts or biliary hamartomas. Larger low-attenuation lesions in the right lobe of the liver are compatible with simple cysts, measuring up to 1.3 cm in segment 7. Musculoskeletal/Soft Tissues: Sclerotic lesions in C7 and T1 appear very similar to the prior examination, most likely to reflect treated metastasis. No other new aggressive appearing lytic or blastic lesions are noted in the visualized portions of the skeleton. IMPRESSION: 1. Today's study demonstrates apparent progression of disease, as evidenced by enlargement of the primary mass in the paramediastinal aspect of the right upper lobe, enlargement of the numerous previously demonstrated bilateral pulmonary nodules, and progressively worsening right hilar and mediastinal lymphadenopathy, as detailed above. 2. Additional incidental findings, as above. Electronically Signed   By: Vinnie Langton M.D.   On: 04/19/2015 12:48     ASSESSMENT & PLAN:  Adenocarcinoma of upper  lobe of right lung Unfortunately, Jasmine Grant developed disease progression. I review with her the current guidelines and previous Foundation One test results. Unfortunately, there are no targeted therapies for her available. We discussed some of the risks, benefits, side effects of various treatment options including cisplatin, docetaxel, gemcitabine and Navelbine and Jasmine Grant elected for combination therapy with gemcitabine and Navelbine. We will start her treatment next week. Some of the potential side effects of treatment include pancytopenia, risks of infection, constipation and neuropathy. Jasmine Grant understood the goal of therapy is palliative in nature and we elected for treatment on day 1 and day 8, rest day 15 every cycle of 21 days I will reassess side effects profile next month and plan on repeating imaging study at the end of May  Essential hypertension Her blood pressure fluctuated up and down. Recent urinalysis show mild proteinuria. I will continue to monitor her blood pressure carefully. Jasmine Grant will continue medical management    Orders Placed This Encounter  Procedures  . CBC with Differential    Standing Status: Standing     Number of Occurrences: 20     Standing Expiration Date: 04/20/2016  . Comprehensive metabolic panel    Standing Status: Standing     Number of Occurrences: 20     Standing Expiration Date: 04/20/2016   All questions were answered. The patient knows to call the clinic with any problems, questions or concerns. No barriers to learning was detected. I spent 25 minutes counseling the patient face to face. The total time spent in the appointment was 30 minutes and more than 50% was on counseling and review of test results     [facility], Najma Bozarth, MD 04/20/2015 11:20 AM

## 2015-04-20 NOTE — Assessment & Plan Note (Signed)
Unfortunately, she developed disease progression. I review with her the current guidelines and previous Foundation One test results. Unfortunately, there are no targeted therapies for her available. We discussed some of the risks, benefits, side effects of various treatment options including cisplatin, docetaxel, gemcitabine and Navelbine and she elected for combination therapy with gemcitabine and Navelbine. We will start her treatment next week. Some of the potential side effects of treatment include pancytopenia, risks of infection, constipation and neuropathy. She understood the goal of therapy is palliative in nature and we elected for treatment on day 1 and day 8, rest day 15 every cycle of 21 days I will reassess side effects profile next month and plan on repeating imaging study at the end of May

## 2015-04-20 NOTE — Assessment & Plan Note (Signed)
Her blood pressure fluctuated up and down. Recent urinalysis show mild proteinuria. I will continue to monitor her blood pressure carefully. She will continue medical management

## 2015-04-21 ENCOUNTER — Telehealth: Payer: Self-pay | Admitting: *Deleted

## 2015-04-21 NOTE — Telephone Encounter (Signed)
Pt lvm asking what Pre meds are ordered for her chemo next week?   Called pt back and lvm informing her Compazine tablet only ordered for pre med next week.  Please call back if any further questions or concerns.

## 2015-04-27 ENCOUNTER — Other Ambulatory Visit (HOSPITAL_BASED_OUTPATIENT_CLINIC_OR_DEPARTMENT_OTHER): Payer: BLUE CROSS/BLUE SHIELD

## 2015-04-27 ENCOUNTER — Ambulatory Visit: Payer: BLUE CROSS/BLUE SHIELD

## 2015-04-27 ENCOUNTER — Ambulatory Visit (HOSPITAL_BASED_OUTPATIENT_CLINIC_OR_DEPARTMENT_OTHER): Payer: BLUE CROSS/BLUE SHIELD

## 2015-04-27 VITALS — BP 169/88 | HR 78 | Temp 98.4°F | Resp 18

## 2015-04-27 DIAGNOSIS — Z5111 Encounter for antineoplastic chemotherapy: Secondary | ICD-10-CM | POA: Diagnosis not present

## 2015-04-27 DIAGNOSIS — C3411 Malignant neoplasm of upper lobe, right bronchus or lung: Secondary | ICD-10-CM

## 2015-04-27 DIAGNOSIS — Z95828 Presence of other vascular implants and grafts: Secondary | ICD-10-CM

## 2015-04-27 LAB — COMPREHENSIVE METABOLIC PANEL
ALT: 12 U/L (ref 0–55)
AST: 16 U/L (ref 5–34)
Albumin: 3.5 g/dL (ref 3.5–5.0)
Alkaline Phosphatase: 72 U/L (ref 40–150)
Anion Gap: 11 mEq/L (ref 3–11)
BUN: 16.3 mg/dL (ref 7.0–26.0)
CALCIUM: 10 mg/dL (ref 8.4–10.4)
CHLORIDE: 104 meq/L (ref 98–109)
CO2: 26 mEq/L (ref 22–29)
Creatinine: 1 mg/dL (ref 0.6–1.1)
EGFR: 60 mL/min/{1.73_m2} — ABNORMAL LOW (ref >90)
GLUCOSE: 117 mg/dL (ref 70–140)
POTASSIUM: 4.1 meq/L (ref 3.5–5.1)
SODIUM: 142 meq/L (ref 136–145)
Total Bilirubin: 0.34 mg/dL (ref 0.20–1.20)
Total Protein: 8.3 g/dL (ref 6.4–8.3)

## 2015-04-27 LAB — CBC WITH DIFFERENTIAL/PLATELET
BASO%: 0.5 % (ref 0.0–2.0)
BASOS ABS: 0 10*3/uL (ref 0.0–0.1)
EOS%: 1.3 % (ref 0.0–7.0)
Eosinophils Absolute: 0.1 10*3/uL (ref 0.0–0.5)
HCT: 39.5 % (ref 34.8–46.6)
HGB: 12.7 g/dL (ref 11.6–15.9)
LYMPH#: 0.9 10*3/uL (ref 0.9–3.3)
LYMPH%: 11.7 % — AB (ref 14.0–49.7)
MCH: 32 pg (ref 25.1–34.0)
MCHC: 32 g/dL (ref 31.5–36.0)
MCV: 99.7 fL (ref 79.5–101.0)
MONO#: 0.8 10*3/uL (ref 0.1–0.9)
MONO%: 10.2 % (ref 0.0–14.0)
NEUT#: 5.6 10*3/uL (ref 1.5–6.5)
NEUT%: 76.3 % (ref 38.4–76.8)
Platelets: 218 10*3/uL (ref 145–400)
RBC: 3.96 10*6/uL (ref 3.70–5.45)
RDW: 16.4 % — ABNORMAL HIGH (ref 11.2–14.5)
WBC: 7.4 10*3/uL (ref 3.9–10.3)

## 2015-04-27 MED ORDER — SODIUM CHLORIDE 0.9% FLUSH
10.0000 mL | INTRAVENOUS | Status: DC | PRN
  Administered 2015-04-27: 10 mL
  Filled 2015-04-27: qty 10

## 2015-04-27 MED ORDER — VINORELBINE TARTRATE CHEMO INJECTION 50 MG/5ML
25.0000 mg/m2 | Freq: Once | INTRAVENOUS | Status: AC
  Administered 2015-04-27: 60 mg via INTRAVENOUS
  Filled 2015-04-27: qty 5

## 2015-04-27 MED ORDER — HEPARIN SOD (PORK) LOCK FLUSH 100 UNIT/ML IV SOLN
500.0000 [IU] | Freq: Once | INTRAVENOUS | Status: AC | PRN
  Administered 2015-04-27: 500 [IU]
  Filled 2015-04-27: qty 5

## 2015-04-27 MED ORDER — SODIUM CHLORIDE 0.9 % IV SOLN
2400.0000 mg | Freq: Once | INTRAVENOUS | Status: AC
  Administered 2015-04-27: 2400 mg via INTRAVENOUS
  Filled 2015-04-27: qty 63.12

## 2015-04-27 MED ORDER — PROCHLORPERAZINE MALEATE 10 MG PO TABS
ORAL_TABLET | ORAL | Status: AC
  Filled 2015-04-27: qty 1

## 2015-04-27 MED ORDER — PROCHLORPERAZINE MALEATE 10 MG PO TABS
10.0000 mg | ORAL_TABLET | Freq: Once | ORAL | Status: AC
  Administered 2015-04-27: 10 mg via ORAL

## 2015-04-27 MED ORDER — SODIUM CHLORIDE 0.9% FLUSH
10.0000 mL | INTRAVENOUS | Status: DC | PRN
  Administered 2015-04-27: 10 mL via INTRAVENOUS
  Filled 2015-04-27: qty 10

## 2015-04-27 MED ORDER — SODIUM CHLORIDE 0.9 % IV SOLN
INTRAVENOUS | Status: DC
  Administered 2015-04-27: 10:00:00 via INTRAVENOUS

## 2015-04-27 NOTE — Patient Instructions (Signed)

## 2015-04-27 NOTE — Patient Instructions (Signed)
[facility] Discharge Instructions for Patients Receiving Chemotherapy  Today you received the following chemotherapy agents Navelbine and Gemzar.  To help prevent nausea and vomiting after your treatment, we encourage you to take your nausea medication as prescribed.   If you develop nausea and vomiting that is not controlled by your nausea medication, call the clinic.   BELOW ARE SYMPTOMS THAT SHOULD BE REPORTED IMMEDIATELY:  *FEVER GREATER THAN 100.5 F  *CHILLS WITH OR WITHOUT FEVER  NAUSEA AND VOMITING THAT IS NOT CONTROLLED WITH YOUR NAUSEA MEDICATION  *UNUSUAL SHORTNESS OF BREATH  *UNUSUAL BRUISING OR BLEEDING  TENDERNESS IN MOUTH AND THROAT WITH OR WITHOUT PRESENCE OF ULCERS  *URINARY PROBLEMS  *BOWEL PROBLEMS  UNUSUAL RASH Items with * indicate a potential emergency and should be followed up as soon as possible.  Feel free to call the clinic you have any questions or concerns. The clinic phone number is [phone].  Please show the [facility] at check-in to the Emergency Department and triage nurse.

## 2015-04-28 ENCOUNTER — Other Ambulatory Visit: Payer: Self-pay | Admitting: Hematology and Oncology

## 2015-04-28 ENCOUNTER — Telehealth: Payer: Self-pay | Admitting: *Deleted

## 2015-04-28 NOTE — Telephone Encounter (Signed)
Called pt for follow up of new treatment yesterday.  Pt stating she feels "better" today.  Pt feels compazine made her feel dizzy/lightheaded yesterday. Also had no appetite yesterday.  Asked if she could switch back to the zofran she had with prior treatment.  Dr. Alvy Bimler notifed.  Pt appetite better today.  Otherwise no issues.  Pt thankful for call.

## 2015-04-29 ENCOUNTER — Other Ambulatory Visit: Payer: Self-pay | Admitting: Hematology and Oncology

## 2015-04-29 ENCOUNTER — Telehealth: Payer: Self-pay | Admitting: *Deleted

## 2015-04-29 ENCOUNTER — Ambulatory Visit (HOSPITAL_BASED_OUTPATIENT_CLINIC_OR_DEPARTMENT_OTHER): Payer: BLUE CROSS/BLUE SHIELD

## 2015-04-29 ENCOUNTER — Encounter: Payer: Self-pay | Admitting: Hematology and Oncology

## 2015-04-29 DIAGNOSIS — C3411 Malignant neoplasm of upper lobe, right bronchus or lung: Secondary | ICD-10-CM

## 2015-04-29 DIAGNOSIS — N390 Urinary tract infection, site not specified: Secondary | ICD-10-CM

## 2015-04-29 HISTORY — DX: Urinary tract infection, site not specified: N39.0

## 2015-04-29 LAB — URINALYSIS, MICROSCOPIC - CHCC
Bilirubin (Urine): NEGATIVE
GLUCOSE UR CHCC: NEGATIVE mg/dL
Ketones: NEGATIVE mg/dL
Nitrite: NEGATIVE
PH: 6 (ref 4.6–8.0)
Protein: 300 mg/dL
Specific Gravity, Urine: 1.025 (ref 1.003–1.035)
UROBILINOGEN UR: 0.2 mg/dL (ref 0.2–1)

## 2015-04-29 MED ORDER — NITROFURANTOIN MONOHYD MACRO 100 MG PO CAPS: 100.0000 mg | ORAL_CAPSULE | Freq: Two times a day (BID) | ORAL | Status: DC

## 2015-04-29 NOTE — Telephone Encounter (Signed)
Notified pt of U/A shows Infection.  Dr. Alvy Bimler sent Rx for Macrobid twice daily for 7 days to pt's pharmacy.  Instructed pt to start today and call us Monday if no improvement in symptoms or any worsening symptoms.  Encouraged her to drink plenty of fluids.  She verbalized understanding.

## 2015-04-29 NOTE — Telephone Encounter (Signed)
VM message received from pt at 10:19 am stating that she believes she has another UTI. TC back to patient. She states she is having frequency and dysuria. Denies fever, chills. Drinking increased fluids..  She states she has had these before and that Dr. Alvy Bimler did not want to re-prescribe Cipro but would try another antibiotic. Advised pt to come in for U/A and urine culture. Pt states that she would come in this afternoon.  POF sent.

## 2015-05-02 ENCOUNTER — Telehealth: Payer: Self-pay | Admitting: *Deleted

## 2015-05-02 MED ORDER — LEVOFLOXACIN 500 MG PO TABS: 500.0000 mg | ORAL_TABLET | Freq: Every day | ORAL | Status: DC

## 2015-05-02 NOTE — Telephone Encounter (Signed)
Pt reports Feeling like she has been "hit by a 2 x 4" since Friday.  She has developed coughing, congestion, yellow-green sputum, chills, temp 99.7, and nausea since Friday evening.  States she is taking Macrobid for UTI and thinks that is causing the nausea.  Also c/o no appetite.

## 2015-05-02 NOTE — Telephone Encounter (Signed)
Notified Dr. Alvy Bimler of pt's symptoms.  Dr. Alvy Bimler instructs pt to stop Macrobid and start Levaquin 500 mg daily x 7 days.  Stay out of work for at least 7 days.  Cancel Chemo this week and return for next treatment on 4/19 as scheduled.  Call us if symptoms get worse, not better over the next few days.  Rx Levaquin sent to [facility].  Notified pt of above instructions.  She verbalized understanding.

## 2015-05-04 ENCOUNTER — Telehealth: Payer: Self-pay | Admitting: *Deleted

## 2015-05-04 ENCOUNTER — Other Ambulatory Visit: Payer: BLUE CROSS/BLUE SHIELD

## 2015-05-04 ENCOUNTER — Ambulatory Visit: Payer: BLUE CROSS/BLUE SHIELD

## 2015-05-04 NOTE — Telephone Encounter (Signed)
FYI "I called Monday.  Have URI, UTI and started Levaquin.  It feels like my lungs are on fire.  Feeling bad since Friday and not getting better.  When will I feel better?  Am I doing something wrong?  Temp = 98.7, cough yellowish white phlegm, I'm weak and hurt on the inside.  Using tylenol for pain because other pain cause me to sleep or knock me out." Return number [phone].   Speech clear and no audible respiratory distress.  Advised she monitor temperature before taking tylenol, FF, complete the antibiotic as ordered.  Call if temp >100.5 or respiratory distress.

## 2015-05-05 LAB — URINE CULTURE

## 2015-05-06 ENCOUNTER — Telehealth: Payer: Self-pay | Admitting: *Deleted

## 2015-05-06 NOTE — Telephone Encounter (Signed)
Pt reports she is feeling "80% better but the Levaquin is hurting her stomach.  Zantac helps her stomach feel better but is only prescribed once a day.  She asks if ok to take Zantac twice a day for her "sour stomach?"  Pt has 3 days of Levaquin left to take.  Instructed pt ok w/ Dr. Alvy Bimler for her to increase Zantac to twice daily for next 5 days.   Instructed pt to call us back if symptoms worsen or any other problems.  She verbalized understanding.

## 2015-05-17 ENCOUNTER — Other Ambulatory Visit: Payer: Self-pay | Admitting: Hematology and Oncology

## 2015-05-17 ENCOUNTER — Telehealth: Payer: Self-pay | Admitting: Hematology and Oncology

## 2015-05-17 NOTE — Telephone Encounter (Signed)
Per 4/18 pof added f/u appointment to 4/19 lab/tx. Added f/u for 10;15 am per pof and adjusted lab/flush to 9:15 am. Left message for patient re add on and new appointment time for 4/19 @ 9:15 am.

## 2015-05-18 ENCOUNTER — Telehealth: Payer: Self-pay | Admitting: Hematology and Oncology

## 2015-05-18 ENCOUNTER — Ambulatory Visit: Payer: BLUE CROSS/BLUE SHIELD

## 2015-05-18 ENCOUNTER — Other Ambulatory Visit: Payer: Self-pay | Admitting: Hematology and Oncology

## 2015-05-18 ENCOUNTER — Ambulatory Visit (HOSPITAL_BASED_OUTPATIENT_CLINIC_OR_DEPARTMENT_OTHER): Payer: BLUE CROSS/BLUE SHIELD

## 2015-05-18 ENCOUNTER — Other Ambulatory Visit (HOSPITAL_BASED_OUTPATIENT_CLINIC_OR_DEPARTMENT_OTHER): Payer: BLUE CROSS/BLUE SHIELD

## 2015-05-18 ENCOUNTER — Encounter: Payer: Self-pay | Admitting: Hematology and Oncology

## 2015-05-18 ENCOUNTER — Ambulatory Visit (HOSPITAL_BASED_OUTPATIENT_CLINIC_OR_DEPARTMENT_OTHER): Payer: BLUE CROSS/BLUE SHIELD | Admitting: Hematology and Oncology

## 2015-05-18 VITALS — BP 142/85 | HR 74 | Temp 98.1°F | Resp 18 | Ht 74.0 in | Wt 240.6 lb

## 2015-05-18 DIAGNOSIS — I10 Essential (primary) hypertension: Secondary | ICD-10-CM | POA: Diagnosis not present

## 2015-05-18 DIAGNOSIS — E039 Hypothyroidism, unspecified: Secondary | ICD-10-CM | POA: Diagnosis not present

## 2015-05-18 DIAGNOSIS — Z515 Encounter for palliative care: Secondary | ICD-10-CM

## 2015-05-18 DIAGNOSIS — Z95828 Presence of other vascular implants and grafts: Secondary | ICD-10-CM

## 2015-05-18 DIAGNOSIS — Z79899 Other long term (current) drug therapy: Secondary | ICD-10-CM | POA: Diagnosis not present

## 2015-05-18 DIAGNOSIS — C3411 Malignant neoplasm of upper lobe, right bronchus or lung: Secondary | ICD-10-CM | POA: Diagnosis not present

## 2015-05-18 DIAGNOSIS — T451X5A Adverse effect of antineoplastic and immunosuppressive drugs, initial encounter: Secondary | ICD-10-CM

## 2015-05-18 DIAGNOSIS — Z5111 Encounter for antineoplastic chemotherapy: Secondary | ICD-10-CM

## 2015-05-18 DIAGNOSIS — G620 Drug-induced polyneuropathy: Secondary | ICD-10-CM

## 2015-05-18 DIAGNOSIS — E038 Other specified hypothyroidism: Secondary | ICD-10-CM

## 2015-05-18 LAB — COMPREHENSIVE METABOLIC PANEL
ALBUMIN: 3.7 g/dL (ref 3.5–5.0)
ALK PHOS: 80 U/L (ref 40–150)
ALT: 13 U/L (ref 0–55)
ANION GAP: 12 meq/L — AB (ref 3–11)
AST: 18 U/L (ref 5–34)
BILIRUBIN TOTAL: 0.39 mg/dL (ref 0.20–1.20)
BUN: 13.5 mg/dL (ref 7.0–26.0)
CO2: 25 meq/L (ref 22–29)
CREATININE: 1 mg/dL (ref 0.6–1.1)
Calcium: 9.9 mg/dL (ref 8.4–10.4)
Chloride: 104 mEq/L (ref 98–109)
EGFR: 58 mL/min/{1.73_m2} — ABNORMAL LOW (ref >90)
Glucose: 114 mg/dl (ref 70–140)
Potassium: 4.1 mEq/L (ref 3.5–5.1)
Sodium: 141 mEq/L (ref 136–145)
TOTAL PROTEIN: 8.3 g/dL (ref 6.4–8.3)

## 2015-05-18 LAB — CBC WITH DIFFERENTIAL/PLATELET
BASO%: 0.5 % (ref 0.0–2.0)
Basophils Absolute: 0 10*3/uL (ref 0.0–0.1)
EOS ABS: 0.1 10*3/uL (ref 0.0–0.5)
EOS%: 2.3 % (ref 0.0–7.0)
HEMATOCRIT: 39.4 % (ref 34.8–46.6)
HEMOGLOBIN: 12.4 g/dL (ref 11.6–15.9)
LYMPH#: 1.4 10*3/uL (ref 0.9–3.3)
LYMPH%: 24.2 % (ref 14.0–49.7)
MCH: 31.3 pg (ref 25.1–34.0)
MCHC: 31.5 g/dL (ref 31.5–36.0)
MCV: 99.5 fL (ref 79.5–101.0)
MONO#: 0.7 10*3/uL (ref 0.1–0.9)
MONO%: 12.1 % (ref 0.0–14.0)
NEUT%: 60.9 % (ref 38.4–76.8)
NEUTROS ABS: 3.5 10*3/uL (ref 1.5–6.5)
PLATELETS: 285 10*3/uL (ref 145–400)
RBC: 3.96 10*6/uL (ref 3.70–5.45)
RDW: 15.9 % — AB (ref 11.2–14.5)
WBC: 5.7 10*3/uL (ref 3.9–10.3)

## 2015-05-18 LAB — TSH: TSH: 5.159 m[IU]/L — AB (ref 0.308–3.960)

## 2015-05-18 MED ORDER — SODIUM CHLORIDE 0.9 % IV SOLN
2400.0000 mg | Freq: Once | INTRAVENOUS | Status: AC
  Administered 2015-05-18: 2400 mg via INTRAVENOUS
  Filled 2015-05-18: qty 63.12

## 2015-05-18 MED ORDER — VINORELBINE TARTRATE CHEMO INJECTION 50 MG/5ML
25.0000 mg/m2 | Freq: Once | INTRAVENOUS | Status: AC
  Administered 2015-05-18: 60 mg via INTRAVENOUS
  Filled 2015-05-18: qty 1

## 2015-05-18 MED ORDER — SODIUM CHLORIDE 0.9% FLUSH
10.0000 mL | INTRAVENOUS | Status: DC | PRN
  Administered 2015-05-18: 10 mL via INTRAVENOUS
  Filled 2015-05-18: qty 10

## 2015-05-18 MED ORDER — SODIUM CHLORIDE 0.9 % IV SOLN
INTRAVENOUS | Status: DC
  Administered 2015-05-18: 11:00:00 via INTRAVENOUS

## 2015-05-18 MED ORDER — ONDANSETRON HCL 8 MG PO TABS
ORAL_TABLET | ORAL | Status: AC
  Filled 2015-05-18: qty 1

## 2015-05-18 MED ORDER — ONDANSETRON HCL 8 MG PO TABS
8.0000 mg | ORAL_TABLET | Freq: Once | ORAL | Status: AC
  Administered 2015-05-18: 8 mg via ORAL

## 2015-05-18 MED ORDER — HEPARIN SOD (PORK) LOCK FLUSH 100 UNIT/ML IV SOLN
500.0000 [IU] | Freq: Once | INTRAVENOUS | Status: AC | PRN
  Administered 2015-05-18: 500 [IU]
  Filled 2015-05-18: qty 5

## 2015-05-18 MED ORDER — SODIUM CHLORIDE 0.9% FLUSH
10.0000 mL | INTRAVENOUS | Status: DC | PRN
  Administered 2015-05-18: 10 mL
  Filled 2015-05-18: qty 10

## 2015-05-18 NOTE — Assessment & Plan Note (Signed)
Her blood pressure fluctuated up and down. Recent urinalysis show mild proteinuria. I will continue to monitor her blood pressure carefully. She will continue medical management

## 2015-05-18 NOTE — Progress Notes (Signed)
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   12/13/2013 Imaging CT scan show large mass in the right lung apex likely representing primary lung carcinoma or large metastasis. Metastases demonstrated in the pretracheal lymph nodes, T1 vertebral body and possibly sacrum.   12/14/2013 Initial Diagnosis Adenocarcinoma of upper lobe of right lung   12/14/2013 Pathology Results Accession: [id number] biopsy from right upper lobe showed adenocarcinoma. [facility] testing was positive for several mutations without any known treatment options now   12/17/2013 Imaging MRI of the head is negative.   12/23/2013 - 02/05/2014 Radiation Therapy She received radiation therapy to the mediastinal mass and bone   12/28/2013 Imaging PET/CT scan showed mediastinal mass, lymphadenopathy and T1 involvement.   01/01/2014 - 01/29/2014 Chemotherapy Weekly carboplatin and Taxol were added   03/12/2014 Imaging Repeat PET scan showed near complete response to treatment.   03/16/2014 - 05/28/2014 Chemotherapy She started treatment with Alimta for maintenance   07/07/2014 Imaging PET scan showed disease progression   07/15/2014 - 09/23/2014 Chemotherapy She received palliative treatment with Opdivo   08/13/2014 - 08/25/2014 Radiation Therapy She completed palliative radiation therapy to the bone.   10/05/2014 Imaging PET CT showed disease progression except for site of  recent radiation therapy   10/13/2014 - 04/06/2015 Chemotherapy She received Rx with weekly carbo/taxol, 2 weeks on 1 week off with Avastin every 3 weeks   12/15/2014 Imaging PET scan showed positive response to Rx   01/18/2015 Imaging Ct chest showed disease improvement   04/19/2015 Imaging Ct chest diease progression   04/27/2015 -  Chemotherapy Treatment is switched to Gemzar and Navelbine   05/04/2015 Adverse Reaction Treatment was missed & interrupted due to recurrent UTI    INTERVAL HISTORY: Please see below for problem oriented charting. She is seen prior to cycle 2 of treatment. Her treatment 2 weeks ago was omitted due to recurrent urinary tract infection. Since then, her issues with reported infection has resolved. She denies worsening neuropathy. She complained of fatigue. She is currently in the process of arranging for advanced directives and living will to be notarized.  REVIEW OF SYSTEMS:   Constitutional: Denies fevers, chills or abnormal weight loss Eyes: Denies blurriness of vision Ears, nose, mouth, throat, and face: Denies mucositis or sore throat Respiratory: Denies cough, dyspnea or wheezes Cardiovascular: Denies palpitation, chest discomfort  Gastrointestinal:  Denies nausea, heartburn or change in bowel habits Skin: Denies abnormal skin rashes Lymphatics: Denies new lymphadenopathy or easy bruising Neurological:Denies numbness, tingling or new weaknesses Behavioral/Psych: Mood is stable, no new changes  All other systems were reviewed with the patient and are negative.  I have reviewed the past medical history, past surgical history, social history and family history with the patient and they are unchanged from previous note.  ALLERGIES:  is allergic to codeine; penicillins; and sulfa antibiotics.  MEDICATIONS:  Current Outpatient Prescriptions  Medication Sig  Dispense Refill  . albuterol (PROVENTIL HFA;VENTOLIN HFA) 108 (90 BASE) MCG/ACT inhaler Inhale 2 puffs  into the lungs every 4 (four) hours as needed for wheezing or shortness of breath. (Patient not taking: Reported on 04/20/2015) 1 Inhaler 5  . amLODipine (NORVASC) 10 MG tablet Take 1 tablet (10 mg total) by mouth daily. 90 tablet 0  . fluticasone (FLONASE) 50 MCG/ACT nasal spray Place 2 sprays into both nostrils daily. 16 g 5  . Fluticasone Furoate-Vilanterol (BREO ELLIPTA) 200-25 MCG/INH AEPB Inhale 1 puff into the lungs daily. Reported on 04/20/2015    . hydrochlorothiazide (HYDRODIURIL) 25 MG tablet TAKE 1 TABLET BY MOUTH EVERY DAY 30 tablet 1  . levofloxacin (LEVAQUIN) 500 MG tablet Take 1 tablet (500 mg total) by mouth daily. 7 tablet 0  . levothyroxine (SYNTHROID, LEVOTHROID) 200 MCG tablet TAKE 1 TABLET BY MOUTH DAILY BEFORE BREAKFAST. 90 tablet 0  . lidocaine-prilocaine (EMLA) cream Apply 1 application topically as needed. 90 g 0  . LORazepam (ATIVAN) 0.5 MG tablet TAKE 1 TABLET BY MOUTH EVERY 8 HOURS AS NEEDED FOR ANXIETY OR FOR NAUSEA (Patient not taking: Reported on 04/20/2015) 90 tablet 0  . magic mouthwash w/lidocaine SOLN Take 5 mLs by mouth 4 (four) times daily. Dukes Formula with Nystatin and Lidocaine 1:1 (Patient not taking: Reported on 04/20/2015) 300 mL 0  . mirtazapine (REMERON) 15 MG tablet TAKE 1 TABLET (15 MG TOTAL) BY MOUTH AT BEDTIME AS NEEDED (FOR SLEEP.). 90 tablet 6  . ondansetron (ZOFRAN) 8 MG tablet TAKE 1 TABLET BY MOUTH EVERY 6 HOURS AS NEEDED FOR NAUSEA. 90 tablet 0  . ranitidine (ZANTAC) 150 MG tablet Take 1 tablet (150 mg total) by mouth every evening. 90 tablet 2  . sucralfate (CARAFATE) 1 G tablet Take 1 g by mouth 4 (four) times daily. Reported on 01/12/2015  0   No current facility-administered medications for this visit.   Facility-Administered Medications Ordered in Other Visits  Medication Dose Route Frequency Provider Last Rate Last Dose  . 0.9 %  sodium chloride infusion   Intravenous Continuous [name], MD 20 mL/hr at 05/18/15 1106    . heparin lock  flush 100 unit/mL  500 Units Intracatheter Once PRN [name], MD      . sodium chloride flush (NS) 0.9 % injection 10 mL  10 mL Intracatheter PRN [name], MD        PHYSICAL EXAMINATION: ECOG PERFORMANCE STATUS: 1 - Symptomatic but completely ambulatory  Filed Vitals:   05/18/15 1008  BP: 142/85  Pulse: 74  Temp: 98.1 F (36.7 C)  Resp: 18   Filed Weights   05/18/15 1008  Weight: 240 lb 9.6 oz (109.135 kg)    GENERAL:alert, no distress and comfortable. She is obese SKIN: skin color, texture, turgor are normal, no rashes or significant lesions EYES: normal, Conjunctiva are pink and non-injected, sclera clear OROPHARYNX:no exudate, no erythema and lips, buccal mucosa, and tongue normal  NECK: supple, thyroid normal size, non-tender, without nodularity LYMPH:  no palpable lymphadenopathy in the cervical, axillary or inguinal LUNGS: clear to auscultation and percussion with normal breathing effort HEART: regular rate & rhythm and no murmurs with moderate bilateral lower extremity edema ABDOMEN:abdomen soft, non-tender and normal bowel sounds Musculoskeletal:no cyanosis of digits and no clubbing  NEURO: alert & oriented x 3 with fluent speech, no focal motor/sensory deficits  LABORATORY DATA:  I have reviewed the data as listed    Component Value Date/Time   NA 141 05/18/2015 0946  NA 138 06/24/2014 1422   K 4.1 05/18/2015 0946   K 4.0 06/24/2014 1422   CL 96* 06/24/2014 1422   CO2 25 05/18/2015 0946   CO2 30 06/24/2014 1422   GLUCOSE 114 05/18/2015 0946   GLUCOSE 151* 06/24/2014 1422   BUN 13.5 05/18/2015 0946   BUN 13 06/24/2014 1422   CREATININE 1.0 05/18/2015 0946   CREATININE 1.54* 06/24/2014 1422   CALCIUM 9.9 05/18/2015 0946   CALCIUM 9.1 06/24/2014 1422   PROT 8.3 05/18/2015 0946   PROT 7.5 12/13/2013 2018   ALBUMIN 3.7 05/18/2015 0946   ALBUMIN 3.7 12/13/2013 2018   AST 18 05/18/2015 0946   AST 17 12/13/2013 2018   ALT 13 05/18/2015 0946   ALT 14  12/13/2013 2018   ALKPHOS 80 05/18/2015 0946   ALKPHOS 80 12/13/2013 2018   BILITOT 0.39 05/18/2015 0946   BILITOT 0.3 12/13/2013 2018   GFRNONAA 36* 06/24/2014 1422   GFRAA 41* 06/24/2014 1422    No results found for: SPEP, UPEP  Lab Results  Component Value Date   WBC 5.7 05/18/2015   NEUTROABS 3.5 05/18/2015   HGB 12.4 05/18/2015   HCT 39.4 05/18/2015   MCV 99.5 05/18/2015   PLT 285 05/18/2015      Chemistry      Component Value Date/Time   NA 141 05/18/2015 0946   NA 138 06/24/2014 1422   K 4.1 05/18/2015 0946   K 4.0 06/24/2014 1422   CL 96* 06/24/2014 1422   CO2 25 05/18/2015 0946   CO2 30 06/24/2014 1422   BUN 13.5 05/18/2015 0946   BUN 13 06/24/2014 1422   CREATININE 1.0 05/18/2015 0946   CREATININE 1.54* 06/24/2014 1422      Component Value Date/Time   CALCIUM 9.9 05/18/2015 0946   CALCIUM 9.1 06/24/2014 1422   ALKPHOS 80 05/18/2015 0946   ALKPHOS 80 12/13/2013 2018   AST 18 05/18/2015 0946   AST 17 12/13/2013 2018   ALT 13 05/18/2015 0946   ALT 14 12/13/2013 2018   BILITOT 0.39 05/18/2015 0946   BILITOT 0.3 12/13/2013 2018      ASSESSMENT & PLAN:  Adenocarcinoma of upper lobe of right lung She tolerated treatment cycle 1 day 1 well but missed her second week of treatment due to recent urinary tract infection.  She denies peripheral neuropathy getting worse. Her recent infection has resolved. We will resume cycle 2 today without dose adjustment  Neuropathy due to chemotherapeutic drug ([place]) Neuropathy stable at grade 1. Continue same dose adjustment. I will reassess next month     Acquired hypothyroidism She had exposure to radiation and background hypothyroidism. She complained of chronic fatigue. Her recent TSH is stable. We will continue on current dose of medication without dosage adjustment.  Essential hypertension Her blood pressure fluctuated up and down. Recent urinalysis show mild proteinuria. I will continue to monitor her  blood pressure carefully. She will continue medical management   Palliative care by specialist The patient is aware she has stage IV lung cancer and treatment is strictly palliative. She has progressed on multiple lines of treatment. We discussed importance of Advanced Directives and Living will. I will get assistance from our social worker to help her fill out some paperwork. When I questioned about her CODE STATUS, the patient desired to remain in full code. We also discussed potential referral for home base palliative care but the patient declined for now.   No orders of the defined types were placed in  this encounter.   All questions were answered. The patient knows to call the clinic with any problems, questions or concerns. No barriers to learning was detected. I spent 30 minutes counseling the patient face to face. The total time spent in the appointment was 40 minutes and more than 50% was on counseling and review of test results     [facility], [place], MD 05/18/2015 12:30 PM

## 2015-05-18 NOTE — Assessment & Plan Note (Signed)
She tolerated treatment cycle 1 day 1 well but missed her second week of treatment due to recent urinary tract infection.  She denies peripheral neuropathy getting worse. Her recent infection has resolved. We will resume cycle 2 today without dose adjustment

## 2015-05-18 NOTE — Telephone Encounter (Signed)
Gave and printed appt sched and avs for pt for may °

## 2015-05-18 NOTE — Assessment & Plan Note (Signed)
The patient is aware she has stage IV lung cancer and treatment is strictly palliative. She has progressed on multiple lines of treatment. We discussed importance of Advanced Directives and Living will. I will get assistance from our social worker to help her fill out some paperwork. When I questioned about her CODE STATUS, the patient desired to remain in full code. We also discussed potential referral for home base palliative care but the patient declined for now.

## 2015-05-18 NOTE — Patient Instructions (Signed)
[place] Discharge Instructions for Patients Receiving Chemotherapy  Today you received the following chemotherapy agents GEMZAR,NAVELBINE To help prevent nausea and vomiting after your treatment, we encourage you to take your nausea medication as prescribed.  If you develop nausea and vomiting that is not controlled by your nausea medication, call the clinic.   BELOW ARE SYMPTOMS THAT SHOULD BE REPORTED IMMEDIATELY:  *FEVER GREATER THAN 100.5 F  *CHILLS WITH OR WITHOUT FEVER  NAUSEA AND VOMITING THAT IS NOT CONTROLLED WITH YOUR NAUSEA MEDICATION  *UNUSUAL SHORTNESS OF BREATH  *UNUSUAL BRUISING OR BLEEDING  TENDERNESS IN MOUTH AND THROAT WITH OR WITHOUT PRESENCE OF ULCERS  *URINARY PROBLEMS  *BOWEL PROBLEMS  UNUSUAL RASH Items with * indicate a potential emergency and should be followed up as soon as possible.  Feel free to call the clinic you have any questions or concerns. The clinic phone number is [phone].  Please show the [facility] at check-in to the Emergency Department and triage nurse.

## 2015-05-18 NOTE — Assessment & Plan Note (Signed)
Neuropathy stable at grade 1. Continue same dose adjustment. I will reassess next month

## 2015-05-18 NOTE — Assessment & Plan Note (Signed)
She had exposure to radiation and background hypothyroidism. She complained of chronic fatigue. Her recent TSH is stable. We will continue on current dose of medication without dosage adjustment.

## 2015-05-18 NOTE — Patient Instructions (Signed)

## 2015-05-19 LAB — T4, FREE: T4,Free(Direct): 1.87 ng/dL — ABNORMAL HIGH (ref 0.82–1.77)

## 2015-05-23 ENCOUNTER — Other Ambulatory Visit: Payer: Self-pay | Admitting: Hematology and Oncology

## 2015-05-23 ENCOUNTER — Telehealth: Payer: Self-pay | Admitting: *Deleted

## 2015-05-23 NOTE — Telephone Encounter (Signed)
Pt reports she has had n/v since Thursday evening.  No fevers.  She has tried zofran and then compazine and ativan but not on a regular basis.  Instructed pt to alternate all three meds; zofran, compazine and ativan, taking one of them every 2 to 3 hrs as needed for any nausea.  Be aware of sedation side effects from compazine and ativan.  Offered to bring pt in for IVFs and IV anti emetics but she says she has no way to get here today.  She says she is able to drink more than 3 liters per day of fluid and doesn't feel dehydrated. She is scheduled for chemo again on Wed 4/26 ands asks if she should expect to be this nauseated every time?   Informed pt will notify Dr. Alvy Bimler and she can increase her pre meds with next treatment.  Instructed pt to call tomorrow if she doesn't feel any better. She verbalized understanding.

## 2015-05-23 NOTE — Telephone Encounter (Signed)
I suspect anticipatory nausea I will switch anti-emetics to Aloxi

## 2015-05-24 ENCOUNTER — Encounter: Payer: Self-pay | Admitting: *Deleted

## 2015-05-25 ENCOUNTER — Other Ambulatory Visit: Payer: Self-pay | Admitting: *Deleted

## 2015-05-25 ENCOUNTER — Telehealth: Payer: Self-pay | Admitting: *Deleted

## 2015-05-25 ENCOUNTER — Other Ambulatory Visit: Payer: Self-pay | Admitting: Hematology and Oncology

## 2015-05-25 ENCOUNTER — Ambulatory Visit (HOSPITAL_BASED_OUTPATIENT_CLINIC_OR_DEPARTMENT_OTHER): Payer: BLUE CROSS/BLUE SHIELD

## 2015-05-25 ENCOUNTER — Other Ambulatory Visit (HOSPITAL_BASED_OUTPATIENT_CLINIC_OR_DEPARTMENT_OTHER): Payer: BLUE CROSS/BLUE SHIELD

## 2015-05-25 ENCOUNTER — Ambulatory Visit: Payer: BLUE CROSS/BLUE SHIELD

## 2015-05-25 VITALS — BP 146/80 | HR 65 | Temp 98.4°F | Resp 17

## 2015-05-25 DIAGNOSIS — R300 Dysuria: Secondary | ICD-10-CM

## 2015-05-25 DIAGNOSIS — C3411 Malignant neoplasm of upper lobe, right bronchus or lung: Secondary | ICD-10-CM

## 2015-05-25 DIAGNOSIS — Z5111 Encounter for antineoplastic chemotherapy: Secondary | ICD-10-CM | POA: Diagnosis not present

## 2015-05-25 LAB — COMPREHENSIVE METABOLIC PANEL
ALT: 59 U/L — ABNORMAL HIGH (ref 0–55)
AST: 30 U/L (ref 5–34)
Albumin: 3.3 g/dL — ABNORMAL LOW (ref 3.5–5.0)
Alkaline Phosphatase: 68 U/L (ref 40–150)
Anion Gap: 13 mEq/L — ABNORMAL HIGH (ref 3–11)
BUN: 14.9 mg/dL (ref 7.0–26.0)
CHLORIDE: 100 meq/L (ref 98–109)
CO2: 28 meq/L (ref 22–29)
Calcium: 11.1 mg/dL — ABNORMAL HIGH (ref 8.4–10.4)
Creatinine: 1.3 mg/dL — ABNORMAL HIGH (ref 0.6–1.1)
EGFR: 45 mL/min/{1.73_m2} — AB (ref >90)
GLUCOSE: 160 mg/dL — AB (ref 70–140)
POTASSIUM: 3.3 meq/L — AB (ref 3.5–5.1)
SODIUM: 141 meq/L (ref 136–145)
Total Bilirubin: 0.38 mg/dL (ref 0.20–1.20)
Total Protein: 8 g/dL (ref 6.4–8.3)

## 2015-05-25 LAB — CBC WITH DIFFERENTIAL/PLATELET
BASO%: 0.3 % (ref 0.0–2.0)
BASOS ABS: 0 10*3/uL (ref 0.0–0.1)
EOS%: 2.1 % (ref 0.0–7.0)
Eosinophils Absolute: 0.1 10*3/uL (ref 0.0–0.5)
HCT: 36.5 % (ref 34.8–46.6)
HGB: 11.7 g/dL (ref 11.6–15.9)
LYMPH#: 0.7 10*3/uL — AB (ref 0.9–3.3)
LYMPH%: 21.5 % (ref 14.0–49.7)
MCH: 31 pg (ref 25.1–34.0)
MCHC: 32.1 g/dL (ref 31.5–36.0)
MCV: 96.6 fL (ref 79.5–101.0)
MONO#: 0.2 10*3/uL (ref 0.1–0.9)
MONO%: 5 % (ref 0.0–14.0)
NEUT#: 2.4 10*3/uL (ref 1.5–6.5)
NEUT%: 71.1 % (ref 38.4–76.8)
Platelets: 94 10*3/uL — ABNORMAL LOW (ref 145–400)
RBC: 3.78 10*6/uL (ref 3.70–5.45)
RDW: 15.3 % — AB (ref 11.2–14.5)
WBC: 3.4 10*3/uL — ABNORMAL LOW (ref 3.9–10.3)
nRBC: 0 % (ref 0–0)

## 2015-05-25 MED ORDER — HEPARIN SOD (PORK) LOCK FLUSH 100 UNIT/ML IV SOLN
500.0000 [IU] | Freq: Once | INTRAVENOUS | Status: AC | PRN
  Administered 2015-05-25: 500 [IU]
  Filled 2015-05-25: qty 5

## 2015-05-25 MED ORDER — PALONOSETRON HCL INJECTION 0.25 MG/5ML
INTRAVENOUS | Status: AC
  Filled 2015-05-25: qty 5

## 2015-05-25 MED ORDER — SODIUM CHLORIDE 0.9 % IJ SOLN
10.0000 mL | INTRAMUSCULAR | Status: DC | PRN
  Administered 2015-05-25: 10 mL via INTRAVENOUS
  Filled 2015-05-25: qty 10

## 2015-05-25 MED ORDER — PALONOSETRON HCL INJECTION 0.25 MG/5ML
0.2500 mg | Freq: Once | INTRAVENOUS | Status: AC
Start: 2015-05-25 — End: 2015-05-25
  Administered 2015-05-25: 0.25 mg via INTRAVENOUS

## 2015-05-25 MED ORDER — VINORELBINE TARTRATE CHEMO INJECTION 50 MG/5ML
20.0000 mg/m2 | Freq: Once | INTRAVENOUS | Status: AC
  Administered 2015-05-25: 48 mg via INTRAVENOUS
  Filled 2015-05-25: qty 4.8

## 2015-05-25 MED ORDER — SODIUM CHLORIDE 0.9 % IV SOLN
INTRAVENOUS | Status: DC
  Administered 2015-05-25: 13:00:00 via INTRAVENOUS

## 2015-05-25 MED ORDER — SODIUM CHLORIDE 0.9 % IV SOLN
Freq: Once | INTRAVENOUS | Status: AC
  Administered 2015-05-25: 14:00:00 via INTRAVENOUS

## 2015-05-25 MED ORDER — SODIUM CHLORIDE 0.9% FLUSH
10.0000 mL | INTRAVENOUS | Status: DC | PRN
  Administered 2015-05-25: 10 mL
  Filled 2015-05-25: qty 10

## 2015-05-25 MED ORDER — FAMOTIDINE IN NACL 20-0.9 MG/50ML-% IV SOLN
20.0000 mg | Freq: Once | INTRAVENOUS | Status: AC
  Administered 2015-05-25: 20 mg via INTRAVENOUS

## 2015-05-25 MED ORDER — FAMOTIDINE IN NACL 20-0.9 MG/50ML-% IV SOLN
INTRAVENOUS | Status: AC
  Filled 2015-05-25: qty 50

## 2015-05-25 MED ORDER — GEMCITABINE HCL CHEMO INJECTION 1 GM/26.3ML
800.0000 mg/m2 | Freq: Once | INTRAVENOUS | Status: AC
  Administered 2015-05-25: 1900 mg via INTRAVENOUS
  Filled 2015-05-25: qty 49.97

## 2015-05-25 NOTE — Patient Instructions (Signed)

## 2015-05-25 NOTE — Progress Notes (Signed)
Okay to treat using last week's CMet per Dr. Alvy Bimler.    13:00 - Pt stated "Is it normal for Aloxi to give me a headache?"  Pt reporting generalized headache of 6/10 pain.  Pt states it kind of came on suddenly.  Pt denies any other complaints at this time.  Vitals obtained and remain stable as charted.  Notified pharmacy to see if this was expected.  Pharmacy states this class of drugs can cause a headache but not usually this soon after administration.  Pt notified to let me know if headache changes or worsens or any other symptoms arise.  Pt verbalized understanding and no other concerns at this time.    13:45 - Pts calcium level found to be elevated with today's CMet.  Per Dr. Alvy Bimler, MD pt to receive 1L NS over 2 hours in addition to chemo.  Pt to return tomorrow for follow up labs and appointment with Alvy Bimler, MD.  Pt notified of plan of care and in agreement.  No further questions at this time.  14:00 - Pt complaining of heart burn.  Myrtie Neither, RN spoke with Selena Lesser, NP and verbal order given for pt to receive IV pepcid.    14:45 - Received call from [place], RN that pt to stop hydrochlorothiazide as it may be interfering with her calcium level.  Pt notified and verbalized understanding.

## 2015-05-25 NOTE — Progress Notes (Signed)
Okay to treat with today's labs per Dr. Alvy Bimler and the treatment parameters entered.

## 2015-05-25 NOTE — Patient Instructions (Signed)
[facility] Discharge Instructions for Patients Receiving Chemotherapy  Today you received the following chemotherapy agents GEMZAR,NAVELBINE To help prevent nausea and vomiting after your treatment, we encourage you to take your nausea medication as prescribed.  If you develop nausea and vomiting that is not controlled by your nausea medication, call the clinic.   BELOW ARE SYMPTOMS THAT SHOULD BE REPORTED IMMEDIATELY:  *FEVER GREATER THAN 100.5 F  *CHILLS WITH OR WITHOUT FEVER  NAUSEA AND VOMITING THAT IS NOT CONTROLLED WITH YOUR NAUSEA MEDICATION  *UNUSUAL SHORTNESS OF BREATH  *UNUSUAL BRUISING OR BLEEDING  TENDERNESS IN MOUTH AND THROAT WITH OR WITHOUT PRESENCE OF ULCERS  *URINARY PROBLEMS  *BOWEL PROBLEMS  UNUSUAL RASH Items with * indicate a potential emergency and should be followed up as soon as possible.  Feel free to call the clinic you have any questions or concerns. The clinic phone number is [phone].  Please show the [facility] at check-in to the Emergency Department and triage nurse.

## 2015-05-25 NOTE — Telephone Encounter (Signed)
Pt notified to stop Hctz for now. Will have repeat labs and see Dr Alvy Bimler on Thursday

## 2015-05-26 ENCOUNTER — Ambulatory Visit (HOSPITAL_BASED_OUTPATIENT_CLINIC_OR_DEPARTMENT_OTHER): Payer: BLUE CROSS/BLUE SHIELD | Admitting: Hematology and Oncology

## 2015-05-26 ENCOUNTER — Encounter: Payer: Self-pay | Admitting: Hematology and Oncology

## 2015-05-26 ENCOUNTER — Telehealth: Payer: Self-pay | Admitting: Hematology and Oncology

## 2015-05-26 ENCOUNTER — Other Ambulatory Visit: Payer: Self-pay | Admitting: Hematology and Oncology

## 2015-05-26 ENCOUNTER — Other Ambulatory Visit: Payer: Self-pay | Admitting: *Deleted

## 2015-05-26 ENCOUNTER — Other Ambulatory Visit (HOSPITAL_BASED_OUTPATIENT_CLINIC_OR_DEPARTMENT_OTHER): Payer: BLUE CROSS/BLUE SHIELD

## 2015-05-26 ENCOUNTER — Ambulatory Visit (HOSPITAL_BASED_OUTPATIENT_CLINIC_OR_DEPARTMENT_OTHER): Payer: BLUE CROSS/BLUE SHIELD

## 2015-05-26 ENCOUNTER — Telehealth: Payer: Self-pay | Admitting: *Deleted

## 2015-05-26 VITALS — BP 120/65 | HR 92 | Temp 98.2°F | Resp 20 | Ht 74.0 in | Wt 235.6 lb

## 2015-05-26 VITALS — BP 120/68 | HR 83 | Temp 98.4°F | Resp 18

## 2015-05-26 DIAGNOSIS — N390 Urinary tract infection, site not specified: Secondary | ICD-10-CM

## 2015-05-26 DIAGNOSIS — D61810 Antineoplastic chemotherapy induced pancytopenia: Secondary | ICD-10-CM | POA: Diagnosis not present

## 2015-05-26 DIAGNOSIS — T50905A Adverse effect of unspecified drugs, medicaments and biological substances, initial encounter: Secondary | ICD-10-CM

## 2015-05-26 DIAGNOSIS — C3411 Malignant neoplasm of upper lobe, right bronchus or lung: Secondary | ICD-10-CM | POA: Diagnosis not present

## 2015-05-26 DIAGNOSIS — R110 Nausea: Secondary | ICD-10-CM | POA: Insufficient documentation

## 2015-05-26 DIAGNOSIS — E876 Hypokalemia: Secondary | ICD-10-CM

## 2015-05-26 DIAGNOSIS — R300 Dysuria: Secondary | ICD-10-CM

## 2015-05-26 DIAGNOSIS — E860 Dehydration: Secondary | ICD-10-CM | POA: Insufficient documentation

## 2015-05-26 DIAGNOSIS — T451X5A Adverse effect of antineoplastic and immunosuppressive drugs, initial encounter: Secondary | ICD-10-CM

## 2015-05-26 HISTORY — DX: Adverse effect of unspecified drugs, medicaments and biological substances, initial encounter: T50.905A

## 2015-05-26 HISTORY — DX: Adverse effect of unspecified drugs, medicaments and biological substances, initial encounter: E87.6

## 2015-05-26 HISTORY — DX: Urinary tract infection, site not specified: N39.0

## 2015-05-26 LAB — COMPREHENSIVE METABOLIC PANEL
ALBUMIN: 3.1 g/dL — AB (ref 3.5–5.0)
ALK PHOS: 60 U/L (ref 40–150)
ALT: 43 U/L (ref 0–55)
ANION GAP: 12 meq/L — AB (ref 3–11)
AST: 29 U/L (ref 5–34)
BILIRUBIN TOTAL: 0.78 mg/dL (ref 0.20–1.20)
BUN: 15.7 mg/dL (ref 7.0–26.0)
CALCIUM: 9.4 mg/dL (ref 8.4–10.4)
CHLORIDE: 101 meq/L (ref 98–109)
CO2: 26 mEq/L (ref 22–29)
CREATININE: 1.1 mg/dL (ref 0.6–1.1)
EGFR: 52 mL/min/{1.73_m2} — ABNORMAL LOW (ref >90)
Glucose: 133 mg/dl (ref 70–140)
Potassium: 3.1 mEq/L — ABNORMAL LOW (ref 3.5–5.1)
Sodium: 138 mEq/L (ref 136–145)
TOTAL PROTEIN: 7.3 g/dL (ref 6.4–8.3)

## 2015-05-26 LAB — URINALYSIS, MICROSCOPIC - CHCC
BILIRUBIN (URINE): NEGATIVE
COMMENTS:: <5
GLUCOSE UR CHCC: NEGATIVE mg/dL
KETONES: NEGATIVE mg/dL
Nitrite: NEGATIVE
PH: 6 (ref 4.6–8.0)
PROTEIN: 300 mg/dL
RBC / HPF: NEGATIVE (ref 0–2)
SPECIFIC GRAVITY, URINE: 1.02 (ref 1.003–1.035)
Urobilinogen, UR: 0.2 mg/dL (ref 0.2–1)

## 2015-05-26 MED ORDER — AMOXICILLIN-POT CLAVULANATE 875-125 MG PO TABS: 1.0000 | ORAL_TABLET | Freq: Two times a day (BID) | ORAL | Status: DC

## 2015-05-26 MED ORDER — POTASSIUM CHLORIDE CRYS ER 20 MEQ PO TBCR: 20.0000 meq | EXTENDED_RELEASE_TABLET | Freq: Every day | ORAL | Status: DC

## 2015-05-26 MED ORDER — SODIUM CHLORIDE 0.9 % IV SOLN
INTRAVENOUS | Status: AC
  Administered 2015-05-26: 11:00:00 via INTRAVENOUS

## 2015-05-26 MED ORDER — LORAZEPAM 2 MG/ML IJ SOLN
0.5000 mg | Freq: Once | INTRAMUSCULAR | Status: AC
  Administered 2015-05-26: 0.5 mg via INTRAVENOUS

## 2015-05-26 MED ORDER — LORAZEPAM 2 MG/ML IJ SOLN
INTRAMUSCULAR | Status: AC
  Filled 2015-05-26: qty 1

## 2015-05-26 MED ORDER — PROCHLORPERAZINE MALEATE 10 MG PO TABS
10.0000 mg | ORAL_TABLET | Freq: Once | ORAL | Status: AC
  Administered 2015-05-26: 10 mg via ORAL

## 2015-05-26 MED ORDER — PROCHLORPERAZINE MALEATE 10 MG PO TABS
ORAL_TABLET | ORAL | Status: AC
  Filled 2015-05-26: qty 1

## 2015-05-26 MED ORDER — CIPROFLOXACIN HCL 250 MG PO TABS: 250.0000 mg | ORAL_TABLET | Freq: Two times a day (BID) | ORAL | Status: DC

## 2015-05-26 NOTE — Assessment & Plan Note (Signed)
I have reduced the dose of chemotherapy by 20% yesterday. We'll continue reduced dose treatment.

## 2015-05-26 NOTE — Assessment & Plan Note (Signed)
She has severe dehydration due to recent nausea. I recommend discontinuation of hydrochlorothiazide and IV fluid support Hypercalcemia has resolved with IV fluids I plan to recheck blood work again next week.

## 2015-05-26 NOTE — Progress Notes (Signed)
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   12/13/2013 Imaging CT scan show large mass in the right lung apex likely representing primary lung carcinoma or large metastasis. Metastases demonstrated in the pretracheal lymph nodes, T1 vertebral body and possibly sacrum.   12/14/2013 Initial Diagnosis Adenocarcinoma of upper lobe of right lung   12/14/2013 Pathology Results Accession: [id number] biopsy from right upper lobe showed adenocarcinoma. [facility] testing was positive for several mutations without any known treatment options now   12/17/2013 Imaging MRI of the head is negative.   12/23/2013 - 02/05/2014 Radiation Therapy She received radiation therapy to the mediastinal mass and bone   12/28/2013 Imaging PET/CT scan showed mediastinal mass, lymphadenopathy and T1 involvement.   01/01/2014 - 01/29/2014 Chemotherapy Weekly carboplatin and Taxol were added   03/12/2014 Imaging Repeat PET scan showed near complete response to treatment.   03/16/2014 - 05/28/2014 Chemotherapy She started treatment with Alimta for maintenance   07/07/2014 Imaging PET scan showed disease progression   07/15/2014 - 09/23/2014 Chemotherapy She received palliative treatment with Opdivo   08/13/2014 - 08/25/2014 Radiation Therapy She completed palliative radiation therapy to the bone.   10/05/2014 Imaging PET CT showed disease progression except for site of  recent radiation therapy   10/13/2014 - 04/06/2015 Chemotherapy She received Rx with weekly carbo/taxol, 2 weeks on 1 week off with Avastin every 3 weeks   12/15/2014 Imaging PET scan showed positive response to Rx   01/18/2015 Imaging Ct chest showed disease improvement   04/19/2015 Imaging Ct chest diease progression   04/27/2015 -  Chemotherapy Treatment is switched to Gemzar and Navelbine   05/04/2015 Adverse Reaction Treatment was missed & interrupted due to recurrent UTI    INTERVAL HISTORY: Please see below for problem oriented charting. She is seen urgently because she has not been feeling well. She is weak with poor oral intake and nauseated. She also has symptoms of dysuria. Denies urinary frequency, urgency or hematuria. Has poor oral intake  REVIEW OF SYSTEMS:   Constitutional: Denies fevers, chills or abnormal weight loss Eyes: Denies blurriness of vision Ears, nose, mouth, throat, and face: Denies mucositis or sore throat Respiratory: Denies cough, dyspnea or wheezes Cardiovascular: Denies palpitation, chest discomfort or lower extremity swelling Skin: Denies abnormal skin rashes Lymphatics: Denies new lymphadenopathy or easy bruising Behavioral/Psych: Mood is stable, no new changes  All other systems were reviewed with the patient and are negative.  I have reviewed the past medical history, past surgical history, social history and family history with the patient and they are unchanged from previous note.  ALLERGIES:  is allergic to codeine; penicillins; and sulfa antibiotics.  MEDICATIONS:  Current Outpatient Prescriptions  Medication Sig Dispense Refill  . albuterol (PROVENTIL HFA;VENTOLIN HFA) 108 (90 BASE) MCG/ACT inhaler Inhale 2 puffs into the lungs every 4 (four) hours as needed for wheezing or shortness of breath. (Patient not  taking: Reported on 04/20/2015) 1 Inhaler 5  . amLODipine (NORVASC) 10 MG tablet Take 1 tablet (10 mg total) by mouth daily. 90 tablet 0  .  ciprofloxacin (CIPRO) 250 MG tablet Take 1 tablet (250 mg total) by mouth 2 (two) times daily. 14 tablet 0  . fluticasone (FLONASE) 50 MCG/ACT nasal spray Place 2 sprays into both nostrils daily. 16 g 5  . Fluticasone Furoate-Vilanterol (BREO ELLIPTA) 200-25 MCG/INH AEPB Inhale 1 puff into the lungs daily. Reported on 04/20/2015    . hydrochlorothiazide (HYDRODIURIL) 25 MG tablet TAKE 1 TABLET BY MOUTH EVERY DAY 30 tablet 1  . levofloxacin (LEVAQUIN) 500 MG tablet Take 1 tablet (500 mg total) by mouth daily. 7 tablet 0  . levothyroxine (SYNTHROID, LEVOTHROID) 200 MCG tablet TAKE 1 TABLET BY MOUTH DAILY BEFORE BREAKFAST. 90 tablet 0  . lidocaine-prilocaine (EMLA) cream Apply 1 application topically as needed. 90 g 0  . LORazepam (ATIVAN) 0.5 MG tablet TAKE 1 TABLET BY MOUTH EVERY 8 HOURS AS NEEDED FOR ANXIETY OR FOR NAUSEA (Patient not taking: Reported on 04/20/2015) 90 tablet 0  . magic mouthwash w/lidocaine SOLN Take 5 mLs by mouth 4 (four) times daily. Dukes Formula with Nystatin and Lidocaine 1:1 (Patient not taking: Reported on 04/20/2015) 300 mL 0  . mirtazapine (REMERON) 15 MG tablet TAKE 1 TABLET (15 MG TOTAL) BY MOUTH AT BEDTIME AS NEEDED (FOR SLEEP.). 90 tablet 6  . ondansetron (ZOFRAN) 8 MG tablet TAKE 1 TABLET BY MOUTH EVERY 6 HOURS AS NEEDED FOR NAUSEA. 90 tablet 0  . potassium chloride SA (K-DUR,KLOR-CON) 20 MEQ tablet Take 1 tablet (20 mEq total) by mouth daily. 30 tablet 0  . ranitidine (ZANTAC) 150 MG tablet Take 1 tablet (150 mg total) by mouth every evening. 90 tablet 2  . sucralfate (CARAFATE) 1 G tablet Take 1 g by mouth 4 (four) times daily. Reported on 01/12/2015  0   No current facility-administered medications for this visit.    PHYSICAL EXAMINATION: ECOG PERFORMANCE STATUS: 1 - Symptomatic but completely ambulatory  Filed Vitals:   05/26/15 1036  BP: 120/65  Pulse: 92  Temp: 98.2 F (36.8 C)  Resp: 20   Filed Weights   05/26/15 1036  Weight: 235 lb 9.6 oz (106.867  kg)    GENERAL:alert, no distress and comfortable SKIN: skin color, texture, turgor are normal, no rashes or significant lesions EYES: normal, Conjunctiva are pink and non-injected, sclera clear Musculoskeletal:no cyanosis of digits and no clubbing  NEURO: alert & oriented x 3 with fluent speech, no focal motor/sensory deficits  LABORATORY DATA:  I have reviewed the data as listed    Component Value Date/Time   NA 138 05/26/2015 1023   NA 138 06/24/2014 1422   K 3.1* 05/26/2015 1023   K 4.0 06/24/2014 1422   CL 96* 06/24/2014 1422   CO2 26 05/26/2015 1023   CO2 30 06/24/2014 1422   GLUCOSE 133 05/26/2015 1023   GLUCOSE 151* 06/24/2014 1422   BUN 15.7 05/26/2015 1023   BUN 13 06/24/2014 1422   CREATININE 1.1 05/26/2015 1023   CREATININE 1.54* 06/24/2014 1422   CALCIUM 9.4 05/26/2015 1023   CALCIUM 9.1 06/24/2014 1422   PROT 7.3 05/26/2015 1023   PROT 7.5 12/13/2013 2018   ALBUMIN 3.1* 05/26/2015 1023   ALBUMIN 3.7 12/13/2013 2018   AST 29 05/26/2015 1023   AST 17 12/13/2013 2018   ALT 43 05/26/2015 1023   ALT 14 12/13/2013 2018   ALKPHOS 60 05/26/2015 1023  ALKPHOS 80 12/13/2013 2018   BILITOT 0.78 05/26/2015 1023   BILITOT 0.3 12/13/2013 2018   GFRNONAA 36* 06/24/2014 1422   GFRAA 41* 06/24/2014 1422    No results found for: SPEP, UPEP  Lab Results  Component Value Date   WBC 3.4* 05/25/2015   NEUTROABS 2.4 05/25/2015   HGB 11.7 05/25/2015   HCT 36.5 05/25/2015   MCV 96.6 05/25/2015   PLT 94* 05/25/2015      Chemistry      Component Value Date/Time   NA 138 05/26/2015 1023   NA 138 06/24/2014 1422   K 3.1* 05/26/2015 1023   K 4.0 06/24/2014 1422   CL 96* 06/24/2014 1422   CO2 26 05/26/2015 1023   CO2 30 06/24/2014 1422   BUN 15.7 05/26/2015 1023   BUN 13 06/24/2014 1422   CREATININE 1.1 05/26/2015 1023   CREATININE 1.54* 06/24/2014 1422      Component Value Date/Time   CALCIUM 9.4 05/26/2015 1023   CALCIUM 9.1 06/24/2014 1422   ALKPHOS 60  05/26/2015 1023   ALKPHOS 80 12/13/2013 2018   AST 29 05/26/2015 1023   AST 17 12/13/2013 2018   ALT 43 05/26/2015 1023   ALT 14 12/13/2013 2018   BILITOT 0.78 05/26/2015 1023   BILITOT 0.3 12/13/2013 2018      ASSESSMENT & PLAN:   Adenocarcinoma of upper lobe of right lung Overall, she tolerated treatment very poorly. Her recent acute hypercalcemia was medication related I will continue treatment as scheduled but will add IV fluids for hydration  Dehydration She has severe dehydration due to recent nausea. I recommend discontinuation of hydrochlorothiazide and IV fluid support Hypercalcemia has resolved with IV fluids I plan to recheck blood work again next week.  UTI (lower urinary tract infection) She has recurrence of urinary tract infection due to dehydration and nausea. I will prescribe another course of oral antibiotics for her  Drug-induced hypokalemia The hypokalemia is related to recent medications and poor oral intake. I suspect she may not be able to tolerate oral potassium supplement and recommend diet rich with potassium. Plan to recheck next week.  Chemotherapy-induced nausea I have changed her recent premedication to Aloxi. I will bring her back for frequent IV fluid support.  Pancytopenia due to antineoplastic chemotherapy ([facility]) I have reduced the dose of chemotherapy by 20% yesterday. We'll continue reduced dose treatment.     All questions were answered. The patient knows to call the clinic with any problems, questions or concerns. No barriers to learning was detected. I spent 20 minutes counseling the patient face to face. The total time spent in the appointment was 25 minutes and more than 50% was on counseling and review of test results     [facility], [place], MD 05/26/2015 1:38 PM

## 2015-05-26 NOTE — Assessment & Plan Note (Addendum)
Overall, she tolerated treatment very poorly. Her recent acute hypercalcemia was medication related I will continue treatment as scheduled but will add IV fluids for hydration

## 2015-05-26 NOTE — Assessment & Plan Note (Signed)
I have changed her recent premedication to Aloxi. I will bring her back for frequent IV fluid support.

## 2015-05-26 NOTE — Assessment & Plan Note (Signed)
The hypokalemia is related to recent medications and poor oral intake. I suspect she may not be able to tolerate oral potassium supplement and recommend diet rich with potassium. Plan to recheck next week.

## 2015-05-26 NOTE — Assessment & Plan Note (Signed)
She has recurrence of urinary tract infection due to dehydration and nausea. I will prescribe another course of oral antibiotics for her

## 2015-05-26 NOTE — Patient Instructions (Signed)

## 2015-05-26 NOTE — Telephone Encounter (Signed)
Per staff message and POF I have scheduled appts. Advised scheduler of appts. JMW  

## 2015-05-26 NOTE — Telephone Encounter (Signed)
per pof to sch pt appt-sent MW email to [facility] trmt-pt to get updated copy of avs b4 leavin

## 2015-05-27 ENCOUNTER — Ambulatory Visit (HOSPITAL_BASED_OUTPATIENT_CLINIC_OR_DEPARTMENT_OTHER): Payer: BLUE CROSS/BLUE SHIELD

## 2015-05-27 ENCOUNTER — Other Ambulatory Visit: Payer: Self-pay | Admitting: *Deleted

## 2015-05-27 VITALS — BP 123/64 | HR 75 | Temp 98.1°F | Resp 17

## 2015-05-27 DIAGNOSIS — R1013 Epigastric pain: Secondary | ICD-10-CM | POA: Diagnosis not present

## 2015-05-27 DIAGNOSIS — C3411 Malignant neoplasm of upper lobe, right bronchus or lung: Secondary | ICD-10-CM

## 2015-05-27 MED ORDER — HEPARIN SOD (PORK) LOCK FLUSH 100 UNIT/ML IV SOLN
500.0000 [IU] | Freq: Once | INTRAVENOUS | Status: AC | PRN
  Administered 2015-05-27: 500 [IU] via INTRAVENOUS
  Filled 2015-05-27: qty 5

## 2015-05-27 MED ORDER — FAMOTIDINE IN NACL 20-0.9 MG/50ML-% IV SOLN
INTRAVENOUS | Status: AC
  Filled 2015-05-27: qty 50

## 2015-05-27 MED ORDER — SODIUM CHLORIDE 0.9 % IV SOLN
Freq: Once | INTRAVENOUS | Status: AC
  Administered 2015-05-27: 10:00:00 via INTRAVENOUS

## 2015-05-27 MED ORDER — FAMOTIDINE IN NACL 20-0.9 MG/50ML-% IV SOLN
20.0000 mg | Freq: Two times a day (BID) | INTRAVENOUS | Status: DC
  Administered 2015-05-27: 20 mg via INTRAVENOUS

## 2015-05-27 MED ORDER — HYDROMORPHONE HCL 4 MG/ML IJ SOLN
INTRAMUSCULAR | Status: AC
  Filled 2015-05-27: qty 1

## 2015-05-27 MED ORDER — PROMETHAZINE HCL 25 MG/ML IJ SOLN
25.0000 mg | Freq: Once | INTRAMUSCULAR | Status: AC
  Administered 2015-05-27: 25 mg via INTRAVENOUS
  Filled 2015-05-27: qty 1

## 2015-05-27 MED ORDER — HEPARIN SOD (PORK) LOCK FLUSH 100 UNIT/ML IV SOLN
500.0000 [IU] | Freq: Once | INTRAVENOUS | Status: DC | PRN
Start: 2015-05-27 — End: 2015-05-27
  Filled 2015-05-27: qty 5

## 2015-05-27 MED ORDER — HYDROMORPHONE HCL 4 MG/ML IJ SOLN
1.0000 mg | Freq: Once | INTRAMUSCULAR | Status: AC
  Administered 2015-05-27: 1 mg via INTRAVENOUS

## 2015-05-27 NOTE — Progress Notes (Signed)
Pt c/o epigastric pain 7/10 burning type pain not relieved w/ phenergan or pepcid.  Notified Dr. Alvy Bimler and gave Dilaudid 1 mg IV as ordered.

## 2015-05-27 NOTE — Patient Instructions (Signed)

## 2015-05-28 ENCOUNTER — Ambulatory Visit (HOSPITAL_BASED_OUTPATIENT_CLINIC_OR_DEPARTMENT_OTHER): Payer: BLUE CROSS/BLUE SHIELD

## 2015-05-28 VITALS — BP 94/78 | HR 91 | Temp 99.0°F | Resp 18

## 2015-05-28 DIAGNOSIS — C3411 Malignant neoplasm of upper lobe, right bronchus or lung: Secondary | ICD-10-CM

## 2015-05-28 LAB — URINE CULTURE

## 2015-05-28 MED ORDER — PROMETHAZINE HCL 25 MG/ML IJ SOLN
25.0000 mg | Freq: Once | INTRAMUSCULAR | Status: AC
  Administered 2015-05-28: 25 mg via INTRAVENOUS

## 2015-05-28 MED ORDER — SODIUM CHLORIDE 0.9 % IV SOLN
Freq: Once | INTRAVENOUS | Status: AC
  Administered 2015-05-28: 09:00:00 via INTRAVENOUS

## 2015-05-28 MED ORDER — SODIUM CHLORIDE 0.9 % IJ SOLN
10.0000 mL | INTRAMUSCULAR | Status: DC | PRN
  Administered 2015-05-28: 10 mL
  Filled 2015-05-28: qty 10

## 2015-05-28 MED ORDER — HEPARIN SOD (PORK) LOCK FLUSH 100 UNIT/ML IV SOLN
500.0000 [IU] | Freq: Once | INTRAVENOUS | Status: AC | PRN
  Administered 2015-05-28: 500 [IU]
  Filled 2015-05-28: qty 5

## 2015-05-28 NOTE — Progress Notes (Signed)
Patient was nauseated with some emesis towards the end of her liter of fluid. Phenergan administered. Patient would benefit from a nutritional consult.

## 2015-05-30 ENCOUNTER — Ambulatory Visit (HOSPITAL_BASED_OUTPATIENT_CLINIC_OR_DEPARTMENT_OTHER): Payer: BLUE CROSS/BLUE SHIELD

## 2015-05-30 ENCOUNTER — Ambulatory Visit: Payer: BLUE CROSS/BLUE SHIELD

## 2015-05-30 ENCOUNTER — Telehealth: Payer: Self-pay | Admitting: *Deleted

## 2015-05-30 ENCOUNTER — Other Ambulatory Visit (HOSPITAL_BASED_OUTPATIENT_CLINIC_OR_DEPARTMENT_OTHER): Payer: BLUE CROSS/BLUE SHIELD

## 2015-05-30 VITALS — BP 124/62 | HR 79 | Temp 98.5°F | Resp 16

## 2015-05-30 DIAGNOSIS — R110 Nausea: Secondary | ICD-10-CM | POA: Diagnosis not present

## 2015-05-30 DIAGNOSIS — C3411 Malignant neoplasm of upper lobe, right bronchus or lung: Secondary | ICD-10-CM

## 2015-05-30 LAB — CBC WITH DIFFERENTIAL/PLATELET
BASO%: 0.4 % (ref 0.0–2.0)
Basophils Absolute: 0 10*3/uL (ref 0.0–0.1)
EOS%: 6.2 % (ref 0.0–7.0)
Eosinophils Absolute: 0.1 10*3/uL (ref 0.0–0.5)
HEMATOCRIT: 31.2 % — AB (ref 34.8–46.6)
HEMOGLOBIN: 10 g/dL — AB (ref 11.6–15.9)
LYMPH#: 0.3 10*3/uL — AB (ref 0.9–3.3)
LYMPH%: 23.9 % (ref 14.0–49.7)
MCH: 30.7 pg (ref 25.1–34.0)
MCHC: 32.1 g/dL (ref 31.5–36.0)
MCV: 95.8 fL (ref 79.5–101.0)
MONO#: 0 10*3/uL — AB (ref 0.1–0.9)
MONO%: 2.5 % (ref 0.0–14.0)
NEUT%: 67 % (ref 38.4–76.8)
NEUTROS ABS: 0.9 10*3/uL — AB (ref 1.5–6.5)
Platelets: 58 10*3/uL — ABNORMAL LOW (ref 145–400)
RBC: 3.25 10*6/uL — ABNORMAL LOW (ref 3.70–5.45)
RDW: 16.9 % — AB (ref 11.2–14.5)
WBC: 1.4 10*3/uL — ABNORMAL LOW (ref 3.9–10.3)

## 2015-05-30 LAB — COMPREHENSIVE METABOLIC PANEL
ALBUMIN: 3.1 g/dL — AB (ref 3.5–5.0)
ALT: 63 U/L — AB (ref 0–55)
AST: 51 U/L — AB (ref 5–34)
Alkaline Phosphatase: 60 U/L (ref 40–150)
Anion Gap: 9 mEq/L (ref 3–11)
BILIRUBIN TOTAL: 0.59 mg/dL (ref 0.20–1.20)
BUN: 8.7 mg/dL (ref 7.0–26.0)
CALCIUM: 9 mg/dL (ref 8.4–10.4)
CHLORIDE: 101 meq/L (ref 98–109)
CO2: 28 mEq/L (ref 22–29)
CREATININE: 1.2 mg/dL — AB (ref 0.6–1.1)
EGFR: 48 mL/min/{1.73_m2} — ABNORMAL LOW (ref >90)
Glucose: 128 mg/dl (ref 70–140)
Potassium: 3.1 mEq/L — ABNORMAL LOW (ref 3.5–5.1)
Sodium: 138 mEq/L (ref 136–145)
TOTAL PROTEIN: 7.5 g/dL (ref 6.4–8.3)

## 2015-05-30 MED ORDER — PROMETHAZINE HCL 25 MG PO TABS
ORAL_TABLET | ORAL | Status: AC
  Filled 2015-05-30: qty 1

## 2015-05-30 MED ORDER — PROMETHAZINE HCL 25 MG PO TABS
25.0000 mg | ORAL_TABLET | Freq: Once | ORAL | Status: AC
  Administered 2015-05-30: 25 mg via ORAL

## 2015-05-30 MED ORDER — PROMETHAZINE HCL 25 MG/ML IJ SOLN
25.0000 mg | Freq: Once | INTRAMUSCULAR | Status: DC
  Filled 2015-05-30: qty 1

## 2015-05-30 MED ORDER — HEPARIN SOD (PORK) LOCK FLUSH 100 UNIT/ML IV SOLN
500.0000 [IU] | Freq: Once | INTRAVENOUS | Status: DC | PRN
  Filled 2015-05-30: qty 5

## 2015-05-30 MED ORDER — SODIUM CHLORIDE 0.9 % IJ SOLN
10.0000 mL | INTRAMUSCULAR | Status: DC | PRN
  Filled 2015-05-30: qty 10

## 2015-05-30 MED ORDER — HEPARIN SOD (PORK) LOCK FLUSH 100 UNIT/ML IV SOLN
500.0000 [IU] | Freq: Once | INTRAVENOUS | Status: AC | PRN
  Administered 2015-05-30: 500 [IU]
  Filled 2015-05-30: qty 5

## 2015-05-30 MED ORDER — SODIUM CHLORIDE 0.9 % IJ SOLN
10.0000 mL | INTRAMUSCULAR | Status: DC | PRN
  Administered 2015-05-30: 10 mL via INTRAVENOUS
  Filled 2015-05-30: qty 10

## 2015-05-30 MED ORDER — ALTEPLASE 2 MG IJ SOLR
2.0000 mg | Freq: Once | INTRAMUSCULAR | Status: DC | PRN
  Filled 2015-05-30: qty 2

## 2015-05-30 MED ORDER — SODIUM CHLORIDE 0.9 % IV SOLN
Freq: Once | INTRAVENOUS | Status: AC
  Administered 2015-05-30: 15:00:00 via INTRAVENOUS
  Filled 2015-05-30: qty 4

## 2015-05-30 MED ORDER — SODIUM CHLORIDE 0.9 % IJ SOLN
10.0000 mL | INTRAMUSCULAR | Status: DC | PRN
  Administered 2015-05-30: 10 mL
  Filled 2015-05-30: qty 10

## 2015-05-30 MED ORDER — SODIUM CHLORIDE 0.9 % IV SOLN
Freq: Once | INTRAVENOUS | Status: AC
  Administered 2015-05-30: 15:00:00 via INTRAVENOUS

## 2015-05-30 NOTE — Patient Instructions (Signed)

## 2015-05-30 NOTE — Telephone Encounter (Signed)
LVM for pt to see how she is doing per Dr Alvy Bimler.   Her urine culture is positive and sensitive to Cipro.  Pt scheduled for IVFs today at 2:30 pm.  LVM for pt she does not need to call back if she plans on keeping IVF appt today.  We can see how she is doing at that time.   Or she can call back and ask for Dr. Calton Dach nurse.

## 2015-05-30 NOTE — Progress Notes (Signed)
Dr. Alvy Bimler notified of pt's ongoing n/v and epigastric burning pain.  She instructs pt can take Maalox at home as directed on label for indigestion.  Continue home anti emetics as ordered.  Labs reviewed and Dr. Alvy Bimler says they are stable.  Schedule pt for IVFs the rest of this week for ongoing n/v.   Pt verbalized understanding.  She is rotating her anti emetics Zofran, Compazine and Ativan.  She will add Maalox as directed for heartburn.  She will continue her Zantac as directed.

## 2015-05-31 ENCOUNTER — Ambulatory Visit: Payer: BLUE CROSS/BLUE SHIELD

## 2015-06-01 ENCOUNTER — Ambulatory Visit: Payer: BLUE CROSS/BLUE SHIELD

## 2015-06-02 ENCOUNTER — Ambulatory Visit (HOSPITAL_BASED_OUTPATIENT_CLINIC_OR_DEPARTMENT_OTHER): Payer: BLUE CROSS/BLUE SHIELD

## 2015-06-02 ENCOUNTER — Encounter: Payer: Self-pay | Admitting: *Deleted

## 2015-06-02 VITALS — BP 127/64 | HR 73 | Temp 98.5°F | Resp 18

## 2015-06-02 DIAGNOSIS — C3411 Malignant neoplasm of upper lobe, right bronchus or lung: Secondary | ICD-10-CM | POA: Diagnosis not present

## 2015-06-02 MED ORDER — SODIUM CHLORIDE 0.9 % IJ SOLN
10.0000 mL | INTRAMUSCULAR | Status: DC | PRN
  Administered 2015-06-02: 10 mL
  Filled 2015-06-02: qty 10

## 2015-06-02 MED ORDER — HEPARIN SOD (PORK) LOCK FLUSH 100 UNIT/ML IV SOLN
500.0000 [IU] | Freq: Once | INTRAVENOUS | Status: AC | PRN
  Administered 2015-06-02: 500 [IU]
  Filled 2015-06-02: qty 5

## 2015-06-02 MED ORDER — PROMETHAZINE HCL 25 MG PO TABS
ORAL_TABLET | ORAL | Status: AC
  Filled 2015-06-02: qty 1

## 2015-06-02 MED ORDER — PROMETHAZINE HCL 25 MG/ML IJ SOLN: 25.0000 mg | Freq: Once | INTRAMUSCULAR | Status: DC

## 2015-06-02 MED ORDER — PROMETHAZINE HCL 25 MG PO TABS
25.0000 mg | ORAL_TABLET | Freq: Once | ORAL | Status: AC
  Administered 2015-06-02: 25 mg via ORAL

## 2015-06-02 MED ORDER — SODIUM CHLORIDE 0.9 % IV SOLN
Freq: Once | INTRAVENOUS | Status: AC
  Administered 2015-06-02: 14:00:00 via INTRAVENOUS

## 2015-06-02 NOTE — Progress Notes (Signed)
Patient reports phenergan is more effective at relieving nausea than zofran, and requesting this today.  States she is not driving herself home.  1600: Patient reports nausea has improved with po phenergan. Reports having a BM while here today.  Pt's PAC left accessed for fluids tomorrow 06/03/15 per pt request.

## 2015-06-02 NOTE — Progress Notes (Unsigned)
Patient has a dental appt Tuesday and a procedure is planned. Does not know what kind. Wants to know if she needs to do anything before appt?

## 2015-06-02 NOTE — Patient Instructions (Signed)

## 2015-06-03 ENCOUNTER — Ambulatory Visit (HOSPITAL_BASED_OUTPATIENT_CLINIC_OR_DEPARTMENT_OTHER): Payer: BLUE CROSS/BLUE SHIELD

## 2015-06-03 ENCOUNTER — Telehealth: Payer: Self-pay | Admitting: *Deleted

## 2015-06-03 ENCOUNTER — Other Ambulatory Visit: Payer: Self-pay | Admitting: *Deleted

## 2015-06-03 VITALS — BP 139/67 | HR 67 | Temp 98.8°F | Resp 18

## 2015-06-03 DIAGNOSIS — C3411 Malignant neoplasm of upper lobe, right bronchus or lung: Secondary | ICD-10-CM

## 2015-06-03 MED ORDER — HEPARIN SOD (PORK) LOCK FLUSH 100 UNIT/ML IV SOLN
500.0000 [IU] | Freq: Once | INTRAVENOUS | Status: AC | PRN
  Administered 2015-06-03: 500 [IU]
  Filled 2015-06-03: qty 5

## 2015-06-03 MED ORDER — PROMETHAZINE HCL 25 MG PO TABS
25.0000 mg | ORAL_TABLET | Freq: Once | ORAL | Status: AC
  Administered 2015-06-03: 25 mg via ORAL

## 2015-06-03 MED ORDER — PROMETHAZINE HCL 25 MG/ML IJ SOLN: 25.0000 mg | Freq: Once | INTRAMUSCULAR | Status: DC

## 2015-06-03 MED ORDER — SODIUM CHLORIDE 0.9 % IJ SOLN
10.0000 mL | INTRAMUSCULAR | Status: DC | PRN
  Administered 2015-06-03: 10 mL
  Filled 2015-06-03: qty 10

## 2015-06-03 MED ORDER — SODIUM CHLORIDE 0.9 % IV SOLN
Freq: Once | INTRAVENOUS | Status: AC
  Administered 2015-06-03: 14:00:00 via INTRAVENOUS

## 2015-06-03 MED ORDER — ACETAMINOPHEN 325 MG PO TABS
ORAL_TABLET | ORAL | Status: AC
  Filled 2015-06-03: qty 2

## 2015-06-03 MED ORDER — ACETAMINOPHEN 325 MG PO TABS
650.0000 mg | ORAL_TABLET | Freq: Once | ORAL | Status: AC
  Administered 2015-06-03: 650 mg via ORAL

## 2015-06-03 MED ORDER — PROMETHAZINE HCL 25 MG PO TABS
ORAL_TABLET | ORAL | Status: AC
  Filled 2015-06-03: qty 1

## 2015-06-03 NOTE — Patient Instructions (Signed)

## 2015-06-03 NOTE — Telephone Encounter (Signed)
Left message for patient regarding dental visit on Tuesday. If having cleaning- ok, if extraction will need dentist to prescribe antibiotic.

## 2015-06-03 NOTE — Progress Notes (Signed)
Pt requesting 2 Tylenol for headache and IVf's tomorrow, 06/04/15.  OK per Ellery Plunk RN per Dr. Alvy Bimler.

## 2015-06-04 ENCOUNTER — Ambulatory Visit (HOSPITAL_BASED_OUTPATIENT_CLINIC_OR_DEPARTMENT_OTHER): Payer: BLUE CROSS/BLUE SHIELD

## 2015-06-04 VITALS — BP 135/71 | HR 72 | Temp 98.6°F | Resp 16

## 2015-06-04 DIAGNOSIS — C3411 Malignant neoplasm of upper lobe, right bronchus or lung: Secondary | ICD-10-CM | POA: Diagnosis not present

## 2015-06-04 MED ORDER — SODIUM CHLORIDE 0.9 % IJ SOLN
10.0000 mL | INTRAMUSCULAR | Status: DC | PRN
  Administered 2015-06-04: 10 mL
  Filled 2015-06-04: qty 10

## 2015-06-04 MED ORDER — HEPARIN SOD (PORK) LOCK FLUSH 100 UNIT/ML IV SOLN
500.0000 [IU] | Freq: Once | INTRAVENOUS | Status: AC | PRN
  Administered 2015-06-04: 500 [IU]
  Filled 2015-06-04: qty 5

## 2015-06-04 MED ORDER — PROMETHAZINE HCL 25 MG/ML IJ SOLN
25.0000 mg | Freq: Once | INTRAMUSCULAR | Status: AC
  Administered 2015-06-04: 25 mg via INTRAVENOUS

## 2015-06-04 MED ORDER — SODIUM CHLORIDE 0.9 % IV SOLN
Freq: Once | INTRAVENOUS | Status: AC
  Administered 2015-06-04: 09:00:00 via INTRAVENOUS

## 2015-06-04 NOTE — Patient Instructions (Signed)

## 2015-06-06 ENCOUNTER — Telehealth: Payer: Self-pay | Admitting: *Deleted

## 2015-06-06 NOTE — Telephone Encounter (Signed)
Pt called to check her schedule and make sure she is seeing Dr. Alvy Bimler this week.  She wants to discuss how to prevent the severe nausea and vomiting she feels she had with her last treatment. She says she "can't go through that again" and "was ready to give up."   Assured pt she has appt to see Dr. Alvy Bimler on Wed prior to treatment and can discuss with her on this appointment how to prevent n/v w/ next treatment.   Discussed with patient that it is unusual to have such severe n/v with this treatment, but Dr. Alvy Bimler may be able to order more anti nausea meds that can be given to help prevent it on next treatment.    Pt verbalized understanding.  She reports n/v has resolved now she just feels "very very weak" but is able to eat some small meals and keep fluids down.

## 2015-06-08 ENCOUNTER — Telehealth: Payer: Self-pay | Admitting: Hematology and Oncology

## 2015-06-08 ENCOUNTER — Encounter: Payer: Self-pay | Admitting: Hematology and Oncology

## 2015-06-08 ENCOUNTER — Other Ambulatory Visit (HOSPITAL_BASED_OUTPATIENT_CLINIC_OR_DEPARTMENT_OTHER): Payer: BLUE CROSS/BLUE SHIELD

## 2015-06-08 ENCOUNTER — Ambulatory Visit (HOSPITAL_BASED_OUTPATIENT_CLINIC_OR_DEPARTMENT_OTHER): Payer: BLUE CROSS/BLUE SHIELD | Admitting: Hematology and Oncology

## 2015-06-08 ENCOUNTER — Ambulatory Visit: Payer: BLUE CROSS/BLUE SHIELD

## 2015-06-08 VITALS — BP 142/61 | HR 77 | Temp 98.0°F | Resp 19 | Ht 74.0 in | Wt 236.6 lb

## 2015-06-08 DIAGNOSIS — R609 Edema, unspecified: Secondary | ICD-10-CM | POA: Diagnosis not present

## 2015-06-08 DIAGNOSIS — C3411 Malignant neoplasm of upper lobe, right bronchus or lung: Secondary | ICD-10-CM

## 2015-06-08 DIAGNOSIS — R51 Headache: Secondary | ICD-10-CM

## 2015-06-08 DIAGNOSIS — D61810 Antineoplastic chemotherapy induced pancytopenia: Secondary | ICD-10-CM

## 2015-06-08 DIAGNOSIS — R112 Nausea with vomiting, unspecified: Secondary | ICD-10-CM | POA: Diagnosis not present

## 2015-06-08 DIAGNOSIS — E46 Unspecified protein-calorie malnutrition: Secondary | ICD-10-CM

## 2015-06-08 DIAGNOSIS — R519 Headache, unspecified: Secondary | ICD-10-CM

## 2015-06-08 DIAGNOSIS — R600 Localized edema: Secondary | ICD-10-CM

## 2015-06-08 DIAGNOSIS — T451X5A Adverse effect of antineoplastic and immunosuppressive drugs, initial encounter: Secondary | ICD-10-CM

## 2015-06-08 HISTORY — DX: Headache, unspecified: R51.9

## 2015-06-08 LAB — COMPREHENSIVE METABOLIC PANEL
ALT: 25 U/L (ref 0–55)
AST: 24 U/L (ref 5–34)
Albumin: 3.3 g/dL — ABNORMAL LOW (ref 3.5–5.0)
Alkaline Phosphatase: 90 U/L (ref 40–150)
Anion Gap: 10 mEq/L (ref 3–11)
BUN: 8.7 mg/dL (ref 7.0–26.0)
CHLORIDE: 105 meq/L (ref 98–109)
CO2: 25 mEq/L (ref 22–29)
Calcium: 9.4 mg/dL (ref 8.4–10.4)
Creatinine: 0.9 mg/dL (ref 0.6–1.1)
EGFR: 67 mL/min/{1.73_m2} — AB (ref >90)
GLUCOSE: 136 mg/dL (ref 70–140)
POTASSIUM: 4.1 meq/L (ref 3.5–5.1)
SODIUM: 141 meq/L (ref 136–145)
Total Bilirubin: 0.33 mg/dL (ref 0.20–1.20)
Total Protein: 7.5 g/dL (ref 6.4–8.3)

## 2015-06-08 LAB — CBC WITH DIFFERENTIAL/PLATELET
BASO%: 1.4 % (ref 0.0–2.0)
BASOS ABS: 0 10*3/uL (ref 0.0–0.1)
EOS%: 2.8 % (ref 0.0–7.0)
Eosinophils Absolute: 0.1 10*3/uL (ref 0.0–0.5)
HCT: 35.4 % (ref 34.8–46.6)
HGB: 11 g/dL — ABNORMAL LOW (ref 11.6–15.9)
LYMPH%: 33 % (ref 14.0–49.7)
MCH: 31 pg (ref 25.1–34.0)
MCHC: 31.1 g/dL — AB (ref 31.5–36.0)
MCV: 99.7 fL (ref 79.5–101.0)
MONO#: 0.7 10*3/uL (ref 0.1–0.9)
MONO%: 30.2 % — AB (ref 0.0–14.0)
NEUT#: 0.7 10*3/uL — ABNORMAL LOW (ref 1.5–6.5)
NEUT%: 32.6 % — AB (ref 38.4–76.8)
Platelets: 316 10*3/uL (ref 145–400)
RBC: 3.55 10*6/uL — ABNORMAL LOW (ref 3.70–5.45)
RDW: 17.9 % — AB (ref 11.2–14.5)
WBC: 2.2 10*3/uL — ABNORMAL LOW (ref 3.9–10.3)
lymph#: 0.7 10*3/uL — ABNORMAL LOW (ref 0.9–3.3)

## 2015-06-08 MED ORDER — HEPARIN SOD (PORK) LOCK FLUSH 100 UNIT/ML IV SOLN
500.0000 [IU] | Freq: Once | INTRAVENOUS | Status: AC | PRN
  Administered 2015-06-08: 500 [IU] via INTRAVENOUS
  Filled 2015-06-08: qty 5

## 2015-06-08 MED ORDER — SODIUM CHLORIDE 0.9 % IJ SOLN
10.0000 mL | INTRAMUSCULAR | Status: DC | PRN
  Administered 2015-06-08: 10 mL via INTRAVENOUS
  Filled 2015-06-08: qty 10

## 2015-06-08 NOTE — Assessment & Plan Note (Signed)
She had mild protein calorie malnutrition due to side effects of treatment and recent urinary tract infection. Serum albumin is improving. I encouraged her to increase oral intake as tolerated.

## 2015-06-08 NOTE — Progress Notes (Signed)
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   12/13/2013 Imaging CT scan show large mass in the right lung apex likely representing primary lung carcinoma or large metastasis. Metastases demonstrated in the pretracheal lymph nodes, T1 vertebral body and possibly sacrum.   12/14/2013 Initial Diagnosis Adenocarcinoma of upper lobe of right lung   12/14/2013 Pathology Results Accession: [id number] biopsy from right upper lobe showed adenocarcinoma. [facility] testing was positive for several mutations without any known treatment options now   12/17/2013 Imaging MRI of the head is negative.   12/23/2013 - 02/05/2014 Radiation Therapy She received radiation therapy to the mediastinal mass and bone   12/28/2013 Imaging PET/CT scan showed mediastinal mass, lymphadenopathy and T1 involvement.   01/01/2014 - 01/29/2014 Chemotherapy Weekly carboplatin and Taxol were added   03/12/2014 Imaging Repeat PET scan showed near complete response to treatment.   03/16/2014 - 05/28/2014 Chemotherapy She started treatment with Alimta for maintenance   07/07/2014 Imaging PET scan showed disease progression   07/15/2014 - 09/23/2014 Chemotherapy She received palliative treatment with Opdivo   08/13/2014 - 08/25/2014 Radiation Therapy She completed palliative radiation therapy to the bone.   10/05/2014 Imaging PET CT showed disease progression except for site of  recent radiation therapy   10/13/2014 - 04/06/2015 Chemotherapy She received Rx with weekly carbo/taxol, 2 weeks on 1 week off with Avastin every 3 weeks   12/15/2014 Imaging PET scan showed positive response to Rx   01/18/2015 Imaging Ct chest showed disease improvement   04/19/2015 Imaging Ct chest diease progression   04/27/2015 -  Chemotherapy Treatment is switched to Gemzar and Navelbine   05/04/2015 Adverse Reaction Treatment was missed & interrupted due to recurrent UTI    INTERVAL HISTORY: Please see below for problem oriented charting. She returns today for further chemotherapy. She barely got over her recent episode of severe nausea and vomiting. She complained of weakness and occasional dizziness. Denies new neurological deficit. Denies recurrence of urinary tract infection. No recent dysuria, frequency or urgency. She felt weak overall She complained of bilateral lower extremity edema.  REVIEW OF SYSTEMS:   Constitutional: Denies fevers, chills or abnormal weight loss Eyes: Denies blurriness of vision Ears, nose, mouth, throat, and face: Denies mucositis or sore throat Respiratory: Denies cough, dyspnea or wheezes Cardiovascular: Denies palpitation, chest discomfort  Skin: Denies abnormal skin rashes Lymphatics: Denies new lymphadenopathy or easy bruising Behavioral/Psych: Mood is stable, no new changes  All other systems were reviewed with the patient and are negative.  I have reviewed the past medical history, past surgical history, social history and family history with the patient and they are unchanged from previous note.  ALLERGIES:  is allergic to codeine; penicillins; and sulfa antibiotics.  MEDICATIONS:  Current Outpatient Prescriptions  Medication Sig Dispense Refill  . albuterol (PROVENTIL HFA;VENTOLIN HFA) 108 (90 BASE) MCG/ACT inhaler Inhale 2 puffs into the lungs every  4 (four) hours as needed for wheezing or shortness of breath. 1 Inhaler 5  . amLODipine  (NORVASC) 10 MG tablet Take 1 tablet (10 mg total) by mouth daily. 90 tablet 0  . fluticasone (FLONASE) 50 MCG/ACT nasal spray Place 2 sprays into both nostrils daily. 16 g 5  . Fluticasone Furoate-Vilanterol (BREO ELLIPTA) 200-25 MCG/INH AEPB Inhale 1 puff into the lungs daily. Reported on 04/20/2015    . levothyroxine (SYNTHROID, LEVOTHROID) 200 MCG tablet TAKE 1 TABLET BY MOUTH DAILY BEFORE BREAKFAST. 90 tablet 0  . lidocaine-prilocaine (EMLA) cream Apply 1 application topically as needed. 90 g 0  . LORazepam (ATIVAN) 0.5 MG tablet TAKE 1 TABLET BY MOUTH EVERY 8 HOURS AS NEEDED FOR ANXIETY OR FOR NAUSEA 90 tablet 0  . magic mouthwash w/lidocaine SOLN Take 5 mLs by mouth 4 (four) times daily. Dukes Formula with Nystatin and Lidocaine 1:1 300 mL 0  . mirtazapine (REMERON) 15 MG tablet TAKE 1 TABLET (15 MG TOTAL) BY MOUTH AT BEDTIME AS NEEDED (FOR SLEEP.). 90 tablet 6  . ondansetron (ZOFRAN) 8 MG tablet TAKE 1 TABLET BY MOUTH EVERY 6 HOURS AS NEEDED FOR NAUSEA. 90 tablet 0  . potassium chloride SA (K-DUR,KLOR-CON) 20 MEQ tablet Take 1 tablet (20 mEq total) by mouth daily. 30 tablet 0  . ranitidine (ZANTAC) 150 MG tablet Take 1 tablet (150 mg total) by mouth every evening. 90 tablet 2  . sucralfate (CARAFATE) 1 G tablet Take 1 g by mouth 4 (four) times daily. Reported on 01/12/2015  0   No current facility-administered medications for this visit.    PHYSICAL EXAMINATION: ECOG PERFORMANCE STATUS: 1 - Symptomatic but completely ambulatory  Filed Vitals:   06/08/15 1006  BP: 142/61  Pulse: 77  Temp: 98 F (36.7 C)  Resp: 19   Filed Weights   06/08/15 1006  Weight: 236 lb 9.6 oz (107.321 kg)    GENERAL:alert, no distress and comfortable SKIN: skin color, texture, turgor are normal, no rashes or significant lesions EYES: normal, Conjunctiva are pink and non-injected, sclera clear OROPHARYNX:no exudate, no erythema and lips, buccal mucosa, and tongue normal  NECK: supple, thyroid normal  size, non-tender, without nodularity LYMPH:  no palpable lymphadenopathy in the cervical, axillary or inguinal LUNGS: clear to auscultation and percussion with normal breathing effort HEART: regular rate & rhythm and no murmurs with bilateral lower extremity edema ABDOMEN:abdomen soft, non-tender and normal bowel sounds Musculoskeletal:no cyanosis of digits and no clubbing  NEURO: alert & oriented x 3 with fluent speech, no focal motor/sensory deficits  LABORATORY DATA:  I have reviewed the data as listed    Component Value Date/Time   NA 141 06/08/2015 0939   NA 138 06/24/2014 1422   K 4.1 06/08/2015 0939   K 4.0 06/24/2014 1422   CL 96* 06/24/2014 1422   CO2 25 06/08/2015 0939   CO2 30 06/24/2014 1422   GLUCOSE 136 06/08/2015 0939   GLUCOSE 151* 06/24/2014 1422   BUN 8.7 06/08/2015 0939   BUN 13 06/24/2014 1422   CREATININE 0.9 06/08/2015 0939   CREATININE 1.54* 06/24/2014 1422   CALCIUM 9.4 06/08/2015 0939   CALCIUM 9.1 06/24/2014 1422   PROT 7.5 06/08/2015 0939   PROT 7.5 12/13/2013 2018   ALBUMIN 3.3* 06/08/2015 0939   ALBUMIN 3.7 12/13/2013 2018   AST 24 06/08/2015 0939   AST 17 12/13/2013 2018   ALT 25 06/08/2015 0939   ALT 14 12/13/2013 2018   ALKPHOS 90 06/08/2015 0939  ALKPHOS 80 12/13/2013 2018   BILITOT 0.33 06/08/2015 0939   BILITOT 0.3 12/13/2013 2018   GFRNONAA 36* 06/24/2014 1422   GFRAA 41* 06/24/2014 1422    No results found for: SPEP, UPEP  Lab Results  Component Value Date   WBC 2.2* 06/08/2015   NEUTROABS 0.7* 06/08/2015   HGB 11.0* 06/08/2015   HCT 35.4 06/08/2015   MCV 99.7 06/08/2015   PLT 316 06/08/2015      Chemistry      Component Value Date/Time   NA 141 06/08/2015 0939   NA 138 06/24/2014 1422   K 4.1 06/08/2015 0939   K 4.0 06/24/2014 1422   CL 96* 06/24/2014 1422   CO2 25 06/08/2015 0939   CO2 30 06/24/2014 1422   BUN 8.7 06/08/2015 0939   BUN 13 06/24/2014 1422   CREATININE 0.9 06/08/2015 0939   CREATININE 1.54*  06/24/2014 1422      Component Value Date/Time   CALCIUM 9.4 06/08/2015 0939   CALCIUM 9.1 06/24/2014 1422   ALKPHOS 90 06/08/2015 0939   ALKPHOS 80 12/13/2013 2018   AST 24 06/08/2015 0939   AST 17 12/13/2013 2018   ALT 25 06/08/2015 0939   ALT 14 12/13/2013 2018   BILITOT 0.33 06/08/2015 0939   BILITOT 0.3 12/13/2013 2018      ASSESSMENT & PLAN:  Adenocarcinoma of upper lobe of right lung The patient has significant side effects and poor tolerance to treatment. I am concerned about potential undiagnosed brain metastasis. I will hold treatment today and order urgent MRI next week. If MRI show evidence of brain metastasis, we will discontinue chemotherapy and consult radiation oncologist for palliative radiation treatment I will see her back next week to review test results  Pancytopenia due to antineoplastic chemotherapy [facility]) She has significant pancytopenia from recent treatment. I will hold treatment today. Plan to further reduce the dose of chemotherapy next week and to add G-CSF to future treatment plan  Nausea with vomiting Surprisingly, she has poorly controlled nausea and vomiting. She just got over a bout of severe nausea vomiting after last cycle of treatment. I will hold treatment today and reassess next week as outlined above. In the meantime, she will continue to take anti-emetics as needed  Protein calorie malnutrition ([place]) She had mild protein calorie malnutrition due to side effects of treatment and recent urinary tract infection. Serum albumin is improving. I encouraged her to increase oral intake as tolerated.  Bilateral leg edema She has chronic bilateral lower extremity edema due to fluid retention. I discontinued hydrochlorothiazide recently due to hypercalcemia and dehydration. I do not recommend regular intake of diuretic therapy due to tendency of causing severe dehydration in her situation   Orders Placed This Encounter  Procedures  . MR  Brain W Contrast    Standing Status: Future     Number of Occurrences:      Standing Expiration Date: 08/07/2016    Order Specific Question:  Reason for Exam (SYMPTOM  OR DIAGNOSIS REQUIRED)    Answer:  lung cancer, headache, nausea, exclude brain mets    Order Specific Question:  Preferred imaging location?    Answer:  [facility] (table limit-350 lbs)    Order Specific Question:  Does the patient have a pacemaker or implanted devices?    Answer:  No    Order Specific Question:  What is the patient's sedation requirement?    Answer:  No Sedation   All questions were answered. The patient knows  to call the clinic with any problems, questions or concerns. No barriers to learning was detected. I spent 20 minutes counseling the patient face to face. The total time spent in the appointment was 30 minutes and more than 50% was on counseling and review of test results     [facility], [place], MD 06/08/2015 3:17 PM

## 2015-06-08 NOTE — Assessment & Plan Note (Signed)
She has significant pancytopenia from recent treatment. I will hold treatment today. Plan to further reduce the dose of chemotherapy next week and to add G-CSF to future treatment plan

## 2015-06-08 NOTE — Telephone Encounter (Signed)
Gave pt apt & avs °

## 2015-06-08 NOTE — Assessment & Plan Note (Signed)
She has chronic bilateral lower extremity edema due to fluid retention. I discontinued hydrochlorothiazide recently due to hypercalcemia and dehydration. I do not recommend regular intake of diuretic therapy due to tendency of causing severe dehydration in her situation

## 2015-06-08 NOTE — Assessment & Plan Note (Signed)
Surprisingly, she has poorly controlled nausea and vomiting. She just got over a bout of severe nausea vomiting after last cycle of treatment. I will hold treatment today and reassess next week as outlined above. In the meantime, she will continue to take anti-emetics as needed

## 2015-06-08 NOTE — Assessment & Plan Note (Signed)
The patient has significant side effects and poor tolerance to treatment. I am concerned about potential undiagnosed brain metastasis. I will hold treatment today and order urgent MRI next week. If MRI show evidence of brain metastasis, we will discontinue chemotherapy and consult radiation oncologist for palliative radiation treatment I will see her back next week to review test results

## 2015-06-08 NOTE — Patient Instructions (Signed)

## 2015-06-10 ENCOUNTER — Telehealth: Payer: Self-pay | Admitting: *Deleted

## 2015-06-10 ENCOUNTER — Other Ambulatory Visit: Payer: Self-pay | Admitting: Hematology and Oncology

## 2015-06-10 DIAGNOSIS — C3411 Malignant neoplasm of upper lobe, right bronchus or lung: Secondary | ICD-10-CM

## 2015-06-10 DIAGNOSIS — R112 Nausea with vomiting, unspecified: Secondary | ICD-10-CM

## 2015-06-10 NOTE — Telephone Encounter (Signed)
MRI brain scheduled for 5 pm on Monday 5/15.   Notified pt to arrive at 4:45 pm to MRI dept next to [facility].  Dr. Alvy Bimler will review results w/ her on Wed morning as scheduled.  Pt verbalized understanding.

## 2015-06-13 ENCOUNTER — Ambulatory Visit (HOSPITAL_COMMUNITY): Admission: RE | Admit: 2015-06-13 | Payer: BLUE CROSS/BLUE SHIELD | Source: Ambulatory Visit

## 2015-06-13 ENCOUNTER — Ambulatory Visit (HOSPITAL_COMMUNITY)
Admission: RE | Admit: 2015-06-13 | Discharge: 2015-06-13 | Disposition: A | Discharge status: Home / Self Care | Payer: BLUE CROSS/BLUE SHIELD | Source: Ambulatory Visit | Attending: Hematology and Oncology | Admitting: Hematology and Oncology

## 2015-06-13 ENCOUNTER — Other Ambulatory Visit: Payer: Self-pay | Admitting: Hematology and Oncology

## 2015-06-13 DIAGNOSIS — C3411 Malignant neoplasm of upper lobe, right bronchus or lung: Secondary | ICD-10-CM | POA: Insufficient documentation

## 2015-06-13 DIAGNOSIS — R112 Nausea with vomiting, unspecified: Secondary | ICD-10-CM | POA: Insufficient documentation

## 2015-06-13 MED ORDER — GADOBENATE DIMEGLUMINE 529 MG/ML IV SOLN
20.0000 mL | Freq: Once | INTRAVENOUS | Status: AC | PRN
  Administered 2015-06-13: 20 mL via INTRAVENOUS

## 2015-06-14 ENCOUNTER — Other Ambulatory Visit: Payer: Self-pay | Admitting: Hematology and Oncology

## 2015-06-15 ENCOUNTER — Other Ambulatory Visit (HOSPITAL_BASED_OUTPATIENT_CLINIC_OR_DEPARTMENT_OTHER): Payer: BLUE CROSS/BLUE SHIELD

## 2015-06-15 ENCOUNTER — Ambulatory Visit (HOSPITAL_BASED_OUTPATIENT_CLINIC_OR_DEPARTMENT_OTHER): Payer: BLUE CROSS/BLUE SHIELD | Admitting: Hematology and Oncology

## 2015-06-15 ENCOUNTER — Ambulatory Visit: Payer: BLUE CROSS/BLUE SHIELD

## 2015-06-15 ENCOUNTER — Other Ambulatory Visit: Payer: Self-pay | Admitting: Hematology and Oncology

## 2015-06-15 ENCOUNTER — Ambulatory Visit: Payer: BLUE CROSS/BLUE SHIELD | Admitting: Nutrition

## 2015-06-15 ENCOUNTER — Ambulatory Visit (HOSPITAL_BASED_OUTPATIENT_CLINIC_OR_DEPARTMENT_OTHER): Payer: BLUE CROSS/BLUE SHIELD

## 2015-06-15 ENCOUNTER — Telehealth: Payer: Self-pay | Admitting: Hematology and Oncology

## 2015-06-15 ENCOUNTER — Telehealth: Payer: Self-pay | Admitting: *Deleted

## 2015-06-15 ENCOUNTER — Other Ambulatory Visit: Payer: BLUE CROSS/BLUE SHIELD

## 2015-06-15 VITALS — BP 126/66 | HR 86 | Temp 98.3°F | Resp 18 | Ht 74.0 in | Wt 233.1 lb

## 2015-06-15 DIAGNOSIS — R600 Localized edema: Secondary | ICD-10-CM | POA: Diagnosis not present

## 2015-06-15 DIAGNOSIS — C3411 Malignant neoplasm of upper lobe, right bronchus or lung: Secondary | ICD-10-CM

## 2015-06-15 DIAGNOSIS — Z5111 Encounter for antineoplastic chemotherapy: Secondary | ICD-10-CM | POA: Diagnosis not present

## 2015-06-15 LAB — CBC WITH DIFFERENTIAL/PLATELET
BASO%: 1.2 % (ref 0.0–2.0)
BASOS ABS: 0.1 10*3/uL (ref 0.0–0.1)
EOS%: 0.9 % (ref 0.0–7.0)
Eosinophils Absolute: 0.1 10*3/uL (ref 0.0–0.5)
HCT: 37.9 % (ref 34.8–46.6)
HGB: 11.9 g/dL (ref 11.6–15.9)
LYMPH%: 15.8 % (ref 14.0–49.7)
MCH: 30.7 pg (ref 25.1–34.0)
MCHC: 31.4 g/dL — ABNORMAL LOW (ref 31.5–36.0)
MCV: 97.8 fL (ref 79.5–101.0)
MONO#: 1.1 10*3/uL — ABNORMAL HIGH (ref 0.1–0.9)
MONO%: 15.3 % — AB (ref 0.0–14.0)
NEUT#: 4.7 10*3/uL (ref 1.5–6.5)
NEUT%: 66.8 % (ref 38.4–76.8)
Platelets: 320 10*3/uL (ref 145–400)
RBC: 3.88 10*6/uL (ref 3.70–5.45)
RDW: 17.8 % — ABNORMAL HIGH (ref 11.2–14.5)
WBC: 7 10*3/uL (ref 3.9–10.3)
lymph#: 1.1 10*3/uL (ref 0.9–3.3)

## 2015-06-15 LAB — COMPREHENSIVE METABOLIC PANEL
ALBUMIN: 3.5 g/dL (ref 3.5–5.0)
ALK PHOS: 81 U/L (ref 40–150)
ALT: 12 U/L (ref 0–55)
AST: 15 U/L (ref 5–34)
Anion Gap: 9 mEq/L (ref 3–11)
BILIRUBIN TOTAL: 0.34 mg/dL (ref 0.20–1.20)
BUN: 15.4 mg/dL (ref 7.0–26.0)
CO2: 26 mEq/L (ref 22–29)
Calcium: 9.6 mg/dL (ref 8.4–10.4)
Chloride: 106 mEq/L (ref 98–109)
Creatinine: 1 mg/dL (ref 0.6–1.1)
EGFR: 59 mL/min/{1.73_m2} — ABNORMAL LOW (ref >90)
GLUCOSE: 153 mg/dL — AB (ref 70–140)
Potassium: 3.8 mEq/L (ref 3.5–5.1)
SODIUM: 141 meq/L (ref 136–145)
TOTAL PROTEIN: 8.2 g/dL (ref 6.4–8.3)

## 2015-06-15 MED ORDER — SODIUM CHLORIDE 0.9 % IV SOLN
Freq: Once | INTRAVENOUS | Status: AC
  Administered 2015-06-15: 11:00:00 via INTRAVENOUS
  Filled 2015-06-15: qty 5

## 2015-06-15 MED ORDER — PALONOSETRON HCL INJECTION 0.25 MG/5ML
INTRAVENOUS | Status: AC
  Filled 2015-06-15: qty 5

## 2015-06-15 MED ORDER — VINORELBINE TARTRATE CHEMO INJECTION 50 MG/5ML
17.5000 mg/m2 | Freq: Once | INTRAVENOUS | Status: AC
  Administered 2015-06-15: 42 mg via INTRAVENOUS
  Filled 2015-06-15: qty 4.2

## 2015-06-15 MED ORDER — PALONOSETRON HCL INJECTION 0.25 MG/5ML
0.2500 mg | Freq: Once | INTRAVENOUS | Status: AC
  Administered 2015-06-15: 0.25 mg via INTRAVENOUS

## 2015-06-15 MED ORDER — GEMCITABINE HCL CHEMO INJECTION 1 GM/26.3ML
700.0000 mg/m2 | Freq: Once | INTRAVENOUS | Status: AC
  Administered 2015-06-15: 1672 mg via INTRAVENOUS
  Filled 2015-06-15: qty 43.97

## 2015-06-15 MED ORDER — SODIUM CHLORIDE 0.9 % IJ SOLN
10.0000 mL | INTRAMUSCULAR | Status: DC | PRN
  Administered 2015-06-15: 10 mL via INTRAVENOUS
  Filled 2015-06-15: qty 10

## 2015-06-15 MED ORDER — SODIUM CHLORIDE 0.9 % IV SOLN
INTRAVENOUS | Status: DC
  Administered 2015-06-15: 11:00:00 via INTRAVENOUS

## 2015-06-15 MED ORDER — SODIUM CHLORIDE 0.9% FLUSH
10.0000 mL | INTRAVENOUS | Status: DC | PRN
  Administered 2015-06-15: 10 mL
  Filled 2015-06-15: qty 10

## 2015-06-15 MED ORDER — HEPARIN SOD (PORK) LOCK FLUSH 100 UNIT/ML IV SOLN
500.0000 [IU] | Freq: Once | INTRAVENOUS | Status: AC | PRN
  Administered 2015-06-15: 500 [IU]
  Filled 2015-06-15: qty 5

## 2015-06-15 NOTE — Assessment & Plan Note (Addendum)
The patient has significant side effects and poor tolerance to treatment. MRI showed no evidence of disease that could be causing nausea. She is fully recovered from recent side effects. I will proceed to reduce 30% of chemotherapy on both agents, add on stronger anti-emetics and also add G-CSF support after day 8 treatment. She agreed with the plan of care. I recommend restaging after cycle 4 of treatment

## 2015-06-15 NOTE — Progress Notes (Signed)
60 year old female diagnosed with lung cancer.  She is a patient of Dr. Alvy Bimler.  PMH includes thyroid cancer and radiation.  Medications include Synthroid, Ativan, Remeron, Zofran, and Carafate.  Labs include Albumin 3.3.  Height: 6 feet 2 inches. Weight: 233.1 pounds. Usual body weight: 230-240 pounds. BMI: 29.92. (overweight)  Patient reports poorly controlled nausea and vomiting along with diarrhea, during last treatment. She reports bilateral leg edema. Appetite is fair.  Nutrition diagnosis:  Food and nutrition related knowledge deficit related to lung cancer and associated treatments as evidenced by no prior need for nutrition related information  Intervention:  Patient was educated to consume high-calorie, high-protein foods in small frequent meals and snacks. Educated patient on foods to avoid during nausea, vomiting, diarrhea. Encouraged patient to consume increased protein 6 times a day to help with edema. Fact sheets were provided. Contact information was given. Questions were answered.  Teach back method was used.  Monitoring, evaluation, goals: Patient will tolerate adequate calories and protein to minimize weight loss throughout treatment.  Next visit:Wednesday, June 7, during infusion.  **Disclaimer: This note was dictated with voice recognition software. Similar sounding words can inadvertently be transcribed and this note may contain transcription errors which may not have been corrected upon publication of note.**

## 2015-06-15 NOTE — Progress Notes (Signed)
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   12/13/2013 Imaging CT scan show large mass in the right lung apex likely representing primary lung carcinoma or large metastasis. Metastases demonstrated in the pretracheal lymph nodes, T1 vertebral body and possibly sacrum.   12/14/2013 Initial Diagnosis Adenocarcinoma of upper lobe of right lung   12/14/2013 Pathology Results Accession: [id number] biopsy from right upper lobe showed adenocarcinoma. [facility] testing was positive for several mutations without any known treatment options now   12/17/2013 Imaging MRI of the head is negative.   12/23/2013 - 02/05/2014 Radiation Therapy She received radiation therapy to the mediastinal mass and bone   12/28/2013 Imaging PET/CT scan showed mediastinal mass, lymphadenopathy and T1 involvement.   01/01/2014 - 01/29/2014 Chemotherapy Weekly carboplatin and Taxol were added   03/12/2014 Imaging Repeat PET scan showed near complete response to treatment.   03/16/2014 - 05/28/2014 Chemotherapy She started treatment with Alimta for maintenance   07/07/2014 Imaging PET scan showed disease progression   07/15/2014 - 09/23/2014 Chemotherapy She received palliative treatment with Opdivo   08/13/2014 - 08/25/2014 Radiation Therapy She completed palliative radiation therapy to the bone.   10/05/2014 Imaging PET CT showed disease progression except for site of  recent radiation therapy   10/13/2014 - 04/06/2015 Chemotherapy She received Rx with weekly carbo/taxol, 2 weeks on 1 week off with Avastin every 3 weeks   12/15/2014 Imaging PET scan showed positive response to Rx   01/18/2015 Imaging Ct chest showed disease improvement   04/19/2015 Imaging Ct chest diease progression   04/27/2015 -  Chemotherapy Treatment is switched to Gemzar and Navelbine   05/04/2015 Adverse Reaction Treatment was missed & interrupted due to recurrent UTI   06/13/2015 Imaging MRI head is negative    INTERVAL HISTORY: Please see below for problem oriented charting. She returns to review test results. Overall, her energy level, appetite had improved and nausea had resolved. She complained of bilateral leg edema but improved compared to previous. Denies dysuria, frequency or urgency.  REVIEW OF SYSTEMS:   Constitutional: Denies fevers, chills or abnormal weight loss Eyes: Denies blurriness of vision Ears, nose, mouth, throat, and face: Denies mucositis or sore throat Respiratory: Denies cough, dyspnea or wheezes Cardiovascular: Denies palpitation, chest discomfort  Gastrointestinal:  Denies nausea, heartburn or change in bowel habits Skin: Denies abnormal skin rashes Lymphatics: Denies new lymphadenopathy or easy bruising Neurological:Denies numbness, tingling or new weaknesses Behavioral/Psych: Mood is stable, no new changes  All other systems were reviewed with the patient and are negative.  I have reviewed the past medical history, past surgical history, social history and family history with the patient and they are unchanged from previous note.  ALLERGIES:  is allergic to codeine; penicillins; and sulfa antibiotics.  MEDICATIONS:  Current Outpatient Prescriptions  Medication Sig Dispense Refill  . prochlorperazine (COMPAZINE) 10 MG tablet Take 10 mg by mouth every  6 (six) hours as needed.    Marland Kitchen albuterol (PROVENTIL HFA;VENTOLIN HFA) 108 (90 BASE) MCG/ACT inhaler  Inhale 2 puffs into the lungs every 4 (four) hours as needed for wheezing or shortness of breath. 1 Inhaler 5  . amLODipine (NORVASC) 10 MG tablet Take 1 tablet (10 mg total) by mouth daily. 90 tablet 0  . fluticasone (FLONASE) 50 MCG/ACT nasal spray Place 2 sprays into both nostrils daily. 16 g 5  . Fluticasone Furoate-Vilanterol (BREO ELLIPTA) 200-25 MCG/INH AEPB Inhale 1 puff into the lungs daily. Reported on 04/20/2015    . levothyroxine (SYNTHROID, LEVOTHROID) 200 MCG tablet TAKE 1 TABLET BY MOUTH DAILY BEFORE BREAKFAST. 90 tablet 0  . lidocaine-prilocaine (EMLA) cream Apply 1 application topically as needed. 90 g 0  . LORazepam (ATIVAN) 0.5 MG tablet TAKE 1 TABLET BY MOUTH EVERY 8 HOURS AS NEEDED FOR ANXIETY OR FOR NAUSEA 90 tablet 0  . magic mouthwash w/lidocaine SOLN Take 5 mLs by mouth 4 (four) times daily. Dukes Formula with Nystatin and Lidocaine 1:1 300 mL 0  . mirtazapine (REMERON) 15 MG tablet TAKE 1 TABLET (15 MG TOTAL) BY MOUTH AT BEDTIME AS NEEDED (FOR SLEEP.). 90 tablet 6  . ondansetron (ZOFRAN) 8 MG tablet TAKE 1 TABLET BY MOUTH EVERY 6 HOURS AS NEEDED FOR NAUSEA. 90 tablet 0  . potassium chloride SA (K-DUR,KLOR-CON) 20 MEQ tablet Take 1 tablet (20 mEq total) by mouth daily. 30 tablet 0  . ranitidine (ZANTAC) 150 MG tablet Take 1 tablet (150 mg total) by mouth every evening. 90 tablet 2  . sucralfate (CARAFATE) 1 G tablet Take 1 g by mouth 4 (four) times daily. Reported on 01/12/2015  0   No current facility-administered medications for this visit.   Facility-Administered Medications Ordered in Other Visits  Medication Dose Route Frequency Provider Last Rate Last Dose  . 0.9 %  sodium chloride infusion   Intravenous Continuous [name], MD 20 mL/hr at 06/15/15 1040    . gemcitabine (GEMZAR) 1,672 mg in sodium chloride 0.9 % 100 mL chemo infusion  700 mg/m2 (Treatment Plan Actual) Intravenous Once [name], MD      . heparin lock flush 100 unit/mL  500 Units Intracatheter  Once PRN [name], MD      . sodium chloride flush (NS) 0.9 % injection 10 mL  10 mL Intracatheter PRN [name], MD      . vinorelbine (NAVELBINE) 42 mg in sodium chloride 0.9 % 50 mL chemo infusion  17.5 mg/m2 (Treatment Plan Actual) Intravenous Once [name], MD        PHYSICAL EXAMINATION: ECOG PERFORMANCE STATUS: 1 - Symptomatic but completely ambulatory  Filed Vitals:   06/15/15 0947  BP: 126/66  Pulse: 86  Temp: 98.3 F (36.8 C)  Resp: 18   Filed Weights   06/15/15 0947  Weight: 233 lb 1.6 oz (105.733 kg)    GENERAL:alert, no distress and comfortable. She is obese SKIN: skin color, texture, turgor are normal, no rashes or significant lesions EYES: normal, Conjunctiva are pink and non-injected, sclera clear OROPHARYNX:no exudate, no erythema and lips, buccal mucosa, and tongue normal  NECK: supple, thyroid normal size, non-tender, without nodularity LYMPH:  no palpable lymphadenopathy in the cervical, axillary or inguinal LUNGS: clear to auscultation and percussion with normal breathing effort HEART: regular rate & rhythm and no murmurs with bilateral lower extremity edema ABDOMEN:abdomen soft, non-tender and normal bowel sounds Musculoskeletal:no cyanosis of digits and no clubbing  NEURO: alert & oriented x  3 with fluent speech, no focal motor/sensory deficits  LABORATORY DATA:  I have reviewed the data as listed    Component Value Date/Time   NA 141 06/15/2015 0926   NA 138 06/24/2014 1422   K 3.8 06/15/2015 0926   K 4.0 06/24/2014 1422   CL 96* 06/24/2014 1422   CO2 26 06/15/2015 0926   CO2 30 06/24/2014 1422   GLUCOSE 153* 06/15/2015 0926   GLUCOSE 151* 06/24/2014 1422   BUN 15.4 06/15/2015 0926   BUN 13 06/24/2014 1422   CREATININE 1.0 06/15/2015 0926   CREATININE 1.54* 06/24/2014 1422   CALCIUM 9.6 06/15/2015 0926   CALCIUM 9.1 06/24/2014 1422   PROT 8.2 06/15/2015 0926   PROT 7.5 12/13/2013 2018   ALBUMIN 3.5 06/15/2015 0926   ALBUMIN 3.7  12/13/2013 2018   AST 15 06/15/2015 0926   AST 17 12/13/2013 2018   ALT 12 06/15/2015 0926   ALT 14 12/13/2013 2018   ALKPHOS 81 06/15/2015 0926   ALKPHOS 80 12/13/2013 2018   BILITOT 0.34 06/15/2015 0926   BILITOT 0.3 12/13/2013 2018   GFRNONAA 36* 06/24/2014 1422   GFRAA 41* 06/24/2014 1422    No results found for: SPEP, UPEP  Lab Results  Component Value Date   WBC 7.0 06/15/2015   NEUTROABS 4.7 06/15/2015   HGB 11.9 06/15/2015   HCT 37.9 06/15/2015   MCV 97.8 06/15/2015   PLT 320 06/15/2015      Chemistry      Component Value Date/Time   NA 141 06/15/2015 0926   NA 138 06/24/2014 1422   K 3.8 06/15/2015 0926   K 4.0 06/24/2014 1422   CL 96* 06/24/2014 1422   CO2 26 06/15/2015 0926   CO2 30 06/24/2014 1422   BUN 15.4 06/15/2015 0926   BUN 13 06/24/2014 1422   CREATININE 1.0 06/15/2015 0926   CREATININE 1.54* 06/24/2014 1422      Component Value Date/Time   CALCIUM 9.6 06/15/2015 0926   CALCIUM 9.1 06/24/2014 1422   ALKPHOS 81 06/15/2015 0926   ALKPHOS 80 12/13/2013 2018   AST 15 06/15/2015 0926   AST 17 12/13/2013 2018   ALT 12 06/15/2015 0926   ALT 14 12/13/2013 2018   BILITOT 0.34 06/15/2015 0926   BILITOT 0.3 12/13/2013 2018       RADIOGRAPHIC STUDIES: I have personally reviewed the radiological images as listed and agreed with the findings in the report. Mr Jeri Cos Wo Contrast  06/14/2015  CLINICAL DATA:  Nausea and headache.  Right upper lobe lung cancer. EXAM: MRI HEAD WITHOUT AND WITH CONTRAST TECHNIQUE: Multiplanar, multiecho pulse sequences of the brain and surrounding structures were obtained without and with intravenous contrast. CONTRAST:  39m MULTIHANCE GADOBENATE DIMEGLUMINE 529 MG/ML IV SOLN COMPARISON:  07/07/2014 FINDINGS: There is no evidence of acute infarct, intracranial hemorrhage, mass, midline shift, or extra-axial fluid collection. The ventricles and sulci are normal. The brain is normal in signal. No abnormal enhancement is  identified. Prior bilateral cataract extraction is noted. Paranasal sinuses and mastoid air cells are clear. Major intracranial vascular flow voids are preserved. No skull lesion is identified. IMPRESSION: Unremarkable brain MRI. Electronically Signed   By: ALogan BoresM.D.   On: 06/14/2015 07:31     ASSESSMENT & PLAN:  Adenocarcinoma of upper lobe of right lung The patient has significant side effects and poor tolerance to treatment. MRI showed no evidence of disease that could be causing nausea. She is fully recovered from recent side  effects. I will proceed to reduce 30% of chemotherapy on both agents, add on stronger anti-emetics and also add G-CSF support after day 8 treatment. She agreed with the plan of care. I recommend restaging after cycle 4 of treatment  Bilateral leg edema She has chronic bilateral lower extremity edema due to fluid retention. I discontinued hydrochlorothiazide recently due to hypercalcemia and dehydration. I do not recommend regular intake of diuretic therapy due to tendency of causing severe dehydration in her situation I recommend consideration of discontinuation of amlodipine that is also known to cause leg edema and she agreed to try. She will continue close monitoring of her blood pressure at home.   No orders of the defined types were placed in this encounter.   All questions were answered. The patient knows to call the clinic with any problems, questions or concerns. No barriers to learning was detected. I spent 15 minutes counseling the patient face to face. The total time spent in the appointment was 20 minutes and more than 50% was on counseling and review of test results     [facility], [place], MD 06/15/2015 11:28 AM

## 2015-06-15 NOTE — Patient Instructions (Signed)

## 2015-06-15 NOTE — Telephone Encounter (Signed)
Per staff message and POF I have scheduled appts. Advised scheduler of appts. JMW  

## 2015-06-15 NOTE — Patient Instructions (Signed)
[facility] Discharge Instructions for Patients Receiving Chemotherapy  Today you received the following chemotherapy agents Navelibine and Gemzar. To help prevent nausea and vomiting after your treatment, we encourage you to take your nausea medication as directed - NO ZOFRAN for 3 days .  If you develop nausea and vomiting that is not controlled by your nausea medication, call the clinic.   BELOW ARE SYMPTOMS THAT SHOULD BE REPORTED IMMEDIATELY:  *FEVER GREATER THAN 100.5 F  *CHILLS WITH OR WITHOUT FEVER  NAUSEA AND VOMITING THAT IS NOT CONTROLLED WITH YOUR NAUSEA MEDICATION  *UNUSUAL SHORTNESS OF BREATH  *UNUSUAL BRUISING OR BLEEDING  TENDERNESS IN MOUTH AND THROAT WITH OR WITHOUT PRESENCE OF ULCERS  *URINARY PROBLEMS  *BOWEL PROBLEMS  UNUSUAL RASH Items with * indicate a potential emergency and should be followed up as soon as possible.  Feel free to call the clinic you have any questions or concerns. The clinic phone number is [phone].  Please show the [facility] at check-in to the Emergency Department and triage nurse.

## 2015-06-15 NOTE — Assessment & Plan Note (Signed)
She has chronic bilateral lower extremity edema due to fluid retention. I discontinued hydrochlorothiazide recently due to hypercalcemia and dehydration. I do not recommend regular intake of diuretic therapy due to tendency of causing severe dehydration in her situation I recommend consideration of discontinuation of amlodipine that is also known to cause leg edema and she agreed to try. She will continue close monitoring of her blood pressure at home.

## 2015-06-15 NOTE — Telephone Encounter (Signed)
Gave pt apt & avs °

## 2015-06-21 ENCOUNTER — Other Ambulatory Visit: Payer: Self-pay | Admitting: Hematology and Oncology

## 2015-06-22 ENCOUNTER — Ambulatory Visit (HOSPITAL_BASED_OUTPATIENT_CLINIC_OR_DEPARTMENT_OTHER): Payer: BLUE CROSS/BLUE SHIELD

## 2015-06-22 ENCOUNTER — Other Ambulatory Visit (HOSPITAL_BASED_OUTPATIENT_CLINIC_OR_DEPARTMENT_OTHER): Payer: BLUE CROSS/BLUE SHIELD

## 2015-06-22 VITALS — BP 137/97 | HR 77 | Temp 98.4°F | Resp 18

## 2015-06-22 DIAGNOSIS — C3411 Malignant neoplasm of upper lobe, right bronchus or lung: Secondary | ICD-10-CM | POA: Diagnosis not present

## 2015-06-22 DIAGNOSIS — Z5111 Encounter for antineoplastic chemotherapy: Secondary | ICD-10-CM

## 2015-06-22 LAB — CBC WITH DIFFERENTIAL/PLATELET
BASO%: 0.7 % (ref 0.0–2.0)
BASOS ABS: 0 10*3/uL (ref 0.0–0.1)
EOS ABS: 0.1 10*3/uL (ref 0.0–0.5)
EOS%: 1.7 % (ref 0.0–7.0)
HCT: 36.6 % (ref 34.8–46.6)
HGB: 11.7 g/dL (ref 11.6–15.9)
LYMPH%: 17.6 % (ref 14.0–49.7)
MCH: 30.7 pg (ref 25.1–34.0)
MCHC: 32.1 g/dL (ref 31.5–36.0)
MCV: 95.6 fL (ref 79.5–101.0)
MONO#: 0.3 10*3/uL (ref 0.1–0.9)
MONO%: 5.7 % (ref 0.0–14.0)
NEUT#: 3.8 10*3/uL (ref 1.5–6.5)
NEUT%: 74.3 % (ref 38.4–76.8)
Platelets: 135 10*3/uL — ABNORMAL LOW (ref 145–400)
RBC: 3.83 10*6/uL (ref 3.70–5.45)
RDW: 17.6 % — ABNORMAL HIGH (ref 11.2–14.5)
WBC: 5.1 10*3/uL (ref 3.9–10.3)
lymph#: 0.9 10*3/uL (ref 0.9–3.3)

## 2015-06-22 LAB — COMPREHENSIVE METABOLIC PANEL
ALT: 23 U/L (ref 0–55)
AST: 20 U/L (ref 5–34)
Albumin: 3.6 g/dL (ref 3.5–5.0)
Alkaline Phosphatase: 86 U/L (ref 40–150)
Anion Gap: 13 mEq/L — ABNORMAL HIGH (ref 3–11)
BUN: 17 mg/dL (ref 7.0–26.0)
CHLORIDE: 102 meq/L (ref 98–109)
CO2: 25 meq/L (ref 22–29)
Calcium: 9.4 mg/dL (ref 8.4–10.4)
Creatinine: 1 mg/dL (ref 0.6–1.1)
EGFR: 59 mL/min/{1.73_m2} — AB (ref >90)
GLUCOSE: 136 mg/dL (ref 70–140)
POTASSIUM: 3.9 meq/L (ref 3.5–5.1)
SODIUM: 140 meq/L (ref 136–145)
Total Bilirubin: 0.33 mg/dL (ref 0.20–1.20)
Total Protein: 8 g/dL (ref 6.4–8.3)

## 2015-06-22 MED ORDER — SODIUM CHLORIDE 0.9% FLUSH
10.0000 mL | INTRAVENOUS | Status: DC | PRN
  Administered 2015-06-22: 10 mL
  Filled 2015-06-22: qty 10

## 2015-06-22 MED ORDER — SODIUM CHLORIDE 0.9 % IV SOLN
700.0000 mg/m2 | Freq: Once | INTRAVENOUS | Status: AC
  Administered 2015-06-22: 1672 mg via INTRAVENOUS
  Filled 2015-06-22: qty 43.97

## 2015-06-22 MED ORDER — PALONOSETRON HCL INJECTION 0.25 MG/5ML
0.2500 mg | Freq: Once | INTRAVENOUS | Status: AC
  Administered 2015-06-22: 0.25 mg via INTRAVENOUS

## 2015-06-22 MED ORDER — SODIUM CHLORIDE 0.9 % IV SOLN
INTRAVENOUS | Status: DC
  Administered 2015-06-22: 11:00:00 via INTRAVENOUS

## 2015-06-22 MED ORDER — HEPARIN SOD (PORK) LOCK FLUSH 100 UNIT/ML IV SOLN
500.0000 [IU] | Freq: Once | INTRAVENOUS | Status: AC | PRN
  Administered 2015-06-22: 500 [IU]
  Filled 2015-06-22: qty 5

## 2015-06-22 MED ORDER — SODIUM CHLORIDE 0.9 % IV SOLN
Freq: Once | INTRAVENOUS | Status: AC
  Administered 2015-06-22: 12:00:00 via INTRAVENOUS
  Filled 2015-06-22: qty 5

## 2015-06-22 MED ORDER — VINORELBINE TARTRATE CHEMO INJECTION 50 MG/5ML
17.0000 mg/m2 | Freq: Once | INTRAVENOUS | Status: AC
  Administered 2015-06-22: 40 mg via INTRAVENOUS
  Filled 2015-06-22: qty 4

## 2015-06-22 MED ORDER — PALONOSETRON HCL INJECTION 0.25 MG/5ML
INTRAVENOUS | Status: AC
  Filled 2015-06-22: qty 5

## 2015-06-22 NOTE — Patient Instructions (Addendum)
[facility] Discharge Instructions for Patients Receiving Chemotherapy  Today you received the following chemotherapy agents Navelibine and Gemzar. To help prevent nausea and vomiting after your treatment, we encourage you to take your nausea medication as directed - NO ZOFRAN for 3 days .  If you develop nausea and vomiting that is not controlled by your nausea medication, call the clinic.   BELOW ARE SYMPTOMS THAT SHOULD BE REPORTED IMMEDIATELY:  *FEVER GREATER THAN 100.5 F  *CHILLS WITH OR WITHOUT FEVER  NAUSEA AND VOMITING THAT IS NOT CONTROLLED WITH YOUR NAUSEA MEDICATION  *UNUSUAL SHORTNESS OF BREATH  *UNUSUAL BRUISING OR BLEEDING  TENDERNESS IN MOUTH AND THROAT WITH OR WITHOUT PRESENCE OF ULCERS  *URINARY PROBLEMS  *BOWEL PROBLEMS  UNUSUAL RASH Items with * indicate a potential emergency and should be followed up as soon as possible.  Feel free to call the clinic you have any questions or concerns. The clinic phone number is [phone].  Please show the [facility] at check-in to the Emergency Department and triage nurse.   Pegfilgrastim injection What is this medicine? PEGFILGRASTIM (PEG fil gra stim) is a long-acting granulocyte colony-stimulating factor that stimulates the growth of neutrophils, a type of white blood cell important in the body's fight against infection. It is used to reduce the incidence of fever and infection in patients with certain types of cancer who are receiving chemotherapy that affects the bone marrow, and to increase survival after being exposed to high doses of radiation. This medicine may be used for other purposes; ask your health care provider or pharmacist if you have questions. What should I tell my health care provider before I take this medicine? They need to know if you have any of these conditions: -kidney disease -latex allergy -ongoing radiation therapy -sickle cell disease -skin reactions to acrylic  adhesives (On-Body Injector only) -an unusual or allergic reaction to pegfilgrastim, filgrastim, other medicines, foods, dyes, or preservatives -pregnant or trying to get pregnant -breast-feeding How should I use this medicine? This medicine is for injection under the skin. If you get this medicine at home, you will be taught how to prepare and give the pre-filled syringe or how to use the On-body Injector. Refer to the patient Instructions for Use for detailed instructions. Use exactly as directed. Take your medicine at regular intervals. Do not take your medicine more often than directed. It is important that you put your used needles and syringes in a special sharps container. Do not put them in a trash can. If you do not have a sharps container, call your pharmacist or healthcare provider to get one. Talk to your pediatrician regarding the use of this medicine in children. While this drug may be prescribed for selected conditions, precautions do apply. Overdosage: If you think you have taken too much of this medicine contact a poison control center or emergency room at once. NOTE: This medicine is only for you. Do not share this medicine with others. What if I miss a dose? It is important not to miss your dose. Call your doctor or health care professional if you miss your dose. If you miss a dose due to an On-body Injector failure or leakage, a new dose should be administered as soon as possible using a single prefilled syringe for manual use. What may interact with this medicine? Interactions have not been studied. Give your health care provider a list of all the medicines, herbs, non-prescription drugs, or dietary supplements you use. Also  tell them if you smoke, drink alcohol, or use illegal drugs. Some items may interact with your medicine. This list may not describe all possible interactions. Give your health care provider a list of all the medicines, herbs, non-prescription drugs, or dietary  supplements you use. Also tell them if you smoke, drink alcohol, or use illegal drugs. Some items may interact with your medicine. What should I watch for while using this medicine? You may need blood work done while you are taking this medicine. If you are going to need a MRI, CT scan, or other procedure, tell your doctor that you are using this medicine (On-Body Injector only). What side effects may I notice from receiving this medicine? Side effects that you should report to your doctor or health care professional as soon as possible: -allergic reactions like skin rash, itching or hives, swelling of the face, lips, or tongue -dizziness -fever -pain, redness, or irritation at site where injected -pinpoint red spots on the skin -red or dark-brown urine -shortness of breath or breathing problems -stomach or side pain, or pain at the shoulder -swelling -tiredness -trouble passing urine or change in the amount of urine Side effects that usually do not require medical attention (report to your doctor or health care professional if they continue or are bothersome): -bone pain -muscle pain This list may not describe all possible side effects. Call your doctor for medical advice about side effects. You may report side effects to FDA at 1-800-FDA-1088. Where should I keep my medicine? Keep out of the reach of children. Store pre-filled syringes in a refrigerator between 2 and 8 degrees C (36 and 46 degrees F). Do not freeze. Keep in carton to protect from light. Throw away this medicine if it is left out of the refrigerator for more than 48 hours. Throw away any unused medicine after the expiration date. NOTE: This sheet is a summary. It may not cover all possible information. If you have questions about this medicine, talk to your doctor, pharmacist, or health care provider.    2016, Elsevier/Gold Standard. (2014-02-04 14:30:14)

## 2015-06-23 ENCOUNTER — Ambulatory Visit (HOSPITAL_BASED_OUTPATIENT_CLINIC_OR_DEPARTMENT_OTHER): Payer: BLUE CROSS/BLUE SHIELD

## 2015-06-23 VITALS — BP 153/84 | HR 86 | Temp 98.5°F | Resp 17

## 2015-06-23 DIAGNOSIS — Z5189 Encounter for other specified aftercare: Secondary | ICD-10-CM

## 2015-06-23 DIAGNOSIS — C3411 Malignant neoplasm of upper lobe, right bronchus or lung: Secondary | ICD-10-CM

## 2015-06-23 MED ORDER — PEGFILGRASTIM INJECTION 6 MG/0.6ML
6.0000 mg | Freq: Once | SUBCUTANEOUS | Status: AC
  Administered 2015-06-23: 6 mg via SUBCUTANEOUS
  Filled 2015-06-23: qty 0.6

## 2015-06-23 NOTE — Patient Instructions (Signed)
Pegfilgrastim injection What is this medicine? PEGFILGRASTIM (PEG fil gra stim) is a long-acting granulocyte colony-stimulating factor that stimulates the growth of neutrophils, a type of white blood cell important in the body's fight against infection. It is used to reduce the incidence of fever and infection in patients with certain types of cancer who are receiving chemotherapy that affects the bone marrow, and to increase survival after being exposed to high doses of radiation. This medicine may be used for other purposes; ask your health care provider or pharmacist if you have questions. What should I tell my health care provider before I take this medicine? They need to know if you have any of these conditions: -kidney disease -latex allergy -ongoing radiation therapy -sickle cell disease -skin reactions to acrylic adhesives (On-Body Injector only) -an unusual or allergic reaction to pegfilgrastim, filgrastim, other medicines, foods, dyes, or preservatives -pregnant or trying to get pregnant -breast-feeding How should I use this medicine? This medicine is for injection under the skin. If you get this medicine at home, you will be taught how to prepare and give the pre-filled syringe or how to use the On-body Injector. Refer to the patient Instructions for Use for detailed instructions. Use exactly as directed. Take your medicine at regular intervals. Do not take your medicine more often than directed. It is important that you put your used needles and syringes in a special sharps container. Do not put them in a trash can. If you do not have a sharps container, call your pharmacist or healthcare provider to get one. Talk to your pediatrician regarding the use of this medicine in children. While this drug may be prescribed for selected conditions, precautions do apply. Overdosage: If you think you have taken too much of this medicine contact a poison control center or emergency room at  once. NOTE: This medicine is only for you. Do not share this medicine with others. What if I miss a dose? It is important not to miss your dose. Call your doctor or health care professional if you miss your dose. If you miss a dose due to an On-body Injector failure or leakage, a new dose should be administered as soon as possible using a single prefilled syringe for manual use. What may interact with this medicine? Interactions have not been studied. Give your health care provider a list of all the medicines, herbs, non-prescription drugs, or dietary supplements you use. Also tell them if you smoke, drink alcohol, or use illegal drugs. Some items may interact with your medicine. This list may not describe all possible interactions. Give your health care provider a list of all the medicines, herbs, non-prescription drugs, or dietary supplements you use. Also tell them if you smoke, drink alcohol, or use illegal drugs. Some items may interact with your medicine. What should I watch for while using this medicine? You may need blood work done while you are taking this medicine. If you are going to need a MRI, CT scan, or other procedure, tell your doctor that you are using this medicine (On-Body Injector only). What side effects may I notice from receiving this medicine? Side effects that you should report to your doctor or health care professional as soon as possible: -allergic reactions like skin rash, itching or hives, swelling of the face, lips, or tongue -dizziness -fever -pain, redness, or irritation at site where injected -pinpoint red spots on the skin -red or dark-brown urine -shortness of breath or breathing problems -stomach or side pain, or pain   at the shoulder -swelling -tiredness -trouble passing urine or change in the amount of urine Side effects that usually do not require medical attention (report to your doctor or health care professional if they continue or are  bothersome): -bone pain -muscle pain This list may not describe all possible side effects. Call your doctor for medical advice about side effects. You may report side effects to FDA at 1-800-FDA-1088. Where should I keep my medicine? Keep out of the reach of children. Store pre-filled syringes in a refrigerator between 2 and 8 degrees C (36 and 46 degrees F). Do not freeze. Keep in carton to protect from light. Throw away this medicine if it is left out of the refrigerator for more than 48 hours. Throw away any unused medicine after the expiration date. NOTE: This sheet is a summary. It may not cover all possible information. If you have questions about this medicine, talk to your doctor, pharmacist, or health care provider.    2016, Elsevier/Gold Standard. (2014-02-04 14:30:14)  

## 2015-06-30 ENCOUNTER — Other Ambulatory Visit: Payer: Self-pay | Admitting: Hematology and Oncology

## 2015-07-04 ENCOUNTER — Telehealth: Payer: Self-pay | Admitting: *Deleted

## 2015-07-04 NOTE — Telephone Encounter (Signed)
Pls resume full dose amlodipine and take HCTZ/Lasix that was previously prescribe for 3 days to get BP under control Please call back Thursday for BP monitoring

## 2015-07-04 NOTE — Telephone Encounter (Signed)
Pt returned call and lvm she understands instructions.  She already took a amlodipine after she called the first time. Her BP is now 155/93.  She will take the HCTZ now and for 3 days.  She comes in Wed for treatment and will let us know how she is doing then.

## 2015-07-04 NOTE — Telephone Encounter (Signed)
LVM for pt informing of Dr. Calton Dach instructions below to resume a whole tablet of Amlodipine daily start now and also resume Hydrochlorothiazide for three days starting today.  Asked pt to call back to confirm she received/ understands instructions.

## 2015-07-04 NOTE — Telephone Encounter (Signed)
Pt LVM reporting elevated BPs.  BP 190/111 one day this past weekend.  It was 155/108 at 7 am this morning and currently it is 187/111.  Pt c/o feeling like her brain is in a "fog" and she asks if she should resume Amlodipine at 1/2 pill?  States Dr. Alvy Bimler told her to stop it on her last visit.

## 2015-07-05 ENCOUNTER — Other Ambulatory Visit: Payer: Self-pay | Admitting: Hematology and Oncology

## 2015-07-05 ENCOUNTER — Telehealth: Payer: Self-pay | Admitting: *Deleted

## 2015-07-05 DIAGNOSIS — N390 Urinary tract infection, site not specified: Secondary | ICD-10-CM

## 2015-07-05 NOTE — Telephone Encounter (Signed)
Pt left VM; 1. Pt feels like she had a UTI over the weekend.  Symptoms have resolved with drinking cranberry juice but she wonders if Dr. Alvy Bimler should do a U/A when she comes in tomorrow? 2. She is going to the [facility] the week of June 19th and asks if she can skip chemo next week, week of [place] she will feel "good" for the beach trip.  She wants to resume the week following her beach trip.  3. She checked her BP this morning on left arm was 173/93 and right arm was 136/88.  She is taking amlodipine once a day and HCTZ for three days.  She asks if she should increase amlodipine to twice daily?

## 2015-07-05 NOTE — Telephone Encounter (Signed)
Informed pt of Dr. Calton Dach message.  She verbalized understanding.  She reports she continues to check her BP and it was as high as 191/122 at one point but only for about "15 minutes" then it went down to 176/86 after taking ativan.  Instructed pt if her BP "bottom number" continues to stay over 100 she will need to go to ED.  Pt verbalized understanding.

## 2015-07-05 NOTE — Telephone Encounter (Signed)
I will add orders for urine test tomorrow and review the calendar with her regarding the beach trip Tell her to take amlodipine and HCTZ again tomorrow morning and I will decide then based on labs and readings what to do with her BP meds Thanks

## 2015-07-06 ENCOUNTER — Ambulatory Visit: Payer: BLUE CROSS/BLUE SHIELD | Admitting: Nutrition

## 2015-07-06 ENCOUNTER — Telehealth: Payer: Self-pay | Admitting: Hematology and Oncology

## 2015-07-06 ENCOUNTER — Other Ambulatory Visit (HOSPITAL_BASED_OUTPATIENT_CLINIC_OR_DEPARTMENT_OTHER): Payer: BLUE CROSS/BLUE SHIELD

## 2015-07-06 ENCOUNTER — Ambulatory Visit (HOSPITAL_BASED_OUTPATIENT_CLINIC_OR_DEPARTMENT_OTHER): Payer: BLUE CROSS/BLUE SHIELD | Admitting: Hematology and Oncology

## 2015-07-06 ENCOUNTER — Ambulatory Visit (HOSPITAL_BASED_OUTPATIENT_CLINIC_OR_DEPARTMENT_OTHER): Payer: BLUE CROSS/BLUE SHIELD

## 2015-07-06 ENCOUNTER — Ambulatory Visit: Payer: BLUE CROSS/BLUE SHIELD

## 2015-07-06 VITALS — BP 148/82 | HR 90 | Temp 98.2°F | Resp 18 | Ht 74.0 in | Wt 229.2 lb

## 2015-07-06 DIAGNOSIS — T451X5A Adverse effect of antineoplastic and immunosuppressive drugs, initial encounter: Secondary | ICD-10-CM

## 2015-07-06 DIAGNOSIS — I10 Essential (primary) hypertension: Secondary | ICD-10-CM

## 2015-07-06 DIAGNOSIS — Z5111 Encounter for antineoplastic chemotherapy: Secondary | ICD-10-CM | POA: Diagnosis not present

## 2015-07-06 DIAGNOSIS — N390 Urinary tract infection, site not specified: Secondary | ICD-10-CM

## 2015-07-06 DIAGNOSIS — C3411 Malignant neoplasm of upper lobe, right bronchus or lung: Secondary | ICD-10-CM

## 2015-07-06 DIAGNOSIS — D72829 Elevated white blood cell count, unspecified: Secondary | ICD-10-CM | POA: Diagnosis not present

## 2015-07-06 DIAGNOSIS — D6481 Anemia due to antineoplastic chemotherapy: Secondary | ICD-10-CM

## 2015-07-06 LAB — URINALYSIS, MICROSCOPIC - CHCC
BILIRUBIN (URINE): NEGATIVE
Blood: NEGATIVE
GLUCOSE UR CHCC: NEGATIVE mg/dL
KETONES: NEGATIVE mg/dL
LEUKOCYTE ESTERASE: NEGATIVE
NITRITE: NEGATIVE
PH: 6 (ref 4.6–8.0)
Protein: 100 mg/dL
Specific Gravity, Urine: 1.02 (ref 1.003–1.035)
Urobilinogen, UR: 0.2 mg/dL (ref 0.2–1)

## 2015-07-06 LAB — COMPREHENSIVE METABOLIC PANEL
ALT: 17 U/L (ref 0–55)
ANION GAP: 13 meq/L — AB (ref 3–11)
AST: 15 U/L (ref 5–34)
Albumin: 3.6 g/dL (ref 3.5–5.0)
Alkaline Phosphatase: 135 U/L (ref 40–150)
BUN: 12.1 mg/dL (ref 7.0–26.0)
CALCIUM: 9.6 mg/dL (ref 8.4–10.4)
CHLORIDE: 100 meq/L (ref 98–109)
CO2: 26 mEq/L (ref 22–29)
CREATININE: 1.1 mg/dL (ref 0.6–1.1)
EGFR: 52 mL/min/{1.73_m2} — AB (ref >90)
Glucose: 155 mg/dl — ABNORMAL HIGH (ref 70–140)
POTASSIUM: 3.9 meq/L (ref 3.5–5.1)
Sodium: 140 mEq/L (ref 136–145)
Total Bilirubin: <0.3 mg/dL (ref 0.20–1.20)
Total Protein: 8 g/dL (ref 6.4–8.3)

## 2015-07-06 LAB — CBC WITH DIFFERENTIAL/PLATELET
BASO%: 0.1 % (ref 0.0–2.0)
BASOS ABS: 0 10*3/uL (ref 0.0–0.1)
EOS%: 1.3 % (ref 0.0–7.0)
Eosinophils Absolute: 0.2 10*3/uL (ref 0.0–0.5)
HEMATOCRIT: 36.6 % (ref 34.8–46.6)
HGB: 11.5 g/dL — ABNORMAL LOW (ref 11.6–15.9)
LYMPH#: 1.3 10*3/uL (ref 0.9–3.3)
LYMPH%: 8.9 % — ABNORMAL LOW (ref 14.0–49.7)
MCH: 31.1 pg (ref 25.1–34.0)
MCHC: 31.4 g/dL — AB (ref 31.5–36.0)
MCV: 98.9 fL (ref 79.5–101.0)
MONO#: 1 10*3/uL — AB (ref 0.1–0.9)
MONO%: 6.6 % (ref 0.0–14.0)
NEUT#: 12.5 10*3/uL — ABNORMAL HIGH (ref 1.5–6.5)
NEUT%: 83.1 % — AB (ref 38.4–76.8)
PLATELETS: 216 10*3/uL (ref 145–400)
RBC: 3.7 10*6/uL (ref 3.70–5.45)
RDW: 18.2 % — ABNORMAL HIGH (ref 11.2–14.5)
WBC: 15.1 10*3/uL — ABNORMAL HIGH (ref 3.9–10.3)

## 2015-07-06 MED ORDER — PALONOSETRON HCL INJECTION 0.25 MG/5ML
INTRAVENOUS | Status: AC
  Filled 2015-07-06: qty 5

## 2015-07-06 MED ORDER — SODIUM CHLORIDE 0.9 % IV SOLN
17.0000 mg/m2 | Freq: Once | INTRAVENOUS | Status: AC
  Administered 2015-07-06: 40 mg via INTRAVENOUS
  Filled 2015-07-06: qty 3

## 2015-07-06 MED ORDER — PALONOSETRON HCL INJECTION 0.25 MG/5ML
0.2500 mg | Freq: Once | INTRAVENOUS | Status: AC
  Administered 2015-07-06: 0.25 mg via INTRAVENOUS

## 2015-07-06 MED ORDER — SODIUM CHLORIDE 0.9% FLUSH
10.0000 mL | INTRAVENOUS | Status: DC | PRN
Start: 2015-07-06 — End: 2015-07-06
  Administered 2015-07-06: 10 mL
  Filled 2015-07-06: qty 10

## 2015-07-06 MED ORDER — SODIUM CHLORIDE 0.9 % IV SOLN
Freq: Once | INTRAVENOUS | Status: AC
  Administered 2015-07-06: 10:00:00 via INTRAVENOUS
  Filled 2015-07-06: qty 5

## 2015-07-06 MED ORDER — SODIUM CHLORIDE 0.9 % IJ SOLN
10.0000 mL | INTRAMUSCULAR | Status: DC | PRN
  Administered 2015-07-06: 10 mL via INTRAVENOUS
  Filled 2015-07-06: qty 10

## 2015-07-06 MED ORDER — GEMCITABINE HCL CHEMO INJECTION 1 GM/26.3ML
700.0000 mg/m2 | Freq: Once | INTRAVENOUS | Status: AC
  Administered 2015-07-06: 1672 mg via INTRAVENOUS
  Filled 2015-07-06: qty 43.97

## 2015-07-06 MED ORDER — SODIUM CHLORIDE 0.9 % IV SOLN
INTRAVENOUS | Status: DC
  Administered 2015-07-06: 10:00:00 via INTRAVENOUS

## 2015-07-06 MED ORDER — HEPARIN SOD (PORK) LOCK FLUSH 100 UNIT/ML IV SOLN
500.0000 [IU] | Freq: Once | INTRAVENOUS | Status: AC | PRN
  Administered 2015-07-06: 500 [IU]
  Filled 2015-07-06: qty 5

## 2015-07-06 NOTE — Patient Instructions (Signed)

## 2015-07-06 NOTE — Telephone Encounter (Signed)
Pt will p/u sched in tx room

## 2015-07-06 NOTE — Patient Instructions (Signed)
[facility] Discharge Instructions for Patients Receiving Chemotherapy  Today you received the following chemotherapy agents Navelibine and Gemzar. To help prevent nausea and vomiting after your treatment, we encourage you to take your nausea medication as directed - NO ZOFRAN for 3 days .  If you develop nausea and vomiting that is not controlled by your nausea medication, call the clinic.   BELOW ARE SYMPTOMS THAT SHOULD BE REPORTED IMMEDIATELY:  *FEVER GREATER THAN 100.5 F  *CHILLS WITH OR WITHOUT FEVER  NAUSEA AND VOMITING THAT IS NOT CONTROLLED WITH YOUR NAUSEA MEDICATION  *UNUSUAL SHORTNESS OF BREATH  *UNUSUAL BRUISING OR BLEEDING  TENDERNESS IN MOUTH AND THROAT WITH OR WITHOUT PRESENCE OF ULCERS  *URINARY PROBLEMS  *BOWEL PROBLEMS  UNUSUAL RASH Items with * indicate a potential emergency and should be followed up as soon as possible.  Feel free to call the clinic you have any questions or concerns. The clinic phone number is [phone].  Please show the [facility] at check-in to the Emergency Department and triage nurse.

## 2015-07-06 NOTE — Progress Notes (Signed)
Nutrition follow up completed with patient treated for lung cancer. She reports she has lost her taste for sweet foods and thinks this has contributed to weight loss. Weight documented as 229.2 pounds on June 7, decreased from 233.1 pounds on May 17. Patient denies nutrition impact symptoms. Patient states edema comes and goes.  Nutrition Diagnosis: Food and Nutrition Knowledge Deficit improved.  Intervention: Educated patient to consume adequate calories and protein to promote maintenance of lean body mass. Questions answered and teach back method used.  Monitoring, Evaluation, Goals: Patient will tolerate adequate calories and protein for maintenance of lean body mass.  Next Visit: Wednesday, July 5, during infusion.

## 2015-07-07 ENCOUNTER — Encounter: Payer: Self-pay | Admitting: Hematology and Oncology

## 2015-07-07 DIAGNOSIS — D72829 Elevated white blood cell count, unspecified: Secondary | ICD-10-CM | POA: Insufficient documentation

## 2015-07-07 LAB — URINE CULTURE: ORGANISM ID, BACTERIA: NO GROWTH

## 2015-07-07 NOTE — Assessment & Plan Note (Signed)
This is likely due to recent G-CSF. She had mild dysuria. Urine culture is pending. I will call the patient with test results.

## 2015-07-07 NOTE — Progress Notes (Signed)
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   12/13/2013 Imaging CT scan show large mass in the right lung apex likely representing primary lung carcinoma or large metastasis. Metastases demonstrated in the pretracheal lymph nodes, T1 vertebral body and possibly sacrum.   12/14/2013 Initial Diagnosis Adenocarcinoma of upper lobe of right lung   12/14/2013 Pathology Results Accession: [id number] biopsy from right upper lobe showed adenocarcinoma. [facility] testing was positive for several mutations without any known treatment options now   12/17/2013 Imaging MRI of the head is negative.   12/23/2013 - 02/05/2014 Radiation Therapy She received radiation therapy to the mediastinal mass and bone   12/28/2013 Imaging PET/CT scan showed mediastinal mass, lymphadenopathy and T1 involvement.   01/01/2014 - 01/29/2014 Chemotherapy Weekly carboplatin and Taxol were added   03/12/2014 Imaging Repeat PET scan showed near complete response to treatment.   03/16/2014 - 05/28/2014 Chemotherapy She started treatment with Alimta for maintenance   07/07/2014 Imaging PET scan showed disease progression   07/15/2014 - 09/23/2014 Chemotherapy She received palliative treatment with Opdivo   08/13/2014 - 08/25/2014 Radiation Therapy She completed palliative radiation therapy to the bone.   10/05/2014 Imaging PET CT showed disease progression except for site of  recent radiation therapy   10/13/2014 - 04/06/2015 Chemotherapy She received Rx with weekly carbo/taxol, 2 weeks on 1 week off with Avastin every 3 weeks   12/15/2014 Imaging PET scan showed positive response to Rx   01/18/2015 Imaging Ct chest showed disease improvement   04/19/2015 Imaging Ct chest diease progression   04/27/2015 -  Chemotherapy Treatment is switched to Gemzar and Navelbine   05/04/2015 Adverse Reaction Treatment was missed & interrupted due to recurrent UTI   06/13/2015 Imaging MRI head is negative    INTERVAL HISTORY: Please see below for problem oriented charting. She is seen prior to treatment. She denies any headache or visual changes. She had mild dysuria but no frequency, urgency or hematuria. She denies nausea with recent dose adjustment. No new neurological deficit or worsening peripheral neuropathy while on treatment  REVIEW OF SYSTEMS:   Constitutional: Denies fevers, chills or abnormal weight loss Eyes: Denies blurriness of vision Ears, nose, mouth, throat, and face: Denies mucositis or sore throat Respiratory: Denies cough, dyspnea or wheezes Cardiovascular: Denies palpitation, chest discomfort or lower extremity swelling Gastrointestinal:  Denies nausea, heartburn or change in bowel habits Skin: Denies abnormal skin rashes Lymphatics: Denies new lymphadenopathy or easy bruising Neurological:Denies numbness, tingling or new weaknesses Behavioral/Psych: Mood is stable, no new changes  All other systems were reviewed with the patient and are negative.  I have reviewed the past medical history, past surgical history, social history and family history with the patient and they are unchanged from previous note.  ALLERGIES:  is allergic to codeine; penicillins; and sulfa antibiotics.  MEDICATIONS:  Current Outpatient Prescriptions  Medication Sig Dispense Refill  .  albuterol (PROVENTIL HFA;VENTOLIN HFA) 108 (90 BASE) MCG/ACT inhaler Inhale 2 puffs into the  lungs every 4 (four) hours as needed for wheezing or shortness of breath. 1 Inhaler 5  . amLODipine (NORVASC) 10 MG tablet TAKE 1 TABLET (10 MG TOTAL) BY MOUTH DAILY. 90 tablet 0  . fluticasone (FLONASE) 50 MCG/ACT nasal spray Place 2 sprays into both nostrils daily. 16 g 5  . Fluticasone Furoate-Vilanterol (BREO ELLIPTA) 200-25 MCG/INH AEPB Inhale 1 puff into the lungs daily. Reported on 04/20/2015    . hydrochlorothiazide (HYDRODIURIL) 25 MG tablet TAKE 1 TABLET BY MOUTH EVERY DAY 30 tablet 1  . levothyroxine (SYNTHROID, LEVOTHROID) 200 MCG tablet TAKE 1 TABLET BY MOUTH DAILY BEFORE BREAKFAST. 90 tablet 0  . lidocaine-prilocaine (EMLA) cream Apply 1 application topically as needed. 90 g 0  . LORazepam (ATIVAN) 0.5 MG tablet TAKE 1 TABLET BY MOUTH EVERY 8 HOURS AS NEEDED FOR ANXIETY OR FOR NAUSEA 90 tablet 0  . mirtazapine (REMERON) 15 MG tablet TAKE 1 TABLET (15 MG TOTAL) BY MOUTH AT BEDTIME AS NEEDED (FOR SLEEP.). 90 tablet 6  . ondansetron (ZOFRAN) 8 MG tablet TAKE 1 TABLET BY MOUTH EVERY 6 HOURS AS NEEDED FOR NAUSEA. 90 tablet 0  . prochlorperazine (COMPAZINE) 10 MG tablet TAKE 1 TABLET BY MOUTH EVERY 6 (SIX) HOURS AS NEEDED FOR NAUSEA OR VOMITING. 30 tablet 1  . ranitidine (ZANTAC) 150 MG tablet Take 1 tablet (150 mg total) by mouth every evening. 90 tablet 2  . sucralfate (CARAFATE) 1 G tablet Take 1 g by mouth 4 (four) times daily. Reported on 01/12/2015  0   No current facility-administered medications for this visit.    PHYSICAL EXAMINATION: ECOG PERFORMANCE STATUS: 1 - Symptomatic but completely ambulatory  Filed Vitals:   07/06/15 0910  BP: 148/82  Pulse: 90  Temp: 98.2 F (36.8 C)  Resp: 18   Filed Weights   07/06/15 0910  Weight: 229 lb 3.2 oz (103.964 kg)    GENERAL:alert, no distress and comfortable. She is obese SKIN: skin color, texture, turgor are normal, no rashes or significant lesions EYES: normal, Conjunctiva are pink and non-injected, sclera  clear OROPHARYNX:no exudate, no erythema and lips, buccal mucosa, and tongue normal  NECK: supple, thyroid normal size, non-tender, without nodularity LYMPH:  no palpable lymphadenopathy in the cervical, axillary or inguinal LUNGS: clear to auscultation and percussion with normal breathing effort HEART: regular rate & rhythm and no murmurs with mild bilateral lower extremity edema ABDOMEN:abdomen soft, non-tender and normal bowel sounds Musculoskeletal:no cyanosis of digits and no clubbing  NEURO: alert & oriented x 3 with fluent speech, no focal motor/sensory deficits  LABORATORY DATA:  I have reviewed the data as listed    Component Value Date/Time   NA 140 07/06/2015 0849   NA 138 06/24/2014 1422   K 3.9 07/06/2015 0849   K 4.0 06/24/2014 1422   CL 96* 06/24/2014 1422   CO2 26 07/06/2015 0849   CO2 30 06/24/2014 1422   GLUCOSE 155* 07/06/2015 0849   GLUCOSE 151* 06/24/2014 1422   BUN 12.1 07/06/2015 0849   BUN 13 06/24/2014 1422   CREATININE 1.1 07/06/2015 0849   CREATININE 1.54* 06/24/2014 1422   CALCIUM 9.6 07/06/2015 0849   CALCIUM 9.1 06/24/2014 1422   PROT 8.0 07/06/2015 0849   PROT 7.5 12/13/2013 2018   ALBUMIN 3.6 07/06/2015 0849   ALBUMIN 3.7 12/13/2013 2018   AST 15 07/06/2015 0849   AST 17 12/13/2013 2018   ALT 17  07/06/2015 0849   ALT 14 12/13/2013 2018   ALKPHOS 135 07/06/2015 0849   ALKPHOS 80 12/13/2013 2018   BILITOT <0.30 07/06/2015 0849   BILITOT 0.3 12/13/2013 2018   GFRNONAA 36* 06/24/2014 1422   GFRAA 41* 06/24/2014 1422    No results found for: SPEP, UPEP  Lab Results  Component Value Date   WBC 15.1* 07/06/2015   NEUTROABS 12.5* 07/06/2015   HGB 11.5* 07/06/2015   HCT 36.6 07/06/2015   MCV 98.9 07/06/2015   PLT 216 07/06/2015      Chemistry      Component Value Date/Time   NA 140 07/06/2015 0849   NA 138 06/24/2014 1422   K 3.9 07/06/2015 0849   K 4.0 06/24/2014 1422   CL 96* 06/24/2014 1422   CO2 26 07/06/2015 0849   CO2 30  06/24/2014 1422   BUN 12.1 07/06/2015 0849   BUN 13 06/24/2014 1422   CREATININE 1.1 07/06/2015 0849   CREATININE 1.54* 06/24/2014 1422      Component Value Date/Time   CALCIUM 9.6 07/06/2015 0849   CALCIUM 9.1 06/24/2014 1422   ALKPHOS 135 07/06/2015 0849   ALKPHOS 80 12/13/2013 2018   AST 15 07/06/2015 0849   AST 17 12/13/2013 2018   ALT 17 07/06/2015 0849   ALT 14 12/13/2013 2018   BILITOT <0.30 07/06/2015 0849   BILITOT 0.3 12/13/2013 2018      ASSESSMENT & PLAN:  Adenocarcinoma of upper lobe of right lung The patient tolerated last treatment well without significant side effects such as nausea. I will proceed to reduce 30% of chemotherapy on both agents, continue current anti-emetics and also G-CSF support after day 8 treatment.  The patient would like to take a treatment break for a trip to the beach from 07/18/15 to 07/21/15 and requested day 8 to be omitted from current cycle which I think is reasonable For that reason, she would not need Neulasta I recommend restaging after cycle 4 of treatment    Anemia due to antineoplastic chemotherapy This is likely due to recent treatment. The patient denies recent history of bleeding such as epistaxis, hematuria or hematochezia. She is asymptomatic from the anemia. I will observe for now.  She does not require transfusion now. I will continue the chemotherapy at current dose without dosage adjustment.  If the anemia gets progressive worse in the future, I might have to delay her treatment or adjust the chemotherapy dose.   Essential hypertension Her blood pressure fluctuated widely with therapy. I will continue amlodipine and recommend she take hydrochlorothiazide half a tablet every other day for blood pressure control  Leukocytosis This is likely due to recent G-CSF. She had mild dysuria. Urine culture is pending. I will call the patient with test results.   No orders of the defined types were placed in this encounter.    All questions were answered. The patient knows to call the clinic with any problems, questions or concerns. No barriers to learning was detected. I spent 20 minutes counseling the patient face to face. The total time spent in the appointment was 25 minutes and more than 50% was on counseling and review of test results     [facility], [place], MD 07/07/2015 8:56 AM

## 2015-07-07 NOTE — Assessment & Plan Note (Signed)

## 2015-07-07 NOTE — Assessment & Plan Note (Signed)
Her blood pressure fluctuated widely with therapy. I will continue amlodipine and recommend she take hydrochlorothiazide half a tablet every other day for blood pressure control

## 2015-07-07 NOTE — Assessment & Plan Note (Signed)
The patient tolerated last treatment well without significant side effects such as nausea. I will proceed to reduce 30% of chemotherapy on both agents, continue current anti-emetics and also G-CSF support after day 8 treatment.  The patient would like to take a treatment break for a trip to the beach from 07/18/15 to 07/21/15 and requested day 8 to be omitted from current cycle which I think is reasonable For that reason, she would not need Neulasta I recommend restaging after cycle 4 of treatment

## 2015-07-08 ENCOUNTER — Telehealth: Payer: Self-pay | Admitting: *Deleted

## 2015-07-08 NOTE — Telephone Encounter (Signed)
LVM for pt informing of Dr. Calton Dach message.  Asked her to return nurse's call if any questions/ problems.

## 2015-07-08 NOTE — Telephone Encounter (Signed)
-----   Message from [name], MD sent at 07/08/2015  8:17 AM EDT ----- Regarding: urine culture neg Pls let her know she does not have UTI ----- Message -----    From: [facility]    Sent: 07/06/2015   9:10 AM      To: [name], MD

## 2015-07-11 ENCOUNTER — Encounter: Payer: Self-pay | Admitting: Hematology and Oncology

## 2015-07-11 ENCOUNTER — Telehealth: Payer: Self-pay | Admitting: *Deleted

## 2015-07-11 NOTE — Progress Notes (Signed)
left for dr. Alvy Bimler to sign

## 2015-07-11 NOTE — Telephone Encounter (Signed)
Dr. Alvy Bimler signed disability form for pt.. Form placed in bin for managed care dept to pick up.  Informed pt of form being worked on and asked her what date her Disability started and pt states April 21 this year.  Informed pt Dr. Alvy Bimler does not recommend her returning to work therefore did not put expected return date.  Pt verbalized understanding.

## 2015-07-12 ENCOUNTER — Encounter: Payer: Self-pay | Admitting: Hematology and Oncology

## 2015-07-12 NOTE — Progress Notes (Signed)
left for dr. Alvy Bimler to sign- faxed [phone] and mailed copy to patient-sent to medical records

## 2015-07-13 ENCOUNTER — Telehealth: Payer: Self-pay | Admitting: *Deleted

## 2015-07-13 ENCOUNTER — Ambulatory Visit: Payer: BLUE CROSS/BLUE SHIELD

## 2015-07-13 ENCOUNTER — Other Ambulatory Visit: Payer: BLUE CROSS/BLUE SHIELD

## 2015-07-13 MED ORDER — AMLODIPINE BESYLATE 10 MG PO TABS: 10.0000 mg | ORAL_TABLET | Freq: Every day | ORAL | Status: DC

## 2015-07-13 MED ORDER — LISINOPRIL 10 MG PO TABS: 10.0000 mg | ORAL_TABLET | Freq: Every day | ORAL | Status: DC

## 2015-07-13 NOTE — Telephone Encounter (Signed)
Informed pt of Dr. Calton Dach note/ instructions.  She verbalized understanding will take Amlodipine 10 mg and Lisinopril 10 mg tonight and every night.  She will take one whole HCTZ every day in the mornings.  She will continue to monitor her BPs and update [place] on Monday or sooner if she needs.  Pt states needs refill on Amlodipine for 90 day supply.  Sent Refill amlodipine and new Rx Lisinopril to [facility].  Pt reports she has been checking her BP on right arm also and her Left Arm always reads higher than the right.  Left Arm is reading 186/111 right now and Right arm is 132/88.  But her Right arm was elevated at 146/100 last night.

## 2015-07-13 NOTE — Telephone Encounter (Signed)
I want to also make sure she checks the other arm. i think in the last BP in the left arm was greater than right arm Please recommend amlodipine 10 daily in the evening, full dose HCTZ 25 mg in the morning and call in lisinopril 10 mg in the evening (30 tabs, 9 refills). Please call us back Monday next week for report

## 2015-07-13 NOTE — Telephone Encounter (Signed)
Pt reports BP last night before bed was 169/103.  She took Amlodipine 10 mg and 1/2 HCTZ this morning at 7:30 am and her BP now 1 hr later is 176/109.

## 2015-07-14 ENCOUNTER — Ambulatory Visit: Payer: BLUE CROSS/BLUE SHIELD

## 2015-07-14 NOTE — Telephone Encounter (Signed)
VM message from patient this morning stating that her BP last night was 196/111.  Pt took her anti-hypertensives as directed last night. This morning she checked her BP @ 7:30 am and it was 137/73; she repeated it and the BP was 98/64.  Pt asking what she should do this am regarding her medications.

## 2015-07-14 NOTE — Telephone Encounter (Signed)
Spoke with patient. She has already taken her HCTZ. Pressure now is 130/80, gave instructions for BP medications, in the future, from dr Alvy Bimler in previous note

## 2015-07-14 NOTE — Telephone Encounter (Signed)
For this morning, I would hold HCTZ.  I recommend she checks her BP tonight.  If SBP is >786 or diastolic BP is greater than 80, she resume both lisinopril and amlodipine. Otherwise she hold both and please call back tomorrow again Thanks

## 2015-07-15 ENCOUNTER — Telehealth: Payer: Self-pay | Admitting: *Deleted

## 2015-07-15 NOTE — Telephone Encounter (Signed)
Patient calling to say her blood pressures are better and she is following dr gorsuch's guidelines for HTN medications, she is feeling much better today.

## 2015-07-27 ENCOUNTER — Ambulatory Visit (HOSPITAL_BASED_OUTPATIENT_CLINIC_OR_DEPARTMENT_OTHER): Payer: BLUE CROSS/BLUE SHIELD | Admitting: Hematology and Oncology

## 2015-07-27 ENCOUNTER — Ambulatory Visit: Payer: BLUE CROSS/BLUE SHIELD

## 2015-07-27 ENCOUNTER — Telehealth: Payer: Self-pay | Admitting: Hematology and Oncology

## 2015-07-27 ENCOUNTER — Encounter: Payer: Self-pay | Admitting: Hematology and Oncology

## 2015-07-27 ENCOUNTER — Ambulatory Visit (HOSPITAL_BASED_OUTPATIENT_CLINIC_OR_DEPARTMENT_OTHER): Payer: BLUE CROSS/BLUE SHIELD

## 2015-07-27 ENCOUNTER — Other Ambulatory Visit (HOSPITAL_BASED_OUTPATIENT_CLINIC_OR_DEPARTMENT_OTHER): Payer: BLUE CROSS/BLUE SHIELD

## 2015-07-27 VITALS — BP 140/75 | HR 77 | Temp 98.6°F | Resp 18 | Ht 74.0 in | Wt 231.6 lb

## 2015-07-27 DIAGNOSIS — C3411 Malignant neoplasm of upper lobe, right bronchus or lung: Secondary | ICD-10-CM

## 2015-07-27 DIAGNOSIS — J208 Acute bronchitis due to other specified organisms: Secondary | ICD-10-CM

## 2015-07-27 DIAGNOSIS — Z5111 Encounter for antineoplastic chemotherapy: Secondary | ICD-10-CM | POA: Diagnosis not present

## 2015-07-27 DIAGNOSIS — T451X5A Adverse effect of antineoplastic and immunosuppressive drugs, initial encounter: Secondary | ICD-10-CM

## 2015-07-27 DIAGNOSIS — G620 Drug-induced polyneuropathy: Secondary | ICD-10-CM | POA: Diagnosis not present

## 2015-07-27 DIAGNOSIS — D6481 Anemia due to antineoplastic chemotherapy: Secondary | ICD-10-CM

## 2015-07-27 DIAGNOSIS — R609 Edema, unspecified: Secondary | ICD-10-CM

## 2015-07-27 DIAGNOSIS — I10 Essential (primary) hypertension: Secondary | ICD-10-CM

## 2015-07-27 DIAGNOSIS — J209 Acute bronchitis, unspecified: Secondary | ICD-10-CM

## 2015-07-27 LAB — COMPREHENSIVE METABOLIC PANEL
ALK PHOS: 93 U/L (ref 40–150)
ALT: 9 U/L (ref 0–55)
ANION GAP: 12 meq/L — AB (ref 3–11)
AST: 12 U/L (ref 5–34)
Albumin: 3.4 g/dL — ABNORMAL LOW (ref 3.5–5.0)
BUN: 12.1 mg/dL (ref 7.0–26.0)
CO2: 26 meq/L (ref 22–29)
Calcium: 9.7 mg/dL (ref 8.4–10.4)
Chloride: 105 mEq/L (ref 98–109)
Creatinine: 0.9 mg/dL (ref 0.6–1.1)
EGFR: 73 mL/min/{1.73_m2} — AB (ref >90)
GLUCOSE: 126 mg/dL (ref 70–140)
Potassium: 3.9 mEq/L (ref 3.5–5.1)
SODIUM: 143 meq/L (ref 136–145)
Total Bilirubin: <0.3 mg/dL (ref 0.20–1.20)
Total Protein: 7.7 g/dL (ref 6.4–8.3)

## 2015-07-27 LAB — CBC WITH DIFFERENTIAL/PLATELET
BASO%: 0.3 % (ref 0.0–2.0)
BASOS ABS: 0 10*3/uL (ref 0.0–0.1)
EOS ABS: 0.2 10*3/uL (ref 0.0–0.5)
EOS%: 2.1 % (ref 0.0–7.0)
HCT: 35.2 % (ref 34.8–46.6)
HGB: 10.8 g/dL — ABNORMAL LOW (ref 11.6–15.9)
LYMPH%: 14.1 % (ref 14.0–49.7)
MCH: 30.8 pg (ref 25.1–34.0)
MCHC: 30.7 g/dL — AB (ref 31.5–36.0)
MCV: 100.3 fL (ref 79.5–101.0)
MONO#: 0.7 10*3/uL (ref 0.1–0.9)
MONO%: 10 % (ref 0.0–14.0)
NEUT#: 5.2 10*3/uL (ref 1.5–6.5)
NEUT%: 73.5 % (ref 38.4–76.8)
PLATELETS: 275 10*3/uL (ref 145–400)
RBC: 3.51 10*6/uL — AB (ref 3.70–5.45)
RDW: 19 % — ABNORMAL HIGH (ref 11.2–14.5)
WBC: 7.1 10*3/uL (ref 3.9–10.3)
lymph#: 1 10*3/uL (ref 0.9–3.3)

## 2015-07-27 MED ORDER — PALONOSETRON HCL INJECTION 0.25 MG/5ML
0.2500 mg | Freq: Once | INTRAVENOUS | Status: AC
  Administered 2015-07-27: 0.25 mg via INTRAVENOUS

## 2015-07-27 MED ORDER — SODIUM CHLORIDE 0.9 % IJ SOLN
10.0000 mL | INTRAMUSCULAR | Status: DC | PRN
  Administered 2015-07-27: 10 mL via INTRAVENOUS
  Filled 2015-07-27: qty 10

## 2015-07-27 MED ORDER — SODIUM CHLORIDE 0.9 % IV SOLN
Freq: Once | INTRAVENOUS | Status: AC
  Administered 2015-07-27: 14:00:00 via INTRAVENOUS
  Filled 2015-07-27: qty 5

## 2015-07-27 MED ORDER — SODIUM CHLORIDE 0.9 % IV SOLN
17.0000 mg/m2 | Freq: Once | INTRAVENOUS | Status: AC
  Administered 2015-07-27: 40 mg via INTRAVENOUS
  Filled 2015-07-27: qty 4

## 2015-07-27 MED ORDER — SODIUM CHLORIDE 0.9% FLUSH
10.0000 mL | INTRAVENOUS | Status: DC | PRN
  Administered 2015-07-27: 10 mL
  Filled 2015-07-27: qty 10

## 2015-07-27 MED ORDER — AZITHROMYCIN 500 MG PO TABS: 500.0000 mg | ORAL_TABLET | Freq: Every day | ORAL | Status: DC

## 2015-07-27 MED ORDER — HEPARIN SOD (PORK) LOCK FLUSH 100 UNIT/ML IV SOLN
500.0000 [IU] | Freq: Once | INTRAVENOUS | Status: AC | PRN
Start: 2015-07-27 — End: 2015-07-27
  Administered 2015-07-27: 500 [IU]
  Filled 2015-07-27: qty 5

## 2015-07-27 MED ORDER — SODIUM CHLORIDE 0.9 % IV SOLN
700.0000 mg/m2 | Freq: Once | INTRAVENOUS | Status: AC
  Administered 2015-07-27: 1672 mg via INTRAVENOUS
  Filled 2015-07-27: qty 43.97

## 2015-07-27 MED ORDER — SODIUM CHLORIDE 0.9 % IV SOLN
INTRAVENOUS | Status: DC
  Administered 2015-07-27: 13:00:00 via INTRAVENOUS

## 2015-07-27 NOTE — Assessment & Plan Note (Signed)
She has signs of infections with productive cough. I do not feel it is necessary to delay chemo I will cover her with a course of oral antibiotics and Neulasta injection on day 8

## 2015-07-27 NOTE — Progress Notes (Signed)
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   12/13/2013 Imaging CT scan show large mass in the right lung apex likely representing primary lung carcinoma or large metastasis. Metastases demonstrated in the pretracheal lymph nodes, T1 vertebral body and possibly sacrum.   12/14/2013 Initial Diagnosis Adenocarcinoma of upper lobe of right lung   12/14/2013 Pathology Results Accession: [id number] biopsy from right upper lobe showed adenocarcinoma. [facility] testing was positive for several mutations without any known treatment options now   12/17/2013 Imaging MRI of the head is negative.   12/23/2013 - 02/05/2014 Radiation Therapy She received radiation therapy to the mediastinal mass and bone   12/28/2013 Imaging PET/CT scan showed mediastinal mass, lymphadenopathy and T1 involvement.   01/01/2014 - 01/29/2014 Chemotherapy Weekly carboplatin and Taxol were added   03/12/2014 Imaging Repeat PET scan showed near complete response to treatment.   03/16/2014 - 05/28/2014 Chemotherapy She started treatment with Alimta for maintenance   07/07/2014 Imaging PET scan showed disease progression   07/15/2014 - 09/23/2014 Chemotherapy She received palliative treatment with Opdivo   08/13/2014 - 08/25/2014 Radiation Therapy She completed palliative radiation therapy to the bone.   10/05/2014 Imaging PET CT showed disease progression except for site of  recent radiation therapy   10/13/2014 - 04/06/2015 Chemotherapy She received Rx with weekly carbo/taxol, 2 weeks on 1 week off with Avastin every 3 weeks   12/15/2014 Imaging PET scan showed positive response to Rx   01/18/2015 Imaging Ct chest showed disease improvement   04/19/2015 Imaging Ct chest diease progression   04/27/2015 -  Chemotherapy Treatment is switched to Gemzar and Navelbine   05/04/2015 Adverse Reaction Treatment was missed & interrupted due to recurrent UTI   06/13/2015 Imaging MRI head is negative    INTERVAL HISTORY: Please see below for problem oriented charting. She returns for further follow-up and chemotherapy. She had recent productive cough of yellowish sputum. Denies fevers or chills. No recent nausea or vomiting. She has persistent neuropathy but is not severe. She complained of persistent bilateral lower extremity edema but not severe.  REVIEW OF SYSTEMS:   Constitutional: Denies fevers, chills or abnormal weight loss Eyes: Denies blurriness of vision Ears, nose, mouth, throat, and face: Denies mucositis or sore throat Cardiovascular: Denies palpitation, chest discomfort or lower extremity swelling Gastrointestinal:  Denies nausea, heartburn or change in bowel habits Skin: Denies abnormal skin rashes Lymphatics: Denies new lymphadenopathy or easy bruising Neurological:Denies numbness, tingling or new weaknesses Behavioral/Psych: Mood is stable, no new changes  All other systems were reviewed with the patient and are negative.  I have reviewed the past medical history, past surgical history, social history and family history with the patient and they are unchanged from previous note.  ALLERGIES:  is allergic to codeine; penicillins; and sulfa antibiotics.  MEDICATIONS:  Current Outpatient Prescriptions  Medication Sig Dispense Refill  . albuterol (PROVENTIL HFA;VENTOLIN HFA)  108 (90 BASE) MCG/ACT inhaler Inhale 2 puffs into the lungs every 4 (four) hours as  needed for wheezing or shortness of breath. 1 Inhaler 5  . fluticasone (FLONASE) 50 MCG/ACT nasal spray Place 2 sprays into both nostrils daily. 16 g 5  . Fluticasone Furoate-Vilanterol (BREO ELLIPTA) 200-25 MCG/INH AEPB Inhale 1 puff into the lungs daily. Reported on 04/20/2015    . levothyroxine (SYNTHROID, LEVOTHROID) 200 MCG tablet TAKE 1 TABLET BY MOUTH DAILY BEFORE BREAKFAST. 90 tablet 0  . lidocaine-prilocaine (EMLA) cream Apply 1 application topically as needed. 90 g 0  . LORazepam (ATIVAN) 0.5 MG tablet TAKE 1 TABLET BY MOUTH EVERY 8 HOURS AS NEEDED FOR ANXIETY OR FOR NAUSEA 90 tablet 0  . mirtazapine (REMERON) 15 MG tablet TAKE 1 TABLET (15 MG TOTAL) BY MOUTH AT BEDTIME AS NEEDED (FOR SLEEP.). 90 tablet 6  . ondansetron (ZOFRAN) 8 MG tablet TAKE 1 TABLET BY MOUTH EVERY 6 HOURS AS NEEDED FOR NAUSEA. 90 tablet 0  . prochlorperazine (COMPAZINE) 10 MG tablet TAKE 1 TABLET BY MOUTH EVERY 6 (SIX) HOURS AS NEEDED FOR NAUSEA OR VOMITING. 30 tablet 1  . ranitidine (ZANTAC) 150 MG tablet Take 1 tablet (150 mg total) by mouth every evening. 90 tablet 2  . sucralfate (CARAFATE) 1 G tablet Take 1 g by mouth 4 (four) times daily. Reported on 01/12/2015  0  . amLODipine (NORVASC) 10 MG tablet Take 1 tablet (10 mg total) by mouth at bedtime. (Patient not taking: Reported on 07/27/2015) 90 tablet 2  . azithromycin (ZITHROMAX) 500 MG tablet Take 1 tablet (500 mg total) by mouth daily. 3 tablet 0  . hydrochlorothiazide (HYDRODIURIL) 25 MG tablet TAKE 1 TABLET BY MOUTH EVERY DAY (Patient not taking: Reported on 07/27/2015) 30 tablet 1  . lisinopril (PRINIVIL) 10 MG tablet Take 1 tablet (10 mg total) by mouth at bedtime. (Patient not taking: Reported on 07/27/2015) 30 tablet 9   No current facility-administered medications for this visit.   Facility-Administered Medications Ordered in Other Visits  Medication Dose Route Frequency Provider Last Rate Last Dose  . 0.9 %  sodium chloride infusion   Intravenous  Continuous [name], MD 20 mL/hr at 07/27/15 1327    . gemcitabine (GEMZAR) 1,672 mg in sodium chloride 0.9 % 100 mL chemo infusion  700 mg/m2 (Treatment Plan Actual) Intravenous Once [name], MD 288 mL/hr at 07/27/15 1437 1,672 mg at 07/27/15 1437  . heparin lock flush 100 unit/mL  500 Units Intracatheter Once PRN [name], MD      . sodium chloride flush (NS) 0.9 % injection 10 mL  10 mL Intracatheter PRN [name], MD        PHYSICAL EXAMINATION: ECOG PERFORMANCE STATUS: 1 - Symptomatic but completely ambulatory  Filed Vitals:   07/27/15 1151  BP: 140/75  Pulse: 77  Temp: 98.6 F (37 C)  Resp: 18   Filed Weights   07/27/15 1151  Weight: 231 lb 9.6 oz (105.053 kg)    GENERAL:alert, no distress and comfortable. She looks Cushingoid SKIN: skin color, texture, turgor are normal, no rashes or significant lesions EYES: normal, Conjunctiva are pink and non-injected, sclera clear OROPHARYNX:no exudate, no erythema and lips, buccal mucosa, and tongue normal  NECK: supple, thyroid normal size, non-tender, without nodularity LYMPH:  no palpable lymphadenopathy in the cervical, axillary or inguinal LUNGS: clear to auscultation and percussion with normal breathing effort HEART: regular rate & rhythm and no murmurs and no lower extremity edema ABDOMEN:abdomen soft, non-tender  and normal bowel sounds Musculoskeletal:no cyanosis of digits and no clubbing  NEURO: alert & oriented x 3 with fluent speech, no focal motor/sensory deficits  LABORATORY DATA:  I have reviewed the data as listed    Component Value Date/Time   NA 143 07/27/2015 1110   NA 138 06/24/2014 1422   K 3.9 07/27/2015 1110   K 4.0 06/24/2014 1422   CL 96* 06/24/2014 1422   CO2 26 07/27/2015 1110   CO2 30 06/24/2014 1422   GLUCOSE 126 07/27/2015 1110   GLUCOSE 151* 06/24/2014 1422   BUN 12.1 07/27/2015 1110   BUN 13 06/24/2014 1422   CREATININE 0.9 07/27/2015 1110   CREATININE 1.54* 06/24/2014 1422    CALCIUM 9.7 07/27/2015 1110   CALCIUM 9.1 06/24/2014 1422   PROT 7.7 07/27/2015 1110   PROT 7.5 12/13/2013 2018   ALBUMIN 3.4* 07/27/2015 1110   ALBUMIN 3.7 12/13/2013 2018   AST 12 07/27/2015 1110   AST 17 12/13/2013 2018   ALT 9 07/27/2015 1110   ALT 14 12/13/2013 2018   ALKPHOS 93 07/27/2015 1110   ALKPHOS 80 12/13/2013 2018   BILITOT <0.30 07/27/2015 1110   BILITOT 0.3 12/13/2013 2018   GFRNONAA 36* 06/24/2014 1422   GFRAA 41* 06/24/2014 1422    No results found for: SPEP, UPEP  Lab Results  Component Value Date   WBC 7.1 07/27/2015   NEUTROABS 5.2 07/27/2015   HGB 10.8* 07/27/2015   HCT 35.2 07/27/2015   MCV 100.3 07/27/2015   PLT 275 07/27/2015      Chemistry      Component Value Date/Time   NA 143 07/27/2015 1110   NA 138 06/24/2014 1422   K 3.9 07/27/2015 1110   K 4.0 06/24/2014 1422   CL 96* 06/24/2014 1422   CO2 26 07/27/2015 1110   CO2 30 06/24/2014 1422   BUN 12.1 07/27/2015 1110   BUN 13 06/24/2014 1422   CREATININE 0.9 07/27/2015 1110   CREATININE 1.54* 06/24/2014 1422      Component Value Date/Time   CALCIUM 9.7 07/27/2015 1110   CALCIUM 9.1 06/24/2014 1422   ALKPHOS 93 07/27/2015 1110   ALKPHOS 80 12/13/2013 2018   AST 12 07/27/2015 1110   AST 17 12/13/2013 2018   ALT 9 07/27/2015 1110   ALT 14 12/13/2013 2018   BILITOT <0.30 07/27/2015 1110   BILITOT 0.3 12/13/2013 2018       ASSESSMENT & PLAN:  Adenocarcinoma of upper lobe of right lung The patient tolerated last treatment well without significant side effects such as nausea. I will proceed to reduce 30% of chemotherapy on both agents, continue current anti-emetics and also G-CSF support after day 8 treatment.  I recommend restaging after this cycle of treatment  Anemia due to antineoplastic chemotherapy This is likely due to recent treatment. The patient denies recent history of bleeding such as epistaxis, hematuria or hematochezia. She is asymptomatic from the anemia. I will  observe for now.  She does not require transfusion now. I will continue the chemotherapy at current dose without dosage adjustment.     Acute bronchitis due to infection She has signs of infections with productive cough. I do not feel it is necessary to delay chemo I will cover her with a course of oral antibiotics and Neulasta injection on day 8  Neuropathy due to chemotherapeutic drug [facility]) she has mild peripheral neuropathy, likely related to side effects of treatment. It is only mild, not bothering the patient. I will  observe for now If it gets worse in the future, I will consider modifying the dose of the treatment   Essential hypertension Her blood pressure fluctuated widely with therapy. Continue current treatment.   Orders Placed This Encounter  Procedures  . CT CHEST W CONTRAST    Standing Status: Future     Number of Occurrences:      Standing Expiration Date: 08/30/2016    Order Specific Question:  Reason for exam:    Answer:  staging lung cancer assess response to Rx    Order Specific Question:  Preferred imaging location?    Answer:  [facility]  . CT ABDOMEN PELVIS W CONTRAST    Standing Status: Future     Number of Occurrences:      Standing Expiration Date: 10/26/2016    Order Specific Question:  Reason for Exam (SYMPTOM  OR DIAGNOSIS REQUIRED)    Answer:  staging lung cancer assess response to Rx    Order Specific Question:  Preferred imaging location?    Answer:  [facility]   All questions were answered. The patient knows to call the clinic with any problems, questions or concerns. No barriers to learning was detected. I spent 25 minutes counseling the patient face to face. The total time spent in the appointment was 30 minutes and more than 50% was on counseling and review of test results     [facility], [place], MD 07/27/2015 2:39 PM

## 2015-07-27 NOTE — Assessment & Plan Note (Signed)
Her blood pressure fluctuated widely with therapy. Continue current treatment.

## 2015-07-27 NOTE — Patient Instructions (Signed)
[facility] Discharge Instructions for Patients Receiving Chemotherapy  Today you received the following chemotherapy agents :  Navelbine,  Gemcitabine.  To help prevent nausea and vomiting after your treatment, we encourage you to take your nausea medication as prescribed.   If you develop nausea and vomiting that is not controlled by your nausea medication, call the clinic.   BELOW ARE SYMPTOMS THAT SHOULD BE REPORTED IMMEDIATELY:  *FEVER GREATER THAN 100.5 F  *CHILLS WITH OR WITHOUT FEVER  NAUSEA AND VOMITING THAT IS NOT CONTROLLED WITH YOUR NAUSEA MEDICATION  *UNUSUAL SHORTNESS OF BREATH  *UNUSUAL BRUISING OR BLEEDING  TENDERNESS IN MOUTH AND THROAT WITH OR WITHOUT PRESENCE OF ULCERS  *URINARY PROBLEMS  *BOWEL PROBLEMS  UNUSUAL RASH Items with * indicate a potential emergency and should be followed up as soon as possible.  Feel free to call the clinic you have any questions or concerns. The clinic phone number is [phone].  Please show the [facility] at check-in to the Emergency Department and triage nurse.

## 2015-07-27 NOTE — Patient Instructions (Signed)

## 2015-07-27 NOTE — Assessment & Plan Note (Signed)
The patient tolerated last treatment well without significant side effects such as nausea. I will proceed to reduce 30% of chemotherapy on both agents, continue current anti-emetics and also G-CSF support after day 8 treatment.  I recommend restaging after this cycle of treatment

## 2015-07-27 NOTE — Assessment & Plan Note (Signed)
This is likely due to recent treatment. The patient denies recent history of bleeding such as epistaxis, hematuria or hematochezia. She is asymptomatic from the anemia. I will observe for now.  She does not require transfusion now. I will continue the chemotherapy at current dose without dosage adjustment.

## 2015-07-27 NOTE — Assessment & Plan Note (Signed)
she has mild peripheral neuropathy, likely related to side effects of treatment. It is only mild, not bothering the patient. I will observe for now If it gets worse in the future, I will consider modifying the dose of the treatment  

## 2015-07-27 NOTE — Telephone Encounter (Signed)
Gave and printed appt sched and avs for pt for July...gv barium

## 2015-08-03 ENCOUNTER — Ambulatory Visit: Payer: BLUE CROSS/BLUE SHIELD

## 2015-08-03 ENCOUNTER — Ambulatory Visit (HOSPITAL_BASED_OUTPATIENT_CLINIC_OR_DEPARTMENT_OTHER): Payer: BLUE CROSS/BLUE SHIELD

## 2015-08-03 ENCOUNTER — Ambulatory Visit: Payer: BLUE CROSS/BLUE SHIELD | Admitting: Nutrition

## 2015-08-03 ENCOUNTER — Other Ambulatory Visit (HOSPITAL_BASED_OUTPATIENT_CLINIC_OR_DEPARTMENT_OTHER): Payer: BLUE CROSS/BLUE SHIELD

## 2015-08-03 VITALS — BP 131/77 | HR 70 | Temp 98.1°F | Resp 18

## 2015-08-03 DIAGNOSIS — Z5111 Encounter for antineoplastic chemotherapy: Secondary | ICD-10-CM | POA: Diagnosis not present

## 2015-08-03 DIAGNOSIS — C3411 Malignant neoplasm of upper lobe, right bronchus or lung: Secondary | ICD-10-CM

## 2015-08-03 LAB — CBC WITH DIFFERENTIAL/PLATELET
BASO%: 0.5 % (ref 0.0–2.0)
BASOS ABS: 0 10*3/uL (ref 0.0–0.1)
EOS ABS: 0 10*3/uL (ref 0.0–0.5)
EOS%: 0.9 % (ref 0.0–7.0)
HCT: 33.1 % — ABNORMAL LOW (ref 34.8–46.6)
HGB: 10.6 g/dL — ABNORMAL LOW (ref 11.6–15.9)
LYMPH%: 14.7 % (ref 14.0–49.7)
MCH: 30.9 pg (ref 25.1–34.0)
MCHC: 31.9 g/dL (ref 31.5–36.0)
MCV: 96.9 fL (ref 79.5–101.0)
MONO#: 0.3 10*3/uL (ref 0.1–0.9)
MONO%: 6.2 % (ref 0.0–14.0)
NEUT#: 3.7 10*3/uL (ref 1.5–6.5)
NEUT%: 77.7 % — ABNORMAL HIGH (ref 38.4–76.8)
Platelets: 139 10*3/uL — ABNORMAL LOW (ref 145–400)
RBC: 3.41 10*6/uL — AB (ref 3.70–5.45)
RDW: 19 % — ABNORMAL HIGH (ref 11.2–14.5)
WBC: 4.7 10*3/uL (ref 3.9–10.3)
lymph#: 0.7 10*3/uL — ABNORMAL LOW (ref 0.9–3.3)

## 2015-08-03 LAB — COMPREHENSIVE METABOLIC PANEL
ALT: 15 U/L (ref 0–55)
AST: 15 U/L (ref 5–34)
Albumin: 3.4 g/dL — ABNORMAL LOW (ref 3.5–5.0)
Alkaline Phosphatase: 83 U/L (ref 40–150)
Anion Gap: 12 mEq/L — ABNORMAL HIGH (ref 3–11)
BUN: 14.2 mg/dL (ref 7.0–26.0)
CO2: 25 meq/L (ref 22–29)
Calcium: 9.7 mg/dL (ref 8.4–10.4)
Chloride: 105 mEq/L (ref 98–109)
Creatinine: 0.9 mg/dL (ref 0.6–1.1)
EGFR: 69 mL/min/{1.73_m2} — AB (ref >90)
GLUCOSE: 127 mg/dL (ref 70–140)
POTASSIUM: 4 meq/L (ref 3.5–5.1)
SODIUM: 142 meq/L (ref 136–145)
Total Bilirubin: <0.3 mg/dL (ref 0.20–1.20)
Total Protein: 7.6 g/dL (ref 6.4–8.3)

## 2015-08-03 MED ORDER — PALONOSETRON HCL INJECTION 0.25 MG/5ML
INTRAVENOUS | Status: AC
Start: 2015-08-03 — End: 2015-08-03
  Filled 2015-08-03: qty 5

## 2015-08-03 MED ORDER — SODIUM CHLORIDE 0.9 % IV SOLN
INTRAVENOUS | Status: DC
  Administered 2015-08-03: 12:00:00 via INTRAVENOUS

## 2015-08-03 MED ORDER — SODIUM CHLORIDE 0.9% FLUSH
10.0000 mL | INTRAVENOUS | Status: DC | PRN
  Administered 2015-08-03: 10 mL
  Filled 2015-08-03: qty 10

## 2015-08-03 MED ORDER — PALONOSETRON HCL INJECTION 0.25 MG/5ML
0.2500 mg | Freq: Once | INTRAVENOUS | Status: AC
  Administered 2015-08-03: 0.25 mg via INTRAVENOUS

## 2015-08-03 MED ORDER — SODIUM CHLORIDE 0.9 % IV SOLN
Freq: Once | INTRAVENOUS | Status: AC
  Administered 2015-08-03: 13:00:00 via INTRAVENOUS
  Filled 2015-08-03: qty 5

## 2015-08-03 MED ORDER — SODIUM CHLORIDE 0.9 % IJ SOLN
10.0000 mL | INTRAMUSCULAR | Status: DC | PRN
  Administered 2015-08-03: 10 mL via INTRAVENOUS
  Filled 2015-08-03: qty 10

## 2015-08-03 MED ORDER — HEPARIN SOD (PORK) LOCK FLUSH 100 UNIT/ML IV SOLN
500.0000 [IU] | Freq: Once | INTRAVENOUS | Status: AC | PRN
  Administered 2015-08-03: 500 [IU]
  Filled 2015-08-03: qty 5

## 2015-08-03 MED ORDER — SODIUM CHLORIDE 0.9 % IV SOLN
700.0000 mg/m2 | Freq: Once | INTRAVENOUS | Status: AC
  Administered 2015-08-03: 1672 mg via INTRAVENOUS
  Filled 2015-08-03: qty 43.97

## 2015-08-03 MED ORDER — VINORELBINE TARTRATE CHEMO INJECTION 50 MG/5ML
17.0000 mg/m2 | Freq: Once | INTRAVENOUS | Status: AC
  Administered 2015-08-03: 40 mg via INTRAVENOUS
  Filled 2015-08-03: qty 4

## 2015-08-03 NOTE — Patient Instructions (Signed)
[facility] Discharge Instructions for Patients Receiving Chemotherapy  Today you received the following chemotherapy agents :  Navelbine,  Gemcitabine.  To help prevent nausea and vomiting after your treatment, we encourage you to take your nausea medication as prescribed.   If you develop nausea and vomiting that is not controlled by your nausea medication, call the clinic.   BELOW ARE SYMPTOMS THAT SHOULD BE REPORTED IMMEDIATELY:  *FEVER GREATER THAN 100.5 F  *CHILLS WITH OR WITHOUT FEVER  NAUSEA AND VOMITING THAT IS NOT CONTROLLED WITH YOUR NAUSEA MEDICATION  *UNUSUAL SHORTNESS OF BREATH  *UNUSUAL BRUISING OR BLEEDING  TENDERNESS IN MOUTH AND THROAT WITH OR WITHOUT PRESENCE OF ULCERS  *URINARY PROBLEMS  *BOWEL PROBLEMS  UNUSUAL RASH Items with * indicate a potential emergency and should be followed up as soon as possible.  Feel free to call the clinic you have any questions or concerns. The clinic phone number is [phone].  Please show the [facility] at check-in to the Emergency Department and triage nurse.

## 2015-08-03 NOTE — Patient Instructions (Signed)

## 2015-08-03 NOTE — Progress Notes (Signed)
Brief follow-up completed with patient during infusion.  Patient reports she feels well and is eating normally. She denies nutrition impact symptoms. Currently, edema is not a problem. Weight improved documented as 231.6 pounds June 28.  Nutrition diagnosis: Food and nutrition related knowledge deficit has resolved.  Encouraged patient to continue to consume adequate calories and protein for weight maintenance. Patient will contact me with questions or concerns.  **Disclaimer: This note was dictated with voice recognition software. Similar sounding words can inadvertently be transcribed and this note may contain transcription errors which may not have been corrected upon publication of note.**

## 2015-08-04 ENCOUNTER — Ambulatory Visit (HOSPITAL_BASED_OUTPATIENT_CLINIC_OR_DEPARTMENT_OTHER): Payer: BLUE CROSS/BLUE SHIELD

## 2015-08-04 VITALS — BP 155/85 | HR 95 | Temp 98.2°F | Resp 20

## 2015-08-04 DIAGNOSIS — C3411 Malignant neoplasm of upper lobe, right bronchus or lung: Secondary | ICD-10-CM

## 2015-08-04 DIAGNOSIS — Z5189 Encounter for other specified aftercare: Secondary | ICD-10-CM | POA: Diagnosis not present

## 2015-08-04 MED ORDER — ALTEPLASE 2 MG IJ SOLR
2.0000 mg | Freq: Once | INTRAMUSCULAR | Status: DC | PRN
Start: 2015-08-04 — End: 2015-08-04
  Filled 2015-08-04: qty 2

## 2015-08-04 MED ORDER — PEGFILGRASTIM INJECTION 6 MG/0.6ML
6.0000 mg | Freq: Once | SUBCUTANEOUS | Status: AC
  Administered 2015-08-04: 6 mg via SUBCUTANEOUS
  Filled 2015-08-04: qty 0.6

## 2015-08-04 MED ORDER — HEPARIN SOD (PORK) LOCK FLUSH 100 UNIT/ML IV SOLN
500.0000 [IU] | Freq: Once | INTRAVENOUS | Status: DC | PRN
  Filled 2015-08-04: qty 5

## 2015-08-04 NOTE — Patient Instructions (Signed)
Pegfilgrastim injection What is this medicine? PEGFILGRASTIM (PEG fil gra stim) is a long-acting granulocyte colony-stimulating factor that stimulates the growth of neutrophils, a type of white blood cell important in the body's fight against infection. It is used to reduce the incidence of fever and infection in patients with certain types of cancer who are receiving chemotherapy that affects the bone marrow, and to increase survival after being exposed to high doses of radiation. This medicine may be used for other purposes; ask your health care provider or pharmacist if you have questions. What should I tell my health care provider before I take this medicine? They need to know if you have any of these conditions: -kidney disease -latex allergy -ongoing radiation therapy -sickle cell disease -skin reactions to acrylic adhesives (On-Body Injector only) -an unusual or allergic reaction to pegfilgrastim, filgrastim, other medicines, foods, dyes, or preservatives -pregnant or trying to get pregnant -breast-feeding How should I use this medicine? This medicine is for injection under the skin. If you get this medicine at home, you will be taught how to prepare and give the pre-filled syringe or how to use the On-body Injector. Refer to the patient Instructions for Use for detailed instructions. Use exactly as directed. Take your medicine at regular intervals. Do not take your medicine more often than directed. It is important that you put your used needles and syringes in a special sharps container. Do not put them in a trash can. If you do not have a sharps container, call your pharmacist or healthcare provider to get one. Talk to your pediatrician regarding the use of this medicine in children. While this drug may be prescribed for selected conditions, precautions do apply. Overdosage: If you think you have taken too much of this medicine contact a poison control center or emergency room at  once. NOTE: This medicine is only for you. Do not share this medicine with others. What if I miss a dose? It is important not to miss your dose. Call your doctor or health care professional if you miss your dose. If you miss a dose due to an On-body Injector failure or leakage, a new dose should be administered as soon as possible using a single prefilled syringe for manual use. What may interact with this medicine? Interactions have not been studied. Give your health care provider a list of all the medicines, herbs, non-prescription drugs, or dietary supplements you use. Also tell them if you smoke, drink alcohol, or use illegal drugs. Some items may interact with your medicine. This list may not describe all possible interactions. Give your health care provider a list of all the medicines, herbs, non-prescription drugs, or dietary supplements you use. Also tell them if you smoke, drink alcohol, or use illegal drugs. Some items may interact with your medicine. What should I watch for while using this medicine? You may need blood work done while you are taking this medicine. If you are going to need a MRI, CT scan, or other procedure, tell your doctor that you are using this medicine (On-Body Injector only). What side effects may I notice from receiving this medicine? Side effects that you should report to your doctor or health care professional as soon as possible: -allergic reactions like skin rash, itching or hives, swelling of the face, lips, or tongue -dizziness -fever -pain, redness, or irritation at site where injected -pinpoint red spots on the skin -red or dark-brown urine -shortness of breath or breathing problems -stomach or side pain, or pain   at the shoulder -swelling -tiredness -trouble passing urine or change in the amount of urine Side effects that usually do not require medical attention (report to your doctor or health care professional if they continue or are  bothersome): -bone pain -muscle pain This list may not describe all possible side effects. Call your doctor for medical advice about side effects. You may report side effects to FDA at 1-800-FDA-1088. Where should I keep my medicine? Keep out of the reach of children. Store pre-filled syringes in a refrigerator between 2 and 8 degrees C (36 and 46 degrees F). Do not freeze. Keep in carton to protect from light. Throw away this medicine if it is left out of the refrigerator for more than 48 hours. Throw away any unused medicine after the expiration date. NOTE: This sheet is a summary. It may not cover all possible information. If you have questions about this medicine, talk to your doctor, pharmacist, or health care provider.    2016, Elsevier/Gold Standard. (2014-02-04 14:30:14)  

## 2015-08-12 ENCOUNTER — Telehealth: Payer: Self-pay | Admitting: Emergency Medicine

## 2015-08-12 MED ORDER — LEVOFLOXACIN 500 MG PO TABS: 500.0000 mg | ORAL_TABLET | Freq: Every day | ORAL | Status: DC

## 2015-08-12 MED ORDER — PREDNISONE 10 MG PO TABS: ORAL_TABLET | ORAL | Status: DC

## 2015-08-12 NOTE — Telephone Encounter (Signed)
Called and spoke with pt and she stated that she went to the beach and when she came back she was seen by oncologist and they gave her zpak around June 23.  She felt better for a few days after that but now she is coughing again with yellow/green sputum, chest is burning, increase SOB and headache from all the coughing along with body aches.  She has been using mucinex and cough syrup with no relief.  Denies any fever.  Pt requesting that something else be called in for her.  Please advise RB.    Allergies  Allergen Reactions  . Codeine Nausea And Vomiting and Other (See Comments)    Any medication that is a codeine derivative like Hydrocodone, etc.  . Penicillins     Unknown reaction as a child  . Sulfa Antibiotics Nausea And Vomiting

## 2015-08-12 NOTE — Telephone Encounter (Signed)
Spoke with pt. She is aware of RB's recommendations. Rxs have been sent in. Nothing further was needed. 

## 2015-08-12 NOTE — Telephone Encounter (Signed)
OK to call in meds as below, but we also need to arrange for her to be seen next week by RB or APP w a CXR - she is at risk for progression of her lung cancer, may be contributing to this illness  levaquin '500mg'$  qd x 7 days Pred taper > Take '40mg'$  daily for 3 days, then '30mg'$  daily for 3 days, then '20mg'$  daily for 3 days, then '10mg'$  daily for 3 days, then stop

## 2015-08-15 ENCOUNTER — Encounter: Payer: Self-pay | Admitting: Acute Care

## 2015-08-15 ENCOUNTER — Ambulatory Visit (INDEPENDENT_AMBULATORY_CARE_PROVIDER_SITE_OTHER): Payer: BLUE CROSS/BLUE SHIELD | Admitting: Acute Care

## 2015-08-15 ENCOUNTER — Ambulatory Visit (INDEPENDENT_AMBULATORY_CARE_PROVIDER_SITE_OTHER)
Admission: RE | Admit: 2015-08-15 | Discharge: 2015-08-15 | Disposition: A | Discharge status: Home / Self Care | Payer: BLUE CROSS/BLUE SHIELD | Source: Ambulatory Visit | Attending: Acute Care | Admitting: Acute Care

## 2015-08-15 VITALS — BP 128/82 | HR 79 | Ht 74.0 in | Wt 231.0 lb

## 2015-08-15 DIAGNOSIS — R05 Cough: Secondary | ICD-10-CM

## 2015-08-15 DIAGNOSIS — R059 Cough, unspecified: Secondary | ICD-10-CM

## 2015-08-15 DIAGNOSIS — J449 Chronic obstructive pulmonary disease, unspecified: Secondary | ICD-10-CM

## 2015-08-15 LAB — NITRIC OXIDE: Nitric Oxide: 7

## 2015-08-15 MED ORDER — LEVALBUTEROL HCL 0.63 MG/3ML IN NEBU
0.6300 mg | INHALATION_SOLUTION | Freq: Once | RESPIRATORY_TRACT | Status: AC
  Administered 2015-08-15: 0.63 mg via RESPIRATORY_TRACT

## 2015-08-15 NOTE — Assessment & Plan Note (Signed)
New dyspnea with purulent secretions. Interval increase in RUL mass per CXR today. Scheduled for CT scan 08/17/2015 to evaluate progression of disease. Plan: Have CT scan you have done Wednesday ( 08/17/2015 sent to Dr. Lamonte Sakai for review.  We will walk you in the office today to see if you desaturate with exertion.. If you qualify for oxygen we will order it from your DME. Continue the Levaquin and Prednisone until gone. We will instruct you on use of your Albuterol inhaler. You can use your Proventil for shortness of breath of wheezing up to every 4 hours.. We will give you a nebulizer treatment in the office today I want you to try the Breo inhaler again as maintenance treatment. Breo 1 puff once daily in the morning. Remember to rinse your mouth after use. If [facility]  makes your shortness of breath worse, the stop taking it and let us know. Follow up appointment in 2-3  weeks with Dr. Lamonte Sakai or myself. Please contact office for sooner follow up if symptoms do not improve or worsen or seek emergency care  Additional considerations: Dr. Lamonte Sakai to review CT pending for 08/17/2015 to eval for airway compression with enlarging RUL mass. Consider [facility]osp[facility]

## 2015-08-15 NOTE — Progress Notes (Signed)
History of Present Illness Jasmine Grant is a 60 y.o. 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 Lamonte Sakai.  Pertinent Information:  Stage IV adenocarcinoma of the lung made in November 2015. The presumed primary lesion was a large right upper lobe mass compared by metastatic disease to bone and pretracheal lymph nodes. Foundation 1 testing did not show a treatable mutation. She received radiation therapy to the mediastinum, bone. She underwent carboplatin and Taxol with good response until a PET scan 07/07/14 showed progression of disease. Palliative chemotherapy was initiated at that time, as well as further radiation therapy to bone in July 2016  7/17/2017Acute OV: Pt. Presents to the office today for acute visit. She was last seen in the office 10/2014. Patient  stated that she went to the beach and when she came back she was seen by oncologist and they gave her zpak around June 23. She felt better for a few days after that but now she is coughing again with yellow/green sputum, chest is burning, increase SOB and headache from all the coughing along with body aches. She has been using mucinex and cough syrup with no relief. Denies any fever.Br. Byrum called in a prescription for Levaquin and a prednisone taper on 08/12/2015. She returns to the office today for CXR and follow up.She has been compliant with her Levaquin and prednisone.She reports much worsening dyspnea for the last week. The dyspnea is with exertion or talking. She is not awakened at night and is sleeping well.We discussed that there has been growth to the right upper lobe mass despite chemotherapy.The Breo that she was started on last year did not help, so she is not using it.She is wheezing at times. She is not having fever, chest pain, orthopnea, or hemoptysis.We discussed that her CXR today indicated that the Right Upper Lobe mass has had an interval  increase in size despite chemotherapy.We also discussed the option of palliation or Hospice when she feels the time is right for comfort care. She currently wants to try some of the other chemo options available. She states that there are 2 more options, and that they are harder to tolerate, but she is willing to try them.She feels this is in God's hands.She was walked in the office today on room ar and she did not desaturate.   Tests: 08/15/2015:  FENO:= 7  CXR 08/15/2015: IMPRESSION: There is progression of the mass in right upper lobe medially measures at least 4.9 cm. Further correlation with CT scan is recommended. Bilateral pulmonary nodules are again identified.  CT Chest 04/19/2015  IMPRESSION: 1. Today's study demonstrates apparent progression of disease, as evidenced by enlargement of the primary mass in the paramediastinal aspect of the right upper lobe, enlargement of the numerous previously demonstrated bilateral pulmonary nodules, and progressively worsening right hilar and mediastinal lymphadenopathy, as detailed above. 2. Additional incidental findings, as above.    Past medical hx Past Medical History  Diagnosis Date  . Panic attack   . Hx of thyroid cancer     10 years ago  . Insomnia 12/29/2013  . S/P radiation therapy completed 12/16/13-02/05/14    RULL lung  . Lung cancer ([place]) 12/14/13    Adenocarcinoma of right upper lobe mediastinal mass and bone   . Arthritis 04/27/2014  . Essential hypertension 04/27/2014  . 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 Dysuria 11/03/2014  . UTI (urinary tract infection) 04/29/2015  .  UTI (lower urinary tract infection) 05/26/2015  . Drug-induced hypokalemia 05/26/2015  . Headache disorder 06/08/2015     Past surgical hx, Family hx, Social hx all reviewed.  Current Outpatient Prescriptions on File Prior to Visit  Medication Sig  . albuterol (PROVENTIL HFA;VENTOLIN HFA) 108 (90 BASE) MCG/ACT inhaler Inhale 2 puffs into the lungs  every 4 (four) hours as needed for wheezing or shortness of breath.  Marland Kitchen amLODipine (NORVASC) 10 MG tablet Take 1 tablet (10 mg total) by mouth at bedtime.  . fluticasone (FLONASE) 50 MCG/ACT nasal spray Place 2 sprays into both nostrils daily.  . Fluticasone Furoate-Vilanterol (BREO ELLIPTA) 200-25 MCG/INH AEPB Inhale 1 puff into the lungs daily. Reported on 04/20/2015  . hydrochlorothiazide (HYDRODIURIL) 25 MG tablet TAKE 1 TABLET BY MOUTH EVERY DAY  . levofloxacin (LEVAQUIN) 500 MG tablet Take 1 tablet (500 mg total) by mouth daily.  Marland Kitchen levothyroxine (SYNTHROID, LEVOTHROID) 200 MCG tablet TAKE 1 TABLET BY MOUTH DAILY BEFORE BREAKFAST.  Marland Kitchen lidocaine-prilocaine (EMLA) cream Apply 1 application topically as needed.  Marland Kitchen lisinopril (PRINIVIL) 10 MG tablet Take 1 tablet (10 mg total) by mouth at bedtime.  Marland Kitchen LORazepam (ATIVAN) 0.5 MG tablet TAKE 1 TABLET BY MOUTH EVERY 8 HOURS AS NEEDED FOR ANXIETY OR FOR NAUSEA  . mirtazapine (REMERON) 15 MG tablet TAKE 1 TABLET (15 MG TOTAL) BY MOUTH AT BEDTIME AS NEEDED (FOR SLEEP.).  Marland Kitchen ondansetron (ZOFRAN) 8 MG tablet TAKE 1 TABLET BY MOUTH EVERY 6 HOURS AS NEEDED FOR NAUSEA.  . predniSONE (DELTASONE) 10 MG tablet Take 4 tablets for 3 days, 3 tablets for 3 days, 2 tablets for 3 days, 1 tablet for 3 days  . prochlorperazine (COMPAZINE) 10 MG tablet TAKE 1 TABLET BY MOUTH EVERY 6 (SIX) HOURS AS NEEDED FOR NAUSEA OR VOMITING.  . ranitidine (ZANTAC) 150 MG tablet Take 1 tablet (150 mg total) by mouth every evening.  . sucralfate (CARAFATE) 1 G tablet Take 1 g by mouth 4 (four) times daily. Reported on 01/12/2015   No current facility-administered medications on file prior to visit.     Allergies  Allergen Reactions  . Codeine Nausea And Vomiting and Other (See Comments)    Any medication that is a codeine derivative like Hydrocodone, etc.  . Penicillins     Unknown reaction as a child  . Sulfa Antibiotics Nausea And Vomiting    Review Of  Systems:  Constitutional:   No  weight loss, night sweats,  Fevers, chills, fatigue, or  lassitude.  HEENT:   No headaches,  Difficulty swallowing,  Tooth/dental problems, or  Sore throat,                No sneezing, itching, ear ache, nasal congestion, post nasal drip,   CV:  No chest pain,  Orthopnea, PND, swelling in lower extremities, anasarca, dizziness, palpitations, syncope.   GI  No heartburn, indigestion, abdominal pain, nausea, vomiting, diarrhea, change in bowel habits, loss of appetite, bloody stools.   Resp: + shortness of breath with exertion less  at rest.  + excess mucus, + productive cough,  No non-productive cough,  No coughing up of blood.  + change in color of mucus.  + wheezing.  No chest wall deformity  Skin: no rash or lesions.  GU: no dysuria, change in color of urine, no urgency or frequency.  No flank pain, no hematuria   MS:  No joint pain or swelling.  No decreased range of motion.  No back pain.  Psych:  No change in mood or affect. No depression or anxiety.  No memory loss.   Vital Signs BP 128/82 mmHg  Pulse 79  Ht '6\' 2"'$  (1.88 m)  Wt 231 lb (104.781 kg)  BMI 29.65 kg/m2  SpO2 96%   Physical Exam:  General- No distress,  A&Ox3, very pleasant ENT: No sinus tenderness, TM clear, pale nasal mucosa, no oral exudate,no post nasal drip, no LAN Cardiac: S1, S2, regular rate and rhythm, no murmur Chest: + wheeze/ no rales/ dullness; no accessory muscle use, no nasal flaring, no sternal retractions Abd.: Soft Non-tender Ext: No clubbing cyanosis, edema Neuro:  normal strength Skin: No rashes, warm and dry Psych: normal mood and behavior although upset at progression of disease.   Assessment/Plan  COPD (chronic obstructive pulmonary disease) (HCC) New dyspnea with purulent secretions. Interval increase in RUL mass per CXR today. Scheduled for CT scan 08/17/2015 to evaluate progression of disease. Plan: Have CT scan you have done Wednesday (  08/17/2015 sent to Dr. Lamonte Sakai for review.  We will walk you in the office today to see if you desaturate with exertion.. If you qualify for oxygen we will order it from your DME. Continue the Levaquin and Prednisone until gone. We will instruct you on use of your Albuterol inhaler. You can use your Proventil for shortness of breath of wheezing up to every 4 hours.. We will give you a nebulizer treatment in the office today I want you to try the Breo inhaler again as maintenance treatment. Breo 1 puff once daily in the morning. Remember to rinse your mouth after use. If [facility]  makes your shortness of breath worse, the stop taking it and let us know. Follow up appointment in 2-3  weeks with Dr. Lamonte Sakai or myself. Please contact office for sooner follow up if symptoms do not improve or worsen or seek emergency care  Additional considerations: Dr. Lamonte Sakai to review CT pending for 08/17/2015 to eval for airway compression with enlarging RUL mass. Consider Hospice/Palliation Care     Magdalen Spatz, NP 08/15/2015  11:00 AM

## 2015-08-15 NOTE — Patient Instructions (Addendum)
It is nice to meet you today. Have CT scan you have done Wednesday sent to Dr. Lamonte Sakai for review.  We will walk you in the office today. If you qualify for oxygen we will order it from your DME. Continue the Levaquin and Prednisone until gone. We will instruct you on use of your Albuterol inhaler. You can use your Proventil for shortness of breath of wheezing up to every 4 hours.. We will give you a nebulizer treatment in the office today I want you to try the Breo inhaler again as maintenance treatment. Breo 1 puff once daily in the morning. Remember to rinse your mouth after use. Follow up appointment in 2-3  weeks with Dr. Lamonte Sakai or myself. Please contact office for sooner follow up if symptoms do not improve or worsen or seek emergency care

## 2015-08-17 ENCOUNTER — Ambulatory Visit: Payer: BLUE CROSS/BLUE SHIELD

## 2015-08-17 ENCOUNTER — Ambulatory Visit (HOSPITAL_COMMUNITY)
Admission: RE | Admit: 2015-08-17 | Discharge: 2015-08-17 | Disposition: A | Discharge status: Home / Self Care | Payer: BLUE CROSS/BLUE SHIELD | Source: Ambulatory Visit | Attending: Hematology and Oncology | Admitting: Hematology and Oncology

## 2015-08-17 ENCOUNTER — Encounter (HOSPITAL_COMMUNITY): Payer: Self-pay

## 2015-08-17 ENCOUNTER — Other Ambulatory Visit (HOSPITAL_BASED_OUTPATIENT_CLINIC_OR_DEPARTMENT_OTHER): Payer: BLUE CROSS/BLUE SHIELD

## 2015-08-17 DIAGNOSIS — C3411 Malignant neoplasm of upper lobe, right bronchus or lung: Secondary | ICD-10-CM | POA: Diagnosis not present

## 2015-08-17 DIAGNOSIS — M87188 Osteonecrosis due to drugs, other site: Secondary | ICD-10-CM | POA: Insufficient documentation

## 2015-08-17 DIAGNOSIS — S2231XA Fracture of one rib, right side, initial encounter for closed fracture: Secondary | ICD-10-CM | POA: Diagnosis not present

## 2015-08-17 DIAGNOSIS — C7951 Secondary malignant neoplasm of bone: Secondary | ICD-10-CM | POA: Diagnosis not present

## 2015-08-17 DIAGNOSIS — J700 Acute pulmonary manifestations due to radiation: Secondary | ICD-10-CM | POA: Insufficient documentation

## 2015-08-17 DIAGNOSIS — M5136 Other intervertebral disc degeneration, lumbar region: Secondary | ICD-10-CM | POA: Insufficient documentation

## 2015-08-17 DIAGNOSIS — Y842 Radiological procedure and radiotherapy as the cause of abnormal reaction of the patient, or of later complication, without mention of misadventure at the time of the procedure: Secondary | ICD-10-CM | POA: Insufficient documentation

## 2015-08-17 DIAGNOSIS — R918 Other nonspecific abnormal finding of lung field: Secondary | ICD-10-CM | POA: Insufficient documentation

## 2015-08-17 HISTORY — DX: Malignant neoplasm of thyroid gland: C73

## 2015-08-17 LAB — COMPREHENSIVE METABOLIC PANEL
ALT: 22 U/L (ref 0–55)
AST: 16 U/L (ref 5–34)
Albumin: 3.6 g/dL (ref 3.5–5.0)
Alkaline Phosphatase: 111 U/L (ref 40–150)
Anion Gap: 11 mEq/L (ref 3–11)
BUN: 19.5 mg/dL (ref 7.0–26.0)
CALCIUM: 9.3 mg/dL (ref 8.4–10.4)
CHLORIDE: 104 meq/L (ref 98–109)
CO2: 27 mEq/L (ref 22–29)
CREATININE: 0.9 mg/dL (ref 0.6–1.1)
EGFR: 66 mL/min/{1.73_m2} — ABNORMAL LOW (ref >90)
GLUCOSE: 91 mg/dL (ref 70–140)
POTASSIUM: 4 meq/L (ref 3.5–5.1)
SODIUM: 142 meq/L (ref 136–145)
Total Bilirubin: <0.3 mg/dL (ref 0.20–1.20)
Total Protein: 7.3 g/dL (ref 6.4–8.3)

## 2015-08-17 LAB — CBC WITH DIFFERENTIAL/PLATELET
BASO%: 0.2 % (ref 0.0–2.0)
Basophils Absolute: 0 10*3/uL (ref 0.0–0.1)
EOS%: 0.1 % (ref 0.0–7.0)
Eosinophils Absolute: 0 10*3/uL (ref 0.0–0.5)
HEMATOCRIT: 33.7 % — AB (ref 34.8–46.6)
HGB: 10.6 g/dL — ABNORMAL LOW (ref 11.6–15.9)
LYMPH%: 8.5 % — AB (ref 14.0–49.7)
MCH: 31.3 pg (ref 25.1–34.0)
MCHC: 31.5 g/dL (ref 31.5–36.0)
MCV: 99.4 fL (ref 79.5–101.0)
MONO#: 1.3 10*3/uL — AB (ref 0.1–0.9)
MONO%: 7.9 % (ref 0.0–14.0)
NEUT#: 14.1 10*3/uL — ABNORMAL HIGH (ref 1.5–6.5)
NEUT%: 83.3 % — AB (ref 38.4–76.8)
Platelets: 226 10*3/uL (ref 145–400)
RBC: 3.39 10*6/uL — ABNORMAL LOW (ref 3.70–5.45)
RDW: 19.8 % — ABNORMAL HIGH (ref 11.2–14.5)
WBC: 16.9 10*3/uL — ABNORMAL HIGH (ref 3.9–10.3)
lymph#: 1.4 10*3/uL (ref 0.9–3.3)
nRBC: 1 % — ABNORMAL HIGH (ref 0–0)

## 2015-08-17 MED ORDER — ALTEPLASE 2 MG IJ SOLR
2.0000 mg | Freq: Once | INTRAMUSCULAR | Status: DC | PRN
Start: 2015-08-17 — End: 2015-08-17
  Filled 2015-08-17: qty 2

## 2015-08-17 MED ORDER — HEPARIN SOD (PORK) LOCK FLUSH 100 UNIT/ML IV SOLN
500.0000 [IU] | Freq: Once | INTRAVENOUS | Status: DC | PRN
  Filled 2015-08-17: qty 5

## 2015-08-17 MED ORDER — SODIUM CHLORIDE 0.9 % IV SOLN: Freq: Once | INTRAVENOUS | Status: DC

## 2015-08-17 MED ORDER — PROMETHAZINE HCL 25 MG/ML IJ SOLN
25.0000 mg | Freq: Once | INTRAMUSCULAR | Status: DC
  Filled 2015-08-17: qty 1

## 2015-08-17 MED ORDER — SODIUM CHLORIDE 0.9 % IJ SOLN
10.0000 mL | INTRAMUSCULAR | Status: DC | PRN
  Administered 2015-08-17: 10 mL via INTRAVENOUS
  Filled 2015-08-17: qty 10

## 2015-08-17 MED ORDER — SODIUM CHLORIDE 0.9 % IV SOLN
Freq: Once | INTRAVENOUS | Status: DC
  Filled 2015-08-17: qty 4

## 2015-08-17 MED ORDER — SODIUM CHLORIDE 0.9 % IJ SOLN
10.0000 mL | INTRAMUSCULAR | Status: DC | PRN
  Filled 2015-08-17: qty 10

## 2015-08-17 MED ORDER — HEPARIN SOD (PORK) LOCK FLUSH 100 UNIT/ML IV SOLN
250.0000 [IU] | Freq: Once | INTRAVENOUS | Status: DC | PRN
  Filled 2015-08-17: qty 5

## 2015-08-17 MED ORDER — IOPAMIDOL (ISOVUE-300) INJECTION 61%
100.0000 mL | Freq: Once | INTRAVENOUS | Status: AC | PRN
  Administered 2015-08-17: 100 mL via INTRAVENOUS

## 2015-08-17 NOTE — Progress Notes (Signed)
Ok to leave patient accessed through tomorrow for her infusion appt. Notified Dr. Pollyann Samples nurse. Pt understands to keep dressing clean and dry.

## 2015-08-18 ENCOUNTER — Encounter: Payer: Self-pay | Admitting: Hematology and Oncology

## 2015-08-18 ENCOUNTER — Ambulatory Visit (HOSPITAL_BASED_OUTPATIENT_CLINIC_OR_DEPARTMENT_OTHER): Payer: BLUE CROSS/BLUE SHIELD

## 2015-08-18 ENCOUNTER — Telehealth: Payer: Self-pay | Admitting: Hematology and Oncology

## 2015-08-18 ENCOUNTER — Ambulatory Visit (HOSPITAL_BASED_OUTPATIENT_CLINIC_OR_DEPARTMENT_OTHER): Payer: BLUE CROSS/BLUE SHIELD | Admitting: Hematology and Oncology

## 2015-08-18 VITALS — BP 154/90 | HR 89 | Temp 98.6°F | Resp 19 | Ht 74.0 in | Wt 233.4 lb

## 2015-08-18 DIAGNOSIS — Z5111 Encounter for antineoplastic chemotherapy: Secondary | ICD-10-CM | POA: Diagnosis not present

## 2015-08-18 DIAGNOSIS — J700 Acute pulmonary manifestations due to radiation: Secondary | ICD-10-CM | POA: Diagnosis not present

## 2015-08-18 DIAGNOSIS — C3411 Malignant neoplasm of upper lobe, right bronchus or lung: Secondary | ICD-10-CM

## 2015-08-18 DIAGNOSIS — I10 Essential (primary) hypertension: Secondary | ICD-10-CM | POA: Diagnosis not present

## 2015-08-18 DIAGNOSIS — T451X5A Adverse effect of antineoplastic and immunosuppressive drugs, initial encounter: Secondary | ICD-10-CM

## 2015-08-18 DIAGNOSIS — D6481 Anemia due to antineoplastic chemotherapy: Secondary | ICD-10-CM | POA: Diagnosis not present

## 2015-08-18 DIAGNOSIS — R05 Cough: Secondary | ICD-10-CM

## 2015-08-18 DIAGNOSIS — Z66 Do not resuscitate: Secondary | ICD-10-CM

## 2015-08-18 MED ORDER — VINORELBINE TARTRATE CHEMO INJECTION 50 MG/5ML
17.0000 mg/m2 | Freq: Once | INTRAVENOUS | Status: AC
  Administered 2015-08-18: 40 mg via INTRAVENOUS
  Filled 2015-08-18: qty 4

## 2015-08-18 MED ORDER — SODIUM CHLORIDE 0.9% FLUSH
10.0000 mL | INTRAVENOUS | Status: DC | PRN
  Administered 2015-08-18: 10 mL
  Filled 2015-08-18: qty 10

## 2015-08-18 MED ORDER — HEPARIN SOD (PORK) LOCK FLUSH 100 UNIT/ML IV SOLN
500.0000 [IU] | Freq: Once | INTRAVENOUS | Status: AC | PRN
  Administered 2015-08-18: 500 [IU]
  Filled 2015-08-18: qty 5

## 2015-08-18 MED ORDER — SODIUM CHLORIDE 0.9 % IV SOLN
700.0000 mg/m2 | Freq: Once | INTRAVENOUS | Status: AC
  Administered 2015-08-18: 1672 mg via INTRAVENOUS
  Filled 2015-08-18: qty 43.97

## 2015-08-18 MED ORDER — PALONOSETRON HCL INJECTION 0.25 MG/5ML
0.2500 mg | Freq: Once | INTRAVENOUS | Status: AC
  Administered 2015-08-18: 0.25 mg via INTRAVENOUS

## 2015-08-18 MED ORDER — SODIUM CHLORIDE 0.9 % IV SOLN
INTRAVENOUS | Status: DC
Start: 2015-08-18 — End: 2015-08-18
  Administered 2015-08-18: 12:00:00 via INTRAVENOUS

## 2015-08-18 MED ORDER — PALONOSETRON HCL INJECTION 0.25 MG/5ML
INTRAVENOUS | Status: AC
  Filled 2015-08-18: qty 5

## 2015-08-18 MED ORDER — SODIUM CHLORIDE 0.9 % IV SOLN
Freq: Once | INTRAVENOUS | Status: AC
  Administered 2015-08-18: 12:00:00 via INTRAVENOUS
  Filled 2015-08-18: qty 5

## 2015-08-18 NOTE — Assessment & Plan Note (Signed)
This is likely due to recent treatment. The patient denies recent history of bleeding such as epistaxis, hematuria or hematochezia. She is asymptomatic from the anemia. I will observe for now.  She does not require transfusion now. I will continue the chemotherapy at current dose without dosage adjustment.

## 2015-08-18 NOTE — Progress Notes (Signed)
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   12/13/2013 Imaging CT scan show large mass in the right lung apex likely representing primary lung carcinoma or large metastasis. Metastases demonstrated in the pretracheal lymph nodes, T1 vertebral body and possibly sacrum.   12/14/2013 Initial Diagnosis Adenocarcinoma of upper lobe of right lung   12/14/2013 Pathology Results Accession: [id number] biopsy from right upper lobe showed adenocarcinoma. [facility] testing was positive for several mutations without any known treatment options now   12/17/2013 Imaging MRI of the head is negative.   12/23/2013 - 02/05/2014 Radiation Therapy She received radiation therapy to the mediastinal mass and bone   12/28/2013 Imaging PET/CT scan showed mediastinal mass, lymphadenopathy and T1 involvement.   01/01/2014 - 01/29/2014 Chemotherapy Weekly carboplatin and Taxol were added   03/12/2014 Imaging Repeat PET scan showed near complete response to treatment.   03/16/2014 - 05/28/2014 Chemotherapy She started treatment with Alimta for maintenance   07/07/2014 Imaging PET scan showed disease progression   07/15/2014 - 09/23/2014 Chemotherapy She received palliative treatment with Opdivo   08/13/2014 - 08/25/2014 Radiation Therapy She completed palliative radiation therapy to the bone.   10/05/2014 Imaging PET CT showed disease progression except for site of  recent radiation therapy   10/13/2014 - 04/06/2015 Chemotherapy She received Rx with weekly carbo/taxol, 2 weeks on 1 week off with Avastin every 3 weeks   12/15/2014 Imaging PET scan showed positive response to Rx   01/18/2015 Imaging Ct chest showed disease improvement   04/19/2015 Imaging Ct chest diease progression   04/27/2015 -  Chemotherapy Treatment is switched to Gemzar and Navelbine   05/04/2015 Adverse Reaction Treatment was missed & interrupted due to recurrent UTI   06/13/2015 Imaging MRI head is negative   08/17/2015 Imaging CT imaging showed positive response to Rx    INTERVAL HISTORY: Please see below for problem oriented charting. She returns to review test results of CT imaging. She continues to have cough with productive sputum. Denies hemoptysis. No worsening neuropathy. Denies fever or chills. Denies chest pain.  REVIEW OF SYSTEMS:   Constitutional: Denies fevers, chills or abnormal weight loss Eyes: Denies blurriness of vision Ears, nose, mouth, throat, and face: Denies mucositis or sore throat Cardiovascular: Denies palpitation, chest discomfort or lower extremity swelling Gastrointestinal:  Denies nausea, heartburn or change in bowel habits Skin: Denies abnormal skin rashes Lymphatics: Denies new lymphadenopathy or easy bruising Neurological:Denies numbness, tingling or new weaknesses Behavioral/Psych: Mood is stable, no new changes  All other systems were reviewed with the patient and are negative.  I have reviewed the past medical history, past surgical history, social history and family history with the patient and they are unchanged from previous note.  ALLERGIES:  is allergic to codeine; penicillins; and sulfa antibiotics.  MEDICATIONS:  Current Outpatient Prescriptions  Medication Sig Dispense Refill  . albuterol (PROVENTIL HFA;VENTOLIN HFA) 108 (90 BASE)  MCG/ACT inhaler Inhale 2 puffs into the lungs every 4 (four) hours as needed for wheezing or shortness  of breath. 1 Inhaler 5  . amLODipine (NORVASC) 10 MG tablet Take 1 tablet (10 mg total) by mouth at bedtime. 90 tablet 2  . fluticasone (FLONASE) 50 MCG/ACT nasal spray Place 2 sprays into both nostrils daily. 16 g 5  . Fluticasone Furoate-Vilanterol (BREO ELLIPTA) 200-25 MCG/INH AEPB Inhale 1 puff into the lungs daily. Reported on 04/20/2015    . hydrochlorothiazide (HYDRODIURIL) 25 MG tablet TAKE 1 TABLET BY MOUTH EVERY DAY 30 tablet 1  . levofloxacin (LEVAQUIN) 500 MG tablet Take 1 tablet (500 mg total) by mouth daily. 7 tablet 0  . levothyroxine (SYNTHROID, LEVOTHROID) 200 MCG tablet TAKE 1 TABLET BY MOUTH DAILY BEFORE BREAKFAST. 90 tablet 0  . lidocaine-prilocaine (EMLA) cream Apply 1 application topically as needed. 90 g 0  . lisinopril (PRINIVIL) 10 MG tablet Take 1 tablet (10 mg total) by mouth at bedtime. 30 tablet 9  . LORazepam (ATIVAN) 0.5 MG tablet TAKE 1 TABLET BY MOUTH EVERY 8 HOURS AS NEEDED FOR ANXIETY OR FOR NAUSEA 90 tablet 0  . mirtazapine (REMERON) 15 MG tablet TAKE 1 TABLET (15 MG TOTAL) BY MOUTH AT BEDTIME AS NEEDED (FOR SLEEP.). 90 tablet 6  . ondansetron (ZOFRAN) 8 MG tablet TAKE 1 TABLET BY MOUTH EVERY 6 HOURS AS NEEDED FOR NAUSEA. 90 tablet 0  . predniSONE (DELTASONE) 10 MG tablet Take 4 tablets for 3 days, 3 tablets for 3 days, 2 tablets for 3 days, 1 tablet for 3 days 30 tablet 0  . prochlorperazine (COMPAZINE) 10 MG tablet TAKE 1 TABLET BY MOUTH EVERY 6 (SIX) HOURS AS NEEDED FOR NAUSEA OR VOMITING. 30 tablet 1  . ranitidine (ZANTAC) 150 MG tablet Take 1 tablet (150 mg total) by mouth every evening. 90 tablet 2  . sucralfate (CARAFATE) 1 G tablet Take 1 g by mouth 4 (four) times daily. Reported on 01/12/2015  0   No current facility-administered medications for this visit.   Facility-Administered Medications Ordered in Other Visits  Medication Dose Route Frequency Provider Last Rate Last Dose  . 0.9 %  sodium chloride infusion   Intravenous Continuous [name], MD    Stopped at 08/18/15 1325  . sodium chloride flush (NS) 0.9 % injection 10 mL  10 mL Intracatheter PRN [name], MD   10 mL at 08/18/15 1318    PHYSICAL EXAMINATION: ECOG PERFORMANCE STATUS: 1 - Symptomatic but completely ambulatory  Filed Vitals:   08/18/15 1059  BP: 154/90  Pulse: 89  Temp: 98.6 F (37 C)  Resp: 19   Filed Weights   08/18/15 1059  Weight: 233 lb 6.4 oz (105.87 kg)    GENERAL:alert, no distress and comfortable. She is obese and mildly cushingoid SKIN: skin color, texture, turgor are normal, no rashes or significant lesions EYES: normal, Conjunctiva are pink and non-injected, sclera clear OROPHARYNX:no exudate, no erythema and lips, buccal mucosa, and tongue normal  NECK: supple, thyroid normal size, non-tender, without nodularity LYMPH:  no palpable lymphadenopathy in the cervical, axillary or inguinal LUNGS: clear to auscultation and percussion with normal breathing effort HEART: regular rate & rhythm and no murmurs with mild bilateral lower extremity edema ABDOMEN:abdomen soft, non-tender and normal bowel sounds Musculoskeletal:no cyanosis of digits and no clubbing  NEURO: alert & oriented x 3 with fluent speech, no focal motor/sensory deficits  LABORATORY DATA:  I have reviewed the data as listed    Component  Value Date/Time   NA 142 08/17/2015 0904   NA 138 06/24/2014 1422   K 4.0 08/17/2015 0904   K 4.0 06/24/2014 1422   CL 96* 06/24/2014 1422   CO2 27 08/17/2015 0904   CO2 30 06/24/2014 1422   GLUCOSE 91 08/17/2015 0904   GLUCOSE 151* 06/24/2014 1422   BUN 19.5 08/17/2015 0904   BUN 13 06/24/2014 1422   CREATININE 0.9 08/17/2015 0904   CREATININE 1.54* 06/24/2014 1422   CALCIUM 9.3 08/17/2015 0904   CALCIUM 9.1 06/24/2014 1422   PROT 7.3 08/17/2015 0904   PROT 7.5 12/13/2013 2018   ALBUMIN 3.6 08/17/2015 0904   ALBUMIN 3.7 12/13/2013 2018   AST 16 08/17/2015 0904   AST 17 12/13/2013 2018   ALT 22 08/17/2015 0904   ALT 14 12/13/2013  2018   ALKPHOS 111 08/17/2015 0904   ALKPHOS 80 12/13/2013 2018   BILITOT <0.30 08/17/2015 0904   BILITOT 0.3 12/13/2013 2018   GFRNONAA 36* 06/24/2014 1422   GFRAA 41* 06/24/2014 1422    No results found for: SPEP, UPEP  Lab Results  Component Value Date   WBC 16.9* 08/17/2015   NEUTROABS 14.1* 08/17/2015   HGB 10.6* 08/17/2015   HCT 33.7* 08/17/2015   MCV 99.4 08/17/2015   PLT 226 08/17/2015      Chemistry      Component Value Date/Time   NA 142 08/17/2015 0904   NA 138 06/24/2014 1422   K 4.0 08/17/2015 0904   K 4.0 06/24/2014 1422   CL 96* 06/24/2014 1422   CO2 27 08/17/2015 0904   CO2 30 06/24/2014 1422   BUN 19.5 08/17/2015 0904   BUN 13 06/24/2014 1422   CREATININE 0.9 08/17/2015 0904   CREATININE 1.54* 06/24/2014 1422      Component Value Date/Time   CALCIUM 9.3 08/17/2015 0904   CALCIUM 9.1 06/24/2014 1422   ALKPHOS 111 08/17/2015 0904   ALKPHOS 80 12/13/2013 2018   AST 16 08/17/2015 0904   AST 17 12/13/2013 2018   ALT 22 08/17/2015 0904   ALT 14 12/13/2013 2018   BILITOT <0.30 08/17/2015 0904   BILITOT 0.3 12/13/2013 2018       RADIOGRAPHIC STUDIES: I have personally reviewed the radiological images as listed and agreed with the findings in the report. Ct Chest W Contrast  08/17/2015  CLINICAL DATA:  Right lung adenocarcinoma with ongoing chemotherapy. Remote history thyroid cancer. Chronic cough. COPD. Shortness of breath. Prior radiation therapy. EXAM: CT CHEST, ABDOMEN, AND PELVIS WITH CONTRAST TECHNIQUE: Multidetector CT imaging of the chest, abdomen and pelvis was performed following the standard protocol during bolus administration of intravenous contrast. CONTRAST:  128m ISOVUE-300 IOPAMIDOL (ISOVUE-300) INJECTION 61% COMPARISON:  None. Multiple exams, including 04/19/2015 and 12/15/2014 FINDINGS: CT CHEST FINDINGS Cardiovascular: Atherosclerotic calcification of the aortic arch and branch vasculature noted. Right Port-A-Cath terminates in the  right atrium. Mediastinum/Nodes: Right upper paratracheal node 0.8 cm in short axis on image 16/2, previously 1.1 cm. Subcarinal node below the bronchus intermedius 1.3 cm in short axis on image 25/2, previously 1.8 cm. Lungs/Pleura: Right hilar and suprahilar airspace opacity with a bandlike configuration favoring radiation pneumonitis/ fibrosis. The medial component of the airspace opacity in the right upper lobe is increased, with an increased degree of volume loss, compared to 3/20 1/17. Scattered bilateral pulmonary nodules appear mildly reduced in volume compared to the prior exam. For example, a left lower lobe nodule on image 56/4 measures at 0.8 by 0.5 cm, formerly 1.1  by 0.9 cm. A right middle lobe lesion on image 53/4 measures 0.8 by 0.8 cm, formerly 1.1 by 1.0 cm. I do not observe any new nodules. Airway thickening noted bilaterally, increased from prior, especially in the lower lobes, with some airway plugging. Musculoskeletal: Sclerotic metastatic involvement at C7 and T1, not appreciably changed. Sclerosis laterally in the right first rib with a posterior rib fracture as on the prior exam. CT ABDOMEN PELVIS FINDINGS Hepatobiliary: Scattered small hypodense lesions in the liver not appreciably changed from prior. These were not hypermetabolic on prior PET-CT. Motion artifact blurs parts of the liver. Gallbladder grossly unremarkable. Pancreas: Unremarkable Spleen: Unremarkable Adrenals/Urinary Tract: Adrenal glands normal. Exophytic fluid density lesion from the left kidney lower pole compatible with cyst measuring 1.5 cm in diameter on image 57/602. Adjacent to this is a 0.8 cm mildly hyper dense lesion also hyperdense on the nuclear medicine PET-CT of 12/15/2014 and accordingly probably simply a benign Bosniak category 2 cyst. No urinary tract calculi are identified. Stomach/Bowel: Periampullary duodenal diverticulum. Appendix normal. Vascular/Lymphatic: Aortoiliac atherosclerotic vascular disease.  No pathologic abdominal adenopathy. Reproductive: Unremarkable Other: There is some reticular stranding adjacent to the sigmoid colon for example on image 113/2, no change from prior PET-CT old were this was not hypermetabolic, probably a benign postinflammatory appearance. Musculoskeletal: Degenerative arthropathy of both hips, right greater than left. Lumbar spondylosis and degenerative disc disease are observed. IMPRESSION: 1. The scattered pulmonary nodules favoring metastatic disease are mildly reduced in size. No new pulmonary metastatic lesion identified. In addition, the mediastinal lymph nodes, although still prominent, are reduced in size. This appearance suggests interval response to therapy. 2. There is some increased consolidation in the right upper lobe adjacent to the paramediastinal fibrosis/radiation pneumonitis. 3. Atherosclerotic aortic arch, branch vasculature, and abdominal aorta. 4. Increased airway thickening bilaterally, especially in the lower lobes, and associated with some lower lobe plugging. 5. Diffuse sclerosis at C7 and T1, not appreciably changed, likely a manifestation of prior osseous metastatic disease. This area was not appreciably hypermetabolic on PET-CT of 23/55/7322. 6. Right greater the left degenerative hip arthropathy. Lumbar spondylosis and degenerative disc disease. 7. Two adjacent cystic lesions of the left kidney lower pole, both likely benign. The smaller lesion is complex, favor Bosniak category 2 cyst. 8. Stable appearance of sclerosis laterally in the right first rib with an adjacent rib fracture. This is near the vicinity of radiation pneumonitis and osteonecrosis with fracture related to radiation therapy is not readily excluded. Electronically Signed   By: Van Clines M.D.   On: 08/17/2015 12:15   Ct Abdomen Pelvis W Contrast  08/17/2015  : Please see accession #[id number] for report. Electronically Signed   By: Van Clines M.D.   On: 08/17/2015  12:29     ASSESSMENT & PLAN:  Adenocarcinoma of upper lobe of right lung CT scan show improved disease control. I will proceed with chemotherapy today with dose adjustment as before. Plan to repeat imaging study again in 3 months, next due around October 2017  Radiation pneumonitis [facility]) CT scan show evidence of radiation pneumonitis. She was recently prescribed with levofloxacin. She is also on a taper course of prednisone. I recommend she stays on 20 mg prednisone until I see her back in 3 weeks for future slow taper  Anemia due to antineoplastic chemotherapy This is likely due to recent treatment. The patient denies recent history of bleeding such as epistaxis, hematuria or hematochezia. She is asymptomatic from the anemia. I will observe for now.  She does not require transfusion now. I will continue the chemotherapy at current dose without dosage adjustment.     Essential hypertension Her blood pressure fluctuated widely with therapy. Continue current treatment.   DNR (do not resuscitate) She brought with her a copy of her advanced directive. The patient would like to be on DO NOT RESUSCITATE status. She understood that she has terminal illness and goals of therapy is palliative in nature   No orders of the defined types were placed in this encounter.   All questions were answered. The patient knows to call the clinic with any problems, questions or concerns. No barriers to learning was detected. I spent 25 minutes counseling the patient face to face. The total time spent in the appointment was 40 minutes and more than 50% was on counseling and review of test results     [facility], Keishon Chavarin, MD 08/18/2015 4:00 PM

## 2015-08-18 NOTE — Telephone Encounter (Signed)
Pt will get updated sched in tx room, apt put in per orders

## 2015-08-18 NOTE — Assessment & Plan Note (Addendum)
CT scan show evidence of radiation pneumonitis. She was recently prescribed with levofloxacin. She is also on a taper course of prednisone. I recommend she stays on 20 mg prednisone until I see her back in 3 weeks for future slow taper

## 2015-08-18 NOTE — Assessment & Plan Note (Signed)
CT scan show improved disease control. I will proceed with chemotherapy today with dose adjustment as before. Plan to repeat imaging study again in 3 months, next due around October 2017

## 2015-08-18 NOTE — Assessment & Plan Note (Signed)
She brought with her a copy of her advanced directive. The patient would like to be on DO NOT RESUSCITATE status. She understood that she has terminal illness and goals of therapy is palliative in nature

## 2015-08-18 NOTE — Assessment & Plan Note (Signed)
Her blood pressure fluctuated widely with therapy. Continue current treatment.

## 2015-08-18 NOTE — Patient Instructions (Signed)
[place] Discharge Instructions for Patients Receiving Chemotherapy  Today you received the following chemotherapy agents :  Navelbine,  Gemcitabine.  To help prevent nausea and vomiting after your treatment, we encourage you to take your nausea medication as prescribed.   If you develop nausea and vomiting that is not controlled by your nausea medication, call the clinic.   BELOW ARE SYMPTOMS THAT SHOULD BE REPORTED IMMEDIATELY:  *FEVER GREATER THAN 100.5 F  *CHILLS WITH OR WITHOUT FEVER  NAUSEA AND VOMITING THAT IS NOT CONTROLLED WITH YOUR NAUSEA MEDICATION  *UNUSUAL SHORTNESS OF BREATH  *UNUSUAL BRUISING OR BLEEDING  TENDERNESS IN MOUTH AND THROAT WITH OR WITHOUT PRESENCE OF ULCERS  *URINARY PROBLEMS  *BOWEL PROBLEMS  UNUSUAL RASH Items with * indicate a potential emergency and should be followed up as soon as possible.  Feel free to call the clinic you have any questions or concerns. The clinic phone number is [phone].  Please show the [place] at check-in to the Emergency Department and triage nurse.

## 2015-08-24 ENCOUNTER — Other Ambulatory Visit: Payer: BLUE CROSS/BLUE SHIELD

## 2015-08-25 ENCOUNTER — Ambulatory Visit: Payer: BLUE CROSS/BLUE SHIELD

## 2015-08-25 ENCOUNTER — Other Ambulatory Visit (HOSPITAL_BASED_OUTPATIENT_CLINIC_OR_DEPARTMENT_OTHER): Payer: BLUE CROSS/BLUE SHIELD

## 2015-08-25 ENCOUNTER — Ambulatory Visit (HOSPITAL_BASED_OUTPATIENT_CLINIC_OR_DEPARTMENT_OTHER): Payer: BLUE CROSS/BLUE SHIELD

## 2015-08-25 VITALS — BP 155/79 | HR 78 | Temp 98.3°F

## 2015-08-25 DIAGNOSIS — C3411 Malignant neoplasm of upper lobe, right bronchus or lung: Secondary | ICD-10-CM | POA: Diagnosis not present

## 2015-08-25 DIAGNOSIS — Z5111 Encounter for antineoplastic chemotherapy: Secondary | ICD-10-CM | POA: Diagnosis not present

## 2015-08-25 LAB — CBC WITH DIFFERENTIAL/PLATELET
BASO%: 0.1 % (ref 0.0–2.0)
Basophils Absolute: 0 10*3/uL (ref 0.0–0.1)
EOS ABS: 0 10*3/uL (ref 0.0–0.5)
EOS%: 0.5 % (ref 0.0–7.0)
HEMATOCRIT: 31.1 % — AB (ref 34.8–46.6)
HGB: 9.8 g/dL — ABNORMAL LOW (ref 11.6–15.9)
LYMPH%: 10.2 % — AB (ref 14.0–49.7)
MCH: 31.1 pg (ref 25.1–34.0)
MCHC: 31.5 g/dL (ref 31.5–36.0)
MCV: 98.7 fL (ref 79.5–101.0)
MONO#: 0.4 10*3/uL (ref 0.1–0.9)
MONO%: 4.9 % (ref 0.0–14.0)
NEUT%: 84.3 % — AB (ref 38.4–76.8)
NEUTROS ABS: 6.6 10*3/uL — AB (ref 1.5–6.5)
PLATELETS: 146 10*3/uL (ref 145–400)
RBC: 3.15 10*6/uL — ABNORMAL LOW (ref 3.70–5.45)
RDW: 18.7 % — ABNORMAL HIGH (ref 11.2–14.5)
WBC: 7.8 10*3/uL (ref 3.9–10.3)
lymph#: 0.8 10*3/uL — ABNORMAL LOW (ref 0.9–3.3)
nRBC: 0 % (ref 0–0)

## 2015-08-25 LAB — COMPREHENSIVE METABOLIC PANEL
ALK PHOS: 82 U/L (ref 40–150)
ALT: 37 U/L (ref 0–55)
ANION GAP: 11 meq/L (ref 3–11)
AST: 20 U/L (ref 5–34)
Albumin: 3.4 g/dL — ABNORMAL LOW (ref 3.5–5.0)
BILIRUBIN TOTAL: 0.32 mg/dL (ref 0.20–1.20)
BUN: 21.8 mg/dL (ref 7.0–26.0)
CALCIUM: 8.9 mg/dL (ref 8.4–10.4)
CO2: 26 mEq/L (ref 22–29)
Chloride: 105 mEq/L (ref 98–109)
Creatinine: 0.9 mg/dL (ref 0.6–1.1)
EGFR: 71 mL/min/{1.73_m2} — AB (ref >90)
GLUCOSE: 113 mg/dL (ref 70–140)
POTASSIUM: 3.7 meq/L (ref 3.5–5.1)
Sodium: 142 mEq/L (ref 136–145)
TOTAL PROTEIN: 7 g/dL (ref 6.4–8.3)

## 2015-08-25 MED ORDER — PALONOSETRON HCL INJECTION 0.25 MG/5ML
0.2500 mg | Freq: Once | INTRAVENOUS | Status: AC
  Administered 2015-08-25: 0.25 mg via INTRAVENOUS

## 2015-08-25 MED ORDER — PALONOSETRON HCL INJECTION 0.25 MG/5ML
INTRAVENOUS | Status: AC
  Filled 2015-08-25: qty 5

## 2015-08-25 MED ORDER — SODIUM CHLORIDE 0.9 % IV SOLN
INTRAVENOUS | Status: DC
  Administered 2015-08-25: 11:00:00 via INTRAVENOUS

## 2015-08-25 MED ORDER — HEPARIN SOD (PORK) LOCK FLUSH 100 UNIT/ML IV SOLN
500.0000 [IU] | Freq: Once | INTRAVENOUS | Status: AC | PRN
  Administered 2015-08-25: 500 [IU]
  Filled 2015-08-25: qty 5

## 2015-08-25 MED ORDER — VINORELBINE TARTRATE CHEMO INJECTION 50 MG/5ML
17.0000 mg/m2 | Freq: Once | INTRAVENOUS | Status: AC
  Administered 2015-08-25: 40 mg via INTRAVENOUS
  Filled 2015-08-25: qty 4

## 2015-08-25 MED ORDER — SODIUM CHLORIDE 0.9 % IV SOLN
Freq: Once | INTRAVENOUS | Status: AC
  Administered 2015-08-25: 12:00:00 via INTRAVENOUS
  Filled 2015-08-25: qty 5

## 2015-08-25 MED ORDER — SODIUM CHLORIDE 0.9% FLUSH
10.0000 mL | INTRAVENOUS | Status: DC | PRN
  Administered 2015-08-25: 10 mL
  Filled 2015-08-25: qty 10

## 2015-08-25 MED ORDER — GEMCITABINE HCL CHEMO INJECTION 1 GM/26.3ML
700.0000 mg/m2 | Freq: Once | INTRAVENOUS | Status: AC
  Administered 2015-08-25: 1672 mg via INTRAVENOUS
  Filled 2015-08-25: qty 43.97

## 2015-08-25 NOTE — Progress Notes (Signed)
Blood return noted before and after Navelbine.

## 2015-08-25 NOTE — Patient Instructions (Signed)
[place] Discharge Instructions for Patients Receiving Chemotherapy  Today you received the following chemotherapy agents:  Gemzar & Navelbine  To help prevent nausea and vomiting after your treatment, we encourage you to take your nausea medication.   If you develop nausea and vomiting that is not controlled by your nausea medication, call the clinic.   BELOW ARE SYMPTOMS THAT SHOULD BE REPORTED IMMEDIATELY:  *FEVER GREATER THAN 100.5 F  *CHILLS WITH OR WITHOUT FEVER  NAUSEA AND VOMITING THAT IS NOT CONTROLLED WITH YOUR NAUSEA MEDICATION  *UNUSUAL SHORTNESS OF BREATH  *UNUSUAL BRUISING OR BLEEDING  TENDERNESS IN MOUTH AND THROAT WITH OR WITHOUT PRESENCE OF ULCERS  *URINARY PROBLEMS  *BOWEL PROBLEMS  UNUSUAL RASH Items with * indicate a potential emergency and should be followed up as soon as possible.  Feel free to call the clinic you have any questions or concerns. The clinic phone number is [phone].  Please show the [place] at check-in to the Emergency Department and triage nurse.

## 2015-08-26 ENCOUNTER — Telehealth: Payer: Self-pay

## 2015-08-26 ENCOUNTER — Ambulatory Visit (HOSPITAL_BASED_OUTPATIENT_CLINIC_OR_DEPARTMENT_OTHER): Payer: BLUE CROSS/BLUE SHIELD

## 2015-08-26 ENCOUNTER — Other Ambulatory Visit: Payer: Self-pay | Admitting: *Deleted

## 2015-08-26 VITALS — BP 136/68 | HR 90 | Temp 98.4°F

## 2015-08-26 DIAGNOSIS — Z5189 Encounter for other specified aftercare: Secondary | ICD-10-CM

## 2015-08-26 DIAGNOSIS — C3411 Malignant neoplasm of upper lobe, right bronchus or lung: Secondary | ICD-10-CM | POA: Diagnosis not present

## 2015-08-26 MED ORDER — PREDNISONE 20 MG PO TABS: 20.0000 mg | ORAL_TABLET | Freq: Every day | ORAL | 0 refills | Status: DC

## 2015-08-26 MED ORDER — PEGFILGRASTIM INJECTION 6 MG/0.6ML
6.0000 mg | Freq: Once | SUBCUTANEOUS | Status: AC
  Administered 2015-08-26: 6 mg via SUBCUTANEOUS
  Filled 2015-08-26: qty 0.6

## 2015-08-26 NOTE — Patient Instructions (Signed)
Pegfilgrastim injection What is this medicine? PEGFILGRASTIM (PEG fil gra stim) is a long-acting granulocyte colony-stimulating factor that stimulates the growth of neutrophils, a type of white blood cell important in the body's fight against infection. It is used to reduce the incidence of fever and infection in patients with certain types of cancer who are receiving chemotherapy that affects the bone marrow, and to increase survival after being exposed to high doses of radiation. This medicine may be used for other purposes; ask your health care provider or pharmacist if you have questions. What should I tell my health care provider before I take this medicine? They need to know if you have any of these conditions: -kidney disease -latex allergy -ongoing radiation therapy -sickle cell disease -skin reactions to acrylic adhesives (On-Body Injector only) -an unusual or allergic reaction to pegfilgrastim, filgrastim, other medicines, foods, dyes, or preservatives -pregnant or trying to get pregnant -breast-feeding How should I use this medicine? This medicine is for injection under the skin. If you get this medicine at home, you will be taught how to prepare and give the pre-filled syringe or how to use the On-body Injector. Refer to the patient Instructions for Use for detailed instructions. Use exactly as directed. Take your medicine at regular intervals. Do not take your medicine more often than directed. It is important that you put your used needles and syringes in a special sharps container. Do not put them in a trash can. If you do not have a sharps container, call your pharmacist or healthcare provider to get one. Talk to your pediatrician regarding the use of this medicine in children. While this drug may be prescribed for selected conditions, precautions do apply. Overdosage: If you think you have taken too much of this medicine contact a poison control center or emergency room at  once. NOTE: This medicine is only for you. Do not share this medicine with others. What if I miss a dose? It is important not to miss your dose. Call your doctor or health care professional if you miss your dose. If you miss a dose due to an On-body Injector failure or leakage, a new dose should be administered as soon as possible using a single prefilled syringe for manual use. What may interact with this medicine? Interactions have not been studied. Give your health care provider a list of all the medicines, herbs, non-prescription drugs, or dietary supplements you use. Also tell them if you smoke, drink alcohol, or use illegal drugs. Some items may interact with your medicine. This list may not describe all possible interactions. Give your health care provider a list of all the medicines, herbs, non-prescription drugs, or dietary supplements you use. Also tell them if you smoke, drink alcohol, or use illegal drugs. Some items may interact with your medicine. What should I watch for while using this medicine? You may need blood work done while you are taking this medicine. If you are going to need a MRI, CT scan, or other procedure, tell your doctor that you are using this medicine (On-Body Injector only). What side effects may I notice from receiving this medicine? Side effects that you should report to your doctor or health care professional as soon as possible: -allergic reactions like skin rash, itching or hives, swelling of the face, lips, or tongue -dizziness -fever -pain, redness, or irritation at site where injected -pinpoint red spots on the skin -red or dark-brown urine -shortness of breath or breathing problems -stomach or side pain, or pain   at the shoulder -swelling -tiredness -trouble passing urine or change in the amount of urine Side effects that usually do not require medical attention (report to your doctor or health care professional if they continue or are  bothersome): -bone pain -muscle pain This list may not describe all possible side effects. Call your doctor for medical advice about side effects. You may report side effects to FDA at 1-800-FDA-1088. Where should I keep my medicine? Keep out of the reach of children. Store pre-filled syringes in a refrigerator between 2 and 8 degrees C (36 and 46 degrees F). Do not freeze. Keep in carton to protect from light. Throw away this medicine if it is left out of the refrigerator for more than 48 hours. Throw away any unused medicine after the expiration date. NOTE: This sheet is a summary. It may not cover all possible information. If you have questions about this medicine, talk to your doctor, pharmacist, or health care provider.    2016, Elsevier/Gold Standard. (2014-02-04 14:30:14)  

## 2015-09-07 ENCOUNTER — Encounter: Payer: Self-pay | Admitting: *Deleted

## 2015-09-07 ENCOUNTER — Encounter: Payer: Self-pay | Admitting: Hematology and Oncology

## 2015-09-07 ENCOUNTER — Other Ambulatory Visit (HOSPITAL_BASED_OUTPATIENT_CLINIC_OR_DEPARTMENT_OTHER): Payer: BLUE CROSS/BLUE SHIELD

## 2015-09-07 ENCOUNTER — Ambulatory Visit (HOSPITAL_BASED_OUTPATIENT_CLINIC_OR_DEPARTMENT_OTHER): Payer: BLUE CROSS/BLUE SHIELD

## 2015-09-07 ENCOUNTER — Ambulatory Visit: Payer: BLUE CROSS/BLUE SHIELD

## 2015-09-07 ENCOUNTER — Telehealth: Payer: Self-pay | Admitting: Hematology and Oncology

## 2015-09-07 ENCOUNTER — Ambulatory Visit (HOSPITAL_BASED_OUTPATIENT_CLINIC_OR_DEPARTMENT_OTHER): Payer: BLUE CROSS/BLUE SHIELD | Admitting: Hematology and Oncology

## 2015-09-07 DIAGNOSIS — Z5111 Encounter for antineoplastic chemotherapy: Secondary | ICD-10-CM

## 2015-09-07 DIAGNOSIS — Z515 Encounter for palliative care: Secondary | ICD-10-CM

## 2015-09-07 DIAGNOSIS — C3411 Malignant neoplasm of upper lobe, right bronchus or lung: Secondary | ICD-10-CM

## 2015-09-07 DIAGNOSIS — D72829 Elevated white blood cell count, unspecified: Secondary | ICD-10-CM | POA: Diagnosis not present

## 2015-09-07 DIAGNOSIS — D6481 Anemia due to antineoplastic chemotherapy: Secondary | ICD-10-CM

## 2015-09-07 DIAGNOSIS — I10 Essential (primary) hypertension: Secondary | ICD-10-CM | POA: Diagnosis not present

## 2015-09-07 DIAGNOSIS — Z66 Do not resuscitate: Secondary | ICD-10-CM

## 2015-09-07 DIAGNOSIS — T451X5A Adverse effect of antineoplastic and immunosuppressive drugs, initial encounter: Secondary | ICD-10-CM

## 2015-09-07 LAB — COMPREHENSIVE METABOLIC PANEL
ALT: 27 U/L (ref 0–55)
ANION GAP: 11 meq/L (ref 3–11)
AST: 15 U/L (ref 5–34)
Albumin: 3.5 g/dL (ref 3.5–5.0)
Alkaline Phosphatase: 134 U/L (ref 40–150)
BUN: 15.9 mg/dL (ref 7.0–26.0)
CHLORIDE: 105 meq/L (ref 98–109)
CO2: 27 meq/L (ref 22–29)
Calcium: 9.2 mg/dL (ref 8.4–10.4)
Creatinine: 1 mg/dL (ref 0.6–1.1)
EGFR: 61 mL/min/{1.73_m2} — AB (ref >90)
Glucose: 101 mg/dl (ref 70–140)
Potassium: 3.8 mEq/L (ref 3.5–5.1)
Sodium: 143 mEq/L (ref 136–145)
TOTAL PROTEIN: 7 g/dL (ref 6.4–8.3)

## 2015-09-07 LAB — CBC WITH DIFFERENTIAL/PLATELET
BASO%: 1 % (ref 0.0–2.0)
Basophils Absolute: 0.2 10*3/uL — ABNORMAL HIGH (ref 0.0–0.1)
EOS ABS: 0.1 10*3/uL (ref 0.0–0.5)
EOS%: 0.4 % (ref 0.0–7.0)
HCT: 31.7 % — ABNORMAL LOW (ref 34.8–46.6)
HGB: 9.9 g/dL — ABNORMAL LOW (ref 11.6–15.9)
LYMPH%: 6.3 % — AB (ref 14.0–49.7)
MCH: 31.6 pg (ref 25.1–34.0)
MCHC: 31.2 g/dL — AB (ref 31.5–36.0)
MCV: 101.2 fL — AB (ref 79.5–101.0)
MONO#: 1.4 10*3/uL — AB (ref 0.1–0.9)
MONO%: 8 % (ref 0.0–14.0)
NEUT#: 15.2 10*3/uL — ABNORMAL HIGH (ref 1.5–6.5)
NEUT%: 84.3 % — AB (ref 38.4–76.8)
PLATELETS: 165 10*3/uL (ref 145–400)
RBC: 3.14 10*6/uL — AB (ref 3.70–5.45)
RDW: 21.7 % — ABNORMAL HIGH (ref 11.2–14.5)
WBC: 18.1 10*3/uL — ABNORMAL HIGH (ref 3.9–10.3)
lymph#: 1.1 10*3/uL (ref 0.9–3.3)

## 2015-09-07 MED ORDER — PALONOSETRON HCL INJECTION 0.25 MG/5ML
INTRAVENOUS | Status: AC
  Filled 2015-09-07: qty 5

## 2015-09-07 MED ORDER — SODIUM CHLORIDE 0.9% FLUSH
10.0000 mL | INTRAVENOUS | Status: DC | PRN
  Administered 2015-09-07: 10 mL
  Filled 2015-09-07: qty 10

## 2015-09-07 MED ORDER — SODIUM CHLORIDE 0.9 % IV SOLN
Freq: Once | INTRAVENOUS | Status: AC
  Administered 2015-09-07: 13:00:00 via INTRAVENOUS
  Filled 2015-09-07: qty 5

## 2015-09-07 MED ORDER — SODIUM CHLORIDE 0.9 % IV SOLN
40.0000 mg | Freq: Once | INTRAVENOUS | Status: AC
  Administered 2015-09-07: 40 mg via INTRAVENOUS
  Filled 2015-09-07: qty 4

## 2015-09-07 MED ORDER — SODIUM CHLORIDE 0.9 % IV SOLN
700.0000 mg/m2 | Freq: Once | INTRAVENOUS | Status: AC
  Administered 2015-09-07: 1672 mg via INTRAVENOUS
  Filled 2015-09-07: qty 43.97

## 2015-09-07 MED ORDER — SODIUM CHLORIDE 0.9 % IJ SOLN
10.0000 mL | INTRAMUSCULAR | Status: DC | PRN
  Administered 2015-09-07: 10 mL via INTRAVENOUS
  Filled 2015-09-07: qty 10

## 2015-09-07 MED ORDER — HEPARIN SOD (PORK) LOCK FLUSH 100 UNIT/ML IV SOLN
500.0000 [IU] | Freq: Once | INTRAVENOUS | Status: AC | PRN
  Administered 2015-09-07: 500 [IU]
  Filled 2015-09-07: qty 5

## 2015-09-07 MED ORDER — PALONOSETRON HCL INJECTION 0.25 MG/5ML
0.2500 mg | Freq: Once | INTRAVENOUS | Status: AC
Start: 2015-09-07 — End: 2015-09-07
  Administered 2015-09-07: 0.25 mg via INTRAVENOUS

## 2015-09-07 MED ORDER — SODIUM CHLORIDE 0.9 % IV SOLN
INTRAVENOUS | Status: DC
  Administered 2015-09-07: 13:00:00 via INTRAVENOUS

## 2015-09-07 NOTE — Assessment & Plan Note (Signed)
CODE STATUS is DO NOT RESUSCITATE The patient wants to go back to work. I signed disability paperwork allowing her to return to work but place restriction only 3 hours, 1 day a week.

## 2015-09-07 NOTE — Assessment & Plan Note (Signed)
This is likely due to recent treatment. The patient denies recent history of bleeding such as epistaxis, hematuria or hematochezia. She is asymptomatic from the anemia. I will observe for now.  She does not require transfusion now. I will continue the chemotherapy at current dose without dosage adjustment.

## 2015-09-07 NOTE — Assessment & Plan Note (Signed)
This is likely due to recent Neulasta injection. Observe only

## 2015-09-07 NOTE — Assessment & Plan Note (Signed)
Recent CT scan show improved disease control. I will proceed with chemotherapy today with dose adjustment as before. Plan to repeat imaging study again in 3 months, next due around October 2017

## 2015-09-07 NOTE — Patient Instructions (Signed)
[place] Discharge Instructions for Patients Receiving Chemotherapy  Today you received the following chemotherapy agents Navelbine/Gemzar To help prevent nausea and vomiting after your treatment, we encourage you to take your nausea medication as prescribed.   If you develop nausea and vomiting that is not controlled by your nausea medication, call the clinic.   BELOW ARE SYMPTOMS THAT SHOULD BE REPORTED IMMEDIATELY:  *FEVER GREATER THAN 100.5 F  *CHILLS WITH OR WITHOUT FEVER  NAUSEA AND VOMITING THAT IS NOT CONTROLLED WITH YOUR NAUSEA MEDICATION  *UNUSUAL SHORTNESS OF BREATH  *UNUSUAL BRUISING OR BLEEDING  TENDERNESS IN MOUTH AND THROAT WITH OR WITHOUT PRESENCE OF ULCERS  *URINARY PROBLEMS  *BOWEL PROBLEMS  UNUSUAL RASH Items with * indicate a potential emergency and should be followed up as soon as possible.  Feel free to call the clinic you have any questions or concerns. The clinic phone number is [phone].  Please show the [place] at check-in to the Emergency Department and triage nurse.

## 2015-09-07 NOTE — Assessment & Plan Note (Signed)
Her blood pressure fluctuated widely with therapy. Continue current treatment.

## 2015-09-07 NOTE — Telephone Encounter (Signed)
per of to sch pt appt-gave pt copy of avs/cal

## 2015-09-07 NOTE — Care Management (Signed)
FMLA paperwork completed and signed by Dr. Alvy Bimler.  Faxed to [employer] leave of Absence Dept at fax [phone].  Pt requests to return to work 3 hrs /day for one day per week.  Original given back to pt.Jasmine Grant

## 2015-09-07 NOTE — Progress Notes (Signed)
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     12/13/2013 Imaging    CT scan show large mass in the right lung apex likely representing primary lung carcinoma or large metastasis. Metastases demonstrated in the pretracheal lymph nodes, T1 vertebral body and possibly sacrum.     12/14/2013 Initial Diagnosis    Adenocarcinoma of upper lobe of right lung     12/14/2013 Pathology Results    Accession: [id number] biopsy from right upper lobe showed adenocarcinoma. [facility] testing was positive for several mutations without any known treatment options now     12/17/2013 Imaging    MRI of the head is negative.     12/23/2013 - 02/05/2014 Radiation Therapy    She received radiation therapy to the mediastinal mass and bone     12/28/2013 Imaging    PET/CT scan showed mediastinal mass, lymphadenopathy and T1 involvement.     01/01/2014 - 01/29/2014 Chemotherapy    Weekly carboplatin and Taxol were added     03/12/2014 Imaging    Repeat PET scan showed near complete response to treatment.     03/16/2014 - 05/28/2014 Chemotherapy    She started treatment with Alimta for maintenance     07/07/2014 Imaging    PET scan showed disease progression     07/15/2014 - 09/23/2014 Chemotherapy    She received palliative treatment with Opdivo     08/13/2014 - 08/25/2014 Radiation Therapy    She completed palliative radiation therapy  to the bone.     10/05/2014 Imaging    PET CT showed disease progression except for site of recent radiation therapy     10/13/2014 - 04/06/2015 Chemotherapy    She received Rx with weekly carbo/taxol, 2 weeks on 1 week off with Avastin every 3 weeks     12/15/2014 Imaging    PET scan showed positive response to Rx     01/18/2015 Imaging    Ct chest showed disease improvement     04/19/2015 Imaging    Ct chest diease progression     04/27/2015 -  Chemotherapy    Treatment is switched to Gemzar and Navelbine     05/04/2015 Adverse Reaction    Treatment was missed & interrupted due to recurrent UTI     06/13/2015 Imaging    MRI head is negative     08/17/2015 Imaging    CT imaging showed positive response to Rx      INTERVAL HISTORY: Please see below for problem oriented charting. She returns for chemotherapy. She is doing well. Denies nausea or vomiting. No recent infection. She has mild bilateral lower extremity edema, stable. She denies recent change in bowel habits. Denies neuropathy.  REVIEW OF SYSTEMS:   Constitutional: Denies fevers, chills or abnormal weight loss Eyes: Denies blurriness of vision Ears, nose, mouth, throat, and face: Denies mucositis or sore throat  Respiratory: Denies cough, dyspnea or wheezes Cardiovascular: Denies palpitation, chest discomfort o Gastrointestinal:  Denies nausea, heartburn or change in bowel habits Skin: Denies abnormal skin rashes Lymphatics: Denies new lymphadenopathy or easy bruising Neurological:Denies numbness, tingling or new weaknesses Behavioral/Psych: Mood is stable, no new changes  All other systems were reviewed with the patient and are negative.  I have reviewed the past medical history, past surgical history, social history and family history with the patient and they are unchanged from previous note.  ALLERGIES:  is allergic to codeine; penicillins; and sulfa antibiotics.  MEDICATIONS:  Current Outpatient  Prescriptions  Medication Sig Dispense Refill  . albuterol (PROVENTIL HFA;VENTOLIN HFA) 108 (90 BASE) MCG/ACT inhaler Inhale 2 puffs into the lungs every 4 (four) hours as needed for wheezing or shortness of breath. 1 Inhaler 5  . amLODipine (NORVASC) 10 MG tablet Take 1 tablet (10 mg total) by mouth at bedtime. 90 tablet 2  . fluticasone (FLONASE) 50 MCG/ACT nasal spray Place 2 sprays into both nostrils daily. 16 g 5  . Fluticasone Furoate-Vilanterol (BREO ELLIPTA) 200-25 MCG/INH AEPB Inhale 1 puff into the lungs daily. Reported on 04/20/2015    . hydrochlorothiazide (HYDRODIURIL) 25 MG tablet TAKE 1 TABLET BY MOUTH EVERY DAY 30 tablet 1  . levofloxacin (LEVAQUIN) 500 MG tablet Take 1 tablet (500 mg total) by mouth daily. 7 tablet 0  . levothyroxine (SYNTHROID, LEVOTHROID) 200 MCG tablet TAKE 1 TABLET BY MOUTH DAILY BEFORE BREAKFAST. 90 tablet 0  . lidocaine-prilocaine (EMLA) cream Apply 1 application topically as needed. 90 g 0  . lisinopril (PRINIVIL) 10 MG tablet Take 1 tablet (10 mg total) by mouth at bedtime. 30 tablet 9  . LORazepam (ATIVAN) 0.5 MG tablet TAKE 1 TABLET BY MOUTH EVERY 8 HOURS AS NEEDED FOR ANXIETY OR FOR NAUSEA 90 tablet 0  . mirtazapine (REMERON) 15 MG tablet TAKE 1 TABLET (15 MG TOTAL) BY MOUTH AT BEDTIME AS NEEDED (FOR SLEEP.). 90 tablet 6  . ondansetron (ZOFRAN) 8 MG tablet TAKE 1 TABLET BY MOUTH EVERY 6 HOURS AS NEEDED FOR NAUSEA. 90 tablet 0  . predniSONE (DELTASONE) 20 MG tablet Take 1 tablet (20 mg total) by mouth daily with breakfast. 30 tablet 0  . prochlorperazine (COMPAZINE) 10 MG tablet TAKE 1 TABLET BY MOUTH EVERY 6 (SIX) HOURS AS NEEDED FOR NAUSEA OR VOMITING. 30 tablet 1  . ranitidine (ZANTAC) 150 MG tablet Take 1 tablet (150 mg total) by mouth every evening. 90 tablet 2  . sucralfate (CARAFATE) 1 G tablet Take 1 g by mouth 4 (four) times daily. Reported on 01/12/2015  0   No current facility-administered medications for this visit.    Facility-Administered  Medications Ordered in Other Visits  Medication Dose Route Frequency Provider Last Rate Last Dose  . 0.9 %  sodium chloride infusion   Intravenous Continuous [name], MD   Stopped at 09/07/15 1447  . sodium chloride flush (NS) 0.9 % injection 10 mL  10 mL Intracatheter PRN [name], MD   10 mL at 09/07/15 1447    PHYSICAL EXAMINATION: ECOG PERFORMANCE STATUS: 1 - Symptomatic but completely ambulatory  Vitals:   09/07/15 1146  BP: (!) 149/78  Pulse: 70  Resp: 18  Temp: 98.2 F (36.8 C)   Filed Weights   09/07/15 1146  Weight: 234 lb 6.4 oz (106.3 kg)    GENERAL:alert, no distress and comfortable. She is obese and mildly cushingoid SKIN: skin color, texture, turgor are normal, no rashes or significant lesions  EYES: normal, Conjunctiva are pink and non-injected, sclera clear OROPHARYNX:no exudate, no erythema and lips, buccal mucosa, and tongue normal  NECK: Significant radiation fibrosis around the neck is noted LYMPH:  no palpable lymphadenopathy in the cervical, axillary or inguinal LUNGS: clear to auscultation and percussion with normal breathing effort HEART: regular rate & rhythm and no murmurs and no lower extremity edema ABDOMEN:abdomen soft, non-tender and normal bowel sounds Musculoskeletal:no cyanosis of digits and no clubbing  NEURO: alert & oriented x 3 with fluent speech, no focal motor/sensory deficits  LABORATORY DATA:  I have reviewed the data as listed    Component Value Date/Time   NA 143 09/07/2015 1047   K 3.8 09/07/2015 1047   CL 96 (L) 06/24/2014 1422   CO2 27 09/07/2015 1047   GLUCOSE 101 09/07/2015 1047   BUN 15.9 09/07/2015 1047   CREATININE 1.0 09/07/2015 1047   CALCIUM 9.2 09/07/2015 1047   PROT 7.0 09/07/2015 1047   ALBUMIN 3.5 09/07/2015 1047   AST 15 09/07/2015 1047   ALT 27 09/07/2015 1047   ALKPHOS 134 09/07/2015 1047   BILITOT <0.30 09/07/2015 1047   GFRNONAA 36 (L) 06/24/2014 1422   GFRAA 41 (L) 06/24/2014 1422    No results  found for: SPEP, UPEP  Lab Results  Component Value Date   WBC 18.1 (H) 09/07/2015   NEUTROABS 15.2 (H) 09/07/2015   HGB 9.9 (L) 09/07/2015   HCT 31.7 (L) 09/07/2015   MCV 101.2 (H) 09/07/2015   PLT 165 09/07/2015      Chemistry      Component Value Date/Time   NA 143 09/07/2015 1047   K 3.8 09/07/2015 1047   CL 96 (L) 06/24/2014 1422   CO2 27 09/07/2015 1047   BUN 15.9 09/07/2015 1047   CREATININE 1.0 09/07/2015 1047      Component Value Date/Time   CALCIUM 9.2 09/07/2015 1047   ALKPHOS 134 09/07/2015 1047   AST 15 09/07/2015 1047   ALT 27 09/07/2015 1047   BILITOT <0.30 09/07/2015 1047      ASSESSMENT & PLAN:  Adenocarcinoma of upper lobe of right lung Recent CT scan show improved disease control. I will proceed with chemotherapy today with dose adjustment as before. Plan to repeat imaging study again in 3 months, next due around October 2017  Anemia due to antineoplastic chemotherapy This is likely due to recent treatment. The patient denies recent history of bleeding such as epistaxis, hematuria or hematochezia. She is asymptomatic from the anemia. I will observe for now.  She does not require transfusion now. I will continue the chemotherapy at current dose without dosage adjustment.     Essential hypertension Her blood pressure fluctuated widely with therapy. Continue current treatment.   Palliative care by specialist CODE STATUS is DO NOT RESUSCITATE The patient wants to go back to work. I signed disability paperwork allowing her to return to work but place restriction only 3 hours, 1 day a week.  Leukocytosis This is likely due to recent Neulasta injection. Observe only   No orders of the defined types were placed in this encounter.  All questions were answered. The patient knows to call the clinic with any problems, questions or concerns. No barriers to learning was detected. I spent 25 minutes counseling the patient face to face. The total time  spent in the appointment was 30 minutes and more than 50% was on counseling and review of test results     [facility], [place], MD 09/07/2015 5:40 PM

## 2015-09-08 ENCOUNTER — Telehealth: Payer: Self-pay | Admitting: Hematology and Oncology

## 2015-09-08 NOTE — Telephone Encounter (Signed)
Pt called to r/s 8/16 appts to 8/18 due to other obligations

## 2015-09-14 ENCOUNTER — Encounter: Payer: Self-pay | Admitting: Hematology and Oncology

## 2015-09-14 ENCOUNTER — Other Ambulatory Visit: Payer: BLUE CROSS/BLUE SHIELD

## 2015-09-14 ENCOUNTER — Ambulatory Visit: Payer: BLUE CROSS/BLUE SHIELD

## 2015-09-14 NOTE — Progress Notes (Signed)
forms left in box 09/08/15

## 2015-09-15 ENCOUNTER — Other Ambulatory Visit: Payer: Self-pay

## 2015-09-15 ENCOUNTER — Encounter: Payer: Self-pay | Admitting: Hematology and Oncology

## 2015-09-15 ENCOUNTER — Telehealth: Payer: Self-pay | Admitting: *Deleted

## 2015-09-15 ENCOUNTER — Encounter: Payer: Self-pay | Admitting: *Deleted

## 2015-09-15 ENCOUNTER — Emergency Department (HOSPITAL_COMMUNITY): Payer: BLUE CROSS/BLUE SHIELD

## 2015-09-15 ENCOUNTER — Encounter (HOSPITAL_COMMUNITY): Payer: Self-pay | Admitting: Emergency Medicine

## 2015-09-15 ENCOUNTER — Ambulatory Visit: Payer: BLUE CROSS/BLUE SHIELD

## 2015-09-15 ENCOUNTER — Observation Stay (HOSPITAL_COMMUNITY)
Admission: EM | Admit: 2015-09-15 | Discharge: 2015-09-17 | Disposition: A | Discharge status: Home / Self Care | Payer: BLUE CROSS/BLUE SHIELD | Attending: Internal Medicine | Admitting: Internal Medicine

## 2015-09-15 DIAGNOSIS — I2699 Other pulmonary embolism without acute cor pulmonale: Principal | ICD-10-CM | POA: Diagnosis present

## 2015-09-15 DIAGNOSIS — D61810 Antineoplastic chemotherapy induced pancytopenia: Secondary | ICD-10-CM | POA: Diagnosis not present

## 2015-09-15 DIAGNOSIS — Z8589 Personal history of malignant neoplasm of other organs and systems: Secondary | ICD-10-CM | POA: Insufficient documentation

## 2015-09-15 DIAGNOSIS — M1990 Unspecified osteoarthritis, unspecified site: Secondary | ICD-10-CM | POA: Diagnosis not present

## 2015-09-15 DIAGNOSIS — J449 Chronic obstructive pulmonary disease, unspecified: Secondary | ICD-10-CM | POA: Insufficient documentation

## 2015-09-15 DIAGNOSIS — T451X5A Adverse effect of antineoplastic and immunosuppressive drugs, initial encounter: Secondary | ICD-10-CM | POA: Diagnosis present

## 2015-09-15 DIAGNOSIS — Z85850 Personal history of malignant neoplasm of thyroid: Secondary | ICD-10-CM | POA: Insufficient documentation

## 2015-09-15 DIAGNOSIS — Z79899 Other long term (current) drug therapy: Secondary | ICD-10-CM | POA: Diagnosis not present

## 2015-09-15 DIAGNOSIS — Z66 Do not resuscitate: Secondary | ICD-10-CM | POA: Diagnosis not present

## 2015-09-15 DIAGNOSIS — Z87891 Personal history of nicotine dependence: Secondary | ICD-10-CM | POA: Diagnosis not present

## 2015-09-15 DIAGNOSIS — Z923 Personal history of irradiation: Secondary | ICD-10-CM | POA: Diagnosis not present

## 2015-09-15 DIAGNOSIS — E039 Hypothyroidism, unspecified: Secondary | ICD-10-CM | POA: Diagnosis not present

## 2015-09-15 DIAGNOSIS — R002 Palpitations: Secondary | ICD-10-CM | POA: Insufficient documentation

## 2015-09-15 DIAGNOSIS — I10 Essential (primary) hypertension: Secondary | ICD-10-CM | POA: Insufficient documentation

## 2015-09-15 DIAGNOSIS — Z7951 Long term (current) use of inhaled steroids: Secondary | ICD-10-CM | POA: Insufficient documentation

## 2015-09-15 DIAGNOSIS — C3411 Malignant neoplasm of upper lobe, right bronchus or lung: Secondary | ICD-10-CM | POA: Diagnosis present

## 2015-09-15 DIAGNOSIS — Z8249 Family history of ischemic heart disease and other diseases of the circulatory system: Secondary | ICD-10-CM | POA: Diagnosis not present

## 2015-09-15 DIAGNOSIS — R0602 Shortness of breath: Secondary | ICD-10-CM | POA: Diagnosis present

## 2015-09-15 LAB — CBC WITH DIFFERENTIAL/PLATELET
BASOS ABS: 0 10*3/uL (ref 0.0–0.1)
BASOS PCT: 0 %
EOS ABS: 0 10*3/uL (ref 0.0–0.7)
Eosinophils Relative: 0 %
HCT: 30.9 % — ABNORMAL LOW (ref 36.0–46.0)
Hemoglobin: 9.6 g/dL — ABNORMAL LOW (ref 12.0–15.0)
LYMPHS PCT: 6 %
Lymphs Abs: 0.7 10*3/uL (ref 0.7–4.0)
MCH: 31.9 pg (ref 26.0–34.0)
MCHC: 31.1 g/dL (ref 30.0–36.0)
MCV: 102.7 fL — AB (ref 78.0–100.0)
MONO ABS: 1 10*3/uL (ref 0.1–1.0)
Monocytes Relative: 8 %
NEUTROS PCT: 86 %
Neutro Abs: 10.4 10*3/uL — ABNORMAL HIGH (ref 1.7–7.7)
PLATELETS: 115 10*3/uL — AB (ref 150–400)
RBC: 3.01 MIL/uL — ABNORMAL LOW (ref 3.87–5.11)
RDW: 19.4 % — AB (ref 11.5–15.5)
WBC: 12.1 10*3/uL — AB (ref 4.0–10.5)

## 2015-09-15 LAB — COMPREHENSIVE METABOLIC PANEL
ALT: 39 U/L (ref 14–54)
AST: 22 U/L (ref 15–41)
Albumin: 3.9 g/dL (ref 3.5–5.0)
Alkaline Phosphatase: 96 U/L (ref 38–126)
Anion gap: 9 (ref 5–15)
BUN: 21 mg/dL — AB (ref 6–20)
CHLORIDE: 104 mmol/L (ref 101–111)
CO2: 27 mmol/L (ref 22–32)
Calcium: 9 mg/dL (ref 8.9–10.3)
Creatinine, Ser: 0.96 mg/dL (ref 0.44–1.00)
Glucose, Bld: 139 mg/dL — ABNORMAL HIGH (ref 65–99)
POTASSIUM: 3.5 mmol/L (ref 3.5–5.1)
SODIUM: 140 mmol/L (ref 135–145)
Total Bilirubin: 0.5 mg/dL (ref 0.3–1.2)
Total Protein: 7.2 g/dL (ref 6.5–8.1)

## 2015-09-15 LAB — D-DIMER, QUANTITATIVE (NOT AT ARMC): D DIMER QUANT: 2.12 ug{FEU}/mL — AB (ref 0.00–0.50)

## 2015-09-15 LAB — BRAIN NATRIURETIC PEPTIDE: B NATRIURETIC PEPTIDE 5: 195.4 pg/mL — AB (ref 0.0–100.0)

## 2015-09-15 MED ORDER — LORAZEPAM 0.5 MG PO TABS
0.5000 mg | ORAL_TABLET | Freq: Two times a day (BID) | ORAL | Status: DC
  Filled 2015-09-15 (×2): qty 1

## 2015-09-15 MED ORDER — MIRTAZAPINE 15 MG PO TABS
15.0000 mg | ORAL_TABLET | Freq: Every day | ORAL | Status: DC
  Administered 2015-09-15 – 2015-09-16 (×2): 15 mg via ORAL
  Filled 2015-09-15 (×2): qty 1

## 2015-09-15 MED ORDER — FLUTICASONE FUROATE-VILANTEROL 200-25 MCG/INH IN AEPB
1.0000 | INHALATION_SPRAY | Freq: Every day | RESPIRATORY_TRACT | Status: DC
  Filled 2015-09-15: qty 28

## 2015-09-15 MED ORDER — LISINOPRIL 10 MG PO TABS
10.0000 mg | ORAL_TABLET | Freq: Every day | ORAL | Status: DC
  Filled 2015-09-15: qty 1

## 2015-09-15 MED ORDER — FAMOTIDINE 20 MG PO TABS
20.0000 mg | ORAL_TABLET | Freq: Two times a day (BID) | ORAL | Status: DC
  Administered 2015-09-15 – 2015-09-16 (×3): 20 mg via ORAL
  Filled 2015-09-15 (×4): qty 1

## 2015-09-15 MED ORDER — IOPAMIDOL (ISOVUE-370) INJECTION 76%
100.0000 mL | Freq: Once | INTRAVENOUS | Status: AC | PRN
  Administered 2015-09-15: 100 mL via INTRAVENOUS

## 2015-09-15 MED ORDER — OXYCODONE HCL 5 MG PO TABS: 5.0000 mg | ORAL_TABLET | ORAL | Status: DC | PRN

## 2015-09-15 MED ORDER — ACETAMINOPHEN 325 MG PO TABS: 650.0000 mg | ORAL_TABLET | Freq: Four times a day (QID) | ORAL | Status: DC | PRN

## 2015-09-15 MED ORDER — LEVOTHYROXINE SODIUM 100 MCG PO TABS
200.0000 ug | ORAL_TABLET | Freq: Every day | ORAL | Status: DC
  Administered 2015-09-16 – 2015-09-17 (×2): 200 ug via ORAL
  Filled 2015-09-15 (×3): qty 2

## 2015-09-15 MED ORDER — HEPARIN BOLUS VIA INFUSION: 4000.0000 [IU] | Freq: Once | INTRAVENOUS | Status: DC

## 2015-09-15 MED ORDER — AMLODIPINE BESYLATE 10 MG PO TABS
10.0000 mg | ORAL_TABLET | Freq: Every day | ORAL | Status: DC
  Filled 2015-09-15: qty 1

## 2015-09-15 MED ORDER — PREDNISONE 20 MG PO TABS
20.0000 mg | ORAL_TABLET | Freq: Every day | ORAL | Status: DC
  Administered 2015-09-16 – 2015-09-17 (×2): 20 mg via ORAL
  Filled 2015-09-15 (×2): qty 1

## 2015-09-15 MED ORDER — ALBUTEROL SULFATE (2.5 MG/3ML) 0.083% IN NEBU: 5.0000 mg | INHALATION_SOLUTION | Freq: Once | RESPIRATORY_TRACT | Status: DC

## 2015-09-15 MED ORDER — HYDROCHLOROTHIAZIDE 25 MG PO TABS
25.0000 mg | ORAL_TABLET | Freq: Every day | ORAL | Status: DC
  Filled 2015-09-15 (×2): qty 1

## 2015-09-15 MED ORDER — ALBUTEROL SULFATE (2.5 MG/3ML) 0.083% IN NEBU: 2.5000 mg | INHALATION_SOLUTION | RESPIRATORY_TRACT | Status: DC | PRN

## 2015-09-15 MED ORDER — PROCHLORPERAZINE MALEATE 10 MG PO TABS: 10.0000 mg | ORAL_TABLET | Freq: Four times a day (QID) | ORAL | Status: DC | PRN

## 2015-09-15 MED ORDER — SODIUM CHLORIDE 0.9% FLUSH
10.0000 mL | INTRAVENOUS | Status: DC | PRN
  Administered 2015-09-17 (×2): 10 mL
  Filled 2015-09-15 (×2): qty 40

## 2015-09-15 MED ORDER — ACETAMINOPHEN 650 MG RE SUPP: 650.0000 mg | Freq: Four times a day (QID) | RECTAL | Status: DC | PRN

## 2015-09-15 MED ORDER — ENOXAPARIN SODIUM 100 MG/ML ~~LOC~~ SOLN
100.0000 mg | SUBCUTANEOUS | Status: AC
  Administered 2015-09-15: 100 mg via SUBCUTANEOUS
  Filled 2015-09-15 (×2): qty 1

## 2015-09-15 MED ORDER — SODIUM CHLORIDE 0.9% FLUSH: 3.0000 mL | Freq: Two times a day (BID) | INTRAVENOUS | Status: DC

## 2015-09-15 MED ORDER — ONDANSETRON HCL 4 MG/2ML IJ SOLN: 4.0000 mg | Freq: Four times a day (QID) | INTRAMUSCULAR | Status: DC | PRN

## 2015-09-15 MED ORDER — ALBUTEROL SULFATE HFA 108 (90 BASE) MCG/ACT IN AERS: 2.0000 | INHALATION_SPRAY | RESPIRATORY_TRACT | Status: DC | PRN

## 2015-09-15 MED ORDER — ONDANSETRON HCL 4 MG PO TABS: 4.0000 mg | ORAL_TABLET | Freq: Four times a day (QID) | ORAL | Status: DC | PRN

## 2015-09-15 MED ORDER — ENOXAPARIN SODIUM 100 MG/ML ~~LOC~~ SOLN
100.0000 mg | Freq: Two times a day (BID) | SUBCUTANEOUS | Status: DC
  Administered 2015-09-16: 100 mg via SUBCUTANEOUS
  Filled 2015-09-15 (×2): qty 1

## 2015-09-15 NOTE — ED Notes (Signed)
MD at bedside. 

## 2015-09-15 NOTE — ED Notes (Signed)
I stat troponin result not crossing into EPIC. I stat troponin was 0.03 per paper result.

## 2015-09-15 NOTE — ED Triage Notes (Signed)
Patient presents for 5 day history of increasing SOB and palpitations starting earlier today. History of lung CA, last chemo 8/9. Denies swelling, cough, fever, N/V. A&O x4.

## 2015-09-15 NOTE — H&P (Signed)
History and Physical    Jasmine Grant MHD:[id number] DOB: 03/18/1955 DOA: 09/15/2015  PCP: Reginia Naas, MD   Patient coming from: Home  Chief Complaint: Shortness of breath  HPI: Jasmine Grant is a 60 y.o. 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 Alvy Bimler, diagnosed in 2015, S/P multiple rounds of chemo and palliative radiation, currently being treated with Gemzar and Navelbine), remote thyroid cancer S/P thyroidectomy, and HTN (reportedly takes her anti-hypertensives on a prn basis at home) who presents for evaluation of progressive shortness of breath at rest and dyspnea on exertion for the past weeks.  She reports chronic lower extremity swelling since starting chemotherapy and had not noticed a change from baseline.  No particular leg or calf pain.  Remarkably, she has not had hypoxia.  No syncope or LOC.  ED Course: CTA of the chest shows RLL segmental PE.  There are other changes noted consistent with her known history of lung cancer.  Hospitalist asked to admit for observation and initiation of anticoagulant therapy.  Review of Systems: As per HPI otherwise 10 point review of systems negative.    Past Medical History:  Diagnosis Date  . Arthritis 04/27/2014  . Drug-induced hypokalemia 05/26/2015  . Dysuria 11/03/2014  . Essential hypertension 04/27/2014  . Headache disorder 06/08/2015  . Hx of thyroid cancer    10 years ago  . Insomnia 12/29/2013  . Lung cancer ([place]) 12/14/13   Adenocarcinoma of right upper lobe mediastinal mass and bone   . 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 Panic attack   . S/P radiation therapy completed 12/16/13-02/05/14   RULL lung  . Thyroid ca [facility]) dx'd 20+yrs ago   surg only  . UTI (lower urinary tract infection) 05/26/2015  . UTI (urinary tract infection) 04/29/2015    Past Surgical History:  Procedure Laterality Date  . THYROIDECTOMY       reports that she quit smoking about 6 years ago. Her  smoking use included Cigarettes. She has a 30.00 pack-year smoking history. She has never used smokeless tobacco. She reports that she drinks alcohol. She reports that she does not use drugs. She is married.  She has two sons.  One of her sons is actually her medical POA>  Allergies  Allergen Reactions  . Codeine Nausea And Vomiting and Other (See Comments)    Any medication that is a codeine derivative like Hydrocodone, etc.  . Penicillins     Unknown reaction as a child  . Sulfa Antibiotics Nausea And Vomiting    Family History  Problem Relation Age of Onset  . Heart disease Mother   . Heart disease Father   . Thyroid cancer Father      Prior to Admission medications   Medication Sig Start Date End Date Taking? Authorizing Provider  albuterol (PROVENTIL HFA;VENTOLIN HFA) 108 (90 BASE) MCG/ACT inhaler Inhale 2 puffs into the lungs every 4 (four) hours as needed for wheezing or shortness of breath. 11/25/14  Yes Collene Gobble, MD  amLODipine (NORVASC) 10 MG tablet Take 1 tablet (10 mg total) by mouth at bedtime. Patient taking differently: Take 10 mg by mouth daily as needed (high blood pressure).  07/13/15  Yes [name], MD  fluticasone (FLONASE) 50 MCG/ACT nasal spray Place 2 sprays into both nostrils daily. Patient taking differently: Place 2 sprays into both nostrils daily as needed for allergies.  11/25/14  Yes Collene Gobble, MD  Fluticasone Furoate-Vilanterol (BREO  ELLIPTA) 200-25 MCG/INH AEPB Inhale 1 puff into the lungs daily. Reported on 04/20/2015   Yes Historical Provider, MD  levothyroxine (SYNTHROID, LEVOTHROID) 200 MCG tablet TAKE 1 TABLET BY MOUTH DAILY BEFORE BREAKFAST. 06/30/15  Yes [name], MD  lisinopril (PRINIVIL) 10 MG tablet Take 1 tablet (10 mg total) by mouth at bedtime. Patient taking differently: Take 10 mg by mouth at bedtime as needed (high blood pressure).  07/13/15  Yes [name], MD  mirtazapine (REMERON) 15 MG tablet TAKE 1 TABLET (15 MG TOTAL) BY  MOUTH AT BEDTIME AS NEEDED (FOR SLEEP.). Patient taking differently: Take 15 mg by mouth at bedtime.  04/20/15  Yes [name], MD  predniSONE (DELTASONE) 20 MG tablet Take 1 tablet (20 mg total) by mouth daily with breakfast. 08/26/15  Yes [name], MD  ranitidine (ZANTAC) 150 MG tablet Take 1 tablet (150 mg total) by mouth every evening. 12/31/14  Yes [name], MD  hydrochlorothiazide (HYDRODIURIL) 25 MG tablet TAKE 1 TABLET BY MOUTH EVERY DAY Patient taking differently: TAKE 1 TABLET BY MOUTH EVERY DAY AS NEEDED FOR HIGH BLOOD PRESSURE 06/21/15   [name], MD  LORazepam (ATIVAN) 0.5 MG tablet TAKE 1 TABLET BY MOUTH EVERY 8 HOURS AS NEEDED FOR ANXIETY OR FOR NAUSEA 12/27/14   Ni Gorsuch, MD  ondansetron (ZOFRAN) 8 MG tablet TAKE 1 TABLET BY MOUTH EVERY 6 HOURS AS NEEDED FOR NAUSEA. 12/31/14   [name], MD  prochlorperazine (COMPAZINE) 10 MG tablet TAKE 1 TABLET BY MOUTH EVERY 6 (SIX) HOURS AS NEEDED FOR NAUSEA OR VOMITING. 06/16/15   [name], MD    Physical Exam: Vitals:   09/15/15 1820 09/15/15 1921 09/15/15 2158 09/15/15 2216  BP: 124/62 146/91 143/81 (!) 194/94  Pulse: 72 85 89 94  Resp: '14 19 16 18  '$ Temp:  98.9 F (37.2 C) 98.7 F (37.1 C) 98.8 F (37.1 C)  TempSrc:  Oral Oral Oral  SpO2: 98% 96% 96% 94%  Weight:    105 kg (231 lb 7.7 oz)  Height:    '6\' 2"'$  (1.88 m)      Constitutional: NAD, calm, comfortable, sitting on the edge of the bed at the time of my exam in the ED, off the monitor.  She has not had an oxygen requirement. Vitals:   09/15/15 1820 09/15/15 1921 09/15/15 2158 09/15/15 2216  BP: 124/62 146/91 143/81 (!) 194/94  Pulse: 72 85 89 94  Resp: '14 19 16 18  '$ Temp:  98.9 F (37.2 C) 98.7 F (37.1 C) 98.8 F (37.1 C)  TempSrc:  Oral Oral Oral  SpO2: 98% 96% 96% 94%  Weight:    105 kg (231 lb 7.7 oz)  Height:    '6\' 2"'$  (1.88 m)   Eyes: PERRL, lids and conjunctivae normal ENMT: Mucous membranes are moist. Posterior pharynx clear of any exudate or lesions.  Normal dentition.  Neck: normal appearance, supple Respiratory: Coarse breath sounds bilaterally but no significant wheeze.  Normal respiratory effort. No accessory muscle use.  Cardiovascular: Normal rate, regular rhythm, no murmurs / rubs / gallops. 1-2+ pitting edema in bilateral lower extremities.  2+ pedal pulses.  GI: abdomen is soft and compressible.  No distention.  No tenderness.  No masses palpated.  Bowel sounds are present. Musculoskeletal:  No joint deformity in upper and lower extremities. Good ROM, no contractures. Normal muscle tone.  Skin: no rashes, pale, cool, dry Neurologic: CN 2-12 grossly intact. Sensation intact, Strength symmetric bilaterally, 5/5  Psychiatric: Normal judgment  and insight. Alert and oriented x 3. Normal mood.     Labs on Admission: I have personally reviewed following labs and imaging studies  CBC:  Recent Labs Lab 09/15/15 1700  WBC 12.1*  NEUTROABS 10.4*  HGB 9.6*  HCT 30.9*  MCV 102.7*  PLT 979*   Basic Metabolic Panel:  Recent Labs Lab 09/15/15 1700  NA 140  K 3.5  CL 104  CO2 27  GLUCOSE 139*  BUN 21*  CREATININE 0.96  CALCIUM 9.0   GFR: Estimated Creatinine Clearance: 86.1 mL/min (by C-G formula based on SCr of 0.96 mg/dL). Liver Function Tests:  Recent Labs Lab 09/15/15 1700  AST 22  ALT 39  ALKPHOS 96  BILITOT 0.5  PROT 7.2  ALBUMIN 3.9    Radiological Exams on Admission: Dg Chest 2 View  Result Date: 09/15/2015 CLINICAL DATA:  Shortness of breath worsening over the last 4-5 days. Right lung adenocarcinoma. Last chemo treatment was yesterday. EXAM: CHEST  2 VIEW COMPARISON:  08/15/2015 and CT from 08/17/2015 FINDINGS: The focal consolidation in the medial right upper lung has not significantly changed from the previous chest radiograph. Right jugular Port-A-Cath tip is near the superior cavoatrial junction. No significant airspace disease. Question an enlarged nodule at the left lung base. Otherwise, no  significant nodularity appreciated on this chest radiograph examination. Heart and mediastinum are stable. No large pleural effusions. No acute bone abnormality. IMPRESSION: No active cardiopulmonary disease. Chronic consolidation and densities at the right lung apex have minimally changed. Indeterminate nodular density at the left lung base. Patient has known pulmonary nodules but not clear if this represents a true parenchymal nodule. Electronically Signed   By: Markus Daft M.D.   On: 09/15/2015 15:41   Ct Angio Chest Pe W And/or Wo Contrast  Result Date: 09/15/2015 CLINICAL DATA:  Five-day history progressive shortness of breath and palpitations. Lung cancer. EXAM: CT ANGIOGRAPHY CHEST WITH CONTRAST TECHNIQUE: Multidetector CT imaging of the chest was performed using the standard protocol during bolus administration of intravenous contrast. Multiplanar CT image reconstructions and MIPs were obtained to evaluate the vascular anatomy. CONTRAST:  100 mL Isovue 370 COMPARISON:  None. FINDINGS: Cardiovascular: Heart size normal. Coronary artery calcifications are present. No significant pericardial effusion is present. Mediastinum/Nodes: Pulmonary arterial opacification is excellent. There is a focal filling defect within the posterior basilar segmental artery in the right lower lobe compatible with pulmonary embolus. No other focal filling defects are present to suggest other pulmonary emboli. Atherosclerotic calcifications are present at the aortic arch. A right IJ Port-A-Cath is in place an accessed. The tip is in the right atrium An 18 mm subcarinal lymph node has increased in size. A small hilar node is present on the right, unchanged at 7 mm. No significant axillary adenopathy is present. Lungs/Pleura: A right apical lung mass is again noted. There is invasion of the chest wall. A right first rib fracture is stable. The tumor invades the right upper lobe bronchi. There is wall thickening into the right middle  lobe bronchi as well. There is wall thickening into the right middle lobe and lower lobe bronchi as well. Multiple bilateral pulmonary nodules are stable the slightly decreased in size. Upper Abdomen: Multiple hyperdense liver lesions are present. A 14 mm lesion posteriorly in the right lobe of the liver on image 77 is stable. No definite new lesions are present. The visualized portions of the pancreas and spleen are within normal limits. The adrenal glands are normal bilaterally. Musculoskeletal: Sclerosis  at C7 and T1 suggest metastatic disease to the bone. Multilevel facet degenerative changes are present. Vertebral body heights and alignment are maintained. Review of the MIP images confirms the above findings. IMPRESSION: 1. New right lower lobe segmental pulmonary embolus. 2. Heart size is normal. 3. Coronary artery disease. 4. Right apical lung mass with chest wall invasion invasion of the right upper lobe bronchi. 5. Stable to slight decrease in size of multiple pulmonary nodules compatible with metastatic disease. 6. Slight increased size of subcarinal lymph node. These results were called by telephone at the time of interpretation on 09/15/2015 at 8:14 pm to Dr. Milton Ferguson , who verbally acknowledged these results. Electronically Signed   By: San Morelle M.D.   On: 09/15/2015 20:14    EKG: Independently reviewed. NSR, no acute ST segment changes   Assessment/Plan Active Problems:   Acquired hypothyroidism   Adenocarcinoma of upper lobe of right lung   Pancytopenia due to antineoplastic chemotherapy ([place])   Pulmonary embolism ([facility])   Pulmonary embolus ([facility])      Acute PE --Weight based lovenox per protocol, pharmacy to dose --Will need to notify her oncologist of admission in the morning and discuss plan for long term management, timing of next chemo (patient reports that she's due to tomorrow) --LE dopplers to assess for residual clot burden in her legs --She will need  education if she will go home on lovenox injections  Lung cancer, active treatment --Will need to notify oncologist of new diagnosis in AM  HTN --Will plan to give home medications tonight for accelerated blood pressures  Hypothyroidism --Continue home dose of synthroid  COPD/chronic lung disease --Continue home inhalers, albuterol nebs prn --She has not had an oxygen requirement thus far   DVT prophylaxis: FULL dose anticoagulation Code Status: FULL Family Communication: Husband and son present in the ED at time of admission.  Multiple questions answered to the best of my ability and their apparent satisfaction. Disposition Plan: Expect she will go home when ready Consults called: NONE tonight but will need to call her oncologist Dr. Alvy Bimler in the AM; patient is expecting this. Admission status: Observation, telemetry   TIME SPENT: 70 minutes   Eber Jones MD [facility] Pager [phone]  If 7PM-7AM, please contact night-coverage [email or web address] Password [name]  09/15/2015, 11:42 PM

## 2015-09-15 NOTE — ED Notes (Signed)
Patient transported to CT 

## 2015-09-15 NOTE — Telephone Encounter (Signed)
Patient called stating that she was having heart palpitations and SOB (relating these symptoms to her taking prednisone) to the point of her inhaler is not working for her. Patient advised to go to the ER to be evaluated. Patient states that after she drives a relative to [facility] for labs she will go to the emergency room to be evaluated.

## 2015-09-15 NOTE — Progress Notes (Signed)
ANTICOAGULATION CONSULT NOTE - Initial Consult  Pharmacy Consult for Lovenox Indication: pulmonary embolus  Allergies  Allergen Reactions  . Codeine Nausea And Vomiting and Other (See Comments)    Any medication that is a codeine derivative like Hydrocodone, etc.  . Penicillins     Unknown reaction as a child  . Sulfa Antibiotics Nausea And Vomiting    Patient Measurements:   Last documented weight: 106.3 kg (09/07/15)  Vital Signs: Temp: 98.9 F (37.2 C) (08/17 1921) Temp Source: Oral (08/17 1921) BP: 146/91 (08/17 1921) Pulse Rate: 85 (08/17 1921)  Labs:  Recent Labs  09/15/15 1700  HGB 9.6*  HCT 30.9*  PLT 115*  CREATININE 0.96    Estimated Creatinine Clearance: 86.6 mL/min (by C-G formula based on SCr of 0.96 mg/dL).   Medical History: Past Medical History:  Diagnosis Date  . Arthritis 04/27/2014  . Drug-induced hypokalemia 05/26/2015  . Dysuria 11/03/2014  . Essential hypertension 04/27/2014  . Headache disorder 06/08/2015  . Hx of thyroid cancer    10 years ago  . Insomnia 12/29/2013  . 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 Panic attack   . S/P radiation therapy completed 12/16/13-02/05/14   RULL lung  . Thyroid ca [facility]) dx'd 20+yrs ago   surg only  . UTI (lower urinary tract infection) 05/26/2015  . UTI (urinary tract infection) 04/29/2015    Medications:  Scheduled:  Infusions:   Assessment: 30 yoF admitted on 8/17 with acute PE and history of lung cancer currently undergoing chemotherapy (last chemo given on 8/9, next dose due on 8/16).  No PTA anticoagulants.  Pharmacy has been consulted to dose Lovenox.    Today, 09/15/2015: SCr 0.96, CrCl ~ 86 ml/min CBC: Hgb 9.6, Plt 115    Goal of Therapy:  Anti-Xa level 0.6-1 units/ml 4hrs after LMWH dose given Monitor platelets by anticoagulation protocol: Yes   Plan:   Start Lovenox 100 mg SQ BID  Daily CBC;  continue to monitor H&H and platelets closely d/t recent chemo   Gretta Arab PharmD, BCPS Pager [phone] 09/15/2015 8:59 PM

## 2015-09-15 NOTE — ED Notes (Signed)
Pt given graham crackers and coke per MD approval.

## 2015-09-15 NOTE — Progress Notes (Signed)
forms left in box 09/08/15- left for for dr. Alvy Bimler to sign

## 2015-09-15 NOTE — Progress Notes (Signed)
forms left in box 09/08/15- left for for dr. Alvy Bimler to sign-faxed [facility] form [phone] left copy for patient pk up and sent to medical recrds

## 2015-09-15 NOTE — ED Provider Notes (Signed)
[facility] DEPT Provider Note   CSN: [id number] Arrival date & time: 09/15/15  1508     History   Chief Complaint Chief Complaint  Patient presents with  . Palpitations  . Shortness of Breath    HPI Jasmine Grant is a 60 y.o. female.  Patient complains of shortness of breath. Patient states she started getting short of breath weeks ago and it became worse 2 days ago. Patient has a history of lung cancer and is due for chemotherapy tomorrow   The history is provided by the patient. No language interpreter was used.  Palpitations   This is a new problem. The current episode started more than 2 days ago. The problem occurs constantly. The problem has been gradually worsening. The problem is associated with exercise. Associated symptoms include shortness of breath. Pertinent negatives include no diaphoresis, no chest pain, no abdominal pain, no headaches, no back pain and no cough. She has tried nothing for the symptoms. The treatment provided no relief. Risk factors: Cancer.    Past Medical History:  Diagnosis Date  . Arthritis 04/27/2014  . Drug-induced hypokalemia 05/26/2015  . Dysuria 11/03/2014  . Essential hypertension 04/27/2014  . Headache disorder 06/08/2015  . Hx of thyroid cancer    10 years ago  . Insomnia 12/29/2013  . 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 Panic attack   . S/P radiation therapy completed 12/16/13-02/05/14   RULL lung  . Thyroid ca [facility]) dx'd 20+yrs ago   surg only  . UTI (lower urinary tract infection) 05/26/2015  . UTI (urinary tract infection) 04/29/2015    Patient Active Problem List   Diagnosis Date Noted  . Pulmonary embolism ([place]) 09/15/2015  . Radiation pneumonitis ([place]) 08/18/2015  . DNR (do not resuscitate) 08/18/2015  . Acute bronchitis due to infection 07/27/2015  . Leukocytosis 07/07/2015  . Headache disorder 06/08/2015  . UTI (lower  urinary tract infection) 05/26/2015  . Drug-induced hypokalemia 05/26/2015  . Dehydration 05/26/2015  . Chemotherapy-induced nausea 05/26/2015  . Palliative care by specialist 05/18/2015  . Thrombocytopenia ([place]) 02/24/2015  . Other specified hypothyroidism 02/03/2015  . Numbness and tingling of right arm 01/12/2015  . Rash 11/26/2014  . COPD (chronic obstructive pulmonary disease) ([place]) 11/25/2014  . Pancytopenia due to antineoplastic chemotherapy ([place]) 11/10/2014  . 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 Dysuria 11/03/2014  . Cough 10/06/2014  . 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 Protein calorie malnutrition ([place]) 08/26/2014  . Elevated serum creatinine 08/26/2014  . Right shoulder pain 06/17/2014  . 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 Arthritis 04/27/2014  . Bilateral leg edema 04/27/2014  . Sinusitis, chronic 04/27/2014  . Essential hypertension 04/27/2014  . Other constipation 04/27/2014  . Cellulitis 04/20/2014  . Anemia due to antineoplastic chemotherapy 03/16/2014  . Cyst of skin 02/26/2014  . Other fatigue 02/26/2014  . Leukopenia due to antineoplastic chemotherapy 01/28/2014  . Radiation-induced dermatitis 01/28/2014  . Esophagitis 01/07/2014  . Hypersensitivity reaction 01/04/2014  . Insomnia 12/29/2013  . Constipation due to opioid therapy 12/17/2013  . Adenocarcinoma of upper lobe of right lung   . Nausea with vomiting 12/16/2013  . Elevated blood pressure 12/15/2013  . Generalized anxiety disorder 12/14/2013  . Acquired hypothyroidism 12/14/2013  . Overweight (BMI 25.0-29.9) 12/14/2013  . Epidural mass 12/13/2013  . 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 Pathologic compression fracture of spine  Past Surgical History:  Procedure Laterality Date  . THYROIDECTOMY      OB History    No data available       Home Medications    Prior to Admission medications   Medication Sig Start Date End Date Taking? Authorizing Provider  albuterol  (PROVENTIL HFA;VENTOLIN HFA) 108 (90 BASE) MCG/ACT inhaler Inhale 2 puffs into the lungs every 4 (four) hours as needed for wheezing or shortness of breath. 11/25/14  Yes Collene Gobble, MD  amLODipine (NORVASC) 10 MG tablet Take 1 tablet (10 mg total) by mouth at bedtime. Patient taking differently: Take 10 mg by mouth daily as needed (high blood pressure).  07/13/15  Yes [name], MD  fluticasone (FLONASE) 50 MCG/ACT nasal spray Place 2 sprays into both nostrils daily. Patient taking differently: Place 2 sprays into both nostrils daily as needed for allergies.  11/25/14  Yes Collene Gobble, MD  Fluticasone Furoate-Vilanterol (BREO ELLIPTA) 200-25 MCG/INH AEPB Inhale 1 puff into the lungs daily. Reported on 04/20/2015   Yes Historical Provider, MD  levothyroxine (SYNTHROID, LEVOTHROID) 200 MCG tablet TAKE 1 TABLET BY MOUTH DAILY BEFORE BREAKFAST. 06/30/15  Yes [name], MD  lisinopril (PRINIVIL) 10 MG tablet Take 1 tablet (10 mg total) by mouth at bedtime. Patient taking differently: Take 10 mg by mouth at bedtime as needed (high blood pressure).  07/13/15  Yes [name], MD  mirtazapine (REMERON) 15 MG tablet TAKE 1 TABLET (15 MG TOTAL) BY MOUTH AT BEDTIME AS NEEDED (FOR SLEEP.). Patient taking differently: Take 15 mg by mouth at bedtime.  04/20/15  Yes [name], MD  predniSONE (DELTASONE) 20 MG tablet Take 1 tablet (20 mg total) by mouth daily with breakfast. 08/26/15  Yes [name], MD  ranitidine (ZANTAC) 150 MG tablet Take 1 tablet (150 mg total) by mouth every evening. 12/31/14  Yes [name], MD  hydrochlorothiazide (HYDRODIURIL) 25 MG tablet TAKE 1 TABLET BY MOUTH EVERY DAY Patient taking differently: TAKE 1 TABLET BY MOUTH EVERY DAY AS NEEDED FOR HIGH BLOOD PRESSURE 06/21/15   [name], MD  levofloxacin (LEVAQUIN) 500 MG tablet Take 1 tablet (500 mg total) by mouth daily. Patient not taking: Reported on 09/15/2015 08/12/15   Collene Gobble, MD  lidocaine-prilocaine (EMLA) cream Apply 1  application topically as needed. Patient not taking: Reported on 09/15/2015 01/03/15   [name], MD  LORazepam (ATIVAN) 0.5 MG tablet TAKE 1 TABLET BY MOUTH EVERY 8 HOURS AS NEEDED FOR ANXIETY OR FOR NAUSEA 12/27/14   Ni Gorsuch, MD  ondansetron (ZOFRAN) 8 MG tablet TAKE 1 TABLET BY MOUTH EVERY 6 HOURS AS NEEDED FOR NAUSEA. 12/31/14   [name], MD  prochlorperazine (COMPAZINE) 10 MG tablet TAKE 1 TABLET BY MOUTH EVERY 6 (SIX) HOURS AS NEEDED FOR NAUSEA OR VOMITING. 06/16/15   [name], MD  sucralfate (CARAFATE) 1 G tablet Take 1 g by mouth 4 (four) times daily. Reported on 01/12/2015 12/16/14   Historical Provider, MD    Family History Family History  Problem Relation Age of Onset  . Heart disease Mother   . Heart disease Father   . Thyroid cancer Father     Social History Social History  Substance Use Topics  . Smoking status: Former Smoker    Packs/day: 1.00    Years: 30.00    Types: Cigarettes    Quit date: 01/29/2009  . Smokeless tobacco: Never Used  . Alcohol use 0.0 oz/week     Allergies   Codeine;  Penicillins; and Sulfa antibiotics   Review of Systems Review of Systems  Constitutional: Negative for appetite change, diaphoresis and fatigue.  HENT: Negative for congestion, ear discharge and sinus pressure.   Eyes: Negative for discharge.  Respiratory: Positive for shortness of breath. Negative for cough.   Cardiovascular: Positive for palpitations. Negative for chest pain.  Gastrointestinal: Negative for abdominal pain and diarrhea.  Genitourinary: Negative for frequency and hematuria.  Musculoskeletal: Negative for back pain.  Skin: Negative for rash.  Neurological: Negative for seizures and headaches.  Psychiatric/Behavioral: Negative for hallucinations.     Physical Exam Updated Vital Signs BP 146/91 (BP Location: Left Arm)   Pulse 85   Temp 98.9 F (37.2 C) (Oral)   Resp 19   SpO2 96%   Physical Exam  Constitutional: She is oriented to person,  place, and time. She appears well-developed.  HENT:  Head: Normocephalic.  Eyes: Conjunctivae and EOM are normal. No scleral icterus.  Neck: Neck supple. No thyromegaly present.  Cardiovascular: Normal rate and regular rhythm.  Exam reveals no gallop and no friction rub.   No murmur heard. Pulmonary/Chest: No stridor. She has no wheezes. She has no rales. She exhibits no tenderness.  Patient is sob  Abdominal: She exhibits no distension. There is no tenderness. There is no rebound.  Musculoskeletal: Normal range of motion. She exhibits no edema.  Lymphadenopathy:    She has no cervical adenopathy.  Neurological: She is oriented to person, place, and time. She exhibits normal muscle tone. Coordination normal.  Skin: No rash noted. No erythema.  Psychiatric: She has a normal mood and affect. Her behavior is normal.     ED Treatments / Results  Labs (all labs ordered are listed, but only abnormal results are displayed) Labs Reviewed  CBC WITH DIFFERENTIAL/PLATELET - Abnormal; Notable for the following:       Result Value   WBC 12.1 (*)    RBC 3.01 (*)    Hemoglobin 9.6 (*)    HCT 30.9 (*)    MCV 102.7 (*)    RDW 19.4 (*)    Platelets 115 (*)    Neutro Abs 10.4 (*)    All other components within normal limits  COMPREHENSIVE METABOLIC PANEL - Abnormal; Notable for the following:    Glucose, Bld 139 (*)    BUN 21 (*)    All other components within normal limits  BRAIN NATRIURETIC PEPTIDE - Abnormal; Notable for the following:    B Natriuretic Peptide 195.4 (*)    All other components within normal limits  D-DIMER, QUANTITATIVE (NOT AT [facility]) - Abnormal; Notable for the following:    D-Dimer, Quant 2.12 (*)    All other components within normal limits  I-STAT TROPOININ, ED    EKG  EKG Interpretation  Date/Time:  Thursday September 15 2015 15:30:20 EDT Ventricular Rate:  91 PR Interval:    QRS Duration: 101 QT Interval:  345 QTC Calculation: 425 R Axis:   62 Text  Interpretation:  Sinus rhythm Baseline wander in lead(s) V2 Confirmed by Yoskar Murrillo  MD, Ashyia Schraeder [phone]) on 09/15/2015 4:03:28 PM       Radiology Dg Chest 2 View  Result Date: 09/15/2015 CLINICAL DATA:  Shortness of breath worsening over the last 4-5 days. Right lung adenocarcinoma. Last chemo treatment was yesterday. EXAM: CHEST  2 VIEW COMPARISON:  08/15/2015 and CT from 08/17/2015 FINDINGS: The focal consolidation in the medial right upper lung has not significantly changed from the previous chest radiograph. Right jugular  Port-A-Cath tip is near the superior cavoatrial junction. No significant airspace disease. Question an enlarged nodule at the left lung base. Otherwise, no significant nodularity appreciated on this chest radiograph examination. Heart and mediastinum are stable. No large pleural effusions. No acute bone abnormality. IMPRESSION: No active cardiopulmonary disease. Chronic consolidation and densities at the right lung apex have minimally changed. Indeterminate nodular density at the left lung base. Patient has known pulmonary nodules but not clear if this represents a true parenchymal nodule. Electronically Signed   By: Markus Daft M.D.   On: 09/15/2015 15:41   Ct Angio Chest Pe W And/or Wo Contrast  Result Date: 09/15/2015 CLINICAL DATA:  Five-day history progressive shortness of breath and palpitations. Lung cancer. EXAM: CT ANGIOGRAPHY CHEST WITH CONTRAST TECHNIQUE: Multidetector CT imaging of the chest was performed using the standard protocol during bolus administration of intravenous contrast. Multiplanar CT image reconstructions and MIPs were obtained to evaluate the vascular anatomy. CONTRAST:  100 mL Isovue 370 COMPARISON:  None. FINDINGS: Cardiovascular: Heart size normal. Coronary artery calcifications are present. No significant pericardial effusion is present. Mediastinum/Nodes: Pulmonary arterial opacification is excellent. There is a focal filling defect within the posterior  basilar segmental artery in the right lower lobe compatible with pulmonary embolus. No other focal filling defects are present to suggest other pulmonary emboli. Atherosclerotic calcifications are present at the aortic arch. A right IJ Port-A-Cath is in place an accessed. The tip is in the right atrium An 18 mm subcarinal lymph node has increased in size. A small hilar node is present on the right, unchanged at 7 mm. No significant axillary adenopathy is present. Lungs/Pleura: A right apical lung mass is again noted. There is invasion of the chest wall. A right first rib fracture is stable. The tumor invades the right upper lobe bronchi. There is wall thickening into the right middle lobe bronchi as well. There is wall thickening into the right middle lobe and lower lobe bronchi as well. Multiple bilateral pulmonary nodules are stable the slightly decreased in size. Upper Abdomen: Multiple hyperdense liver lesions are present. A 14 mm lesion posteriorly in the right lobe of the liver on image 77 is stable. No definite new lesions are present. The visualized portions of the pancreas and spleen are within normal limits. The adrenal glands are normal bilaterally. Musculoskeletal: Sclerosis at C7 and T1 suggest metastatic disease to the bone. Multilevel facet degenerative changes are present. Vertebral body heights and alignment are maintained. Review of the MIP images confirms the above findings. IMPRESSION: 1. New right lower lobe segmental pulmonary embolus. 2. Heart size is normal. 3. Coronary artery disease. 4. Right apical lung mass with chest wall invasion invasion of the right upper lobe bronchi. 5. Stable to slight decrease in size of multiple pulmonary nodules compatible with metastatic disease. 6. Slight increased size of subcarinal lymph node. These results were called by telephone at the time of interpretation on 09/15/2015 at 8:14 pm to Dr. Milton Ferguson , who verbally acknowledged these results.  Electronically Signed   By: San Morelle M.D.   On: 09/15/2015 20:14    Procedures Procedures (including critical care time)  Medications Ordered in ED Medications  iopamidol (ISOVUE-370) 76 % injection 100 mL (100 mLs Intravenous Contrast Given 09/15/15 1934)   CRITICAL CARE Performed by: Nashira Mcglynn L Total critical care time: 86mnutes Critical care time was exclusive of separately billable procedures and treating other patients. Critical care was necessary to treat or prevent imminent or life-threatening deterioration. Critical  care was time spent personally by me on the following activities: development of treatment plan with patient and/or surrogate as well as nursing, discussions with consultants, evaluation of patient's response to treatment, examination of patient, obtaining history from patient or surrogate, ordering and performing treatments and interventions, ordering and review of laboratory studies, ordering and review of radiographic studies, pulse oximetry and re-evaluation of patient's condition.   Initial Impression / Assessment and Plan / ED Course  I have reviewed the triage vital signs and the nursing notes.  Pertinent labs & imaging results that were available during my care of the patient were reviewed by me and considered in my medical decision making (see chart for details).  Clinical Course   Patient with small pulmonary embolus. She will be admitted and put on IV heparin  Final Clinical Impressions(s) / ED Diagnoses   Final diagnoses:  PE (pulmonary embolism)    New Prescriptions New Prescriptions   No medications on file     Milton Ferguson, MD 09/15/15 2102

## 2015-09-16 ENCOUNTER — Ambulatory Visit: Payer: BLUE CROSS/BLUE SHIELD

## 2015-09-16 ENCOUNTER — Other Ambulatory Visit: Payer: BLUE CROSS/BLUE SHIELD

## 2015-09-16 ENCOUNTER — Observation Stay (HOSPITAL_BASED_OUTPATIENT_CLINIC_OR_DEPARTMENT_OTHER): Payer: BLUE CROSS/BLUE SHIELD

## 2015-09-16 DIAGNOSIS — I2699 Other pulmonary embolism without acute cor pulmonale: Secondary | ICD-10-CM

## 2015-09-16 DIAGNOSIS — C3411 Malignant neoplasm of upper lobe, right bronchus or lung: Secondary | ICD-10-CM

## 2015-09-16 DIAGNOSIS — R609 Edema, unspecified: Secondary | ICD-10-CM

## 2015-09-16 DIAGNOSIS — R0602 Shortness of breath: Secondary | ICD-10-CM | POA: Diagnosis not present

## 2015-09-16 DIAGNOSIS — D61810 Antineoplastic chemotherapy induced pancytopenia: Secondary | ICD-10-CM | POA: Diagnosis not present

## 2015-09-16 DIAGNOSIS — D72829 Elevated white blood cell count, unspecified: Secondary | ICD-10-CM | POA: Diagnosis not present

## 2015-09-16 DIAGNOSIS — I10 Essential (primary) hypertension: Secondary | ICD-10-CM

## 2015-09-16 LAB — BASIC METABOLIC PANEL
Anion gap: 9 (ref 5–15)
BUN: 15 mg/dL (ref 6–20)
CO2: 26 mmol/L (ref 22–32)
Calcium: 8.4 mg/dL — ABNORMAL LOW (ref 8.9–10.3)
Chloride: 100 mmol/L — ABNORMAL LOW (ref 101–111)
Creatinine, Ser: 0.9 mg/dL (ref 0.44–1.00)
GFR calc Af Amer: >60 mL/min (ref >60)
GLUCOSE: 129 mg/dL — AB (ref 65–99)
POTASSIUM: 3.2 mmol/L — AB (ref 3.5–5.1)
Sodium: 135 mmol/L (ref 135–145)

## 2015-09-16 LAB — CBC
HEMATOCRIT: 28.4 % — AB (ref 36.0–46.0)
HEMOGLOBIN: 8.9 g/dL — AB (ref 12.0–15.0)
MCH: 32.2 pg (ref 26.0–34.0)
MCHC: 31.3 g/dL (ref 30.0–36.0)
MCV: 102.9 fL — ABNORMAL HIGH (ref 78.0–100.0)
Platelets: 102 10*3/uL — ABNORMAL LOW (ref 150–400)
RBC: 2.76 MIL/uL — ABNORMAL LOW (ref 3.87–5.11)
RDW: 19.8 % — AB (ref 11.5–15.5)
WBC: 10.6 10*3/uL — AB (ref 4.0–10.5)

## 2015-09-16 LAB — PROTIME-INR
INR: 1.05
Prothrombin Time: 13.7 seconds (ref 11.4–15.2)

## 2015-09-16 LAB — POCT I-STAT TROPONIN I: TROPONIN I, POC: 0.03 ng/mL (ref 0.00–0.08)

## 2015-09-16 LAB — APTT: aPTT: 33 seconds (ref 24–36)

## 2015-09-16 MED ORDER — POTASSIUM CHLORIDE CRYS ER 20 MEQ PO TBCR
40.0000 meq | EXTENDED_RELEASE_TABLET | Freq: Once | ORAL | Status: AC
Start: 2015-09-16 — End: 2015-09-16
  Administered 2015-09-16: 40 meq via ORAL
  Filled 2015-09-16: qty 2

## 2015-09-16 MED ORDER — RIVAROXABAN 15 MG PO TABS
15.0000 mg | ORAL_TABLET | Freq: Two times a day (BID) | ORAL | Status: DC
  Administered 2015-09-16 – 2015-09-17 (×2): 15 mg via ORAL
  Filled 2015-09-16 (×2): qty 1

## 2015-09-16 MED ORDER — RIVAROXABAN 20 MG PO TABS: 20.0000 mg | ORAL_TABLET | Freq: Every day | ORAL | Status: DC

## 2015-09-16 NOTE — Discharge Instructions (Signed)
Information on my medicine - XARELTO (rivaroxaban)  This medication education was reviewed with me or my healthcare representative as part of my discharge preparation.   WHY WAS XARELTO PRESCRIBED FOR YOU? Xarelto was prescribed to treat blood clots that may have been found in the veins of your legs (deep vein thrombosis) or in your lungs (pulmonary embolism) and to reduce the risk of them occurring again.  What do you need to know about Xarelto? The starting dose is one 15 mg tablet taken TWICE daily with food for the FIRST 21 DAYS then on 10/07/2015  the dose is changed to one 20 mg tablet taken ONCE A DAY with your evening meal.  DO NOT stop taking Xarelto without talking to the health care provider who prescribed the medication.  Refill your prescription for 20 mg tablets before you run out.  After discharge, you should have regular check-up appointments with your healthcare provider that is prescribing your Xarelto.  In the future your dose may need to be changed if your kidney function changes by a significant amount.  What do you do if you miss a dose? If you are taking Xarelto TWICE DAILY and you miss a dose, take it as soon as you remember. You may take two 15 mg tablets (total 30 mg) at the same time then resume your regularly scheduled 15 mg twice daily the next day.  If you are taking Xarelto ONCE DAILY and you miss a dose, take it as soon as you remember on the same day then continue your regularly scheduled once daily regimen the next day. Do not take two doses of Xarelto at the same time.   Important Safety Information Xarelto is a blood thinner medicine that can cause bleeding. You should call your healthcare provider right away if you experience any of the following: ? Bleeding from an injury or your nose that does not stop. ? Unusual colored urine (red or dark brown) or unusual colored stools (red or black). ? Unusual bruising for unknown reasons. ? A serious fall or  if you hit your head (even if there is no bleeding).  Some medicines may interact with Xarelto and might increase your risk of bleeding while on Xarelto. To help avoid this, consult your healthcare provider or pharmacist prior to using any new prescription or non-prescription medications, including herbals, vitamins, non-steroidal anti-inflammatory drugs (NSAIDs) and supplements.  This website has more information on Xarelto: [email or web address]

## 2015-09-16 NOTE — Progress Notes (Signed)
Jasmine Grant   DOB:12-11-1955   KG#:[id number]    This is a patient well known to me. She was admitted to the hospital overnight due to diagnosis of PE. 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      12/13/2013 Imaging    CT scan show large mass in the right lung apex likely representing primary lung carcinoma or large metastasis. Metastases demonstrated in the pretracheal lymph nodes, T1 vertebral body and possibly sacrum.      12/14/2013 Initial Diagnosis    Adenocarcinoma of upper lobe of right lung      12/14/2013 Pathology Results    Accession: [id number] biopsy from right upper lobe showed adenocarcinoma. [facility] testing was positive for several mutations without any known treatment options now      12/17/2013 Imaging    MRI of the head is negative.      12/23/2013 - 02/05/2014 Radiation Therapy    She received radiation therapy to the mediastinal mass and bone      12/28/2013 Imaging    PET/CT scan showed mediastinal mass, lymphadenopathy and T1 involvement.      01/01/2014 - 01/29/2014 Chemotherapy    Weekly carboplatin and Taxol were added      03/12/2014 Imaging    Repeat PET scan showed near complete response to treatment.      03/16/2014 - 05/28/2014 Chemotherapy    She started treatment with Alimta for maintenance      07/07/2014 Imaging    PET scan showed disease progression      07/15/2014 - 09/23/2014 Chemotherapy    She received palliative treatment with Opdivo      08/13/2014 - 08/25/2014  Radiation Therapy    She completed palliative radiation therapy to the bone.      10/05/2014 Imaging    PET CT showed disease progression except for site of recent radiation therapy      10/13/2014 - 04/06/2015 Chemotherapy    She received Rx with weekly carbo/taxol, 2 weeks on 1 week off with Avastin every 3 weeks      12/15/2014 Imaging    PET scan showed positive response to Rx      01/18/2015 Imaging    Ct chest showed disease improvement      04/19/2015 Imaging    Ct chest diease progression      04/27/2015 -  Chemotherapy    Treatment is switched to Gemzar and Navelbine      05/04/2015 Adverse Reaction    Treatment was missed & interrupted due to recurrent UTI      06/13/2015 Imaging    MRI head is negative      08/17/2015 Imaging    CT imaging showed positive response to Rx      Subjective: The patient have some palpitation and shortness of breath on minimal exertion. That stays about the same. She denies pleuritic chest pain. Denies dizziness or hemoptysis  Objective:  Vitals:   09/16/15 0000 09/16/15 2595  BP: (!) 148/72 135/77  Pulse: 84 80  Resp:  20  Temp:  99 F (37.2 C)    No intake or output data in the 24 hours ending 09/16/15 0812  GENERAL:alert, no distress and comfortable. She appears Cushingoid SKIN: skin color, texture, turgor are normal, no rashes or significant lesions EYES: normal, Conjunctiva are pink and non-injected, sclera clear OROPHARYNX:no exudate, no erythema and lips, buccal mucosa, and tongue normal  NECK: supple, thyroid normal size, non-tender, without nodularity LYMPH:  no palpable lymphadenopathy in the cervical, axillary or inguinal LUNGS: clear to auscultation and percussion with normal breathing effort HEART: Tachycardia, regular rate without murmurs, bilateral lower extremity edema ABDOMEN:abdomen soft, non-tender and normal bowel sounds Musculoskeletal:no cyanosis of digits and no clubbing  NEURO: alert & oriented x 3  with fluent speech, no focal motor/sensory deficits   Labs:  Lab Results  Component Value Date   WBC 10.6 (H) 09/16/2015   HGB 8.9 (L) 09/16/2015   HCT 28.4 (L) 09/16/2015   MCV 102.9 (H) 09/16/2015   PLT 102 (L) 09/16/2015   NEUTROABS 10.4 (H) 09/15/2015    Lab Results  Component Value Date   NA 135 09/16/2015   K 3.2 (L) 09/16/2015   CL 100 (L) 09/16/2015   CO2 26 09/16/2015    Studies:  Dg Chest 2 View  Result Date: 09/15/2015 CLINICAL DATA:  Shortness of breath worsening over the last 4-5 days. Right lung adenocarcinoma. Last chemo treatment was yesterday. EXAM: CHEST  2 VIEW COMPARISON:  08/15/2015 and CT from 08/17/2015 FINDINGS: The focal consolidation in the medial right upper lung has not significantly changed from the previous chest radiograph. Right jugular Port-A-Cath tip is near the superior cavoatrial junction. No significant airspace disease. Question an enlarged nodule at the left lung base. Otherwise, no significant nodularity appreciated on this chest radiograph examination. Heart and mediastinum are stable. No large pleural effusions. No acute bone abnormality. IMPRESSION: No active cardiopulmonary disease. Chronic consolidation and densities at the right lung apex have minimally changed. Indeterminate nodular density at the left lung base. Patient has known pulmonary nodules but not clear if this represents a true parenchymal nodule. Electronically Signed   By: Markus Daft M.D.   On: 09/15/2015 15:41   Ct Angio Chest Pe W And/or Wo Contrast  Result Date: 09/15/2015 CLINICAL DATA:  Five-day history progressive shortness of breath and palpitations. Lung cancer. EXAM: CT ANGIOGRAPHY CHEST WITH CONTRAST TECHNIQUE: Multidetector CT imaging of the chest was performed using the standard protocol during bolus administration of intravenous contrast. Multiplanar CT image reconstructions and MIPs were obtained to evaluate the vascular anatomy. CONTRAST:  100 mL Isovue 370  COMPARISON:  None. FINDINGS: Cardiovascular: Heart size normal. Coronary artery calcifications are present. No significant pericardial effusion is present. Mediastinum/Nodes: Pulmonary arterial opacification is excellent. There is a focal filling defect within the posterior basilar segmental artery in the right lower lobe compatible with pulmonary embolus. No other focal filling defects are present to suggest other pulmonary emboli. Atherosclerotic calcifications are present at the aortic arch. A right IJ Port-A-Cath is in place an accessed. The tip is in the right atrium An 18 mm subcarinal lymph node has increased in size. A small hilar node is present on the right, unchanged at 7 mm. No significant axillary adenopathy is present. Lungs/Pleura: A right apical lung mass is again noted. There is invasion of the chest wall. A right first rib fracture is stable. The tumor invades the right upper lobe bronchi. There  is wall thickening into the right middle lobe bronchi as well. There is wall thickening into the right middle lobe and lower lobe bronchi as well. Multiple bilateral pulmonary nodules are stable the slightly decreased in size. Upper Abdomen: Multiple hyperdense liver lesions are present. A 14 mm lesion posteriorly in the right lobe of the liver on image 77 is stable. No definite new lesions are present. The visualized portions of the pancreas and spleen are within normal limits. The adrenal glands are normal bilaterally. Musculoskeletal: Sclerosis at C7 and T1 suggest metastatic disease to the bone. Multilevel facet degenerative changes are present. Vertebral body heights and alignment are maintained. Review of the MIP images confirms the above findings. IMPRESSION: 1. New right lower lobe segmental pulmonary embolus. 2. Heart size is normal. 3. Coronary artery disease. 4. Right apical lung mass with chest wall invasion invasion of the right upper lobe bronchi. 5. Stable to slight decrease in size of  multiple pulmonary nodules compatible with metastatic disease. 6. Slight increased size of subcarinal lymph node. These results were called by telephone at the time of interpretation on 09/15/2015 at 8:14 pm to Dr. Milton Ferguson , who verbally acknowledged these results. Electronically Signed   By: San Morelle M.D.   On: 09/15/2015 20:14    Assessment & Plan:   Pulmonary embolus It does not surprise me given her active disease that she has a new PE I agree with placing her on Lovenox for now. If no invasive procedure is anticipated over the next 24-48 hours, I recommend transitioning her to oral anticoagulation therapy. Xarelto would be a good option for her.  Adenocarcinoma of upper lobe of right lung Recent CT scan show improved disease control. She is due for outpatient chemotherapy today and I will cancel her treatment as the patient is currently hospitalized She is scheduled to see me with outpatient treatment due next on 09/28/2015 and she will keep that appointment Plan to repeat imaging study again in 3 months, next due around October 2017  Pancytopenia due to antineoplastic chemotherapy This is likely due to recent treatment. The patient denies recent history of bleeding such as epistaxis, hematuria or hematochezia. She is asymptomatic from the anemia. I will observe for now.  She does not require transfusion now. I will continue the chemotherapy at current dose without dosage adjustment.   Essential hypertension Her blood pressure fluctuated widely with therapy. Continue current treatment.  CODE STATUS We discussed CODE STATUS extensively. Today, she wants to be full code  Leukocytosis This is likely due to recent Neulasta injection and stress reaction. Observe only  Discharge planning If her vitals remained stable, hopefully she can be discharged this weekend I will sign off  [facility], [place], MD 09/16/2015  8:12 AM

## 2015-09-16 NOTE — Progress Notes (Signed)
[facility] Progress Note  Patient: Jasmine Grant WIO:[id number]   PCP: Reginia Naas, MD DOB: 10-28-1955   DOA: 09/15/2015   DOS: 09/16/2015   Date of Service: the patient was seen and examined on 09/16/2015  Subjective: Patient denies any chest pain denies any shortness of breath at rest. Denies any nausea or vomiting. No diarrhea no constipation no leg pain. Nutrition: Tolerating oral diet  Brief hospital course: Pt. with PMH of adenocarcinoma of the right lung; admitted on 09/15/2015, with complaint of shortness of breath on exertion, was found to have right pulmonary embolism. Currently further plan is monitoring adverse reaction of blood thinners.  Assessment and Plan: 1. Right pulmonary embolism. Patient with lung cancer coming in with shortness of breath, CT scan was positive for pulmonary embolism. Lower extremity Doppler is negative for DVT. Started on Lovenox I will transition the patient to Xarelto as recommended by oncology. No procedure planned at present. Monitor in the hospital for 24 hours.  2. Hypothyroidism. Continuing Synthroid.  3. COPD. Continue home inhalers. No evidence of acute exacerbation.   Pain management: When necessary Tylenol Activity: No indication of physical therapy Bowel regimen: last BM 09/15/2015 Diet: Regular diet  Advance goals of care discussion: Full code  Family Communication: no family was present at bedside, at the time of interview.  Disposition:  Discharge to home. Expected discharge date: 09/17/2015  Consultants: Medical oncology Procedures: none  Antibiotics: Anti-infectives    None        Intake/Output Summary (Last 24 hours) at 09/16/15 1637 Last data filed at 09/16/15 1230  Gross per 24 hour  Intake              360 ml  Output                0 ml  Net              360 ml   Filed Weights   09/15/15 2216  Weight: 105 kg (231 lb 7.7 oz)    Objective: Physical Exam: Vitals:   09/15/15 2216  09/16/15 0000 09/16/15 0636 09/16/15 1300  BP: (!) 194/94 (!) 148/72 135/77 131/76  Pulse: 94 84 80 90  Resp: '18  20 20  '$ Temp: 98.8 F (37.1 C)  99 F (37.2 C) 98.7 F (37.1 C)  TempSrc: Oral  Oral Oral  SpO2: 94%  94% 96%  Weight: 105 kg (231 lb 7.7 oz)     Height: '6\' 2"'$  (1.88 m)       General: Alert, Awake and Oriented to Time, Place and Person. Appear in mild distress Eyes: PERRL, Conjunctiva normal ENT: Oral Mucosa clear moist. Neck: no JVD, no Abnormal Mass Or lumps Cardiovascular: S1 and S2 Present, no Murmur, Respiratory: Bilateral Air entry equal and Decreased, Clear to Auscultation, no Crackles, no wheezes Abdomen: Bowel Sound present, Soft and no tenderness Skin: no redness, no Rash  Extremities: no Pedal edema, no calf tenderness Neurologic: Grossly no focal neuro deficit. Bilaterally Equal motor strength  Data Reviewed: CBC:  Recent Labs Lab 09/15/15 1700 09/16/15 0300  WBC 12.1* 10.6*  NEUTROABS 10.4*  --   HGB 9.6* 8.9*  HCT 30.9* 28.4*  MCV 102.7* 102.9*  PLT 115* 426*   Basic Metabolic Panel:  Recent Labs Lab 09/15/15 1700 09/16/15 0300  NA 140 135  K 3.5 3.2*  CL 104 100*  CO2 27 26  GLUCOSE 139* 129*  BUN 21* 15  CREATININE 0.96 0.90  CALCIUM 9.0 8.4*  Liver Function Tests:  Recent Labs Lab 09/15/15 1700  AST 22  ALT 39  ALKPHOS 96  BILITOT 0.5  PROT 7.2  ALBUMIN 3.9   No results for input(s): LIPASE, AMYLASE in the last 168 hours. No results for input(s): AMMONIA in the last 168 hours. Coagulation Profile:  Recent Labs Lab 09/16/15 0300  INR 1.05   Cardiac Enzymes: No results for input(s): CKTOTAL, CKMB, CKMBINDEX, TROPONINI in the last 168 hours. BNP (last 3 results) No results for input(s): PROBNP in the last 8760 hours.  CBG: No results for input(s): GLUCAP in the last 168 hours.  Studies: Ct Angio Chest Pe W And/or Wo Contrast  Result Date: 09/15/2015 CLINICAL DATA:  Five-day history progressive shortness  of breath and palpitations. Lung cancer. EXAM: CT ANGIOGRAPHY CHEST WITH CONTRAST TECHNIQUE: Multidetector CT imaging of the chest was performed using the standard protocol during bolus administration of intravenous contrast. Multiplanar CT image reconstructions and MIPs were obtained to evaluate the vascular anatomy. CONTRAST:  100 mL Isovue 370 COMPARISON:  None. FINDINGS: Cardiovascular: Heart size normal. Coronary artery calcifications are present. No significant pericardial effusion is present. Mediastinum/Nodes: Pulmonary arterial opacification is excellent. There is a focal filling defect within the posterior basilar segmental artery in the right lower lobe compatible with pulmonary embolus. No other focal filling defects are present to suggest other pulmonary emboli. Atherosclerotic calcifications are present at the aortic arch. A right IJ Port-A-Cath is in place an accessed. The tip is in the right atrium An 18 mm subcarinal lymph node has increased in size. A small hilar node is present on the right, unchanged at 7 mm. No significant axillary adenopathy is present. Lungs/Pleura: A right apical lung mass is again noted. There is invasion of the chest wall. A right first rib fracture is stable. The tumor invades the right upper lobe bronchi. There is wall thickening into the right middle lobe bronchi as well. There is wall thickening into the right middle lobe and lower lobe bronchi as well. Multiple bilateral pulmonary nodules are stable the slightly decreased in size. Upper Abdomen: Multiple hyperdense liver lesions are present. A 14 mm lesion posteriorly in the right lobe of the liver on image 77 is stable. No definite new lesions are present. The visualized portions of the pancreas and spleen are within normal limits. The adrenal glands are normal bilaterally. Musculoskeletal: Sclerosis at C7 and T1 suggest metastatic disease to the bone. Multilevel facet degenerative changes are present. Vertebral body  heights and alignment are maintained. Review of the MIP images confirms the above findings. IMPRESSION: 1. New right lower lobe segmental pulmonary embolus. 2. Heart size is normal. 3. Coronary artery disease. 4. Right apical lung mass with chest wall invasion invasion of the right upper lobe bronchi. 5. Stable to slight decrease in size of multiple pulmonary nodules compatible with metastatic disease. 6. Slight increased size of subcarinal lymph node. These results were called by telephone at the time of interpretation on 09/15/2015 at 8:14 pm to Dr. Milton Ferguson , who verbally acknowledged these results. Electronically Signed   By: San Morelle M.D.   On: 09/15/2015 20:14     Scheduled Meds: . amLODipine  10 mg Oral QHS  . famotidine  20 mg Oral BID  . fluticasone furoate-vilanterol  1 puff Inhalation Daily  . hydrochlorothiazide  25 mg Oral Daily  . levothyroxine  200 mcg Oral QAC breakfast  . lisinopril  10 mg Oral QHS  . LORazepam  0.5 mg Oral BID  .  mirtazapine  15 mg Oral QHS  . predniSONE  20 mg Oral Q breakfast  . rivaroxaban  15 mg Oral BID WC   Followed by  . [START ON 10/07/2015] rivaroxaban  20 mg Oral Q supper  . sodium chloride flush  3 mL Intravenous Q12H   Continuous Infusions:  PRN Meds: acetaminophen **OR** acetaminophen, albuterol, ondansetron **OR** ondansetron (ZOFRAN) IV, oxyCODONE, prochlorperazine, sodium chloride flush  Time spent: 30 minutes  Author: Berle Mull, MD [facility] Pager: [phone] 09/16/2015 4:37 PM  If 7PM-7AM, please contact night-coverage at [email or web address], password [facility]

## 2015-09-16 NOTE — Progress Notes (Signed)
ANTICOAGULATION CONSULT NOTE - Initial Consult  Pharmacy Consult for lovenox --> Xarelto Indication: new pulmonary embolus  Allergies  Allergen Reactions  . Codeine Nausea And Vomiting and Other (See Comments)    Any medication that is a codeine derivative like Hydrocodone, etc.  . Penicillins     Unknown reaction as a child  . Sulfa Antibiotics Nausea And Vomiting    Patient Measurements: Height: '6\' 2"'$  (188 cm) Weight: 231 lb 7.7 oz (105 kg) IBW/kg (Calculated) : 77.7 Heparin Dosing Weight:  Vital Signs: Temp: 99 F (37.2 C) (08/18 0636) Temp Source: Oral (08/18 0636) BP: 135/77 (08/18 0636) Pulse Rate: 80 (08/18 0636)  Labs:  Recent Labs  09/15/15 1700 09/16/15 0300  HGB 9.6* 8.9*  HCT 30.9* 28.4*  PLT 115* 102*  APTT  --  33  LABPROT  --  13.7  INR  --  1.05  CREATININE 0.96 0.90    Estimated Creatinine Clearance: 91.8 mL/min (by C-G formula based on SCr of 0.9 mg/dL).   Medical History: Past Medical History:  Diagnosis Date  . Arthritis 04/27/2014  . Drug-induced hypokalemia 05/26/2015  . Dysuria 11/03/2014  . Essential hypertension 04/27/2014  . Headache disorder 06/08/2015  . Hx of thyroid cancer    10 years ago  . Insomnia 12/29/2013  . Lung cancer ([facility]) 12/14/13   Adenocarcinoma of right upper lobe mediastinal mass and bone   . 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 Panic attack   . S/P radiation therapy completed 12/16/13-02/05/14   RULL lung  . Thyroid ca [facility]) dx'd 20+yrs ago   surg only  . UTI (lower urinary tract infection) 05/26/2015  . UTI (urinary tract infection) 04/29/2015    Assessment: Patient is a 60 y.o F with lung cancer currently undergoing chemotherapy treatment, presented to the ED on 8/17 with c/o SOB.  Chest CTA showed new RLL segmental PE.  LE doppler negative for DVT.  Lovenox started on admission for thrombus.  Patient remains stable and no invasive procedure is planned for patient -- to transition to xarelto on 8/18 per MD's  request.    Plan:  - d/c lovenox  - start xarelto 15 mg bid x21 days, then 20 mg daily (give first dose at [place] since last dose of lovenox given at 0925 this AM) - monitor for s/s bleeding  Jasmine Grant P 09/16/2015,10:02 AM

## 2015-09-16 NOTE — Progress Notes (Signed)
*  PRELIMINARY RESULTS* Vascular Ultrasound Lower extremity venous duplex has been completed.  Preliminary findings: No evidence of DVT or baker's cyst.  Landry Mellow, RDMS, RVT  09/16/2015, 9:17 AM

## 2015-09-17 ENCOUNTER — Ambulatory Visit: Payer: BLUE CROSS/BLUE SHIELD

## 2015-09-17 DIAGNOSIS — I2699 Other pulmonary embolism without acute cor pulmonale: Secondary | ICD-10-CM | POA: Diagnosis not present

## 2015-09-17 LAB — CBC
HEMATOCRIT: 29.3 % — AB (ref 36.0–46.0)
Hemoglobin: 9.1 g/dL — ABNORMAL LOW (ref 12.0–15.0)
MCH: 31.6 pg (ref 26.0–34.0)
MCHC: 31.1 g/dL (ref 30.0–36.0)
MCV: 101.7 fL — AB (ref 78.0–100.0)
Platelets: 95 10*3/uL — ABNORMAL LOW (ref 150–400)
RBC: 2.88 MIL/uL — ABNORMAL LOW (ref 3.87–5.11)
RDW: 19.6 % — AB (ref 11.5–15.5)
WBC: 9.3 10*3/uL (ref 4.0–10.5)

## 2015-09-17 MED ORDER — RIVAROXABAN (XARELTO) VTE STARTER PACK (15 & 20 MG): ORAL_TABLET | ORAL | 0 refills | Status: DC

## 2015-09-17 MED ORDER — HEPARIN SOD (PORK) LOCK FLUSH 100 UNIT/ML IV SOLN
500.0000 [IU] | INTRAVENOUS | Status: AC | PRN
  Administered 2015-09-17: 500 [IU]

## 2015-09-17 NOTE — Care Management Note (Signed)
Case Management Note  Patient Details  Name: Jasmine Grant MRN: [id number] Date of Birth: [birth year]  Subjective/Objective:     PE               Action/Plan: :Discharge Planning: AVS reviewed NCM contacted pt at home. She did have 30 day free trial card for Xarelto. Explained [employer] representative also can utilize the copay card. She will check the package that was given at dc. Explained she can also to the [facility] website to sign up for copay card.   Expected Discharge Date:  09/17/2015             Expected Discharge Plan:  Home/Self Care  In-House Referral:  NA  Discharge planning Services  CM Consult, Medication Assistance  Post Acute Care Choice:  NA Choice offered to:  NA  DME Arranged:  N/A DME Agency:  NA  HH Arranged:  NA HH Agency:  NA  Status of Service:  Completed, signed off  If discussed at [place] of Stay Meetings, dates discussed:    Additional Comments:  Erenest Rasher, RN 09/17/2015, 8:02 PM

## 2015-09-17 NOTE — Progress Notes (Signed)
Reviewed discharge information with patient. Answered all questions. Patient able to teach back medications and reasons to contact MD/911. Patient verbalizes importance of PCP follow up appointment.  Kaylor Simenson M. Ghadeer Kastelic, RN   

## 2015-09-20 NOTE — Discharge Summary (Signed)
[facility] Discharge Summary   Patient: Jasmine Grant NUU:[id number]   PCP: Reginia Naas, MD DOB: [birth year]   Date of admission: 09/15/2015   Date of discharge: 09/17/2015   Discharge Diagnoses:  Active Problems:   Acquired hypothyroidism   Adenocarcinoma of upper lobe of right lung   Pancytopenia due to antineoplastic chemotherapy ([place])   Pulmonary embolism (HCC)   Pulmonary embolus ([place])   Admitted From: Home Disposition:  Home  Recommendations for Outpatient Follow-up:  1. Follow-up with PCP with CBC to monitor on platelets   Follow-up Information    Reginia Naas, MD. Schedule an appointment as soon as possible for a visit in 1 week(s).   Specialty:  Family Medicine Why:  with CBC Contact information: [place] [id number] [phone]          Diet recommendation: Regular diet  Activity: The patient is advised to gradually reintroduce usual activities.  Discharge Condition: good  Code Status: Full code  History of present illness: As per the H and P dictated on admission, " Jasmine Grant is a 60 y.o. 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 Alvy Bimler, diagnosed in 2015, S/P multiple rounds of chemo and palliative radiation, currently being treated with Gemzar and Navelbine), remote thyroid cancer S/P thyroidectomy, and HTN (reportedly takes her anti-hypertensives on a prn basis at home) who presents for evaluation of progressive shortness of breath at rest and dyspnea on exertion for the past weeks.  She reports chronic lower extremity swelling since starting chemotherapy and had not noticed a change from baseline.  No particular leg or calf pain.  Remarkably, she has not had hypoxia.  No syncope or LOC"  Hospital Course:  Summary of her active problems in the hospital is as following.   Adenocarcinoma of upper lobe of right lung   Pancytopenia due to antineoplastic  chemotherapy [facility])   Pulmonary embolism [facility]) Patient presented with complaints of exertional shortness of breath. In the ER she was found to be having a right pulmonary embolism. Lower extremity Doppler was negative for any DVT. Patient was not showing any hemodynamic instability or any other acute complaint. Patient was initially started on Lovenox and as per recommendation from the oncology patient was transitioned to Xarelto as no procedure was planned. Patient remained hemodynamically stable and therefore safe to be discharged. Has low platelets and will require repeat CBC in 1 week to monitor on the same.  All other chronic medical condition were stable during the hospitalization.  Patient was ambulatory without any assistance. On the day of the discharge the patient's vitals were stable, and no other acute medical condition were reported by patient. the patient was felt safe to be discharge at home with family.  Procedures and Results:  None   Consultations:  Medical oncology  DISCHARGE MEDICATION: Discharge Medication List as of 09/17/2015 10:49 AM    START taking these medications   Details  Rivaroxaban 15 & 20 MG TBPK Take as directed on package: Start with one '15mg'$  tablet by mouth twice a day with food. On Day 22, switch to one '20mg'$  tablet once a day with food., Print      CONTINUE these medications which have NOT CHANGED   Details  albuterol (PROVENTIL HFA;VENTOLIN HFA) 108 (90 BASE) MCG/ACT inhaler Inhale 2 puffs into the lungs every 4 (four) hours as needed for wheezing or shortness of breath., Starting Thu 11/25/2014, Normal  amLODipine (NORVASC) 10 MG tablet Take 1 tablet (10 mg total) by mouth at bedtime., Starting Wed 07/13/2015, Normal    fluticasone (FLONASE) 50 MCG/ACT nasal spray Place 2 sprays into both nostrils daily., Starting Thu 11/25/2014, Normal    Fluticasone Furoate-Vilanterol (BREO ELLIPTA) 200-25 MCG/INH AEPB Inhale 1 puff into the lungs daily.  Reported on 04/20/2015, Historical Med    hydrochlorothiazide (HYDRODIURIL) 25 MG tablet TAKE 1 TABLET BY MOUTH EVERY DAY, Normal    levothyroxine (SYNTHROID, LEVOTHROID) 200 MCG tablet TAKE 1 TABLET BY MOUTH DAILY BEFORE BREAKFAST., Normal    lisinopril (PRINIVIL) 10 MG tablet Take 1 tablet (10 mg total) by mouth at bedtime., Starting Wed 07/13/2015, Normal    LORazepam (ATIVAN) 0.5 MG tablet TAKE 1 TABLET BY MOUTH EVERY 8 HOURS AS NEEDED FOR ANXIETY OR FOR NAUSEA, Print    mirtazapine (REMERON) 15 MG tablet TAKE 1 TABLET (15 MG TOTAL) BY MOUTH AT BEDTIME AS NEEDED (FOR SLEEP.)., Normal    ondansetron (ZOFRAN) 8 MG tablet TAKE 1 TABLET BY MOUTH EVERY 6 HOURS AS NEEDED FOR NAUSEA., Normal    predniSONE (DELTASONE) 20 MG tablet Take 1 tablet (20 mg total) by mouth daily with breakfast., Starting Fri 08/26/2015, Normal    prochlorperazine (COMPAZINE) 10 MG tablet TAKE 1 TABLET BY MOUTH EVERY 6 (SIX) HOURS AS NEEDED FOR NAUSEA OR VOMITING., Normal    ranitidine (ZANTAC) 150 MG tablet Take 1 tablet (150 mg total) by mouth every evening., Starting Fri 12/31/2014, Normal       Allergies  Allergen Reactions  . Codeine Nausea And Vomiting and Other (See Comments)    Any medication that is a codeine derivative like Hydrocodone, etc.  . Penicillins     Unknown reaction as a child  . Sulfa Antibiotics Nausea And Vomiting   Discharge Instructions    Diet - low sodium heart healthy    Complete by:  As directed   Discharge instructions    Complete by:  As directed   It is important that you read following instructions as well as go over your medication list with RN to help you understand your care after this hospitalization.  Discharge Instructions: Please follow-up with PCP in one week, get CBC done in 1 week  Please request your primary care physician to go over all Hospital Tests and Procedure/Radiological results at the follow up,  Please get all Hospital records sent to your PCP by signing  hospital release before you go home.   Do not take more than prescribed Pain, Sleep and Anxiety Medications. You were cared for by a hospitalist during your hospital stay. If you have any questions about your discharge medications or the care you received while you were in the hospital after you are discharged, you can call the unit and ask to speak with the hospitalist on call if the hospitalist that took care of you is not available.  Once you are discharged, your primary care physician will handle any further medical issues. Please note that NO REFILLS for any discharge medications will be authorized once you are discharged, as it is imperative that you return to your primary care physician (or establish a relationship with a primary care physician if you do not have one) for your aftercare needs so that they can reassess your need for medications and monitor your lab values. You Must read complete instructions/literature along with all the possible adverse reactions/side effects for all the Medicines you take and that have been prescribed to you. Take  any new Medicines after you have completely understood and accept all the possible adverse reactions/side effects. Wear Seat belts while driving. If you have smoked or chewed Tobacco in the last 2 yrs please stop smoking and/or stop any Recreational drug use.   Increase activity slowly    Complete by:  As directed     Discharge Exam: Filed Weights   09/15/15 2216  Weight: 105 kg (231 lb 7.7 oz)   Vitals:   09/16/15 2107 09/17/15 0651  BP: 133/80 122/62  Pulse: 75 69  Resp: 18 16  Temp: 98.7 F (37.1 C) 98 F (36.7 C)   General: Appear in no distress, no Rash; Oral Mucosa moist. Cardiovascular: S1 and S2 Present, no Murmur, no JVD Respiratory: Bilateral Air entry present and Clear to Auscultation, no Crackles, no wheezes Abdomen: Bowel Sound present, Soft and no tenderness Extremities: no Pedal edema, no calf tenderness Neurology:  Grossly no focal neuro deficit.  The results of significant diagnostics from this hospitalization (including imaging, microbiology, ancillary and laboratory) are listed below for reference.    Significant Diagnostic Studies: Dg Chest 2 View  Result Date: 09/15/2015 CLINICAL DATA:  Shortness of breath worsening over the last 4-5 days. Right lung adenocarcinoma. Last chemo treatment was yesterday. EXAM: CHEST  2 VIEW COMPARISON:  08/15/2015 and CT from 08/17/2015 FINDINGS: The focal consolidation in the medial right upper lung has not significantly changed from the previous chest radiograph. Right jugular Port-A-Cath tip is near the superior cavoatrial junction. No significant airspace disease. Question an enlarged nodule at the left lung base. Otherwise, no significant nodularity appreciated on this chest radiograph examination. Heart and mediastinum are stable. No large pleural effusions. No acute bone abnormality. IMPRESSION: No active cardiopulmonary disease. Chronic consolidation and densities at the right lung apex have minimally changed. Indeterminate nodular density at the left lung base. Patient has known pulmonary nodules but not clear if this represents a true parenchymal nodule. Electronically Signed   By: Markus Daft M.D.   On: 09/15/2015 15:41   Ct Angio Chest Pe W And/or Wo Contrast  Result Date: 09/15/2015 CLINICAL DATA:  Five-day history progressive shortness of breath and palpitations. Lung cancer. EXAM: CT ANGIOGRAPHY CHEST WITH CONTRAST TECHNIQUE: Multidetector CT imaging of the chest was performed using the standard protocol during bolus administration of intravenous contrast. Multiplanar CT image reconstructions and MIPs were obtained to evaluate the vascular anatomy. CONTRAST:  100 mL Isovue 370 COMPARISON:  None. FINDINGS: Cardiovascular: Heart size normal. Coronary artery calcifications are present. No significant pericardial effusion is present. Mediastinum/Nodes: Pulmonary arterial  opacification is excellent. There is a focal filling defect within the posterior basilar segmental artery in the right lower lobe compatible with pulmonary embolus. No other focal filling defects are present to suggest other pulmonary emboli. Atherosclerotic calcifications are present at the aortic arch. A right IJ Port-A-Cath is in place an accessed. The tip is in the right atrium An 18 mm subcarinal lymph node has increased in size. A small hilar node is present on the right, unchanged at 7 mm. No significant axillary adenopathy is present. Lungs/Pleura: A right apical lung mass is again noted. There is invasion of the chest wall. A right first rib fracture is stable. The tumor invades the right upper lobe bronchi. There is wall thickening into the right middle lobe bronchi as well. There is wall thickening into the right middle lobe and lower lobe bronchi as well. Multiple bilateral pulmonary nodules are stable the slightly decreased in size. Upper  Abdomen: Multiple hyperdense liver lesions are present. A 14 mm lesion posteriorly in the right lobe of the liver on image 77 is stable. No definite new lesions are present. The visualized portions of the pancreas and spleen are within normal limits. The adrenal glands are normal bilaterally. Musculoskeletal: Sclerosis at C7 and T1 suggest metastatic disease to the bone. Multilevel facet degenerative changes are present. Vertebral body heights and alignment are maintained. Review of the MIP images confirms the above findings. IMPRESSION: 1. New right lower lobe segmental pulmonary embolus. 2. Heart size is normal. 3. Coronary artery disease. 4. Right apical lung mass with chest wall invasion invasion of the right upper lobe bronchi. 5. Stable to slight decrease in size of multiple pulmonary nodules compatible with metastatic disease. 6. Slight increased size of subcarinal lymph node. These results were called by telephone at the time of interpretation on 09/15/2015 at  8:14 pm to Dr. Milton Ferguson , who verbally acknowledged these results. Electronically Signed   By: San Morelle M.D.   On: 09/15/2015 20:14    Microbiology: No results found for this or any previous visit (from the past 240 hour(s)).   Labs: CBC:  Recent Labs Lab 09/15/15 1700 09/16/15 0300 09/17/15 0350  WBC 12.1* 10.6* 9.3  NEUTROABS 10.4*  --   --   HGB 9.6* 8.9* 9.1*  HCT 30.9* 28.4* 29.3*  MCV 102.7* 102.9* 101.7*  PLT 115* 102* 95*   Basic Metabolic Panel:  Recent Labs Lab 09/15/15 1700 09/16/15 0300  NA 140 135  K 3.5 3.2*  CL 104 100*  CO2 27 26  GLUCOSE 139* 129*  BUN 21* 15  CREATININE 0.96 0.90  CALCIUM 9.0 8.4*   Liver Function Tests:  Recent Labs Lab 09/15/15 1700  AST 22  ALT 39  ALKPHOS 96  BILITOT 0.5  PROT 7.2  ALBUMIN 3.9   No results for input(s): LIPASE, AMYLASE in the last 168 hours. No results for input(s): AMMONIA in the last 168 hours. Cardiac Enzymes: No results for input(s): CKTOTAL, CKMB, CKMBINDEX, TROPONINI in the last 168 hours. BNP (last 3 results)  Recent Labs  09/15/15 1700  BNP 195.4*   CBG: No results for input(s): GLUCAP in the last 168 hours. Time spent: 30 minutes  Signed:  Brandell Maready  [facility] 09/17/2015 , 8:45 AM

## 2015-09-22 ENCOUNTER — Encounter: Payer: Self-pay | Admitting: Emergency Medicine

## 2015-09-22 ENCOUNTER — Ambulatory Visit (INDEPENDENT_AMBULATORY_CARE_PROVIDER_SITE_OTHER): Payer: BLUE CROSS/BLUE SHIELD | Admitting: Emergency Medicine

## 2015-09-22 DIAGNOSIS — C3411 Malignant neoplasm of upper lobe, right bronchus or lung: Secondary | ICD-10-CM

## 2015-09-22 MED ORDER — UMECLIDINIUM-VILANTEROL 62.5-25 MCG/INH IN AEPB: 1.0000 | INHALATION_SPRAY | Freq: Every day | RESPIRATORY_TRACT | 2 refills | Status: DC

## 2015-09-22 NOTE — Progress Notes (Signed)
Subjective:    Patient ID: Jasmine Grant, female    DOB: 1955/05/01, 60 y.o.   MRN: [id number]  Cough  Associated symptoms include shortness of breath. Pertinent negatives include no chest pain, chills, ear pain, fever, headaches, postnasal drip, rash, rhinorrhea or sore throat.   60 year old former smoker (30 pk-yrs) with a diagnosis of stage IV adenocarcinoma of the lung made in November 2015. The presumed primary lesion was a large right upper lobe mass compared by metastatic disease to bone and pretracheal lymph nodes. Foundation 1 testing did not show a treatable mutation. She received radiation therapy to the mediastinum, bone. She underwent carboplatin and Taxol with good response until a PET scan 07/07/14 showed progression of disease. Palliative chemotherapy was initiated at that time, as well as further radiation therapy to bone in July 2016. She has experienced progressive cough going back to her initial XRT in February, productive of yellow phlegm. Has been associated with some progressive dyspnea. Could be limited by even walking through the house. She is able to push through the SOB without stopping.  Last treated with levofloxacin was last month > it did help her some. She denies any pleuritic pain but she does have some "rawness" in her chest especially with heavy coughing.    A PET scan was performed yesterday 10/05/14 that I have personally reviewed. This shows progression of disease including enlargement of hypermetabolic pulmonary nodules, some evidence for progressive pleural disease and some response to radiation therapy and a C4 bony lesion.   ROV 11/25/14 -- follow-up visit for cough and probable COPD superimposed on stage IV adenocarcinoma of the lung. She is currently completing chemotherapy with Dr. Alvy Bimler. At her last visit she started [facility] and she appears to have benefited in that cough is better and less SOB. She does still have some cough that she ascribes to some increase  in her allergy symptoms since last time. She is doing nasal saline spray, not washes.   ROV 09/22/15 -- This follow-up visit for stage IV adenocarcinoma and obstructive lung disease. She was seen in our office 08/15/15 with more dyspnea and purulent secretions. She was treated with Levaquin, prednisone and started on Breo plus albuterol as needed. A CT scan of the chest was in performed on 08/17/15 that showed some decrease in size of her mediastinal lymphadenopathy and scattered pulmonary nodules but with some increased consolidation in the right upper lobe and some lower lobe mucous plugging. Unfortunately more recently she experienced acute dyspnea over a 5 day period and underwent a CT-PA That identified a right lower lobe pulmonary embolism, Increase in size and evidence for invasion from her right apical lung mass. She was started on xarelto. Dyspnea may be a bit better. She is following with Dr Alvy Bimler, receiving chemo.    Review of Systems  Constitutional: Negative for chills, fever and unexpected weight change.  HENT: Negative for congestion, dental problem, ear pain, nosebleeds, postnasal drip, rhinorrhea, sinus pressure, sneezing, sore throat, trouble swallowing and voice change.   Eyes: Negative for visual disturbance.  Respiratory: Positive for cough and shortness of breath. Negative for choking.   Cardiovascular: Negative for chest pain and leg swelling.  Gastrointestinal: Negative for abdominal pain, diarrhea and vomiting.  Genitourinary: Negative for difficulty urinating.  Musculoskeletal: Negative for arthralgias.  Skin: Negative for rash.  Neurological: Negative for tremors, syncope and headaches.  Hematological: Does not bruise/bleed easily.    Past Medical History:  Diagnosis Date  . Arthritis 04/27/2014  .  Drug-induced hypokalemia 05/26/2015  . Dysuria 11/03/2014  . Essential hypertension 04/27/2014  . Headache disorder 06/08/2015  . Hx of thyroid cancer    10 years ago  .  Insomnia 12/29/2013  . Lung cancer ([place]) 12/14/13   Adenocarcinoma of right upper lobe mediastinal mass and bone   . 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 Panic attack   . S/P radiation therapy completed 12/16/13-02/05/14   RULL lung  . Thyroid ca [facility]) dx'd 20+yrs ago   surg only  . UTI (lower urinary tract infection) 05/26/2015  . UTI (urinary tract infection) 04/29/2015     Family History  Problem Relation Age of Onset  . Heart disease Mother   . Heart disease Father   . Thyroid cancer Father      Social History   Social History  . Marital status: Legally Separated    Spouse name: N/A  . Number of children: N/A  . Years of education: N/A   Occupational History  . Not on file.   Social History Main Topics  . Smoking status: Former Smoker    Packs/day: 1.00    Years: 30.00    Types: Cigarettes    Quit date: 01/29/2009  . Smokeless tobacco: Never Used  . Alcohol use 0.0 oz/week  . Drug use: No  . Sexual activity: Not on file   Other Topics Concern  . Not on file   Social History Narrative  . No narrative on file     Allergies  Allergen Reactions  . Codeine Nausea And Vomiting and Other (See Comments)    Any medication that is a codeine derivative like Hydrocodone, etc.  . Penicillins     Unknown reaction as a child  . Sulfa Antibiotics Nausea And Vomiting     Outpatient Medications Prior to Visit  Medication Sig Dispense Refill  . albuterol (PROVENTIL HFA;VENTOLIN HFA) 108 (90 BASE) MCG/ACT inhaler Inhale 2 puffs into the lungs every 4 (four) hours as needed for wheezing or shortness of breath. 1 Inhaler 5  . amLODipine (NORVASC) 10 MG tablet Take 1 tablet (10 mg total) by mouth at bedtime. (Patient taking differently: Take 10 mg by mouth daily as needed (high blood pressure). ) 90 tablet 2  . fluticasone (FLONASE) 50 MCG/ACT nasal spray Place 2 sprays into both nostrils daily. (Patient taking differently: Place 2 sprays into both nostrils daily as needed for  allergies. ) 16 g 5  . Fluticasone Furoate-Vilanterol (BREO ELLIPTA) 200-25 MCG/INH AEPB Inhale 1 puff into the lungs daily. Reported on 04/20/2015    . hydrochlorothiazide (HYDRODIURIL) 25 MG tablet TAKE 1 TABLET BY MOUTH EVERY DAY (Patient taking differently: TAKE 1 TABLET BY MOUTH EVERY DAY AS NEEDED FOR HIGH BLOOD PRESSURE) 30 tablet 1  . levothyroxine (SYNTHROID, LEVOTHROID) 200 MCG tablet TAKE 1 TABLET BY MOUTH DAILY BEFORE BREAKFAST. 90 tablet 0  . lisinopril (PRINIVIL) 10 MG tablet Take 1 tablet (10 mg total) by mouth at bedtime. (Patient taking differently: Take 10 mg by mouth at bedtime as needed (high blood pressure). ) 30 tablet 9  . LORazepam (ATIVAN) 0.5 MG tablet TAKE 1 TABLET BY MOUTH EVERY 8 HOURS AS NEEDED FOR ANXIETY OR FOR NAUSEA 90 tablet 0  . mirtazapine (REMERON) 15 MG tablet TAKE 1 TABLET (15 MG TOTAL) BY MOUTH AT BEDTIME AS NEEDED (FOR SLEEP.). (Patient taking differently: Take 15 mg by mouth at bedtime. ) 90 tablet 6  . ondansetron (ZOFRAN) 8 MG tablet TAKE 1 TABLET BY MOUTH EVERY 6  HOURS AS NEEDED FOR NAUSEA. 90 tablet 0  . predniSONE (DELTASONE) 20 MG tablet Take 1 tablet (20 mg total) by mouth daily with breakfast. 30 tablet 0  . prochlorperazine (COMPAZINE) 10 MG tablet TAKE 1 TABLET BY MOUTH EVERY 6 (SIX) HOURS AS NEEDED FOR NAUSEA OR VOMITING. 30 tablet 1  . ranitidine (ZANTAC) 150 MG tablet Take 1 tablet (150 mg total) by mouth every evening. 90 tablet 2  . Rivaroxaban 15 & 20 MG TBPK Take as directed on package: Start with one '15mg'$  tablet by mouth twice a day with food. On Day 22, switch to one '20mg'$  tablet once a day with food. 51 each 0   No facility-administered medications prior to visit.          Objective:   Physical Exam Vitals:   09/22/15 1401  BP: (!) 128/92  Pulse: 82  SpO2: 96%  Weight: 237 lb (107.5 kg)  Height: '6\' 2"'$  (1.88 m)   Gen: Pleasant, well-nourished, in no distress,  normal affect  ENT: No lesions,  mouth clear,  oropharynx clear, no  postnasal drip  Neck: No JVD, no TMG, no carotid bruits  Lungs: No use of accessory muscles, few inspiratory crackles at the right lateral base  Cardiovascular: RRR, heart sounds normal, no murmur or gallops, no peripheral edema  Musculoskeletal: No deformities, no cyanosis or clubbing  Neuro: alert, non focal  Skin: Warm, no lesions or rashes    PET scan 10/05/14 --  COMPARISON: 07/07/2014  FINDINGS: NECK  A peripherally calcified lesion in the left carotid space measures a S.U.V. max of 9.5. Increased from a S.U.V. max of 3.2 on the prior exam. This has been present on multiple priors.  CHEST  Thoracic adenopathy, with a mass in the subcarinal station, extending into the azygoesophageal recess. This measures 2.8 cm and a S.U.V. max of 12.9. On the prior exam, this measured 12.5 cm and a S.U.V. max of 9.8.  Hypermetabolic bilateral pulmonary nodules. Index right upper lobe nodule measures 11 mm and a S.U.V. max of 10.7. This is enlarged from 7 mm on the prior exam.  Trace right pleural thickening with hypermetabolism, new. This measures a S.U.V. max of 3.1 on image 74. A more nodular area of right-sided pleural-based irregularity corresponds to hypermetabolism, measuring a S.U.V. max of 6.3.  ABDOMEN/PELVIS  No extraosseous hypermetabolism within the abdomen.  SKELETON  Left-sided C4 hypermetabolism measures a S.U.V. max of 7.3. On the prior exam, this measures a S.U.V. max of 12.4.  Hypermetabolism about the spinous processes at the L1-2 level could be degenerative or malignant. This measures a S.U.V. max of 4.1, and is new.  CT IMAGES PERFORMED FOR ATTENUATION CORRECTION  Right paratracheal soft tissue mass/adenopathy. 2.3 cm on image 57 versus 2.1 cm on the prior. Index left lower lobe pulmonary nodule measures 1.7 cm on image 87 versus 1.4 cm on the prior. Low-density hepatic lesions are again identified, likely cysts. Normal  adrenal glands. Periampullary duodenal diverticulum. 8 mm right renal artery aneurysm.  IMPRESSION: 1. Progression of pulmonary and thoracic nodal metastasis. Minimal right pleural thickening and nodularity with hypermetabolism are new, suspicious for pleural metastasis. 2. Response to therapy of a left-sided C4 metastasis. New hypermetabolism in the lumbar spine could be degenerative or metastatic. 3. No extraosseous metastasis within the abdomen or pelvis.       Assessment & Plan:  Adenocarcinoma of upper lobe of right lung Appears to be responding somewhat to her current chemotherapy. Disease is still  quite significant especially at the right apex. She is at risk for postobstructive process, airway occlusion. She will follow with oncology  Pulmonary embolism ([place]) Right-sided pulmonary embolism currently on Xarelto. We will defer to Dr Alvy Bimler as to whether to change to enoxaparin.   COPD (chronic obstructive pulmonary disease) ([place]) Unclear how much of her dyspnea is related to COPD versus the pulmonary embolism and her known lung cancer. She is interested in treating it maximally and we will change Breo to Anoro to see if she derives more benefit  Baltazar Apo, MD, PhD 09/22/2015, 2:30 PM [facility] [phone] or if no answer [phone]

## 2015-09-22 NOTE — Assessment & Plan Note (Signed)
Unclear how much of her dyspnea is related to COPD versus the pulmonary embolism and her known lung cancer. She is interested in treating it maximally and we will change Breo to Anoro to see if she derives more benefit

## 2015-09-22 NOTE — Assessment & Plan Note (Signed)
Appears to be responding somewhat to her current chemotherapy. Disease is still quite significant especially at the right apex. She is at risk for postobstructive process, airway occlusion. She will follow with oncology

## 2015-09-22 NOTE — Assessment & Plan Note (Signed)
Right-sided pulmonary embolism currently on Xarelto. We will defer to Dr Alvy Bimler as to whether to change to enoxaparin.

## 2015-09-22 NOTE — Patient Instructions (Signed)
Stop breo temporarily Start Anoro once a day Take albuterol 2 puffs up to every 4 hours if needed for shortness of breath.  Continue Xarelto. Discuss the decision to continue this medications (versus an alternative) with Dr Alvy Bimler.  Follow with Dr Lamonte Sakai in 2 months or sooner if you have any problems.

## 2015-09-26 ENCOUNTER — Other Ambulatory Visit: Payer: Self-pay | Admitting: Hematology and Oncology

## 2015-09-28 ENCOUNTER — Ambulatory Visit (HOSPITAL_BASED_OUTPATIENT_CLINIC_OR_DEPARTMENT_OTHER): Payer: BLUE CROSS/BLUE SHIELD

## 2015-09-28 ENCOUNTER — Ambulatory Visit (HOSPITAL_BASED_OUTPATIENT_CLINIC_OR_DEPARTMENT_OTHER): Payer: BLUE CROSS/BLUE SHIELD | Admitting: Hematology and Oncology

## 2015-09-28 ENCOUNTER — Encounter: Payer: Self-pay | Admitting: Hematology and Oncology

## 2015-09-28 ENCOUNTER — Telehealth: Payer: Self-pay | Admitting: Hematology and Oncology

## 2015-09-28 ENCOUNTER — Other Ambulatory Visit (HOSPITAL_BASED_OUTPATIENT_CLINIC_OR_DEPARTMENT_OTHER): Payer: BLUE CROSS/BLUE SHIELD

## 2015-09-28 ENCOUNTER — Ambulatory Visit: Payer: BLUE CROSS/BLUE SHIELD

## 2015-09-28 DIAGNOSIS — D6481 Anemia due to antineoplastic chemotherapy: Secondary | ICD-10-CM

## 2015-09-28 DIAGNOSIS — G620 Drug-induced polyneuropathy: Secondary | ICD-10-CM | POA: Diagnosis not present

## 2015-09-28 DIAGNOSIS — I2699 Other pulmonary embolism without acute cor pulmonale: Secondary | ICD-10-CM

## 2015-09-28 DIAGNOSIS — T451X5A Adverse effect of antineoplastic and immunosuppressive drugs, initial encounter: Secondary | ICD-10-CM

## 2015-09-28 DIAGNOSIS — Z5111 Encounter for antineoplastic chemotherapy: Secondary | ICD-10-CM

## 2015-09-28 DIAGNOSIS — C3411 Malignant neoplasm of upper lobe, right bronchus or lung: Secondary | ICD-10-CM

## 2015-09-28 LAB — CBC WITH DIFFERENTIAL/PLATELET
BASO%: 0.2 % (ref 0.0–2.0)
BASOS ABS: 0 10*3/uL (ref 0.0–0.1)
EOS ABS: 0.1 10*3/uL (ref 0.0–0.5)
EOS%: 0.6 % (ref 0.0–7.0)
HEMATOCRIT: 35.9 % (ref 34.8–46.6)
HEMOGLOBIN: 11.1 g/dL — AB (ref 11.6–15.9)
LYMPH%: 10.1 % — AB (ref 14.0–49.7)
MCH: 31.2 pg (ref 25.1–34.0)
MCHC: 30.9 g/dL — AB (ref 31.5–36.0)
MCV: 101 fL (ref 79.5–101.0)
MONO#: 1.2 10*3/uL — AB (ref 0.1–0.9)
MONO%: 10.3 % (ref 0.0–14.0)
NEUT#: 9.5 10*3/uL — ABNORMAL HIGH (ref 1.5–6.5)
NEUT%: 78.8 % — AB (ref 38.4–76.8)
PLATELETS: 358 10*3/uL (ref 145–400)
RBC: 3.56 10*6/uL — ABNORMAL LOW (ref 3.70–5.45)
RDW: 19.6 % — ABNORMAL HIGH (ref 11.2–14.5)
WBC: 12 10*3/uL — AB (ref 3.9–10.3)
lymph#: 1.2 10*3/uL (ref 0.9–3.3)

## 2015-09-28 LAB — COMPREHENSIVE METABOLIC PANEL
ALBUMIN: 3.4 g/dL — AB (ref 3.5–5.0)
ALK PHOS: 85 U/L (ref 40–150)
ALT: 19 U/L (ref 0–55)
ANION GAP: 12 meq/L — AB (ref 3–11)
AST: 13 U/L (ref 5–34)
BILIRUBIN TOTAL: 0.31 mg/dL (ref 0.20–1.20)
BUN: 13.6 mg/dL (ref 7.0–26.0)
CALCIUM: 9.4 mg/dL (ref 8.4–10.4)
CO2: 26 mEq/L (ref 22–29)
CREATININE: 0.9 mg/dL (ref 0.6–1.1)
Chloride: 107 mEq/L (ref 98–109)
EGFR: 67 mL/min/{1.73_m2} — ABNORMAL LOW (ref >90)
Glucose: 110 mg/dl (ref 70–140)
Potassium: 3.5 mEq/L (ref 3.5–5.1)
Sodium: 145 mEq/L (ref 136–145)
Total Protein: 7.3 g/dL (ref 6.4–8.3)

## 2015-09-28 MED ORDER — PALONOSETRON HCL INJECTION 0.25 MG/5ML
0.2500 mg | Freq: Once | INTRAVENOUS | Status: AC
  Administered 2015-09-28: 0.25 mg via INTRAVENOUS

## 2015-09-28 MED ORDER — SODIUM CHLORIDE 0.9 % IV SOLN
INTRAVENOUS | Status: DC
  Administered 2015-09-28: 12:00:00 via INTRAVENOUS

## 2015-09-28 MED ORDER — VINORELBINE TARTRATE CHEMO INJECTION 50 MG/5ML
17.0000 mg/m2 | Freq: Once | INTRAVENOUS | Status: AC
  Administered 2015-09-28: 40 mg via INTRAVENOUS
  Filled 2015-09-28: qty 4

## 2015-09-28 MED ORDER — SODIUM CHLORIDE 0.9 % IJ SOLN
10.0000 mL | INTRAMUSCULAR | Status: DC | PRN
  Administered 2015-09-28: 10 mL via INTRAVENOUS
  Filled 2015-09-28: qty 10

## 2015-09-28 MED ORDER — SODIUM CHLORIDE 0.9% FLUSH
10.0000 mL | INTRAVENOUS | Status: DC | PRN
  Administered 2015-09-28: 10 mL
  Filled 2015-09-28: qty 10

## 2015-09-28 MED ORDER — SODIUM CHLORIDE 0.9 % IV SOLN
700.0000 mg/m2 | Freq: Once | INTRAVENOUS | Status: AC
  Administered 2015-09-28: 1672 mg via INTRAVENOUS
  Filled 2015-09-28: qty 43.97

## 2015-09-28 MED ORDER — HEPARIN SOD (PORK) LOCK FLUSH 100 UNIT/ML IV SOLN
500.0000 [IU] | Freq: Once | INTRAVENOUS | Status: AC | PRN
  Administered 2015-09-28: 500 [IU]
  Filled 2015-09-28: qty 5

## 2015-09-28 MED ORDER — SODIUM CHLORIDE 0.9 % IV SOLN
Freq: Once | INTRAVENOUS | Status: AC
  Administered 2015-09-28: 12:00:00 via INTRAVENOUS
  Filled 2015-09-28: qty 5

## 2015-09-28 MED ORDER — PALONOSETRON HCL INJECTION 0.25 MG/5ML
INTRAVENOUS | Status: AC
  Filled 2015-09-28: qty 5

## 2015-09-28 NOTE — Assessment & Plan Note (Signed)
she has mild peripheral neuropathy, likely related to side effects of treatment. It is only mild, not bothering the patient. I will observe for now If it gets worse in the future, I will consider modifying the dose of the treatment  

## 2015-09-28 NOTE — Assessment & Plan Note (Signed)
This is likely due to recent treatment. The patient denies recent history of bleeding such as epistaxis, hematuria or hematochezia. She is asymptomatic from the anemia. I will observe for now.  She does not require transfusion now. I will continue the chemotherapy at current dose without dosage adjustment.

## 2015-09-28 NOTE — Assessment & Plan Note (Signed)
She is recovering well from recent hospitalization. Due to her profound disability from recent acute PE, I have given her time off work to recover. I will resume her treatment today I will see her back in 3 weeks for further assessment. Her next CT scan would be due in November 2017

## 2015-09-28 NOTE — Assessment & Plan Note (Signed)
She is doing well without any signs of bleeding complication. She will continue anticoagulation therapy indefinitely.

## 2015-09-28 NOTE — Progress Notes (Signed)
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      12/13/2013 Imaging    CT scan show large mass in the right lung apex likely representing primary lung carcinoma or large metastasis. Metastases demonstrated in the pretracheal lymph nodes, T1 vertebral body and possibly sacrum.      12/14/2013 Initial Diagnosis    Adenocarcinoma of upper lobe of right lung      12/14/2013 Pathology Results    Accession: [id number] biopsy from right upper lobe showed adenocarcinoma. [facility] testing was positive for several mutations without any known treatment options now      12/17/2013 Imaging    MRI of the head is negative.      12/23/2013 - 02/05/2014 Radiation Therapy    She received radiation therapy to the mediastinal mass and bone      12/28/2013 Imaging    PET/CT scan showed mediastinal mass, lymphadenopathy and T1 involvement.      01/01/2014 - 01/29/2014 Chemotherapy    Weekly carboplatin and Taxol were added      03/12/2014 Imaging    Repeat PET scan showed near complete response to treatment.      03/16/2014 - 05/28/2014 Chemotherapy    She started treatment with Alimta for maintenance      07/07/2014 Imaging    PET scan showed disease progression      07/15/2014 - 09/23/2014 Chemotherapy    She received palliative treatment with Opdivo      08/13/2014 - 08/25/2014 Radiation Therapy    She completed  palliative radiation therapy to the bone.      10/05/2014 Imaging    PET CT showed disease progression except for site of recent radiation therapy      10/13/2014 - 04/06/2015 Chemotherapy    She received Rx with weekly carbo/taxol, 2 weeks on 1 week off with Avastin every 3 weeks      12/15/2014 Imaging    PET scan showed positive response to Rx      01/18/2015 Imaging    Ct chest showed disease improvement      04/19/2015 Imaging    Ct chest diease progression      04/27/2015 -  Chemotherapy    Treatment is switched to Gemzar and Navelbine      05/04/2015 Adverse Reaction    Treatment was missed & interrupted due to recurrent UTI      06/13/2015 Imaging    MRI head is negative      08/17/2015 Imaging    CT imaging showed positive response to Rx      09/15/2015 - 09/17/2015 Hospital Admission    She was admitted to the hospital due to diagnosis of acute pulmonary embolism      09/15/2015 Imaging    Ct angiogram showed acute PE       INTERVAL HISTORY: Please see below for problem oriented  charting. She returns to resume chemotherapy. She has mild reductive cough but improved. She denies further chest pain or shortness of breath. The patient denies any recent signs or symptoms of bleeding such as spontaneous epistaxis, hematuria or hematochezia. She complained of neck pain due to muscle spasm. She complained of mild weakness since recent discharge from the hospital and unable to return back to work. Peripheral neuropathy is about the same, not debilitating. She declined pain medicine due to prior side effects  REVIEW OF SYSTEMS:   Constitutional: Denies fevers, chills or abnormal weight loss Eyes: Denies blurriness of vision Ears, nose, mouth, throat, and face: Denies mucositis or sore throat Cardiovascular: Denies palpitation, chest discomfort or lower extremity swelling Gastrointestinal:  Denies nausea, heartburn or change in bowel habits Skin: Denies abnormal skin  rashes Lymphatics: Denies new lymphadenopathy or easy bruising Neurological:Denies numbness, tingling or new weaknesses Behavioral/Psych: Mood is stable, no new changes  All other systems were reviewed with the patient and are negative.  I have reviewed the past medical history, past surgical history, social history and family history with the patient and they are unchanged from previous note.  ALLERGIES:  is allergic to codeine; penicillins; and sulfa antibiotics.  MEDICATIONS:  Current Outpatient Prescriptions  Medication Sig Dispense Refill  . albuterol (PROVENTIL HFA;VENTOLIN HFA) 108 (90 BASE) MCG/ACT inhaler Inhale 2 puffs into the lungs every 4 (four) hours as needed for wheezing or shortness of breath. 1 Inhaler 5  . amLODipine (NORVASC) 10 MG tablet Take 1 tablet (10 mg total) by mouth at bedtime. (Patient taking differently: Take 10 mg by mouth daily as needed (high blood pressure). ) 90 tablet 2  . fluticasone (FLONASE) 50 MCG/ACT nasal spray Place 2 sprays into both nostrils daily. (Patient taking differently: Place 2 sprays into both nostrils daily as needed for allergies. ) 16 g 5  . hydrochlorothiazide (HYDRODIURIL) 25 MG tablet TAKE 1 TABLET BY MOUTH EVERY DAY (Patient taking differently: TAKE 1 TABLET BY MOUTH EVERY DAY AS NEEDED FOR HIGH BLOOD PRESSURE) 30 tablet 1  . levothyroxine (SYNTHROID, LEVOTHROID) 200 MCG tablet TAKE 1 TABLET BY MOUTH DAILY BEFORE BREAKFAST. 90 tablet 0  . lisinopril (PRINIVIL) 10 MG tablet Take 1 tablet (10 mg total) by mouth at bedtime. (Patient taking differently: Take 10 mg by mouth at bedtime as needed (high blood pressure). ) 30 tablet 9  . LORazepam (ATIVAN) 0.5 MG tablet TAKE 1 TABLET BY MOUTH EVERY 8 HOURS AS NEEDED FOR ANXIETY OR FOR NAUSEA 90 tablet 0  . mirtazapine (REMERON) 15 MG tablet TAKE 1 TABLET (15 MG TOTAL) BY MOUTH AT BEDTIME AS NEEDED (FOR SLEEP.). (Patient taking differently: Take 15 mg by mouth at bedtime. ) 90 tablet 6  .  ondansetron (ZOFRAN) 8 MG tablet TAKE 1 TABLET BY MOUTH EVERY 6 HOURS AS NEEDED FOR NAUSEA. 90 tablet 0  . predniSONE (DELTASONE) 20 MG tablet TAKE 1 TABLET BY MOUTH DAILY WITH BREAKFAST. 30 tablet 0  . prochlorperazine (COMPAZINE) 10 MG tablet TAKE 1 TABLET BY MOUTH EVERY 6 (SIX) HOURS AS NEEDED FOR NAUSEA OR VOMITING. 30 tablet 1  . ranitidine (ZANTAC) 150 MG tablet Take 1 tablet (150 mg total) by mouth every evening. 90 tablet 2  . Rivaroxaban 15 & 20 MG TBPK Take as directed on package: Start with one '15mg'$  tablet by mouth twice a day with food. On Day 22, switch to one '20mg'$  tablet once a day with food. 51 each 0  . umeclidinium-vilanterol (ANORO ELLIPTA) 62.5-25 MCG/INH  AEPB Inhale 1 puff into the lungs daily. 60 each 2   No current facility-administered medications for this visit.     PHYSICAL EXAMINATION: ECOG PERFORMANCE STATUS: 1 - Symptomatic but completely ambulatory  Vitals:   09/28/15 1047  BP: 139/74  Pulse: 80  Resp: 19  Temp: 98.2 F (36.8 C)   Filed Weights   09/28/15 1047  Weight: 235 lb 4.8 oz (106.7 kg)    GENERAL:alert, no distress and comfortable. She is mildly cushingoid SKIN: skin color, texture, turgor are normal, no rashes or significant lesions EYES: normal, Conjunctiva are pink and non-injected, sclera clear OROPHARYNX:no exudate, no erythema and lips, buccal mucosa, and tongue normal  NECK: supple, thyroid normal size, non-tender, without nodularity LYMPH:  no palpable lymphadenopathy in the cervical, axillary or inguinal LUNGS: clear to auscultation and percussion with normal breathing effort HEART: regular rate & rhythm and no murmurs with mild bilateral lower extremity edema ABDOMEN:abdomen soft, non-tender and normal bowel sounds Musculoskeletal:no cyanosis of digits and no clubbing  NEURO: alert & oriented x 3 with fluent speech, no focal motor/sensory deficits  LABORATORY DATA:  I have reviewed the data as listed    Component Value Date/Time    NA 145 09/28/2015 1019   K 3.5 09/28/2015 1019   CL 100 (L) 09/16/2015 0300   CO2 26 09/28/2015 1019   GLUCOSE 110 09/28/2015 1019   BUN 13.6 09/28/2015 1019   CREATININE 0.9 09/28/2015 1019   CALCIUM 9.4 09/28/2015 1019   PROT 7.3 09/28/2015 1019   ALBUMIN 3.4 (L) 09/28/2015 1019   AST 13 09/28/2015 1019   ALT 19 09/28/2015 1019   ALKPHOS 85 09/28/2015 1019   BILITOT 0.31 09/28/2015 1019   GFRNONAA >60 09/16/2015 0300   GFRAA >60 09/16/2015 0300    No results found for: SPEP, UPEP  Lab Results  Component Value Date   WBC 12.0 (H) 09/28/2015   NEUTROABS 9.5 (H) 09/28/2015   HGB 11.1 (L) 09/28/2015   HCT 35.9 09/28/2015   MCV 101.0 09/28/2015   PLT 358 09/28/2015      Chemistry      Component Value Date/Time   NA 145 09/28/2015 1019   K 3.5 09/28/2015 1019   CL 100 (L) 09/16/2015 0300   CO2 26 09/28/2015 1019   BUN 13.6 09/28/2015 1019   CREATININE 0.9 09/28/2015 1019      Component Value Date/Time   CALCIUM 9.4 09/28/2015 1019   ALKPHOS 85 09/28/2015 1019   AST 13 09/28/2015 1019   ALT 19 09/28/2015 1019   BILITOT 0.31 09/28/2015 1019      ASSESSMENT & PLAN:  Adenocarcinoma of upper lobe of right lung She is recovering well from recent hospitalization. Due to her profound disability from recent acute PE, I have given her time off work to recover. I will resume her treatment today I will see her back in 3 weeks for further assessment. Her next CT scan would be due in November 2017  Anemia due to antineoplastic chemotherapy This is likely due to recent treatment. The patient denies recent history of bleeding such as epistaxis, hematuria or hematochezia. She is asymptomatic from the anemia. I will observe for now.  She does not require transfusion now. I will continue the chemotherapy at current dose without dosage adjustment.   Neuropathy due to chemotherapeutic drug [facility]) she has mild peripheral neuropathy, likely related to side effects of  treatment. It is only mild, not bothering the patient. I will observe for now If  it gets worse in the future, I will consider modifying the dose of the treatment   Pulmonary embolism ([place]) She is doing well without any signs of bleeding complication. She will continue anticoagulation therapy indefinitely.   No orders of the defined types were placed in this encounter.  All questions were answered. The patient knows to call the clinic with any problems, questions or concerns. No barriers to learning was detected. I spent 25 minutes counseling the patient face to face. The total time spent in the appointment was 40 minutes and more than 50% was on counseling and review of test results     [facility], Lylee Corrow, MD 09/28/2015 11:21 AM

## 2015-09-28 NOTE — Telephone Encounter (Signed)
GAVE PATIENT AVS REPORT AND APPOINTMENTS FOR September.  °

## 2015-09-28 NOTE — Patient Instructions (Signed)
[facility] Discharge Instructions for Patients Receiving Chemotherapy  Today you received the following chemotherapy agents Navelbine/Gemzar To help prevent nausea and vomiting after your treatment, we encourage you to take your nausea medication as prescribed.   If you develop nausea and vomiting that is not controlled by your nausea medication, call the clinic.   BELOW ARE SYMPTOMS THAT SHOULD BE REPORTED IMMEDIATELY:  *FEVER GREATER THAN 100.5 F  *CHILLS WITH OR WITHOUT FEVER  NAUSEA AND VOMITING THAT IS NOT CONTROLLED WITH YOUR NAUSEA MEDICATION  *UNUSUAL SHORTNESS OF BREATH  *UNUSUAL BRUISING OR BLEEDING  TENDERNESS IN MOUTH AND THROAT WITH OR WITHOUT PRESENCE OF ULCERS  *URINARY PROBLEMS  *BOWEL PROBLEMS  UNUSUAL RASH Items with * indicate a potential emergency and should be followed up as soon as possible.  Feel free to call the clinic you have any questions or concerns. The clinic phone number is [phone].  Please show the [facility] at check-in to the Emergency Department and triage nurse.

## 2015-09-28 NOTE — Patient Instructions (Signed)

## 2015-10-04 ENCOUNTER — Other Ambulatory Visit: Payer: BLUE CROSS/BLUE SHIELD

## 2015-10-04 ENCOUNTER — Telehealth: Payer: Self-pay | Admitting: *Deleted

## 2015-10-04 ENCOUNTER — Other Ambulatory Visit: Payer: Self-pay | Admitting: Hematology and Oncology

## 2015-10-04 ENCOUNTER — Ambulatory Visit: Payer: BLUE CROSS/BLUE SHIELD | Admitting: Hematology and Oncology

## 2015-10-04 DIAGNOSIS — I2699 Other pulmonary embolism without acute cor pulmonale: Secondary | ICD-10-CM

## 2015-10-04 MED ORDER — RIVAROXABAN 20 MG PO TABS: 20.0000 mg | ORAL_TABLET | Freq: Every day | ORAL | 9 refills | Status: AC

## 2015-10-04 NOTE — Telephone Encounter (Signed)
Call from pt requesting Xarelto 20 mg dosage be sent to her pharmacy. She will complete the 15 mg starter pack next week. Message sent to Dr. Calton Dach RN for review/ refill.

## 2015-10-04 NOTE — Telephone Encounter (Signed)
Done. I sent it electronically

## 2015-10-05 ENCOUNTER — Ambulatory Visit: Payer: BLUE CROSS/BLUE SHIELD

## 2015-10-05 ENCOUNTER — Encounter: Payer: Self-pay | Admitting: Hematology and Oncology

## 2015-10-05 ENCOUNTER — Other Ambulatory Visit: Payer: Self-pay | Admitting: Hematology and Oncology

## 2015-10-05 ENCOUNTER — Other Ambulatory Visit: Payer: Self-pay

## 2015-10-05 ENCOUNTER — Ambulatory Visit (HOSPITAL_BASED_OUTPATIENT_CLINIC_OR_DEPARTMENT_OTHER): Payer: BLUE CROSS/BLUE SHIELD | Admitting: Hematology and Oncology

## 2015-10-05 ENCOUNTER — Ambulatory Visit (HOSPITAL_COMMUNITY)
Admission: RE | Admit: 2015-10-05 | Discharge: 2015-10-05 | Disposition: A | Discharge status: Home / Self Care | Payer: BLUE CROSS/BLUE SHIELD | Source: Ambulatory Visit | Attending: Hematology and Oncology | Admitting: Hematology and Oncology

## 2015-10-05 ENCOUNTER — Other Ambulatory Visit (HOSPITAL_BASED_OUTPATIENT_CLINIC_OR_DEPARTMENT_OTHER): Payer: BLUE CROSS/BLUE SHIELD

## 2015-10-05 ENCOUNTER — Ambulatory Visit (HOSPITAL_BASED_OUTPATIENT_CLINIC_OR_DEPARTMENT_OTHER): Payer: BLUE CROSS/BLUE SHIELD

## 2015-10-05 VITALS — BP 146/77 | HR 76 | Resp 18

## 2015-10-05 VITALS — BP 142/86 | HR 78 | Temp 98.7°F

## 2015-10-05 DIAGNOSIS — C3411 Malignant neoplasm of upper lobe, right bronchus or lung: Secondary | ICD-10-CM

## 2015-10-05 DIAGNOSIS — R42 Dizziness and giddiness: Secondary | ICD-10-CM | POA: Diagnosis not present

## 2015-10-05 DIAGNOSIS — Z5111 Encounter for antineoplastic chemotherapy: Secondary | ICD-10-CM | POA: Diagnosis not present

## 2015-10-05 DIAGNOSIS — I2699 Other pulmonary embolism without acute cor pulmonale: Secondary | ICD-10-CM

## 2015-10-05 DIAGNOSIS — J449 Chronic obstructive pulmonary disease, unspecified: Secondary | ICD-10-CM | POA: Diagnosis not present

## 2015-10-05 DIAGNOSIS — R0789 Other chest pain: Secondary | ICD-10-CM | POA: Insufficient documentation

## 2015-10-05 DIAGNOSIS — T451X5A Adverse effect of antineoplastic and immunosuppressive drugs, initial encounter: Secondary | ICD-10-CM

## 2015-10-05 DIAGNOSIS — E46 Unspecified protein-calorie malnutrition: Secondary | ICD-10-CM

## 2015-10-05 DIAGNOSIS — D6481 Anemia due to antineoplastic chemotherapy: Secondary | ICD-10-CM

## 2015-10-05 LAB — CBC WITH DIFFERENTIAL/PLATELET
BASO%: 0.2 % (ref 0.0–2.0)
Basophils Absolute: 0 10*3/uL (ref 0.0–0.1)
EOS%: 0.4 % (ref 0.0–7.0)
Eosinophils Absolute: 0 10*3/uL (ref 0.0–0.5)
HEMATOCRIT: 34.2 % — AB (ref 34.8–46.6)
HEMOGLOBIN: 10.5 g/dL — AB (ref 11.6–15.9)
LYMPH#: 1 10*3/uL (ref 0.9–3.3)
LYMPH%: 12.9 % — ABNORMAL LOW (ref 14.0–49.7)
MCH: 31.3 pg (ref 25.1–34.0)
MCHC: 30.7 g/dL — ABNORMAL LOW (ref 31.5–36.0)
MCV: 102.1 fL — ABNORMAL HIGH (ref 79.5–101.0)
MONO#: 0.5 10*3/uL (ref 0.1–0.9)
MONO%: 6.4 % (ref 0.0–14.0)
NEUT%: 80.1 % — AB (ref 38.4–76.8)
NEUTROS ABS: 6.5 10*3/uL (ref 1.5–6.5)
NRBC: 1 % — AB (ref 0–0)
Platelets: 144 10*3/uL — ABNORMAL LOW (ref 145–400)
RBC: 3.35 10*6/uL — ABNORMAL LOW (ref 3.70–5.45)
RDW: 17.3 % — ABNORMAL HIGH (ref 11.2–14.5)
WBC: 8.1 10*3/uL (ref 3.9–10.3)

## 2015-10-05 LAB — COMPREHENSIVE METABOLIC PANEL
ALBUMIN: 3.2 g/dL — AB (ref 3.5–5.0)
ALK PHOS: 76 U/L (ref 40–150)
ALT: 26 U/L (ref 0–55)
AST: 17 U/L (ref 5–34)
Anion Gap: 11 mEq/L (ref 3–11)
BUN: 19.2 mg/dL (ref 7.0–26.0)
CALCIUM: 9.1 mg/dL (ref 8.4–10.4)
CO2: 28 mEq/L (ref 22–29)
CREATININE: 0.9 mg/dL (ref 0.6–1.1)
Chloride: 105 mEq/L (ref 98–109)
EGFR: 70 mL/min/{1.73_m2} — ABNORMAL LOW (ref >90)
Glucose: 102 mg/dl (ref 70–140)
Potassium: 3.7 mEq/L (ref 3.5–5.1)
Sodium: 145 mEq/L (ref 136–145)
TOTAL PROTEIN: 6.9 g/dL (ref 6.4–8.3)
Total Bilirubin: 0.34 mg/dL (ref 0.20–1.20)

## 2015-10-05 MED ORDER — VINORELBINE TARTRATE CHEMO INJECTION 50 MG/5ML
17.0000 mg/m2 | Freq: Once | INTRAVENOUS | Status: AC
  Administered 2015-10-05: 40 mg via INTRAVENOUS
  Filled 2015-10-05: qty 1

## 2015-10-05 MED ORDER — FOSAPREPITANT DIMEGLUMINE INJECTION 150 MG
Freq: Once | INTRAVENOUS | Status: AC
  Administered 2015-10-05: 12:00:00 via INTRAVENOUS
  Filled 2015-10-05: qty 5

## 2015-10-05 MED ORDER — SODIUM CHLORIDE 0.9 % IV SOLN
700.0000 mg/m2 | Freq: Once | INTRAVENOUS | Status: AC
  Administered 2015-10-05: 1672 mg via INTRAVENOUS
  Filled 2015-10-05: qty 43.97

## 2015-10-05 MED ORDER — PALONOSETRON HCL INJECTION 0.25 MG/5ML
0.2500 mg | Freq: Once | INTRAVENOUS | Status: AC
  Administered 2015-10-05: 0.25 mg via INTRAVENOUS

## 2015-10-05 MED ORDER — SODIUM CHLORIDE 0.9 % IV SOLN
Freq: Once | INTRAVENOUS | Status: AC
  Administered 2015-10-05: 12:00:00 via INTRAVENOUS

## 2015-10-05 MED ORDER — VINORELBINE TARTRATE CHEMO INJECTION 50 MG/5ML: 17.5000 mg/m2 | Freq: Once | INTRAVENOUS | Status: DC

## 2015-10-05 MED ORDER — SODIUM CHLORIDE 0.9% FLUSH
10.0000 mL | INTRAVENOUS | Status: DC | PRN
Start: 2015-10-05 — End: 2015-10-05
  Filled 2015-10-05: qty 10

## 2015-10-05 MED ORDER — PALONOSETRON HCL INJECTION 0.25 MG/5ML
INTRAVENOUS | Status: AC
  Filled 2015-10-05: qty 5

## 2015-10-05 MED ORDER — ALTEPLASE 2 MG IJ SOLR
2.0000 mg | Freq: Once | INTRAMUSCULAR | Status: DC | PRN
  Filled 2015-10-05: qty 2

## 2015-10-05 MED ORDER — SODIUM CHLORIDE 0.9 % IJ SOLN
10.0000 mL | INTRAMUSCULAR | Status: DC | PRN
  Administered 2015-10-05: 10 mL via INTRAVENOUS
  Filled 2015-10-05: qty 10

## 2015-10-05 MED ORDER — HEPARIN SOD (PORK) LOCK FLUSH 100 UNIT/ML IV SOLN
500.0000 [IU] | Freq: Once | INTRAVENOUS | Status: DC | PRN
  Filled 2015-10-05: qty 5

## 2015-10-05 MED ORDER — SODIUM CHLORIDE 0.9 % IV SOLN
INTRAVENOUS | Status: DC
  Administered 2015-10-05: 12:00:00 via INTRAVENOUS

## 2015-10-05 MED ORDER — HEPARIN SOD (PORK) LOCK FLUSH 100 UNIT/ML IV SOLN
500.0000 [IU] | Freq: Once | INTRAVENOUS | Status: AC | PRN
  Administered 2015-10-05: 500 [IU] via INTRAVENOUS
  Filled 2015-10-05: qty 5

## 2015-10-05 NOTE — Progress Notes (Signed)
Pt was accessed by desk nurse in exam room

## 2015-10-05 NOTE — Assessment & Plan Note (Signed)
She has mild protein calorie malnutrition related to recent hospitalization. She will continue to increase oral intake as tolerated

## 2015-10-05 NOTE — Assessment & Plan Note (Signed)
She is recovering well from recent hospitalization. I will see her back in 2 weeks for further assessment. Her next CT scan would be due in November 2017 We will proceed with treatment today

## 2015-10-05 NOTE — Assessment & Plan Note (Signed)
She complained of atypical chest pain with sensation of dizziness. Chest x-ray is negative. EKG is negative. I suspect they could be related to anxiety and recent PE. I reassured the patient and we will proceed with treatment as scheduled today.

## 2015-10-05 NOTE — Assessment & Plan Note (Signed)
She is doing well without any signs of bleeding complication. She will continue anticoagulation therapy indefinitely. This is likely the reason for her atypical chest pain today.

## 2015-10-05 NOTE — Assessment & Plan Note (Signed)
This is likely due to recent treatment. The patient denies recent history of bleeding such as epistaxis, hematuria or hematochezia. She is asymptomatic from the anemia. I will observe for now.  She does not require transfusion now. I will continue the chemotherapy at current dose without dosage adjustment.

## 2015-10-05 NOTE — Progress Notes (Signed)
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      12/13/2013 Imaging    CT scan show large mass in the right lung apex likely representing primary lung carcinoma or large metastasis. Metastases demonstrated in the pretracheal lymph nodes, T1 vertebral body and possibly sacrum.      12/14/2013 Initial Diagnosis    Adenocarcinoma of upper lobe of right lung      12/14/2013 Pathology Results    Accession: [id number] biopsy from right upper lobe showed adenocarcinoma. [facility] testing was positive for several mutations without any known treatment options now      12/17/2013 Imaging    MRI of the head is negative.      12/23/2013 - 02/05/2014 Radiation Therapy    She received radiation therapy to the mediastinal mass and bone      12/28/2013 Imaging    PET/CT scan showed mediastinal mass, lymphadenopathy and T1 involvement.      01/01/2014 - 01/29/2014 Chemotherapy    Weekly carboplatin and Taxol were added      03/12/2014 Imaging    Repeat PET scan showed near complete response to treatment.      03/16/2014 - 05/28/2014 Chemotherapy    She started treatment with Alimta for maintenance      07/07/2014 Imaging    PET scan showed disease progression      07/15/2014 - 09/23/2014 Chemotherapy    She received palliative treatment with Opdivo      08/13/2014 - 08/25/2014 Radiation Therapy    She completed  palliative radiation therapy to the bone.      10/05/2014 Imaging    PET CT showed disease progression except for site of recent radiation therapy      10/13/2014 - 04/06/2015 Chemotherapy    She received Rx with weekly carbo/taxol, 2 weeks on 1 week off with Avastin every 3 weeks      12/15/2014 Imaging    PET scan showed positive response to Rx      01/18/2015 Imaging    Ct chest showed disease improvement      04/19/2015 Imaging    Ct chest diease progression      04/27/2015 -  Chemotherapy    Treatment is switched to Gemzar and Navelbine      05/04/2015 Adverse Reaction    Treatment was missed & interrupted due to recurrent UTI      06/13/2015 Imaging    MRI head is negative      08/17/2015 Imaging    CT imaging showed positive response to Rx      09/15/2015 - 09/17/2015 Hospital Admission    She was admitted to the hospital due to diagnosis of acute pulmonary embolism      09/15/2015 Imaging    Ct angiogram showed acute PE       INTERVAL HISTORY: Please see below for problem oriented  charting. She is seen urgently today prior to chemotherapy because of complaints of dizziness and atypical chest pain. She also complained of shortness of breath. Her symptoms have no aggravating factor or relieving factors. She continues to mild nonproductive cough. Denies hemoptysis. The patient denies any recent signs or symptoms of bleeding such as spontaneous epistaxis, hematuria or hematochezia. She has been taking diuretic therapy recently for leg swelling. She denies worsening neuropathy. No new neurological deficits  REVIEW OF SYSTEMS:   Constitutional: Denies fevers, chills or abnormal weight loss Eyes: Denies blurriness of vision Ears, nose, mouth, throat, and face: Denies mucositis or sore throat Cardiovascular: Denies palpitation, chest discomfort or lower extremity swelling Gastrointestinal:  Denies nausea, heartburn or change in bowel habits Skin: Denies abnormal  skin rashes Lymphatics: Denies new lymphadenopathy or easy bruising Behavioral/Psych: Mood is stable, no new changes  All other systems were reviewed with the patient and are negative.  I have reviewed the past medical history, past surgical history, social history and family history with the patient and they are unchanged from previous note.  ALLERGIES:  is allergic to codeine; penicillins; and sulfa antibiotics.  MEDICATIONS:  Current Outpatient Prescriptions  Medication Sig Dispense Refill  . albuterol (PROVENTIL HFA;VENTOLIN HFA) 108 (90 BASE) MCG/ACT inhaler Inhale 2 puffs into the lungs every 4 (four) hours as needed for wheezing or shortness of breath. 1 Inhaler 5  . amLODipine (NORVASC) 10 MG tablet Take 1 tablet (10 mg total) by mouth at bedtime. (Patient taking differently: Take 10 mg by mouth daily as needed (high blood pressure). ) 90 tablet 2  . fluticasone (FLONASE) 50 MCG/ACT nasal spray Place 2 sprays into both nostrils daily. (Patient taking differently: Place 2 sprays into both nostrils daily as needed for allergies. ) 16 g 5  . hydrochlorothiazide (HYDRODIURIL) 25 MG tablet TAKE 1 TABLET BY MOUTH EVERY DAY (Patient taking differently: TAKE 1 TABLET BY MOUTH EVERY DAY AS NEEDED FOR HIGH BLOOD PRESSURE) 30 tablet 1  . levothyroxine (SYNTHROID, LEVOTHROID) 200 MCG tablet TAKE 1 TABLET BY MOUTH DAILY BEFORE BREAKFAST. 90 tablet 0  . lisinopril (PRINIVIL) 10 MG tablet Take 1 tablet (10 mg total) by mouth at bedtime. (Patient taking differently: Take 10 mg by mouth at bedtime as needed (high blood pressure). ) 30 tablet 9  . LORazepam (ATIVAN) 0.5 MG tablet TAKE 1 TABLET BY MOUTH EVERY 8 HOURS AS NEEDED FOR ANXIETY OR FOR NAUSEA 90 tablet 0  . mirtazapine (REMERON) 15 MG tablet TAKE 1 TABLET (15 MG TOTAL) BY MOUTH AT BEDTIME AS NEEDED (FOR SLEEP.). (Patient taking differently: Take 15 mg by mouth at bedtime. ) 90 tablet 6  . ondansetron (ZOFRAN) 8 MG tablet TAKE 1 TABLET BY MOUTH  EVERY 6 HOURS AS NEEDED FOR NAUSEA. 90 tablet 0  . predniSONE (DELTASONE) 20 MG tablet TAKE 1 TABLET BY MOUTH DAILY WITH BREAKFAST. 30 tablet 0  . prochlorperazine (COMPAZINE) 10 MG tablet TAKE 1 TABLET BY MOUTH EVERY 6 (SIX) HOURS AS NEEDED FOR NAUSEA OR VOMITING. 30 tablet 1  . ranitidine (ZANTAC) 150 MG tablet Take 1 tablet (150 mg total) by mouth every evening. 90 tablet 2  . rivaroxaban (XARELTO) 20 MG TABS tablet Take 1 tablet (20 mg total) by mouth daily with supper. 30 tablet 9  . umeclidinium-vilanterol (ANORO ELLIPTA) 62.5-25 MCG/INH AEPB Inhale 1 puff into the lungs daily. 60 each 2   No current facility-administered medications for this visit.    Facility-Administered Medications Ordered in Other Visits  Medication  Dose Route Frequency Provider Last Rate Last Dose  . 0.9 %  sodium chloride infusion   Intravenous Continuous [name], MD   Stopped at 10/05/15 1401  . alteplase (CATHFLO ACTIVASE) injection 2 mg  2 mg Intracatheter Once PRN [name], MD      . heparin lock flush 100 unit/mL  500 Units Intracatheter Once PRN [name], MD      . sodium chloride 0.9 % injection 10 mL  10 mL Intravenous PRN [name], MD   10 mL at 10/05/15 1401  . sodium chloride flush (NS) 0.9 % injection 10 mL  10 mL Intracatheter PRN [name], MD        PHYSICAL EXAMINATION: ECOG PERFORMANCE STATUS: 2 - Symptomatic, <50% confined to bed  Vitals:   10/05/15 1028  BP: (!) 142/86  Pulse: 78  Temp: 98.7 F (37.1 C)   There were no vitals filed for this visit.  GENERAL:alert, no distress and comfortable. She looks mildly cushingoid SKIN: skin color, texture, turgor are normal, no rashes or significant lesions EYES: normal, Conjunctiva are pink and non-injected, sclera clear OROPHARYNX:no exudate, no erythema and lips, buccal mucosa, and tongue normal  NECK: supple, thyroid normal size, non-tender, without nodularity LYMPH:  no palpable lymphadenopathy in the cervical, axillary or  inguinal LUNGS: clear to auscultation and percussion with normal breathing effort HEART: regular rate & rhythm and no murmurs with mild bilateral lower extremity edema ABDOMEN:abdomen soft, non-tender and normal bowel sounds Musculoskeletal:no cyanosis of digits and no clubbing  NEURO: alert & oriented x 3 with fluent speech, no focal motor/sensory deficits  LABORATORY DATA:  I have reviewed the data as listed    Component Value Date/Time   NA 145 10/05/2015 1021   K 3.7 10/05/2015 1021   CL 100 (L) 09/16/2015 0300   CO2 28 10/05/2015 1021   GLUCOSE 102 10/05/2015 1021   BUN 19.2 10/05/2015 1021   CREATININE 0.9 10/05/2015 1021   CALCIUM 9.1 10/05/2015 1021   PROT 6.9 10/05/2015 1021   ALBUMIN 3.2 (L) 10/05/2015 1021   AST 17 10/05/2015 1021   ALT 26 10/05/2015 1021   ALKPHOS 76 10/05/2015 1021   BILITOT 0.34 10/05/2015 1021   GFRNONAA >60 09/16/2015 0300   GFRAA >60 09/16/2015 0300    No results found for: SPEP, UPEP  Lab Results  Component Value Date   WBC 8.1 10/05/2015   NEUTROABS 6.5 10/05/2015   HGB 10.5 (L) 10/05/2015   HCT 34.2 (L) 10/05/2015   MCV 102.1 (H) 10/05/2015   PLT 144 (L) 10/05/2015      Chemistry      Component Value Date/Time   NA 145 10/05/2015 1021   K 3.7 10/05/2015 1021   CL 100 (L) 09/16/2015 0300   CO2 28 10/05/2015 1021   BUN 19.2 10/05/2015 1021   CREATININE 0.9 10/05/2015 1021      Component Value Date/Time   CALCIUM 9.1 10/05/2015 1021   ALKPHOS 76 10/05/2015 1021   AST 17 10/05/2015 1021   ALT 26 10/05/2015 1021   BILITOT 0.34 10/05/2015 1021       RADIOGRAPHIC STUDIES: I have personally reviewed the radiological images as listed and agreed with the findings in the report. Dg Chest 2 View  Result Date: 10/05/2015 CLINICAL DATA:  Cough.  Generalized chest pain.  Lung cancer.  COPD. EXAM: CHEST  2 VIEW COMPARISON:  09/15/2015 FINDINGS: Lateral view degraded by patient arm position. A right-sided Port-A-Cath terminates at  the cavoatrial  junction or high right atrium. Midline trachea. Normal heart size. Similar pleural-parenchymal opacity at the medial right upper lobe and right apex. No pleural effusion or pneumothorax. Left base scarring. No lobar consolidation. IMPRESSION: Grossly similar right apical lung mass. No acute superimposed process. Electronically Signed   By: Abigail Miyamoto M.D.   On: 10/05/2015 11:07     ASSESSMENT & PLAN:  Adenocarcinoma of upper lobe of right lung She is recovering well from recent hospitalization. I will see her back in 2 weeks for further assessment. Her next CT scan would be due in November 2017 We will proceed with treatment today  Atypical chest pain She complained of atypical chest pain with sensation of dizziness. Chest x-ray is negative. EKG is negative. I suspect they could be related to anxiety and recent PE. I reassured the patient and we will proceed with treatment as scheduled today.  Dizziness She complained of mild dizziness. Evaluation for cardiac disease is negative. I recommend we proceed with treatment and I will give her additional hydration therapy with normal saline and to hold off taking her diuretics  Anemia due to antineoplastic chemotherapy This is likely due to recent treatment. The patient denies recent history of bleeding such as epistaxis, hematuria or hematochezia. She is asymptomatic from the anemia. I will observe for now.  She does not require transfusion now. I will continue the chemotherapy at current dose without dosage adjustment.   Protein calorie malnutrition ([place]) She has mild protein calorie malnutrition related to recent hospitalization. She will continue to increase oral intake as tolerated  Pulmonary embolism ([place]) She is doing well without any signs of bleeding complication. She will continue anticoagulation therapy indefinitely. This is likely the reason for her atypical chest pain today.   Orders Placed This Encounter   Procedures  . DG Chest 2 View    Standing Status:   Future    Number of Occurrences:   1    Standing Expiration Date:   11/08/2016    Order Specific Question:   Reason for exam:    Answer:   cough, chest pain, lung ca    Order Specific Question:   Preferred imaging location?    Answer:   [facility]   All questions were answered. The patient knows to call the clinic with any problems, questions or concerns. No barriers to learning was detected. I spent 30 minutes counseling the patient face to face. The total time spent in the appointment was 40 minutes and more than 50% was on counseling and review of test results     [facility], Sophronia Varney, MD 10/05/2015 4:13 PM

## 2015-10-05 NOTE — Patient Instructions (Signed)
[facility] Discharge Instructions for Patients Receiving Chemotherapy  Today you received the following chemotherapy agents: Navelbine and Gemzar. To help prevent nausea and vomiting after your treatment, we encourage you to take your nausea medication as prescribed.   If you develop nausea and vomiting that is not controlled by your nausea medication, call the clinic.   BELOW ARE SYMPTOMS THAT SHOULD BE REPORTED IMMEDIATELY:  *FEVER GREATER THAN 100.5 F  *CHILLS WITH OR WITHOUT FEVER  NAUSEA AND VOMITING THAT IS NOT CONTROLLED WITH YOUR NAUSEA MEDICATION  *UNUSUAL SHORTNESS OF BREATH  *UNUSUAL BRUISING OR BLEEDING  TENDERNESS IN MOUTH AND THROAT WITH OR WITHOUT PRESENCE OF ULCERS  *URINARY PROBLEMS  *BOWEL PROBLEMS  UNUSUAL RASH Items with * indicate a potential emergency and should be followed up as soon as possible.  Feel free to call the clinic you have any questions or concerns. The clinic phone number is [phone].  Please show the [facility] at check-in to the Emergency Department and triage nurse.

## 2015-10-05 NOTE — Assessment & Plan Note (Addendum)
She complained of mild dizziness. Evaluation for cardiac disease is negative. I recommend we proceed with treatment and I will give her additional hydration therapy with normal saline and to hold off taking her diuretics

## 2015-10-06 ENCOUNTER — Ambulatory Visit (HOSPITAL_BASED_OUTPATIENT_CLINIC_OR_DEPARTMENT_OTHER): Payer: BLUE CROSS/BLUE SHIELD

## 2015-10-06 VITALS — BP 160/84 | HR 90 | Temp 98.3°F | Resp 20

## 2015-10-06 DIAGNOSIS — C3411 Malignant neoplasm of upper lobe, right bronchus or lung: Secondary | ICD-10-CM | POA: Diagnosis not present

## 2015-10-06 MED ORDER — PEGFILGRASTIM INJECTION 6 MG/0.6ML
6.0000 mg | Freq: Once | SUBCUTANEOUS | Status: AC
  Administered 2015-10-06: 6 mg via SUBCUTANEOUS
  Filled 2015-10-06: qty 0.6

## 2015-10-06 NOTE — Patient Instructions (Signed)
Pegfilgrastim injection What is this medicine? PEGFILGRASTIM (PEG fil gra stim) is a long-acting granulocyte colony-stimulating factor that stimulates the growth of neutrophils, a type of white blood cell important in the body's fight against infection. It is used to reduce the incidence of fever and infection in patients with certain types of cancer who are receiving chemotherapy that affects the bone marrow, and to increase survival after being exposed to high doses of radiation. This medicine may be used for other purposes; ask your health care provider or pharmacist if you have questions. What should I tell my health care provider before I take this medicine? They need to know if you have any of these conditions: -kidney disease -latex allergy -ongoing radiation therapy -sickle cell disease -skin reactions to acrylic adhesives (On-Body Injector only) -an unusual or allergic reaction to pegfilgrastim, filgrastim, other medicines, foods, dyes, or preservatives -pregnant or trying to get pregnant -breast-feeding How should I use this medicine? This medicine is for injection under the skin. If you get this medicine at home, you will be taught how to prepare and give the pre-filled syringe or how to use the On-body Injector. Refer to the patient Instructions for Use for detailed instructions. Use exactly as directed. Take your medicine at regular intervals. Do not take your medicine more often than directed. It is important that you put your used needles and syringes in a special sharps container. Do not put them in a trash can. If you do not have a sharps container, call your pharmacist or healthcare provider to get one. Talk to your pediatrician regarding the use of this medicine in children. While this drug may be prescribed for selected conditions, precautions do apply. Overdosage: If you think you have taken too much of this medicine contact a poison control center or emergency room at  once. NOTE: This medicine is only for you. Do not share this medicine with others. What if I miss a dose? It is important not to miss your dose. Call your doctor or health care professional if you miss your dose. If you miss a dose due to an On-body Injector failure or leakage, a new dose should be administered as soon as possible using a single prefilled syringe for manual use. What may interact with this medicine? Interactions have not been studied. Give your health care provider a list of all the medicines, herbs, non-prescription drugs, or dietary supplements you use. Also tell them if you smoke, drink alcohol, or use illegal drugs. Some items may interact with your medicine. This list may not describe all possible interactions. Give your health care provider a list of all the medicines, herbs, non-prescription drugs, or dietary supplements you use. Also tell them if you smoke, drink alcohol, or use illegal drugs. Some items may interact with your medicine. What should I watch for while using this medicine? You may need blood work done while you are taking this medicine. If you are going to need a MRI, CT scan, or other procedure, tell your doctor that you are using this medicine (On-Body Injector only). What side effects may I notice from receiving this medicine? Side effects that you should report to your doctor or health care professional as soon as possible: -allergic reactions like skin rash, itching or hives, swelling of the face, lips, or tongue -dizziness -fever -pain, redness, or irritation at site where injected -pinpoint red spots on the skin -red or dark-brown urine -shortness of breath or breathing problems -stomach or side pain, or pain   at the shoulder -swelling -tiredness -trouble passing urine or change in the amount of urine Side effects that usually do not require medical attention (report to your doctor or health care professional if they continue or are  bothersome): -bone pain -muscle pain This list may not describe all possible side effects. Call your doctor for medical advice about side effects. You may report side effects to FDA at 1-800-FDA-1088. Where should I keep my medicine? Keep out of the reach of children. Store pre-filled syringes in a refrigerator between 2 and 8 degrees C (36 and 46 degrees F). Do not freeze. Keep in carton to protect from light. Throw away this medicine if it is left out of the refrigerator for more than 48 hours. Throw away any unused medicine after the expiration date. NOTE: This sheet is a summary. It may not cover all possible information. If you have questions about this medicine, talk to your doctor, pharmacist, or health care provider.    2016, Elsevier/Gold Standard. (2014-02-04 14:30:14)  

## 2015-10-12 ENCOUNTER — Emergency Department (HOSPITAL_COMMUNITY)
Admission: EM | Admit: 2015-10-12 | Discharge: 2015-10-12 | Disposition: A | Discharge status: Home / Self Care | Payer: BLUE CROSS/BLUE SHIELD | Attending: Emergency Medicine | Admitting: Emergency Medicine

## 2015-10-12 ENCOUNTER — Emergency Department (HOSPITAL_COMMUNITY): Payer: BLUE CROSS/BLUE SHIELD

## 2015-10-12 ENCOUNTER — Encounter (HOSPITAL_COMMUNITY): Payer: Self-pay

## 2015-10-12 ENCOUNTER — Other Ambulatory Visit: Payer: Self-pay

## 2015-10-12 DIAGNOSIS — Z7952 Long term (current) use of systemic steroids: Secondary | ICD-10-CM | POA: Insufficient documentation

## 2015-10-12 DIAGNOSIS — Z79899 Other long term (current) drug therapy: Secondary | ICD-10-CM | POA: Diagnosis not present

## 2015-10-12 DIAGNOSIS — Z85118 Personal history of other malignant neoplasm of bronchus and lung: Secondary | ICD-10-CM | POA: Diagnosis not present

## 2015-10-12 DIAGNOSIS — D093 Carcinoma in situ of thyroid and other endocrine glands: Secondary | ICD-10-CM | POA: Diagnosis not present

## 2015-10-12 DIAGNOSIS — R51 Headache: Secondary | ICD-10-CM | POA: Diagnosis not present

## 2015-10-12 DIAGNOSIS — J441 Chronic obstructive pulmonary disease with (acute) exacerbation: Secondary | ICD-10-CM | POA: Diagnosis not present

## 2015-10-12 DIAGNOSIS — R609 Edema, unspecified: Secondary | ICD-10-CM | POA: Diagnosis not present

## 2015-10-12 DIAGNOSIS — I10 Essential (primary) hypertension: Secondary | ICD-10-CM | POA: Diagnosis not present

## 2015-10-12 DIAGNOSIS — E876 Hypokalemia: Secondary | ICD-10-CM

## 2015-10-12 DIAGNOSIS — Z87891 Personal history of nicotine dependence: Secondary | ICD-10-CM | POA: Diagnosis not present

## 2015-10-12 DIAGNOSIS — R0602 Shortness of breath: Secondary | ICD-10-CM | POA: Diagnosis present

## 2015-10-12 LAB — CBC WITH DIFFERENTIAL/PLATELET
BASOS PCT: 0 %
Basophils Absolute: 0 10*3/uL (ref 0.0–0.1)
Eosinophils Absolute: 0.1 10*3/uL (ref 0.0–0.7)
Eosinophils Relative: 1 %
HCT: 31.8 % — ABNORMAL LOW (ref 36.0–46.0)
HEMOGLOBIN: 9.7 g/dL — AB (ref 12.0–15.0)
LYMPHS PCT: 8 %
Lymphs Abs: 1 10*3/uL (ref 0.7–4.0)
MCH: 30.7 pg (ref 26.0–34.0)
MCHC: 30.5 g/dL (ref 30.0–36.0)
MCV: 100.6 fL — AB (ref 78.0–100.0)
MONOS PCT: 6 %
Monocytes Absolute: 0.8 10*3/uL (ref 0.1–1.0)
NEUTROS ABS: 10.7 10*3/uL — AB (ref 1.7–7.7)
Neutrophils Relative %: 85 %
Platelets: 62 10*3/uL — ABNORMAL LOW (ref 150–400)
RBC: 3.16 MIL/uL — ABNORMAL LOW (ref 3.87–5.11)
RDW: 17.2 % — ABNORMAL HIGH (ref 11.5–15.5)
WBC: 12.6 10*3/uL — ABNORMAL HIGH (ref 4.0–10.5)

## 2015-10-12 LAB — COMPREHENSIVE METABOLIC PANEL
ALBUMIN: 3.3 g/dL — AB (ref 3.5–5.0)
ALT: 42 U/L (ref 14–54)
AST: 22 U/L (ref 15–41)
Alkaline Phosphatase: 88 U/L (ref 38–126)
Anion gap: 9 (ref 5–15)
BILIRUBIN TOTAL: 0.3 mg/dL (ref 0.3–1.2)
BUN: 14 mg/dL (ref 6–20)
CHLORIDE: 102 mmol/L (ref 101–111)
CO2: 28 mmol/L (ref 22–32)
Calcium: 8.5 mg/dL — ABNORMAL LOW (ref 8.9–10.3)
Creatinine, Ser: 0.84 mg/dL (ref 0.44–1.00)
GFR calc Af Amer: >60 mL/min (ref >60)
GFR calc non Af Amer: >60 mL/min (ref >60)
GLUCOSE: 116 mg/dL — AB (ref 65–99)
POTASSIUM: 2.9 mmol/L — AB (ref 3.5–5.1)
Sodium: 139 mmol/L (ref 135–145)
Total Protein: 6.4 g/dL — ABNORMAL LOW (ref 6.5–8.1)

## 2015-10-12 LAB — TROPONIN I

## 2015-10-12 LAB — BRAIN NATRIURETIC PEPTIDE: B Natriuretic Peptide: 255.1 pg/mL — ABNORMAL HIGH (ref 0.0–100.0)

## 2015-10-12 MED ORDER — LEVOFLOXACIN 500 MG PO TABS: 500.0000 mg | ORAL_TABLET | Freq: Every day | ORAL | 0 refills | Status: DC

## 2015-10-12 MED ORDER — HEPARIN SOD (PORK) LOCK FLUSH 100 UNIT/ML IV SOLN
500.0000 [IU] | Freq: Once | INTRAVENOUS | Status: AC
  Administered 2015-10-12: 500 [IU]
  Filled 2015-10-12: qty 5

## 2015-10-12 MED ORDER — POTASSIUM CHLORIDE 10 MEQ/100ML IV SOLN
10.0000 meq | Freq: Once | INTRAVENOUS | Status: AC
  Administered 2015-10-12: 10 meq via INTRAVENOUS
  Filled 2015-10-12: qty 100

## 2015-10-12 MED ORDER — POTASSIUM CHLORIDE CRYS ER 20 MEQ PO TBCR: 20.0000 meq | EXTENDED_RELEASE_TABLET | Freq: Two times a day (BID) | ORAL | 0 refills | Status: DC

## 2015-10-12 MED ORDER — ACETAMINOPHEN 325 MG PO TABS
650.0000 mg | ORAL_TABLET | Freq: Once | ORAL | Status: AC
  Administered 2015-10-12: 650 mg via ORAL
  Filled 2015-10-12: qty 2

## 2015-10-12 MED ORDER — FENTANYL CITRATE (PF) 100 MCG/2ML IJ SOLN
50.0000 ug | Freq: Once | INTRAMUSCULAR | Status: DC
  Filled 2015-10-12: qty 2

## 2015-10-12 MED ORDER — LIDOCAINE-PRILOCAINE 2.5-2.5 % EX CREA
TOPICAL_CREAM | Freq: Once | CUTANEOUS | Status: AC
  Administered 2015-10-12: 1 via TOPICAL
  Filled 2015-10-12: qty 5

## 2015-10-12 NOTE — ED Triage Notes (Signed)
Pt presents with no acute distress. Pt states SOB with CP. Used Proventil without relief. DVT/ lung clot/ 3 weeks ago. CHEMO/ 1 week ago. SOB at rest. Increase in leg swelling/ more than usually with weakness

## 2015-10-12 NOTE — ED Provider Notes (Signed)
[facility] DEPT Provider Note   CSN: [id number] Arrival date & time: 10/12/15  1053     History   Chief Complaint Chief Complaint  Patient presents with  . Shortness of Breath  . Lung Cancer    chemo 1 week ago  . DVT    lung/ 3weeks ago    HPI Jasmine Grant is a 60 y.o. female.  The history is provided by the patient.  Shortness of Breath  Associated symptoms include cough and leg swelling. Pertinent negatives include no headaches, no chest pain and no abdominal pain.  Patient presents with shortness of breath. She had chemotherapy week ago for her lung cancer. States she normally feels bad with chemotherapy in particular bad if she gets the Neulasta with it. She states that she is normally feeling better by Monday and now she is feeling worse. States she feels if she has the flu. She has had a cough with some yellow sputum that is been going on for a while. No fevers. No headache. States she's been caring more fluid on her legs also. Recently diagnosed with pulmonary embolism and is on Xarelto. She's had 2 chemotherapy since the PE and this one is much more severe of the 2. He has not recently been on antibiotics but does occasionally need antibiotics for her COPD.  Past Medical History:  Diagnosis Date  . Arthritis 04/27/2014  . Drug-induced hypokalemia 05/26/2015  . Dysuria 11/03/2014  . Essential hypertension 04/27/2014  . Headache disorder 06/08/2015  . Hx of thyroid cancer    10 years ago  . Insomnia 12/29/2013  . 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 Panic attack   . S/P radiation therapy completed 12/16/13-02/05/14   RULL lung  . Thyroid ca [facility]) dx'd 20+yrs ago   surg only  . UTI (lower urinary tract infection) 05/26/2015  . UTI (urinary tract infection) 04/29/2015    Patient Active Problem List   Diagnosis Date Noted  . Atypical chest pain 10/05/2015  . Dizziness  10/05/2015  . Pulmonary embolism ([place]) 09/15/2015  . Pulmonary embolus ([place]) 09/15/2015  . Radiation pneumonitis ([place]) 08/18/2015  . DNR (do not resuscitate) 08/18/2015  . Acute bronchitis due to infection 07/27/2015  . Leukocytosis 07/07/2015  . Headache disorder 06/08/2015  . UTI (lower urinary tract infection) 05/26/2015  . Drug-induced hypokalemia 05/26/2015  . Dehydration 05/26/2015  . Chemotherapy-induced nausea 05/26/2015  . Palliative care by specialist 05/18/2015  . Thrombocytopenia ([place]) 02/24/2015  . Other specified hypothyroidism 02/03/2015  . Numbness and tingling of right arm 01/12/2015  . Rash 11/26/2014  . COPD (chronic obstructive pulmonary disease) ([place]) 11/25/2014  . Pancytopenia due to antineoplastic chemotherapy ([place]) 11/10/2014  . 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 Dysuria 11/03/2014  . Cough 10/06/2014  . 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 Protein calorie malnutrition ([place]) 08/26/2014  . Elevated serum creatinine 08/26/2014  . Right shoulder pain 06/17/2014  . 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 Arthritis 04/27/2014  . Bilateral leg edema 04/27/2014  . Sinusitis, chronic 04/27/2014  . Essential hypertension 04/27/2014  . Other constipation 04/27/2014  . Cellulitis 04/20/2014  . Anemia due to antineoplastic chemotherapy 03/16/2014  . Cyst of skin 02/26/2014  . Other fatigue 02/26/2014  . Leukopenia due to antineoplastic chemotherapy 01/28/2014  . Radiation-induced dermatitis 01/28/2014  . Esophagitis 01/07/2014  . Hypersensitivity reaction 01/04/2014  . Insomnia 12/29/2013  .  Constipation due to opioid therapy 12/17/2013  . Adenocarcinoma of upper lobe of right lung   . Nausea with vomiting 12/16/2013  . Elevated blood pressure 12/15/2013  . Generalized anxiety disorder 12/14/2013  . Acquired hypothyroidism 12/14/2013  . Overweight (BMI 25.0-29.9) 12/14/2013  . Epidural mass 12/13/2013  . 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  Pathologic compression fracture of spine     Past Surgical History:  Procedure Laterality Date  . THYROIDECTOMY      OB History    No data available       Home Medications    Prior to Admission medications   Medication Sig Start Date End Date Taking? Authorizing Provider  albuterol (PROVENTIL HFA;VENTOLIN HFA) 108 (90 BASE) MCG/ACT inhaler Inhale 2 puffs into the lungs every 4 (four) hours as needed for wheezing or shortness of breath. 11/25/14  Yes Collene Gobble, MD  amLODipine (NORVASC) 10 MG tablet Take 1 tablet (10 mg total) by mouth at bedtime. Patient taking differently: Take 10 mg by mouth daily as needed (high blood pressure).  07/13/15  Yes [name], MD  levothyroxine (SYNTHROID, LEVOTHROID) 200 MCG tablet TAKE 1 TABLET BY MOUTH DAILY BEFORE BREAKFAST. 06/30/15  Yes [name], MD  lisinopril (PRINIVIL) 10 MG tablet Take 1 tablet (10 mg total) by mouth at bedtime. Patient taking differently: Take 10 mg by mouth at bedtime as needed (high blood pressure).  07/13/15  Yes Ni Gorsuch, MD  LORazepam (ATIVAN) 0.5 MG tablet TAKE 1 TABLET BY MOUTH EVERY 8 HOURS AS NEEDED FOR ANXIETY OR FOR NAUSEA 12/27/14  Yes [name], MD  mirtazapine (REMERON) 15 MG tablet TAKE 1 TABLET (15 MG TOTAL) BY MOUTH AT BEDTIME AS NEEDED (FOR SLEEP.). Patient taking differently: Take 15 mg by mouth at bedtime.  04/20/15  Yes Ni Gorsuch, MD  ondansetron (ZOFRAN) 8 MG tablet TAKE 1 TABLET BY MOUTH EVERY 6 HOURS AS NEEDED FOR NAUSEA. 12/31/14  Yes [name], MD  predniSONE (DELTASONE) 20 MG tablet TAKE 1 TABLET BY MOUTH DAILY WITH BREAKFAST. Patient taking differently: TAKE '20mg'$  TABLET BY MOUTH DAILY WITH BREAKFAST. 09/26/15  Yes [name], MD  ranitidine (ZANTAC) 150 MG tablet Take 1 tablet (150 mg total) by mouth every evening. 12/31/14  Yes [name], MD  rivaroxaban (XARELTO) 20 MG TABS tablet Take 1 tablet (20 mg total) by mouth daily with supper. 10/04/15  Yes [name], MD  umeclidinium-vilanterol  (ANORO ELLIPTA) 62.5-25 MCG/INH AEPB Inhale 1 puff into the lungs daily. 09/22/15  Yes Collene Gobble, MD  fluticasone (FLONASE) 50 MCG/ACT nasal spray Place 2 sprays into both nostrils daily. Patient not taking: Reported on 10/12/2015 11/25/14   Collene Gobble, MD  hydrochlorothiazide (HYDRODIURIL) 25 MG tablet TAKE 1 TABLET BY MOUTH EVERY DAY Patient not taking: Reported on 10/12/2015 06/21/15   [name], MD  levofloxacin (LEVAQUIN) 500 MG tablet Take 1 tablet (500 mg total) by mouth daily. 10/12/15   Davonna Belling, MD  potassium chloride SA (K-DUR,KLOR-CON) 20 MEQ tablet Take 1 tablet (20 mEq total) by mouth 2 (two) times daily. 10/12/15   Davonna Belling, MD  prochlorperazine (COMPAZINE) 10 MG tablet TAKE 1 TABLET BY MOUTH EVERY 6 (SIX) HOURS AS NEEDED FOR NAUSEA OR VOMITING. Patient not taking: Reported on 10/12/2015 06/16/15   [name], MD    Family History Family History  Problem Relation Age of Onset  . Heart disease Mother   . Heart disease Father   . Thyroid cancer Father  Social History Social History  Substance Use Topics  . Smoking status: Former Smoker    Packs/day: 1.00    Years: 30.00    Types: Cigarettes    Quit date: 01/29/2009  . Smokeless tobacco: Never Used  . Alcohol use 0.0 oz/week     Allergies   Codeine; Penicillins; and Sulfa antibiotics   Review of Systems Review of Systems  Constitutional: Positive for fatigue. Negative for appetite change.  HENT: Negative for congestion.   Eyes: Negative for photophobia.  Respiratory: Positive for cough, chest tightness and shortness of breath.   Cardiovascular: Positive for leg swelling. Negative for chest pain.  Gastrointestinal: Negative for abdominal pain and blood in stool.  Endocrine: Negative for polyuria.  Genitourinary: Negative for dyspareunia.  Musculoskeletal: Negative for back pain.  Neurological: Positive for weakness. Negative for headaches.  Hematological: Negative for adenopathy.    Psychiatric/Behavioral: Negative for behavioral problems and confusion.     Physical Exam Updated Vital Signs BP 110/57   Pulse 69   Temp 98.2 F (36.8 C) (Oral)   Resp 12   Ht '6\' 2"'$  (1.88 m)   Wt 234 lb (106.1 kg)   SpO2 98%   BMI 30.04 kg/m   Physical Exam  Constitutional: She appears well-developed.  HENT:  Head: Atraumatic.  Neck: Neck supple. No JVD present.  Cardiovascular: Normal rate.   Pulmonary/Chest: Effort normal.  Mildly harsh breath sounds. No frank wheezes. No rales. No rhonchi  Abdominal: Soft. There is no tenderness.  Musculoskeletal: She exhibits edema.  Edema bilateral lower legs.  Skin: Skin is warm. Capillary refill takes less than 2 seconds.  Psychiatric: She has a normal mood and affect.     ED Treatments / Results  Labs (all labs ordered are listed, but only abnormal results are displayed) Labs Reviewed  COMPREHENSIVE METABOLIC PANEL - Abnormal; Notable for the following:       Result Value   Potassium 2.9 (*)    Glucose, Bld 116 (*)    Calcium 8.5 (*)    Total Protein 6.4 (*)    Albumin 3.3 (*)    All other components within normal limits  BRAIN NATRIURETIC PEPTIDE - Abnormal; Notable for the following:    B Natriuretic Peptide 255.1 (*)    All other components within normal limits  CBC WITH DIFFERENTIAL/PLATELET - Abnormal; Notable for the following:    WBC 12.6 (*)    RBC 3.16 (*)    Hemoglobin 9.7 (*)    HCT 31.8 (*)    MCV 100.6 (*)    RDW 17.2 (*)    Platelets 62 (*)    Neutro Abs 10.7 (*)    All other components within normal limits  TROPONIN I    EKG  EKG Interpretation  Date/Time:  Wednesday October 12 2015 11:55:27 EDT Ventricular Rate:  70 PR Interval:    QRS Duration: 100 QT Interval:  391 QTC Calculation: 422 R Axis:   49 Text Interpretation:  Sinus rhythm Confirmed by Alvino Chapel  MD, Ovid Curd [phone]) on 10/12/2015 12:07:23 PM       Radiology Dg Chest 2 View  Result Date: 10/12/2015 CLINICAL DATA:   Onset of shortness of breath at rest 3 days ago with increasing symptoms; history of pulmonary embolism 3 weeks ago. History of lung malignancy with chemotherapy 1 week ago. EXAM: CHEST  2 VIEW COMPARISON:  PA and lateral chest x-ray of October 05, 2015 FINDINGS: The lungs are adequately inflated. There is stable right paratracheal density in  the apex. There is no focal infiltrate elsewhere. There is stable nodularity in the left lower lobe posteriorly measuring approximately 1.1 cm in diameter. The heart and pulmonary vascularity are normal. The trachea is midline. There is calcification in the wall of the aortic arch. The power port catheter tip projects over the junction of the middle and distal thirds of the SVC. The bony thorax exhibits no acute abnormality. IMPRESSION: No acute pneumonia. Stable abnormal soft tissue mass in the right apex. Stable 11 mm diameter left lower lobe nodule. No significant pleural effusion. No CHF. Aortic atherosclerosis. Electronically Signed   By: David  Martinique M.D.   On: 10/12/2015 12:40   Ct Head Wo Contrast  Result Date: 10/12/2015 CLINICAL DATA:  Headache EXAM: CT HEAD WITHOUT CONTRAST TECHNIQUE: Contiguous axial images were obtained from the base of the skull through the vertex without intravenous contrast. COMPARISON:  MRI 06/13/2015 FINDINGS: Brain: No acute intracranial abnormality. Specifically, no hemorrhage, hydrocephalus, mass lesion, acute infarction, or significant intracranial injury. Vascular: No hyperdense vessel or unexpected calcification. Skull: No acute calvarial abnormality. Sinuses/Orbits: Visualized paranasal sinuses and mastoids clear. Orbital soft tissues unremarkable. Other: None IMPRESSION: Negative study. Electronically Signed   By: Rolm Baptise M.D.   On: 10/12/2015 14:22    Procedures Procedures (including critical care time)  Medications Ordered in ED Medications  fentaNYL (SUBLIMAZE) injection 50 mcg (50 mcg Intravenous Refused 10/12/15  1403)  potassium chloride 10 mEq in 100 mL IVPB (10 mEq Intravenous New Bag/Given 10/12/15 1449)  lidocaine-prilocaine (EMLA) cream (1 application Topical Given 10/12/15 1143)  acetaminophen (TYLENOL) tablet 650 mg (650 mg Oral Given 10/12/15 1437)     Initial Impression / Assessment and Plan / ED Course  I have reviewed the triage vital signs and the nursing notes.  Pertinent labs & imaging results that were available during my care of the patient were reviewed by me and considered in my medical decision making (see chart for details).  Clinical Course    Patient with shortness of breath and fatigue. Recent chemotherapy. Has had a cough with some sputum production. X-ray reassuring. Labs overall reassuring. Feels somewhat better. Mild hypokalemia with potassium of 2.9. Supplement IV and will supplement oral. Will discharge home. Patient is artery on steroids for her chemotherapy/lungs.  Final Clinical Impressions(s) / ED Diagnoses   Final diagnoses:  COPD exacerbation (HCC)  Hypokalemia    New Prescriptions New Prescriptions   LEVOFLOXACIN (LEVAQUIN) 500 MG TABLET    Take 1 tablet (500 mg total) by mouth daily.   POTASSIUM CHLORIDE SA (K-DUR,KLOR-CON) 20 MEQ TABLET    Take 1 tablet (20 mEq total) by mouth 2 (two) times daily.     Davonna Belling, MD 10/12/15 [phone]

## 2015-10-12 NOTE — ED Notes (Signed)
MD at bedside. EDP PICKERING PRESENT

## 2015-10-12 NOTE — ED Notes (Signed)
MD at bedside. EDP PICKERING UPDATING PT AND FAMILY

## 2015-10-12 NOTE — ED Notes (Signed)
Patient transported to CT 

## 2015-10-12 NOTE — ED Notes (Signed)
Patient transported to X-ray 

## 2015-10-16 ENCOUNTER — Other Ambulatory Visit: Payer: Self-pay | Admitting: Hematology and Oncology

## 2015-10-17 ENCOUNTER — Telehealth: Payer: Self-pay | Admitting: Emergency Medicine

## 2015-10-17 DIAGNOSIS — J449 Chronic obstructive pulmonary disease, unspecified: Secondary | ICD-10-CM

## 2015-10-17 NOTE — Telephone Encounter (Signed)
Spoke with pt and she states that she does not feel the inhalers are helping her. She would like to try using a nebulizer for her medications.   RB is not in office.  RA - Please advise as to nebulizer and medications. Thanks!

## 2015-10-17 NOTE — Telephone Encounter (Signed)
Patient returned call, CB is [phone]

## 2015-10-17 NOTE — Telephone Encounter (Signed)
LMTCB

## 2015-10-18 ENCOUNTER — Ambulatory Visit: Payer: BLUE CROSS/BLUE SHIELD | Admitting: Hematology and Oncology

## 2015-10-18 NOTE — Telephone Encounter (Signed)
Spoke with pt. She is aware of RA's recommendation. Will route message to RB to address when he returns.

## 2015-10-18 NOTE — Telephone Encounter (Signed)
From RB note 'Unclear how much of her dyspnea is related to COPD versus the pulmonary embolism and her known lung cancer'  Not sure that nebulizer will be more effective then inhaler Can wait until [place] comes back

## 2015-10-19 ENCOUNTER — Other Ambulatory Visit (HOSPITAL_BASED_OUTPATIENT_CLINIC_OR_DEPARTMENT_OTHER): Payer: BLUE CROSS/BLUE SHIELD

## 2015-10-19 ENCOUNTER — Ambulatory Visit: Payer: BLUE CROSS/BLUE SHIELD

## 2015-10-19 ENCOUNTER — Ambulatory Visit (HOSPITAL_BASED_OUTPATIENT_CLINIC_OR_DEPARTMENT_OTHER): Payer: BLUE CROSS/BLUE SHIELD | Admitting: Hematology and Oncology

## 2015-10-19 ENCOUNTER — Ambulatory Visit (HOSPITAL_BASED_OUTPATIENT_CLINIC_OR_DEPARTMENT_OTHER): Payer: BLUE CROSS/BLUE SHIELD

## 2015-10-19 ENCOUNTER — Encounter: Payer: Self-pay | Admitting: Hematology and Oncology

## 2015-10-19 VITALS — BP 154/85 | HR 94 | Temp 98.3°F | Resp 18 | Ht 74.0 in | Wt 238.0 lb

## 2015-10-19 DIAGNOSIS — Z7189 Other specified counseling: Secondary | ICD-10-CM | POA: Insufficient documentation

## 2015-10-19 DIAGNOSIS — C3411 Malignant neoplasm of upper lobe, right bronchus or lung: Secondary | ICD-10-CM | POA: Diagnosis not present

## 2015-10-19 DIAGNOSIS — G620 Drug-induced polyneuropathy: Secondary | ICD-10-CM | POA: Diagnosis not present

## 2015-10-19 DIAGNOSIS — E038 Other specified hypothyroidism: Secondary | ICD-10-CM

## 2015-10-19 DIAGNOSIS — E039 Hypothyroidism, unspecified: Secondary | ICD-10-CM

## 2015-10-19 DIAGNOSIS — D72829 Elevated white blood cell count, unspecified: Secondary | ICD-10-CM

## 2015-10-19 DIAGNOSIS — Z66 Do not resuscitate: Secondary | ICD-10-CM

## 2015-10-19 DIAGNOSIS — Z5111 Encounter for antineoplastic chemotherapy: Secondary | ICD-10-CM

## 2015-10-19 DIAGNOSIS — Z515 Encounter for palliative care: Secondary | ICD-10-CM

## 2015-10-19 DIAGNOSIS — T451X5A Adverse effect of antineoplastic and immunosuppressive drugs, initial encounter: Secondary | ICD-10-CM

## 2015-10-19 LAB — CBC WITH DIFFERENTIAL/PLATELET
BASO%: 0.2 % (ref 0.0–2.0)
BASOS ABS: 0 10*3/uL (ref 0.0–0.1)
EOS%: 0.5 % (ref 0.0–7.0)
Eosinophils Absolute: 0.1 10*3/uL (ref 0.0–0.5)
HEMATOCRIT: 33.6 % — AB (ref 34.8–46.6)
HEMOGLOBIN: 10.2 g/dL — AB (ref 11.6–15.9)
LYMPH#: 1.5 10*3/uL (ref 0.9–3.3)
LYMPH%: 10.1 % — ABNORMAL LOW (ref 14.0–49.7)
MCH: 31.6 pg (ref 25.1–34.0)
MCHC: 30.4 g/dL — ABNORMAL LOW (ref 31.5–36.0)
MCV: 104 fL — ABNORMAL HIGH (ref 79.5–101.0)
MONO#: 1.3 10*3/uL — ABNORMAL HIGH (ref 0.1–0.9)
MONO%: 8.7 % (ref 0.0–14.0)
NEUT#: 12 10*3/uL — ABNORMAL HIGH (ref 1.5–6.5)
NEUT%: 80.5 % — ABNORMAL HIGH (ref 38.4–76.8)
Platelets: 202 10*3/uL (ref 145–400)
RBC: 3.23 10*6/uL — ABNORMAL LOW (ref 3.70–5.45)
RDW: 18.4 % — AB (ref 11.2–14.5)
WBC: 14.9 10*3/uL — ABNORMAL HIGH (ref 3.9–10.3)

## 2015-10-19 LAB — COMPREHENSIVE METABOLIC PANEL
ALBUMIN: 3.2 g/dL — AB (ref 3.5–5.0)
ALK PHOS: 128 U/L (ref 40–150)
ALT: 21 U/L (ref 0–55)
AST: 12 U/L (ref 5–34)
Anion Gap: 12 mEq/L — ABNORMAL HIGH (ref 3–11)
BILIRUBIN TOTAL: 0.23 mg/dL (ref 0.20–1.20)
BUN: 14.8 mg/dL (ref 7.0–26.0)
CALCIUM: 9 mg/dL (ref 8.4–10.4)
CO2: 24 mEq/L (ref 22–29)
CREATININE: 1.1 mg/dL (ref 0.6–1.1)
Chloride: 106 mEq/L (ref 98–109)
EGFR: 57 mL/min/{1.73_m2} — ABNORMAL LOW (ref >90)
Glucose: 163 mg/dl — ABNORMAL HIGH (ref 70–140)
POTASSIUM: 3.6 meq/L (ref 3.5–5.1)
Sodium: 142 mEq/L (ref 136–145)
TOTAL PROTEIN: 7.1 g/dL (ref 6.4–8.3)

## 2015-10-19 MED ORDER — SODIUM CHLORIDE 0.9% FLUSH
10.0000 mL | INTRAVENOUS | Status: DC | PRN
  Administered 2015-10-19: 10 mL
  Filled 2015-10-19: qty 10

## 2015-10-19 MED ORDER — PALONOSETRON HCL INJECTION 0.25 MG/5ML
0.2500 mg | Freq: Once | INTRAVENOUS | Status: AC
  Administered 2015-10-19: 0.25 mg via INTRAVENOUS

## 2015-10-19 MED ORDER — VINORELBINE TARTRATE CHEMO INJECTION 50 MG/5ML
17.0000 mg/m2 | Freq: Once | INTRAVENOUS | Status: AC
  Administered 2015-10-19: 40 mg via INTRAVENOUS
  Filled 2015-10-19: qty 4

## 2015-10-19 MED ORDER — PALONOSETRON HCL INJECTION 0.25 MG/5ML
INTRAVENOUS | Status: AC
  Filled 2015-10-19: qty 5

## 2015-10-19 MED ORDER — SODIUM CHLORIDE 0.9 % IV SOLN
INTRAVENOUS | Status: DC
  Administered 2015-10-19: 11:00:00 via INTRAVENOUS

## 2015-10-19 MED ORDER — HEPARIN SOD (PORK) LOCK FLUSH 100 UNIT/ML IV SOLN
500.0000 [IU] | Freq: Once | INTRAVENOUS | Status: AC | PRN
  Administered 2015-10-19: 500 [IU]
  Filled 2015-10-19: qty 5

## 2015-10-19 MED ORDER — SODIUM CHLORIDE 0.9 % IV SOLN
700.0000 mg/m2 | Freq: Once | INTRAVENOUS | Status: AC
  Administered 2015-10-19: 1672 mg via INTRAVENOUS
  Filled 2015-10-19: qty 43.97

## 2015-10-19 MED ORDER — SODIUM CHLORIDE 0.9 % IV SOLN
Freq: Once | INTRAVENOUS | Status: AC
  Administered 2015-10-19: 11:00:00 via INTRAVENOUS
  Filled 2015-10-19: qty 5

## 2015-10-19 NOTE — Assessment & Plan Note (Signed)
This is likely due to recent Neulasta injection & prednisone therapy. Observe only

## 2015-10-19 NOTE — Assessment & Plan Note (Signed)
The patient have chronic fatigue. I will recheck thyroid function test next week. Her most recent blood work were within acceptable range

## 2015-10-19 NOTE — Progress Notes (Signed)
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      12/13/2013 Imaging    CT scan show large mass in the right lung apex likely representing primary lung carcinoma or large metastasis. Metastases demonstrated in the pretracheal lymph nodes, T1 vertebral body and possibly sacrum.      12/14/2013 Initial Diagnosis    Adenocarcinoma of upper lobe of right lung      12/14/2013 Pathology Results    Accession: [id number] biopsy from right upper lobe showed adenocarcinoma. [facility] testing was positive for several mutations without any known treatment options now      12/17/2013 Imaging    MRI of the head is negative.      12/23/2013 - 02/05/2014 Radiation Therapy    She received radiation therapy to the mediastinal mass and bone      12/28/2013 Imaging    PET/CT scan showed mediastinal mass, lymphadenopathy and T1 involvement.      01/01/2014 - 01/29/2014 Chemotherapy    Weekly carboplatin and Taxol were added      03/12/2014 Imaging    Repeat PET scan showed near complete response to treatment.      03/16/2014 - 05/28/2014 Chemotherapy    She started treatment with Alimta for maintenance      07/07/2014 Imaging    PET scan showed disease progression      07/15/2014 - 09/23/2014 Chemotherapy    She received palliative treatment with Opdivo      08/13/2014 - 08/25/2014 Radiation Therapy    She completed  palliative radiation therapy to the bone.      10/05/2014 Imaging    PET CT showed disease progression except for site of recent radiation therapy      10/13/2014 - 04/06/2015 Chemotherapy    She received Rx with weekly carbo/taxol, 2 weeks on 1 week off with Avastin every 3 weeks      12/15/2014 Imaging    PET scan showed positive response to Rx      01/18/2015 Imaging    Ct chest showed disease improvement      04/19/2015 Imaging    Ct chest diease progression      04/27/2015 -  Chemotherapy    Treatment is switched to Gemzar and Navelbine      05/04/2015 Adverse Reaction    Treatment was missed & interrupted due to recurrent UTI      06/13/2015 Imaging    MRI head is negative      08/17/2015 Imaging    CT imaging showed positive response to Rx      09/15/2015 - 09/17/2015 Hospital Admission    She was admitted to the hospital due to diagnosis of acute pulmonary embolism      09/15/2015 Imaging    Ct angiogram showed acute PE       INTERVAL HISTORY: Please see below for problem oriented  charting. She returns prior to cycle 8 of treatment. She continues to have pulmonary symptoms with chest congestion, productive cough, shortness of breath on minimal exertion and fatigue. The patient denies any recent signs or symptoms of bleeding such as spontaneous epistaxis, hematuria or hematochezia. She complained of mild worsening neuropathy She denies nausea or changes in bowel habits  REVIEW OF SYSTEMS:   Constitutional: Denies fevers, chills or abnormal weight loss Eyes: Denies blurriness of vision Ears, nose, mouth, throat, and face: Denies mucositis or sore throat Cardiovascular: Denies palpitation, chest discomfort or lower extremity swelling Gastrointestinal:  Denies nausea, heartburn or change in bowel habits Skin: Denies abnormal skin rashes Lymphatics: Denies new lymphadenopathy or easy bruising Behavioral/Psych: Mood is stable, no new changes  All other systems  were reviewed with the patient and are negative.  I have reviewed the past medical history, past surgical history, social history and family history with the patient and they are unchanged from previous note.  ALLERGIES:  is allergic to codeine; penicillins; and sulfa antibiotics.  MEDICATIONS:  Current Outpatient Prescriptions  Medication Sig Dispense Refill  . albuterol (PROVENTIL HFA;VENTOLIN HFA) 108 (90 BASE) MCG/ACT inhaler Inhale 2 puffs into the lungs every 4 (four) hours as needed for wheezing or shortness of breath. 1 Inhaler 5  . amLODipine (NORVASC) 10 MG tablet Take 1 tablet (10 mg total) by mouth at bedtime. (Patient taking differently: Take 10 mg by mouth daily as needed (high blood pressure). ) 90 tablet 2  . fluticasone (FLONASE) 50 MCG/ACT nasal spray Place 2 sprays into both nostrils daily. 16 g 5  . hydrochlorothiazide (HYDRODIURIL) 25 MG tablet TAKE 1 TABLET BY MOUTH EVERY DAY 30 tablet 1  . levothyroxine (SYNTHROID, LEVOTHROID) 200 MCG tablet TAKE 1 TABLET BY MOUTH DAILY BEFORE BREAKFAST. 90 tablet 0  . lisinopril (PRINIVIL) 10 MG tablet Take 1 tablet (10 mg total) by mouth at bedtime. (Patient taking differently: Take 10 mg by mouth at bedtime as needed (high blood pressure). ) 30 tablet 9  . LORazepam (ATIVAN) 0.5 MG tablet TAKE 1 TABLET BY MOUTH EVERY 8 HOURS AS NEEDED FOR ANXIETY OR FOR NAUSEA 90 tablet 0  . mirtazapine (REMERON) 15 MG tablet TAKE 1 TABLET (15 MG TOTAL) BY MOUTH AT BEDTIME AS NEEDED (FOR SLEEP.). (Patient taking differently: Take 15 mg by mouth at bedtime. ) 90 tablet 6  . ondansetron (ZOFRAN) 8 MG tablet TAKE 1 TABLET BY MOUTH EVERY 6 HOURS AS NEEDED FOR NAUSEA. 90 tablet 0  . potassium chloride SA (K-DUR,KLOR-CON) 20 MEQ tablet Take 1 tablet (20 mEq total) by mouth 2 (two) times daily. 6 tablet 0  . predniSONE (DELTASONE) 20 MG tablet TAKE 1 TABLET BY MOUTH DAILY WITH BREAKFAST. (Patient taking differently: TAKE '20mg'$  TABLET BY MOUTH DAILY WITH  BREAKFAST.) 30 tablet 0  . prochlorperazine (COMPAZINE) 10 MG tablet TAKE 1 TABLET BY MOUTH EVERY 6 (SIX) HOURS AS NEEDED FOR NAUSEA OR VOMITING. 30 tablet 1  . ranitidine (ZANTAC) 150 MG tablet Take 1 tablet (150 mg total) by mouth every evening. 90 tablet 2  . rivaroxaban (XARELTO) 20 MG TABS tablet Take 1 tablet (20 mg total) by mouth daily with supper. 30 tablet 9  . umeclidinium-vilanterol (ANORO ELLIPTA) 62.5-25 MCG/INH AEPB Inhale 1 puff into the lungs daily. 60 each 2   No current facility-administered medications for this visit.    Facility-Administered Medications Ordered in Other Visits  Medication Dose Route Frequency Provider Last Rate Last Dose  . 0.9 %  sodium  chloride infusion   Intravenous Continuous [name], MD 20 mL/hr at 10/19/15 1107    . fosaprepitant (EMEND) 150 mg, dexamethasone (DECADRON) 12 mg in sodium chloride 0.9 % 145 mL IVPB   Intravenous Once [name], MD 454 mL/hr at 10/19/15 1109    . gemcitabine (GEMZAR) 1,672 mg in sodium chloride 0.9 % 100 mL chemo infusion  700 mg/m2 (Treatment Plan Recorded) Intravenous Once [name], MD      . heparin lock flush 100 unit/mL  500 Units Intracatheter Once PRN [name], MD      . sodium chloride flush (NS) 0.9 % injection 10 mL  10 mL Intracatheter PRN [name], MD      . vinorelbine (NAVELBINE) 40 mg in sodium chloride 0.9 % 50 mL chemo infusion  17 mg/m2 (Treatment Plan Recorded) Intravenous Once [name], MD        PHYSICAL EXAMINATION: ECOG PERFORMANCE STATUS: 2 - Symptomatic, <50% confined to bed  Vitals:   10/19/15 1017  BP: (!) 154/85  Pulse: 94  Resp: 18  Temp: 98.3 F (36.8 C)   Filed Weights   10/19/15 1017  Weight: 238 lb (108 kg)    GENERAL:alert, no distress and comfortable. She appears cushingoid SKIN: skin color, texture, turgor are normal, no rashes or significant lesions EYES: normal, Conjunctiva are pink and non-injected, sclera clear OROPHARYNX:no exudate, no erythema and lips,  buccal mucosa, and tongue normal  NECK: supple, thyroid normal size, non-tender, without nodularity LYMPH:  no palpable lymphadenopathy in the cervical, axillary or inguinal LUNGS: clear to auscultation with scattered wheezes, mild increased breathing effort HEART: regular rate & rhythm and no murmurs and no lower extremity edema ABDOMEN:abdomen soft, non-tender and normal bowel sounds Musculoskeletal:no cyanosis of digits and no clubbing  NEURO: alert & oriented x 3 with fluent speech, no focal motor/sensory deficits  LABORATORY DATA:  I have reviewed the data as listed    Component Value Date/Time   NA 142 10/19/2015 0925   K 3.6 10/19/2015 0925   CL 102 10/12/2015 1304   CO2 24 10/19/2015 0925   GLUCOSE 163 (H) 10/19/2015 0925   BUN 14.8 10/19/2015 0925   CREATININE 1.1 10/19/2015 0925   CALCIUM 9.0 10/19/2015 0925   PROT 7.1 10/19/2015 0925   ALBUMIN 3.2 (L) 10/19/2015 0925   AST 12 10/19/2015 0925   ALT 21 10/19/2015 0925   ALKPHOS 128 10/19/2015 0925   BILITOT 0.23 10/19/2015 0925   GFRNONAA >60 10/12/2015 1304   GFRAA >60 10/12/2015 1304    No results found for: SPEP, UPEP  Lab Results  Component Value Date   WBC 14.9 (H) 10/19/2015   NEUTROABS 12.0 (H) 10/19/2015   HGB 10.2 (L) 10/19/2015   HCT 33.6 (L) 10/19/2015   MCV 104.0 (H) 10/19/2015   PLT 202 10/19/2015      Chemistry      Component Value Date/Time   NA 142 10/19/2015 0925   K 3.6 10/19/2015 0925   CL 102 10/12/2015 1304   CO2 24 10/19/2015 0925   BUN 14.8 10/19/2015 0925   CREATININE 1.1 10/19/2015 0925      Component Value Date/Time   CALCIUM 9.0 10/19/2015 0925   ALKPHOS 128 10/19/2015 0925   AST 12 10/19/2015 0925   ALT 21 10/19/2015 0925   BILITOT 0.23 10/19/2015 0925     ASSESSMENT & PLAN:  Adenocarcinoma of upper lobe of right lung She is having persistent symptoms of fatigue, shortness of breath, low-grade fever, peripheral  neuropathy and productive cough. She denies  hemoptysis. We discussed the goals of care and the patient once to continue treatment without delay. I will see her back in 6 weeks for further assessment Her next CT scan would be due in November 2017 We will proceed with treatment today  Goals of care, counseling/discussion We discussed the goals of care. The patient has progressed to multiple lines of treatment and she is miserable with chronic persistent respiratory symptoms.  She inquired about placement of bronchial stent and I review with her the risk and complications related to bronchial stents. She also inquired about lung transplant and the patient is not a candidate due to cancer diagnosis. Ultimately, after extensive discussion, the patient would like to continue on treatment.  Palliative care by specialist CODE STATUS is DO NOT RESUSCITATE  Neuropathy due to chemotherapeutic drug [facility]) she has mild peripheral neuropathy, likely related to side effects of treatment. It is only mild, not bothering the patient. I will observe for now If it gets worse in the future, I will consider modifying the dose of the treatment   Leukocytosis This is likely due to recent Neulasta injection & prednisone therapy. Observe only  Acquired hypothyroidism The patient have chronic fatigue. I will recheck thyroid function test next week. Her most recent blood work were within acceptable range   Orders Placed This Encounter  Procedures  . TSH    Standing Status:   Future    Standing Expiration Date:   11/22/2016  . T4, free    Standing Status:   Future    Standing Expiration Date:   11/22/2016   All questions were answered. The patient knows to call the clinic with any problems, questions or concerns. No barriers to learning was detected. I spent 25 minutes counseling the patient face to face. The total time spent in the appointment was 40 minutes and more than 50% was on counseling and review of test results     [facility], [place],  MD 10/19/2015 11:19 AM

## 2015-10-19 NOTE — Assessment & Plan Note (Signed)
CODE STATUS is DO NOT RESUSCITATE

## 2015-10-19 NOTE — Assessment & Plan Note (Signed)
We discussed the goals of care. The patient has progressed to multiple lines of treatment and she is miserable with chronic persistent respiratory symptoms.  She inquired about placement of bronchial stent and I review with her the risk and complications related to bronchial stents. She also inquired about lung transplant and the patient is not a candidate due to cancer diagnosis. Ultimately, after extensive discussion, the patient would like to continue on treatment.

## 2015-10-19 NOTE — Assessment & Plan Note (Signed)
She is having persistent symptoms of fatigue, shortness of breath, low-grade fever, peripheral neuropathy and productive cough. She denies hemoptysis. We discussed the goals of care and the patient once to continue treatment without delay. I will see her back in 6 weeks for further assessment Her next CT scan would be due in November 2017 We will proceed with treatment today

## 2015-10-19 NOTE — Assessment & Plan Note (Signed)
she has mild peripheral neuropathy, likely related to side effects of treatment. It is only mild, not bothering the patient. I will observe for now If it gets worse in the future, I will consider modifying the dose of the treatment  

## 2015-10-19 NOTE — Patient Instructions (Signed)
[facility] Discharge Instructions for Patients Receiving Chemotherapy  Today you received the following chemotherapy agents Navelbine and Gemzar.  To help prevent nausea and vomiting after your treatment, we encourage you to take your nausea medication.   If you develop nausea and vomiting that is not controlled by your nausea medication, call the clinic.   BELOW ARE SYMPTOMS THAT SHOULD BE REPORTED IMMEDIATELY:  *FEVER GREATER THAN 100.5 F  *CHILLS WITH OR WITHOUT FEVER  NAUSEA AND VOMITING THAT IS NOT CONTROLLED WITH YOUR NAUSEA MEDICATION  *UNUSUAL SHORTNESS OF BREATH  *UNUSUAL BRUISING OR BLEEDING  TENDERNESS IN MOUTH AND THROAT WITH OR WITHOUT PRESENCE OF ULCERS  *URINARY PROBLEMS  *BOWEL PROBLEMS  UNUSUAL RASH Items with * indicate a potential emergency and should be followed up as soon as possible.  Feel free to call the clinic you have any questions or concerns. The clinic phone number is [phone].  Please show the [place] at check-in to the Emergency Department and triage nurse.

## 2015-10-25 NOTE — Telephone Encounter (Signed)
LMTCB

## 2015-10-25 NOTE — Telephone Encounter (Signed)
ok with me to do a trial of nebulized albuterol to see if she benefits. q4h prn for sob or wheeze

## 2015-10-26 ENCOUNTER — Other Ambulatory Visit (HOSPITAL_BASED_OUTPATIENT_CLINIC_OR_DEPARTMENT_OTHER): Payer: BLUE CROSS/BLUE SHIELD

## 2015-10-26 ENCOUNTER — Ambulatory Visit (HOSPITAL_BASED_OUTPATIENT_CLINIC_OR_DEPARTMENT_OTHER): Payer: BLUE CROSS/BLUE SHIELD

## 2015-10-26 ENCOUNTER — Ambulatory Visit: Payer: BLUE CROSS/BLUE SHIELD

## 2015-10-26 VITALS — BP 155/77 | HR 73 | Temp 98.2°F | Resp 18

## 2015-10-26 DIAGNOSIS — C3411 Malignant neoplasm of upper lobe, right bronchus or lung: Secondary | ICD-10-CM

## 2015-10-26 DIAGNOSIS — E038 Other specified hypothyroidism: Secondary | ICD-10-CM

## 2015-10-26 DIAGNOSIS — Z5111 Encounter for antineoplastic chemotherapy: Secondary | ICD-10-CM | POA: Diagnosis not present

## 2015-10-26 LAB — CBC WITH DIFFERENTIAL/PLATELET
BASO%: 0.3 % (ref 0.0–2.0)
Basophils Absolute: 0 10*3/uL (ref 0.0–0.1)
EOS%: 0.4 % (ref 0.0–7.0)
Eosinophils Absolute: 0 10*3/uL (ref 0.0–0.5)
HCT: 31.7 % — ABNORMAL LOW (ref 34.8–46.6)
HGB: 10 g/dL — ABNORMAL LOW (ref 11.6–15.9)
LYMPH%: 8.4 % — AB (ref 14.0–49.7)
MCH: 31.6 pg (ref 25.1–34.0)
MCHC: 31.4 g/dL — ABNORMAL LOW (ref 31.5–36.0)
MCV: 100.5 fL (ref 79.5–101.0)
MONO#: 0.3 10*3/uL (ref 0.1–0.9)
MONO%: 3.8 % (ref 0.0–14.0)
NEUT%: 87.1 % — ABNORMAL HIGH (ref 38.4–76.8)
NEUTROS ABS: 6.9 10*3/uL — AB (ref 1.5–6.5)
Platelets: 187 10*3/uL (ref 145–400)
RBC: 3.16 10*6/uL — AB (ref 3.70–5.45)
RDW: 19.7 % — AB (ref 11.2–14.5)
WBC: 7.9 10*3/uL (ref 3.9–10.3)
lymph#: 0.7 10*3/uL — ABNORMAL LOW (ref 0.9–3.3)

## 2015-10-26 LAB — COMPREHENSIVE METABOLIC PANEL
ALT: 53 U/L (ref 0–55)
AST: 23 U/L (ref 5–34)
Albumin: 3.1 g/dL — ABNORMAL LOW (ref 3.5–5.0)
Alkaline Phosphatase: 98 U/L (ref 40–150)
Anion Gap: 12 mEq/L — ABNORMAL HIGH (ref 3–11)
BUN: 20 mg/dL (ref 7.0–26.0)
CHLORIDE: 105 meq/L (ref 98–109)
CO2: 24 meq/L (ref 22–29)
Calcium: 9 mg/dL (ref 8.4–10.4)
Creatinine: 0.9 mg/dL (ref 0.6–1.1)
EGFR: 67 mL/min/{1.73_m2} — ABNORMAL LOW (ref >90)
Glucose: 136 mg/dl (ref 70–140)
Potassium: 4 mEq/L (ref 3.5–5.1)
SODIUM: 141 meq/L (ref 136–145)
TOTAL PROTEIN: 6.9 g/dL (ref 6.4–8.3)
Total Bilirubin: 0.33 mg/dL (ref 0.20–1.20)

## 2015-10-26 LAB — TSH: TSH: 5.473 m[IU]/L — AB (ref 0.308–3.960)

## 2015-10-26 MED ORDER — SODIUM CHLORIDE 0.9 % IV SOLN
700.0000 mg/m2 | Freq: Once | INTRAVENOUS | Status: AC
  Administered 2015-10-26: 1672 mg via INTRAVENOUS
  Filled 2015-10-26: qty 43.97

## 2015-10-26 MED ORDER — SODIUM CHLORIDE 0.9% FLUSH
10.0000 mL | INTRAVENOUS | Status: DC | PRN
  Administered 2015-10-26: 10 mL
  Filled 2015-10-26: qty 10

## 2015-10-26 MED ORDER — VINORELBINE TARTRATE CHEMO INJECTION 50 MG/5ML
17.0000 mg/m2 | Freq: Once | INTRAVENOUS | Status: AC
  Administered 2015-10-26: 40 mg via INTRAVENOUS
  Filled 2015-10-26: qty 4

## 2015-10-26 MED ORDER — SODIUM CHLORIDE 0.9 % IJ SOLN
10.0000 mL | INTRAMUSCULAR | Status: DC | PRN
  Administered 2015-10-26: 10 mL via INTRAVENOUS
  Filled 2015-10-26: qty 10

## 2015-10-26 MED ORDER — HEPARIN SOD (PORK) LOCK FLUSH 100 UNIT/ML IV SOLN
500.0000 [IU] | Freq: Once | INTRAVENOUS | Status: AC | PRN
  Administered 2015-10-26: 500 [IU]
  Filled 2015-10-26: qty 5

## 2015-10-26 MED ORDER — SODIUM CHLORIDE 0.9 % IV SOLN
Freq: Once | INTRAVENOUS | Status: AC
  Administered 2015-10-26: 10:00:00 via INTRAVENOUS
  Filled 2015-10-26: qty 5

## 2015-10-26 MED ORDER — SODIUM CHLORIDE 0.9 % IV SOLN
INTRAVENOUS | Status: DC
  Administered 2015-10-26: 10:00:00 via INTRAVENOUS

## 2015-10-26 MED ORDER — PALONOSETRON HCL INJECTION 0.25 MG/5ML
INTRAVENOUS | Status: AC
  Filled 2015-10-26: qty 5

## 2015-10-26 MED ORDER — PALONOSETRON HCL INJECTION 0.25 MG/5ML
0.2500 mg | Freq: Once | INTRAVENOUS | Status: AC
  Administered 2015-10-26: 0.25 mg via INTRAVENOUS

## 2015-10-26 NOTE — Patient Instructions (Signed)

## 2015-10-26 NOTE — Telephone Encounter (Signed)
lmtcb x2 for pt. 

## 2015-10-26 NOTE — Patient Instructions (Signed)
[facility] Discharge Instructions for Patients Receiving Chemotherapy  Today you received the following chemotherapy agents Navelbine and Gemzar.  To help prevent nausea and vomiting after your treatment, we encourage you to take your nausea medication.   If you develop nausea and vomiting that is not controlled by your nausea medication, call the clinic.   BELOW ARE SYMPTOMS THAT SHOULD BE REPORTED IMMEDIATELY:  *FEVER GREATER THAN 100.5 F  *CHILLS WITH OR WITHOUT FEVER  NAUSEA AND VOMITING THAT IS NOT CONTROLLED WITH YOUR NAUSEA MEDICATION  *UNUSUAL SHORTNESS OF BREATH  *UNUSUAL BRUISING OR BLEEDING  TENDERNESS IN MOUTH AND THROAT WITH OR WITHOUT PRESENCE OF ULCERS  *URINARY PROBLEMS  *BOWEL PROBLEMS  UNUSUAL RASH Items with * indicate a potential emergency and should be followed up as soon as possible.  Feel free to call the clinic you have any questions or concerns. The clinic phone number is [phone].  Please show the [place] at check-in to the Emergency Department and triage nurse.

## 2015-10-27 ENCOUNTER — Ambulatory Visit (HOSPITAL_BASED_OUTPATIENT_CLINIC_OR_DEPARTMENT_OTHER): Payer: BLUE CROSS/BLUE SHIELD

## 2015-10-27 VITALS — BP 140/77 | HR 91 | Temp 98.4°F | Resp 20

## 2015-10-27 DIAGNOSIS — Z5189 Encounter for other specified aftercare: Secondary | ICD-10-CM | POA: Diagnosis not present

## 2015-10-27 DIAGNOSIS — C3411 Malignant neoplasm of upper lobe, right bronchus or lung: Secondary | ICD-10-CM

## 2015-10-27 LAB — T4, FREE: T4,Free(Direct): 1.79 ng/dL — ABNORMAL HIGH (ref 0.82–1.77)

## 2015-10-27 MED ORDER — PEGFILGRASTIM INJECTION 6 MG/0.6ML
6.0000 mg | Freq: Once | SUBCUTANEOUS | Status: AC
  Administered 2015-10-27: 6 mg via SUBCUTANEOUS
  Filled 2015-10-27: qty 0.6

## 2015-10-27 NOTE — Telephone Encounter (Signed)
lmtcb x3 for pt. 

## 2015-10-27 NOTE — Patient Instructions (Signed)
Pegfilgrastim injection What is this medicine? PEGFILGRASTIM (PEG fil gra stim) is a long-acting granulocyte colony-stimulating factor that stimulates the growth of neutrophils, a type of white blood cell important in the body's fight against infection. It is used to reduce the incidence of fever and infection in patients with certain types of cancer who are receiving chemotherapy that affects the bone marrow, and to increase survival after being exposed to high doses of radiation. This medicine may be used for other purposes; ask your health care provider or pharmacist if you have questions. What should I tell my health care provider before I take this medicine? They need to know if you have any of these conditions: -kidney disease -latex allergy -ongoing radiation therapy -sickle cell disease -skin reactions to acrylic adhesives (On-Body Injector only) -an unusual or allergic reaction to pegfilgrastim, filgrastim, other medicines, foods, dyes, or preservatives -pregnant or trying to get pregnant -breast-feeding How should I use this medicine? This medicine is for injection under the skin. If you get this medicine at home, you will be taught how to prepare and give the pre-filled syringe or how to use the On-body Injector. Refer to the patient Instructions for Use for detailed instructions. Use exactly as directed. Take your medicine at regular intervals. Do not take your medicine more often than directed. It is important that you put your used needles and syringes in a special sharps container. Do not put them in a trash can. If you do not have a sharps container, call your pharmacist or healthcare provider to get one. Talk to your pediatrician regarding the use of this medicine in children. While this drug may be prescribed for selected conditions, precautions do apply. Overdosage: If you think you have taken too much of this medicine contact a poison control center or emergency room at  once. NOTE: This medicine is only for you. Do not share this medicine with others. What if I miss a dose? It is important not to miss your dose. Call your doctor or health care professional if you miss your dose. If you miss a dose due to an On-body Injector failure or leakage, a new dose should be administered as soon as possible using a single prefilled syringe for manual use. What may interact with this medicine? Interactions have not been studied. Give your health care provider a list of all the medicines, herbs, non-prescription drugs, or dietary supplements you use. Also tell them if you smoke, drink alcohol, or use illegal drugs. Some items may interact with your medicine. This list may not describe all possible interactions. Give your health care provider a list of all the medicines, herbs, non-prescription drugs, or dietary supplements you use. Also tell them if you smoke, drink alcohol, or use illegal drugs. Some items may interact with your medicine. What should I watch for while using this medicine? You may need blood work done while you are taking this medicine. If you are going to need a MRI, CT scan, or other procedure, tell your doctor that you are using this medicine (On-Body Injector only). What side effects may I notice from receiving this medicine? Side effects that you should report to your doctor or health care professional as soon as possible: -allergic reactions like skin rash, itching or hives, swelling of the face, lips, or tongue -dizziness -fever -pain, redness, or irritation at site where injected -pinpoint red spots on the skin -red or dark-brown urine -shortness of breath or breathing problems -stomach or side pain, or pain   at the shoulder -swelling -tiredness -trouble passing urine or change in the amount of urine Side effects that usually do not require medical attention (report to your doctor or health care professional if they continue or are  bothersome): -bone pain -muscle pain This list may not describe all possible side effects. Call your doctor for medical advice about side effects. You may report side effects to FDA at 1-800-FDA-1088. Where should I keep my medicine? Keep out of the reach of children. Store pre-filled syringes in a refrigerator between 2 and 8 degrees C (36 and 46 degrees F). Do not freeze. Keep in carton to protect from light. Throw away this medicine if it is left out of the refrigerator for more than 48 hours. Throw away any unused medicine after the expiration date. NOTE: This sheet is a summary. It may not cover all possible information. If you have questions about this medicine, talk to your doctor, pharmacist, or health care provider.    2016, Elsevier/Gold Standard. (2014-02-04 14:30:14)  

## 2015-10-28 MED ORDER — ALBUTEROL SULFATE (2.5 MG/3ML) 0.083% IN NEBU: 2.5000 mg | INHALATION_SOLUTION | RESPIRATORY_TRACT | 2 refills | Status: AC | PRN

## 2015-10-28 NOTE — Telephone Encounter (Signed)
Spoke with pt. She is aware of RB's recommendation. Rx has been sent in. Order for nebulizer machine has been placed. Nothing further was needed.

## 2015-10-28 NOTE — Addendum Note (Signed)
Addended by: Desmond Dike C on: 10/28/2015 08:16 AM   Modules accepted: Orders

## 2015-10-28 NOTE — Telephone Encounter (Signed)
lmtcb x4 for pt. Per triage protocol, message will be closed.

## 2015-11-06 ENCOUNTER — Other Ambulatory Visit: Payer: Self-pay | Admitting: Hematology and Oncology

## 2015-11-09 ENCOUNTER — Ambulatory Visit: Payer: BLUE CROSS/BLUE SHIELD

## 2015-11-09 ENCOUNTER — Other Ambulatory Visit (HOSPITAL_BASED_OUTPATIENT_CLINIC_OR_DEPARTMENT_OTHER): Payer: Self-pay

## 2015-11-09 ENCOUNTER — Ambulatory Visit (HOSPITAL_BASED_OUTPATIENT_CLINIC_OR_DEPARTMENT_OTHER): Payer: Self-pay

## 2015-11-09 VITALS — BP 128/67 | HR 84 | Temp 98.9°F | Resp 18

## 2015-11-09 DIAGNOSIS — C3411 Malignant neoplasm of upper lobe, right bronchus or lung: Secondary | ICD-10-CM

## 2015-11-09 DIAGNOSIS — Z5111 Encounter for antineoplastic chemotherapy: Secondary | ICD-10-CM

## 2015-11-09 LAB — CBC WITH DIFFERENTIAL/PLATELET
BASO%: 0.3 % (ref 0.0–2.0)
Basophils Absolute: 0.1 10*3/uL (ref 0.0–0.1)
EOS%: 0.1 % (ref 0.0–7.0)
Eosinophils Absolute: 0 10*3/uL (ref 0.0–0.5)
HEMATOCRIT: 30.2 % — AB (ref 34.8–46.6)
HGB: 9.2 g/dL — ABNORMAL LOW (ref 11.6–15.9)
LYMPH#: 1.4 10*3/uL (ref 0.9–3.3)
LYMPH%: 7.6 % — AB (ref 14.0–49.7)
MCH: 31.3 pg (ref 25.1–34.0)
MCHC: 30.4 g/dL — AB (ref 31.5–36.0)
MCV: 103 fL — ABNORMAL HIGH (ref 79.5–101.0)
MONO#: 1.4 10*3/uL — ABNORMAL HIGH (ref 0.1–0.9)
MONO%: 7.8 % (ref 0.0–14.0)
NEUT#: 15 10*3/uL — ABNORMAL HIGH (ref 1.5–6.5)
NEUT%: 84.2 % — AB (ref 38.4–76.8)
Platelets: 272 10*3/uL (ref 145–400)
RBC: 2.93 10*6/uL — AB (ref 3.70–5.45)
RDW: 21.3 % — ABNORMAL HIGH (ref 11.2–14.5)
WBC: 17.8 10*3/uL — AB (ref 3.9–10.3)

## 2015-11-09 LAB — COMPREHENSIVE METABOLIC PANEL
ALT: 18 U/L (ref 0–55)
AST: 12 U/L (ref 5–34)
Albumin: 3.2 g/dL — ABNORMAL LOW (ref 3.5–5.0)
Alkaline Phosphatase: 152 U/L — ABNORMAL HIGH (ref 40–150)
Anion Gap: 12 mEq/L — ABNORMAL HIGH (ref 3–11)
BUN: 12.5 mg/dL (ref 7.0–26.0)
CHLORIDE: 107 meq/L (ref 98–109)
CO2: 25 meq/L (ref 22–29)
CREATININE: 0.9 mg/dL (ref 0.6–1.1)
Calcium: 8.9 mg/dL (ref 8.4–10.4)
EGFR: 65 mL/min/{1.73_m2} — ABNORMAL LOW (ref >90)
GLUCOSE: 113 mg/dL (ref 70–140)
Potassium: 3.4 mEq/L — ABNORMAL LOW (ref 3.5–5.1)
SODIUM: 144 meq/L (ref 136–145)
Total Bilirubin: 0.41 mg/dL (ref 0.20–1.20)
Total Protein: 6.8 g/dL (ref 6.4–8.3)

## 2015-11-09 MED ORDER — HEPARIN SOD (PORK) LOCK FLUSH 100 UNIT/ML IV SOLN
500.0000 [IU] | Freq: Once | INTRAVENOUS | Status: AC | PRN
  Administered 2015-11-09: 500 [IU]
  Filled 2015-11-09: qty 5

## 2015-11-09 MED ORDER — SODIUM CHLORIDE 0.9 % IJ SOLN
10.0000 mL | INTRAMUSCULAR | Status: DC | PRN
  Administered 2015-11-09: 10 mL via INTRAVENOUS
  Filled 2015-11-09: qty 10

## 2015-11-09 MED ORDER — SODIUM CHLORIDE 0.9 % IV SOLN
INTRAVENOUS | Status: DC
  Administered 2015-11-09: 12:00:00 via INTRAVENOUS

## 2015-11-09 MED ORDER — PALONOSETRON HCL INJECTION 0.25 MG/5ML
0.2500 mg | Freq: Once | INTRAVENOUS | Status: AC
  Administered 2015-11-09: 0.25 mg via INTRAVENOUS

## 2015-11-09 MED ORDER — VINORELBINE TARTRATE CHEMO INJECTION 50 MG/5ML
17.0000 mg/m2 | Freq: Once | INTRAVENOUS | Status: AC
  Administered 2015-11-09: 40 mg via INTRAVENOUS
  Filled 2015-11-09: qty 4

## 2015-11-09 MED ORDER — SODIUM CHLORIDE 0.9 % IV SOLN
700.0000 mg/m2 | Freq: Once | INTRAVENOUS | Status: AC
  Administered 2015-11-09: 1672 mg via INTRAVENOUS
  Filled 2015-11-09: qty 43.97

## 2015-11-09 MED ORDER — PALONOSETRON HCL INJECTION 0.25 MG/5ML
INTRAVENOUS | Status: AC
  Filled 2015-11-09: qty 5

## 2015-11-09 MED ORDER — FOSAPREPITANT DIMEGLUMINE INJECTION 150 MG
Freq: Once | INTRAVENOUS | Status: AC
  Administered 2015-11-09: 13:00:00 via INTRAVENOUS
  Filled 2015-11-09: qty 5

## 2015-11-09 MED ORDER — SODIUM CHLORIDE 0.9% FLUSH
10.0000 mL | INTRAVENOUS | Status: DC | PRN
Start: 2015-11-09 — End: 2015-11-09
  Administered 2015-11-09: 10 mL
  Filled 2015-11-09: qty 10

## 2015-11-09 NOTE — Progress Notes (Signed)
Blood return noted before and after Navelbine infusion.

## 2015-11-09 NOTE — Patient Instructions (Signed)
[facility] Discharge Instructions for Patients Receiving Chemotherapy  Today you received the following chemotherapy agents: Gemzar and Navalbine  To help prevent nausea and vomiting after your treatment, we encourage you to take your nausea medication as directed.    If you develop nausea and vomiting that is not controlled by your nausea medication, call the clinic.   BELOW ARE SYMPTOMS THAT SHOULD BE REPORTED IMMEDIATELY:  *FEVER GREATER THAN 100.5 F  *CHILLS WITH OR WITHOUT FEVER  NAUSEA AND VOMITING THAT IS NOT CONTROLLED WITH YOUR NAUSEA MEDICATION  *UNUSUAL SHORTNESS OF BREATH  *UNUSUAL BRUISING OR BLEEDING  TENDERNESS IN MOUTH AND THROAT WITH OR WITHOUT PRESENCE OF ULCERS  *URINARY PROBLEMS  *BOWEL PROBLEMS  UNUSUAL RASH Items with * indicate a potential emergency and should be followed up as soon as possible.  Feel free to call the clinic you have any questions or concerns. The clinic phone number is [phone].  Please show the [place] at check-in to the Emergency Department and triage nurse.

## 2015-11-16 ENCOUNTER — Ambulatory Visit (HOSPITAL_BASED_OUTPATIENT_CLINIC_OR_DEPARTMENT_OTHER): Payer: Self-pay

## 2015-11-16 ENCOUNTER — Other Ambulatory Visit (HOSPITAL_BASED_OUTPATIENT_CLINIC_OR_DEPARTMENT_OTHER): Payer: Self-pay

## 2015-11-16 VITALS — BP 144/87 | HR 87 | Temp 99.3°F | Resp 20

## 2015-11-16 DIAGNOSIS — Z5111 Encounter for antineoplastic chemotherapy: Secondary | ICD-10-CM

## 2015-11-16 DIAGNOSIS — C3411 Malignant neoplasm of upper lobe, right bronchus or lung: Secondary | ICD-10-CM

## 2015-11-16 LAB — COMPREHENSIVE METABOLIC PANEL
ALBUMIN: 3.3 g/dL — AB (ref 3.5–5.0)
ALK PHOS: 113 U/L (ref 40–150)
ALT: 21 U/L (ref 0–55)
AST: 13 U/L (ref 5–34)
Anion Gap: 13 mEq/L — ABNORMAL HIGH (ref 3–11)
BUN: 16.8 mg/dL (ref 7.0–26.0)
CALCIUM: 9.4 mg/dL (ref 8.4–10.4)
CO2: 23 mEq/L (ref 22–29)
Chloride: 105 mEq/L (ref 98–109)
Creatinine: 1 mg/dL (ref 0.6–1.1)
EGFR: 58 mL/min/{1.73_m2} — AB (ref >90)
Glucose: 167 mg/dl — ABNORMAL HIGH (ref 70–140)
POTASSIUM: 3.7 meq/L (ref 3.5–5.1)
Sodium: 141 mEq/L (ref 136–145)
Total Bilirubin: 0.27 mg/dL (ref 0.20–1.20)
Total Protein: 7.2 g/dL (ref 6.4–8.3)

## 2015-11-16 LAB — CBC WITH DIFFERENTIAL/PLATELET
BASO%: 0.1 % (ref 0.0–2.0)
BASOS ABS: 0 10*3/uL (ref 0.0–0.1)
EOS ABS: 0 10*3/uL (ref 0.0–0.5)
EOS%: 0.2 % (ref 0.0–7.0)
HEMATOCRIT: 30.3 % — AB (ref 34.8–46.6)
HEMOGLOBIN: 9.3 g/dL — AB (ref 11.6–15.9)
LYMPH#: 0.8 10*3/uL — AB (ref 0.9–3.3)
LYMPH%: 8.1 % — ABNORMAL LOW (ref 14.0–49.7)
MCH: 31.5 pg (ref 25.1–34.0)
MCHC: 30.7 g/dL — ABNORMAL LOW (ref 31.5–36.0)
MCV: 102.7 fL — AB (ref 79.5–101.0)
MONO#: 0.7 10*3/uL (ref 0.1–0.9)
MONO%: 6.4 % (ref 0.0–14.0)
NEUT#: 8.7 10*3/uL — ABNORMAL HIGH (ref 1.5–6.5)
NEUT%: 85.2 % — ABNORMAL HIGH (ref 38.4–76.8)
Platelets: 173 10*3/uL (ref 145–400)
RBC: 2.95 10*6/uL — ABNORMAL LOW (ref 3.70–5.45)
RDW: 17.9 % — AB (ref 11.2–14.5)
WBC: 10.3 10*3/uL (ref 3.9–10.3)

## 2015-11-16 MED ORDER — VINORELBINE TARTRATE CHEMO INJECTION 50 MG/5ML
17.0000 mg/m2 | Freq: Once | INTRAVENOUS | Status: AC
  Administered 2015-11-16: 40 mg via INTRAVENOUS
  Filled 2015-11-16: qty 4

## 2015-11-16 MED ORDER — PALONOSETRON HCL INJECTION 0.25 MG/5ML
0.2500 mg | Freq: Once | INTRAVENOUS | Status: AC
  Administered 2015-11-16: 0.25 mg via INTRAVENOUS

## 2015-11-16 MED ORDER — SODIUM CHLORIDE 0.9% FLUSH
10.0000 mL | INTRAVENOUS | Status: DC | PRN
  Administered 2015-11-16: 10 mL
  Filled 2015-11-16: qty 10

## 2015-11-16 MED ORDER — HEPARIN SOD (PORK) LOCK FLUSH 100 UNIT/ML IV SOLN
500.0000 [IU] | Freq: Once | INTRAVENOUS | Status: AC | PRN
  Administered 2015-11-16: 500 [IU]
  Filled 2015-11-16: qty 5

## 2015-11-16 MED ORDER — SODIUM CHLORIDE 0.9 % IV SOLN
INTRAVENOUS | Status: DC
  Administered 2015-11-16: 10:00:00 via INTRAVENOUS

## 2015-11-16 MED ORDER — PALONOSETRON HCL INJECTION 0.25 MG/5ML
INTRAVENOUS | Status: AC
  Filled 2015-11-16: qty 5

## 2015-11-16 MED ORDER — SODIUM CHLORIDE 0.9 % IV SOLN
700.0000 mg/m2 | Freq: Once | INTRAVENOUS | Status: AC
  Administered 2015-11-16: 1672 mg via INTRAVENOUS
  Filled 2015-11-16: qty 43.97

## 2015-11-16 MED ORDER — SODIUM CHLORIDE 0.9 % IV SOLN
Freq: Once | INTRAVENOUS | Status: AC
  Administered 2015-11-16: 10:00:00 via INTRAVENOUS
  Filled 2015-11-16: qty 5

## 2015-11-16 NOTE — Patient Instructions (Signed)
[facility] Discharge Instructions for Patients Receiving Chemotherapy  Today you received the following chemotherapy agents: Gemzar and Navalbine  To help prevent nausea and vomiting after your treatment, we encourage you to take your nausea medication as directed.    If you develop nausea and vomiting that is not controlled by your nausea medication, call the clinic.   BELOW ARE SYMPTOMS THAT SHOULD BE REPORTED IMMEDIATELY:  *FEVER GREATER THAN 100.5 F  *CHILLS WITH OR WITHOUT FEVER  NAUSEA AND VOMITING THAT IS NOT CONTROLLED WITH YOUR NAUSEA MEDICATION  *UNUSUAL SHORTNESS OF BREATH  *UNUSUAL BRUISING OR BLEEDING  TENDERNESS IN MOUTH AND THROAT WITH OR WITHOUT PRESENCE OF ULCERS  *URINARY PROBLEMS  *BOWEL PROBLEMS  UNUSUAL RASH Items with * indicate a potential emergency and should be followed up as soon as possible.  Feel free to call the clinic you have any questions or concerns. The clinic phone number is [phone].  Please show the [place] at check-in to the Emergency Department and triage nurse.

## 2015-11-17 ENCOUNTER — Ambulatory Visit (HOSPITAL_BASED_OUTPATIENT_CLINIC_OR_DEPARTMENT_OTHER): Payer: Self-pay

## 2015-11-17 VITALS — BP 160/65 | HR 100 | Temp 98.2°F | Resp 18

## 2015-11-17 DIAGNOSIS — Z5189 Encounter for other specified aftercare: Secondary | ICD-10-CM

## 2015-11-17 DIAGNOSIS — C3411 Malignant neoplasm of upper lobe, right bronchus or lung: Secondary | ICD-10-CM

## 2015-11-17 MED ORDER — PEGFILGRASTIM INJECTION 6 MG/0.6ML
6.0000 mg | Freq: Once | SUBCUTANEOUS | Status: AC
  Administered 2015-11-17: 6 mg via SUBCUTANEOUS
  Filled 2015-11-17: qty 0.6

## 2015-11-17 NOTE — Patient Instructions (Signed)
Pegfilgrastim injection What is this medicine? PEGFILGRASTIM (PEG fil gra stim) is a long-acting granulocyte colony-stimulating factor that stimulates the growth of neutrophils, a type of white blood cell important in the body's fight against infection. It is used to reduce the incidence of fever and infection in patients with certain types of cancer who are receiving chemotherapy that affects the bone marrow, and to increase survival after being exposed to high doses of radiation. This medicine may be used for other purposes; ask your health care provider or pharmacist if you have questions. What should I tell my health care provider before I take this medicine? They need to know if you have any of these conditions: -kidney disease -latex allergy -ongoing radiation therapy -sickle cell disease -skin reactions to acrylic adhesives (On-Body Injector only) -an unusual or allergic reaction to pegfilgrastim, filgrastim, other medicines, foods, dyes, or preservatives -pregnant or trying to get pregnant -breast-feeding How should I use this medicine? This medicine is for injection under the skin. If you get this medicine at home, you will be taught how to prepare and give the pre-filled syringe or how to use the On-body Injector. Refer to the patient Instructions for Use for detailed instructions. Use exactly as directed. Take your medicine at regular intervals. Do not take your medicine more often than directed. It is important that you put your used needles and syringes in a special sharps container. Do not put them in a trash can. If you do not have a sharps container, call your pharmacist or healthcare provider to get one. Talk to your pediatrician regarding the use of this medicine in children. While this drug may be prescribed for selected conditions, precautions do apply. Overdosage: If you think you have taken too much of this medicine contact a poison control center or emergency room at  once. NOTE: This medicine is only for you. Do not share this medicine with others. What if I miss a dose? It is important not to miss your dose. Call your doctor or health care professional if you miss your dose. If you miss a dose due to an On-body Injector failure or leakage, a new dose should be administered as soon as possible using a single prefilled syringe for manual use. What may interact with this medicine? Interactions have not been studied. Give your health care provider a list of all the medicines, herbs, non-prescription drugs, or dietary supplements you use. Also tell them if you smoke, drink alcohol, or use illegal drugs. Some items may interact with your medicine. This list may not describe all possible interactions. Give your health care provider a list of all the medicines, herbs, non-prescription drugs, or dietary supplements you use. Also tell them if you smoke, drink alcohol, or use illegal drugs. Some items may interact with your medicine. What should I watch for while using this medicine? You may need blood work done while you are taking this medicine. If you are going to need a MRI, CT scan, or other procedure, tell your doctor that you are using this medicine (On-Body Injector only). What side effects may I notice from receiving this medicine? Side effects that you should report to your doctor or health care professional as soon as possible: -allergic reactions like skin rash, itching or hives, swelling of the face, lips, or tongue -dizziness -fever -pain, redness, or irritation at site where injected -pinpoint red spots on the skin -red or dark-brown urine -shortness of breath or breathing problems -stomach or side pain, or pain   at the shoulder -swelling -tiredness -trouble passing urine or change in the amount of urine Side effects that usually do not require medical attention (report to your doctor or health care professional if they continue or are  bothersome): -bone pain -muscle pain This list may not describe all possible side effects. Call your doctor for medical advice about side effects. You may report side effects to FDA at 1-800-FDA-1088. Where should I keep my medicine? Keep out of the reach of children. Store pre-filled syringes in a refrigerator between 2 and 8 degrees C (36 and 46 degrees F). Do not freeze. Keep in carton to protect from light. Throw away this medicine if it is left out of the refrigerator for more than 48 hours. Throw away any unused medicine after the expiration date. NOTE: This sheet is a summary. It may not cover all possible information. If you have questions about this medicine, talk to your doctor, pharmacist, or health care provider.    2016, Elsevier/Gold Standard. (2014-02-04 14:30:14)  

## 2015-11-18 ENCOUNTER — Ambulatory Visit: Payer: BLUE CROSS/BLUE SHIELD

## 2015-11-27 ENCOUNTER — Other Ambulatory Visit: Payer: Self-pay

## 2015-11-27 ENCOUNTER — Emergency Department (HOSPITAL_COMMUNITY)
Admission: EM | Admit: 2015-11-27 | Discharge: 2015-11-27 | Disposition: A | Discharge status: Home / Self Care | Payer: BLUE CROSS/BLUE SHIELD | Attending: Emergency Medicine | Admitting: Emergency Medicine

## 2015-11-27 ENCOUNTER — Encounter (HOSPITAL_COMMUNITY): Payer: Self-pay

## 2015-11-27 ENCOUNTER — Emergency Department (HOSPITAL_COMMUNITY): Payer: BLUE CROSS/BLUE SHIELD

## 2015-11-27 DIAGNOSIS — Z85118 Personal history of other malignant neoplasm of bronchus and lung: Secondary | ICD-10-CM | POA: Insufficient documentation

## 2015-11-27 DIAGNOSIS — Z85850 Personal history of malignant neoplasm of thyroid: Secondary | ICD-10-CM | POA: Insufficient documentation

## 2015-11-27 DIAGNOSIS — Z7901 Long term (current) use of anticoagulants: Secondary | ICD-10-CM | POA: Insufficient documentation

## 2015-11-27 DIAGNOSIS — R0602 Shortness of breath: Secondary | ICD-10-CM | POA: Insufficient documentation

## 2015-11-27 DIAGNOSIS — J449 Chronic obstructive pulmonary disease, unspecified: Secondary | ICD-10-CM | POA: Insufficient documentation

## 2015-11-27 DIAGNOSIS — Z79899 Other long term (current) drug therapy: Secondary | ICD-10-CM | POA: Insufficient documentation

## 2015-11-27 DIAGNOSIS — Z87891 Personal history of nicotine dependence: Secondary | ICD-10-CM | POA: Insufficient documentation

## 2015-11-27 DIAGNOSIS — I10 Essential (primary) hypertension: Secondary | ICD-10-CM | POA: Insufficient documentation

## 2015-11-27 LAB — CBC WITH DIFFERENTIAL/PLATELET
BASOS ABS: 0 10*3/uL (ref 0.0–0.1)
BASOS PCT: 0 %
EOS ABS: 0.1 10*3/uL (ref 0.0–0.7)
Eosinophils Relative: 0 %
HCT: 27.9 % — ABNORMAL LOW (ref 36.0–46.0)
HEMOGLOBIN: 8.1 g/dL — AB (ref 12.0–15.0)
LYMPHS ABS: 1.3 10*3/uL (ref 0.7–4.0)
Lymphocytes Relative: 8 %
MCH: 31.2 pg (ref 26.0–34.0)
MCHC: 29 g/dL — ABNORMAL LOW (ref 30.0–36.0)
MCV: 107.3 fL — ABNORMAL HIGH (ref 78.0–100.0)
Monocytes Absolute: 1.3 10*3/uL — ABNORMAL HIGH (ref 0.1–1.0)
Monocytes Relative: 7 %
NEUTROS PCT: 85 %
Neutro Abs: 14.9 10*3/uL — ABNORMAL HIGH (ref 1.7–7.7)
PLATELETS: 149 10*3/uL — AB (ref 150–400)
RBC: 2.6 MIL/uL — AB (ref 3.87–5.11)
RDW: 19.7 % — ABNORMAL HIGH (ref 11.5–15.5)
WBC: 17.6 10*3/uL — AB (ref 4.0–10.5)

## 2015-11-27 LAB — BASIC METABOLIC PANEL
Anion gap: 9 (ref 5–15)
BUN: 10 mg/dL (ref 6–20)
CHLORIDE: 107 mmol/L (ref 101–111)
CO2: 28 mmol/L (ref 22–32)
Calcium: 8.8 mg/dL — ABNORMAL LOW (ref 8.9–10.3)
Creatinine, Ser: 0.96 mg/dL (ref 0.44–1.00)
Glucose, Bld: 151 mg/dL — ABNORMAL HIGH (ref 65–99)
POTASSIUM: 2.9 mmol/L — AB (ref 3.5–5.1)
SODIUM: 144 mmol/L (ref 135–145)

## 2015-11-27 LAB — I-STAT TROPONIN, ED: TROPONIN I, POC: 0 ng/mL (ref 0.00–0.08)

## 2015-11-27 MED ORDER — ALBUTEROL SULFATE (2.5 MG/3ML) 0.083% IN NEBU
5.0000 mg | INHALATION_SOLUTION | Freq: Once | RESPIRATORY_TRACT | Status: AC
  Administered 2015-11-27: 5 mg via RESPIRATORY_TRACT
  Filled 2015-11-27: qty 6

## 2015-11-27 MED ORDER — POTASSIUM CHLORIDE CRYS ER 20 MEQ PO TBCR
40.0000 meq | EXTENDED_RELEASE_TABLET | Freq: Once | ORAL | Status: AC
  Administered 2015-11-27: 40 meq via ORAL
  Filled 2015-11-27: qty 2

## 2015-11-27 MED ORDER — HEPARIN SOD (PORK) LOCK FLUSH 100 UNIT/ML IV SOLN
500.0000 [IU] | Freq: Once | INTRAVENOUS | Status: AC
  Administered 2015-11-27: 500 [IU]
  Filled 2015-11-27: qty 5

## 2015-11-27 NOTE — ED Provider Notes (Signed)
[facility] DEPT Provider Note   CSN: [id number] Arrival date & time: 11/27/15  1234     History   Chief Complaint Chief Complaint  Patient presents with  . Shortness of Breath    HPI Jasmine Grant is a 60 y.o. female.  Patient is a 60 year old female with history of lung cancer, thyroid cancer, PE (currently on Xarelto) and hypertension who presents the ED with complaint of shortness of breath, onset this morning. Patient reports over the past week she has had nasal congestion, chills, body aches, dry cough and a few episodes of nonbloody diarrhea which she states have since resolved. Reports having low grade temp at home (98-100). She reports 2 days ago she began to feel like she was having more congestion in her chest but states she has not been able to cough up anything. Patient reports this morning she felt like there was sputum caught in her lungs which caused her to have sudden onset shortness of breath. Patient reports after arrival to the ED is able to cough up a small amount of mucus which gave her significant relief of her shortness of breath. Denies fever, headache, lightheadedness, dizziness, hemoptysis, wheezing, chest pain, abdominal pain, vomiting, urinary symptoms, leg swelling. Patient states she is currently receiving chemotherapy, notes her last treatment was on 10/19 and states she is scheduled for chemotherapy next on 11/30/15.      Past Medical History:  Diagnosis Date  . Arthritis 04/27/2014  . Drug-induced hypokalemia 05/26/2015  . Dysuria 11/03/2014  . Essential hypertension 04/27/2014  . Headache disorder 06/08/2015  . Hx of thyroid cancer    10 years ago  . Insomnia 12/29/2013  . 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 Panic attack   . S/P radiation therapy completed 12/16/13-02/05/14   RULL lung  . Thyroid ca [facility]) dx'd 20+yrs ago   surg only  . UTI (lower urinary  tract infection) 05/26/2015  . UTI (urinary tract infection) 04/29/2015    Patient Active Problem List   Diagnosis Date Noted  . Goals of care, counseling/discussion 10/19/2015  . Atypical chest pain 10/05/2015  . Dizziness 10/05/2015  . Pulmonary embolism ([place]) 09/15/2015  . Pulmonary embolus ([place]) 09/15/2015  . Radiation pneumonitis ([place]) 08/18/2015  . DNR (do not resuscitate) 08/18/2015  . Acute bronchitis due to infection 07/27/2015  . Leukocytosis 07/07/2015  . Headache disorder 06/08/2015  . UTI (lower urinary tract infection) 05/26/2015  . Drug-induced hypokalemia 05/26/2015  . Dehydration 05/26/2015  . Chemotherapy-induced nausea 05/26/2015  . Palliative care by specialist 05/18/2015  . Thrombocytopenia ([place]) 02/24/2015  . Other specified hypothyroidism 02/03/2015  . Numbness and tingling of right arm 01/12/2015  . Rash 11/26/2014  . COPD (chronic obstructive pulmonary disease) ([place]) 11/25/2014  . Pancytopenia due to antineoplastic chemotherapy ([place]) 11/10/2014  . Neuropathy due to chemotherapeutic drug ([place]) 11/10/2014  . Dysuria 11/03/2014  . Cough 10/06/2014  . 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 Protein calorie malnutrition ([place]) 08/26/2014  . Elevated serum creatinine 08/26/2014  . Right shoulder pain 06/17/2014  . 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 Arthritis 04/27/2014  . Bilateral leg edema 04/27/2014  . Sinusitis, chronic 04/27/2014  . Essential hypertension 04/27/2014  . Other constipation 04/27/2014  . Cellulitis 04/20/2014  . Anemia due to antineoplastic chemotherapy 03/16/2014  . Cyst of skin 02/26/2014  . Other fatigue 02/26/2014  . Leukopenia due to antineoplastic chemotherapy ([place])  01/28/2014  . Radiation-induced dermatitis 01/28/2014  . Esophagitis 01/07/2014  . Hypersensitivity reaction 01/04/2014  . Insomnia 12/29/2013  . Constipation due to opioid therapy 12/17/2013  . Adenocarcinoma of upper lobe of right lung   . Nausea with vomiting  12/16/2013  . Elevated blood pressure 12/15/2013  . Generalized anxiety disorder 12/14/2013  . Acquired hypothyroidism 12/14/2013  . Overweight (BMI 25.0-29.9) 12/14/2013  . Epidural mass 12/13/2013  . 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 Pathologic compression fracture of spine [facility])     Past Surgical History:  Procedure Laterality Date  . THYROIDECTOMY      OB History    No data available       Home Medications    Prior to Admission medications   Medication Sig Start Date End Date Taking? Authorizing Provider  albuterol (PROVENTIL HFA;VENTOLIN HFA) 108 (90 BASE) MCG/ACT inhaler Inhale 2 puffs into the lungs every 4 (four) hours as needed for wheezing or shortness of breath. 11/25/14  Yes Collene Gobble, MD  amLODipine (NORVASC) 10 MG tablet Take 1 tablet (10 mg total) by mouth at bedtime. Patient taking differently: Take 10 mg by mouth daily as needed (high blood pressure).  07/13/15  Yes [name], MD  levothyroxine (SYNTHROID, LEVOTHROID) 200 MCG tablet TAKE 1 TABLET BY MOUTH DAILY BEFORE BREAKFAST. 10/17/15  Yes [name], MD  lisinopril (PRINIVIL) 10 MG tablet Take 1 tablet (10 mg total) by mouth at bedtime. Patient taking differently: Take 10 mg by mouth at bedtime as needed (high blood pressure).  07/13/15  Yes [name], MD  mirtazapine (REMERON) 15 MG tablet TAKE 1 TABLET (15 MG TOTAL) BY MOUTH AT BEDTIME AS NEEDED (FOR SLEEP.). Patient taking differently: Take 15 mg by mouth at bedtime.  04/20/15  Yes Ni Gorsuch, MD  ondansetron (ZOFRAN) 8 MG tablet TAKE 1 TABLET BY MOUTH EVERY 6 HOURS AS NEEDED FOR NAUSEA. 12/31/14  Yes [name], MD  predniSONE (DELTASONE) 20 MG tablet TAKE 1 TABLET BY MOUTH DAILY WITH BREAKFAST. 11/07/15  Yes [name], MD  ranitidine (ZANTAC) 150 MG tablet TAKE 1 TABLET (150 MG TOTAL) BY MOUTH EVERY EVENING. 11/07/15  Yes [name], MD  rivaroxaban (XARELTO) 20 MG TABS tablet Take 1 tablet (20 mg total) by mouth daily with supper. 10/04/15  Yes [name], MD    umeclidinium-vilanterol (ANORO ELLIPTA) 62.5-25 MCG/INH AEPB Inhale 1 puff into the lungs daily. 09/22/15  Yes Collene Gobble, MD  albuterol (PROVENTIL) (2.5 MG/3ML) 0.083% nebulizer solution Take 3 mLs (2.5 mg total) by nebulization every 4 (four) hours as needed for wheezing or shortness of breath. 10/28/15   Collene Gobble, MD  fluticasone (FLONASE) 50 MCG/ACT nasal spray Place 2 sprays into both nostrils daily. Patient not taking: Reported on 11/27/2015 11/25/14   Collene Gobble, MD  hydrochlorothiazide (HYDRODIURIL) 25 MG tablet TAKE 1 TABLET BY MOUTH EVERY DAY Patient not taking: Reported on 11/27/2015 06/21/15   [name], MD  LORazepam (ATIVAN) 0.5 MG tablet TAKE 1 TABLET BY MOUTH EVERY 8 HOURS AS NEEDED FOR ANXIETY OR FOR NAUSEA 12/27/14   [name], MD  potassium chloride SA (K-DUR,KLOR-CON) 20 MEQ tablet Take 1 tablet (20 mEq total) by mouth 2 (two) times daily. Patient not taking: Reported on 11/27/2015 10/12/15   Davonna Belling, MD  prochlorperazine (COMPAZINE) 10 MG tablet TAKE 1 TABLET BY MOUTH EVERY 6 (SIX) HOURS AS NEEDED FOR NAUSEA OR VOMITING. Patient not taking: Reported on 11/27/2015 06/16/15   [name], MD  Family History Family History  Problem Relation Age of Onset  . Heart disease Mother   . Heart disease Father   . Thyroid cancer Father     Social History Social History  Substance Use Topics  . Smoking status: Former Smoker    Packs/day: 1.00    Years: 30.00    Types: Cigarettes    Quit date: 01/29/2009  . Smokeless tobacco: Never Used  . Alcohol use 0.0 oz/week     Allergies   Codeine; Penicillins; and Sulfa antibiotics   Review of Systems Review of Systems  Constitutional: Positive for chills.  HENT: Positive for congestion.   Respiratory: Positive for cough and shortness of breath.   Musculoskeletal: Positive for myalgias (generalized).  All other systems reviewed and are negative.    Physical Exam Updated Vital Signs BP 136/98    Pulse 89   Temp 98.2 F (36.8 C) (Oral)   Resp 20   Ht '6\' 2"'$  (1.88 m)   Wt 104.3 kg   SpO2 90%   BMI 29.53 kg/m   Physical Exam  Constitutional: She is oriented to person, place, and time. She appears well-developed and well-nourished. No distress.  HENT:  Head: Normocephalic and atraumatic.  Right Ear: Tympanic membrane normal.  Left Ear: Tympanic membrane normal.  Nose: Nose normal. Right sinus exhibits no maxillary sinus tenderness and no frontal sinus tenderness. Left sinus exhibits no maxillary sinus tenderness and no frontal sinus tenderness.  Mouth/Throat: Uvula is midline, oropharynx is clear and moist and mucous membranes are normal. No oropharyngeal exudate, posterior oropharyngeal edema, posterior oropharyngeal erythema or tonsillar abscesses. No tonsillar exudate.  Eyes: Conjunctivae and EOM are normal. Right eye exhibits no discharge. Left eye exhibits no discharge. No scleral icterus.  Neck: Normal range of motion. Neck supple.  Cardiovascular: Normal rate, regular rhythm, normal heart sounds and intact distal pulses.   Pulmonary/Chest: Effort normal and breath sounds normal. No respiratory distress. She has no wheezes. She has no rales. She exhibits no tenderness.  Abdominal: Soft. Bowel sounds are normal. She exhibits no distension and no mass. There is no tenderness. There is no rebound and no guarding. No hernia.  Musculoskeletal: Normal range of motion. She exhibits no edema.  Lymphadenopathy:    She has no cervical adenopathy.  Neurological: She is alert and oriented to person, place, and time.  Skin: Skin is warm and dry. She is not diaphoretic.  Nursing note and vitals reviewed.    ED Treatments / Results  Labs (all labs ordered are listed, but only abnormal results are displayed) Labs Reviewed  CBC WITH DIFFERENTIAL/PLATELET - Abnormal; Notable for the following:       Result Value   WBC 17.6 (*)    RBC 2.60 (*)    Hemoglobin 8.1 (*)    HCT 27.9 (*)     MCV 107.3 (*)    MCHC 29.0 (*)    RDW 19.7 (*)    Platelets 149 (*)    Neutro Abs 14.9 (*)    Monocytes Absolute 1.3 (*)    All other components within normal limits  BASIC METABOLIC PANEL - Abnormal; Notable for the following:    Potassium 2.9 (*)    Glucose, Bld 151 (*)    Calcium 8.8 (*)    All other components within normal limits  Randolm Idol, ED    EKG  EKG Interpretation  Date/Time:  Sunday November 27 2015 14:54:15 EDT Ventricular Rate:  88 PR Interval:    QRS Duration:  101 QT Interval:  399 QTC Calculation: 461 R Axis:   63 Text Interpretation:  Sinus rhythm Atrial premature complexes Low voltage, precordial leads av block unknown grade Otherwise no significant change Confirmed by Tyrone Nine MD, DANIEL [phone]) on 11/27/2015 2:59:32 PM       Radiology Dg Chest 2 View  Result Date: 11/27/2015 CLINICAL DATA:  Shortness of breath. History of lung cancer and radiation therapy. EXAM: CHEST  2 VIEW COMPARISON:  10/12/2015 chest radiograph. FINDINGS: Right internal jugular MediPort terminates over the right atrium. Stable cardiomediastinal silhouette with normal heart size, aortic atherosclerosis and thickening of the right paratracheal mediastinal contour. No pneumothorax. No pleural effusion. Stable mild scarring versus atelectasis at the left lung base. Stable medial apical right lung mass with associated distortion and volume loss. Stable pulmonary nodule in the retrosternal space. No acute consolidative airspace disease. IMPRESSION: Stable chest radiograph. Stable medial apical right lung mass. Stable retrosternal space pulmonary nodule. Stable scarring versus atelectasis of the left lung base. No acute consolidative airspace disease. Electronically Signed   By: Ilona Sorrel M.D.   On: 11/27/2015 13:44    Procedures Procedures (including critical care time)  Medications Ordered in ED Medications  albuterol (PROVENTIL) (2.5 MG/3ML) 0.083% nebulizer solution 5 mg (5 mg  Nebulization Given 11/27/15 1333)  potassium chloride SA (K-DUR,KLOR-CON) CR tablet 40 mEq (40 mEq Oral Given 11/27/15 1517)  heparin lock flush 100 unit/mL (500 Units Intracatheter Given 11/27/15 1600)     Initial Impression / Assessment and Plan / ED Course  I have reviewed the triage vital signs and the nursing notes.  Pertinent labs & imaging results that were available during my care of the patient were reviewed by me and considered in my medical decision making (see chart for details).  Clinical Course   Patient presents with shortness of breath that started this morning. She reports feeling like mucus was checking her throat that she was unable to clear. History of lung cancer, notes her last chemotherapy treatment was on 10/19, and PE (currently on Xarelto). Denies fever, chest pain, hemoptysis. VSS, O2 95% on RA.  Exam unremarkable, lungs clear to auscultation bilaterally. Patient reports while she was on her way to radiology she coughed up a large amount of sputum and reports having significant relief of her shortness of breath. EKG shows sinus rhythm with AV grade, unknown grade, which appears new from prior, no acute ischemic changes. Trop negative. CXR showed no acute changes. K 2.9, pt given PO potassium supplement in the ED. Remaining labs consistent with pt's baseline values. Reevaluation patient reports her shortness of breath has significantly improved and she feels back at her baseline. Patient ambulated with pulse ox, nurse reports pt's O2 sat 95% on RA while ambulating. Patient has remained hemodynamically stable while in the ED. VSS. Plan to discharge patient home with some tonight treatment. Advised her to continue taking her home meds. Plan for patient to follow up with her oncologist this week for reevaluation and referral to cardiology regarding her EKG changes seen in the ED today. Discussed results and plan for discharge with patient. Discussed return precautions.      Final Clinical Impressions(s) / ED Diagnoses   Final diagnoses:  SOB (shortness of breath)    New Prescriptions Discharge Medication List as of 11/27/2015  3:38 PM       Nona Dell, PA-C 11/28/15 [place], DO 11/28/15 [phone]

## 2015-11-27 NOTE — ED Notes (Signed)
Patient placed on 2L oxygen for comfort measures.

## 2015-11-27 NOTE — ED Notes (Signed)
Patient transported to X-ray 

## 2015-11-27 NOTE — ED Notes (Signed)
Pt wants labs drawn from her port

## 2015-11-27 NOTE — ED Triage Notes (Signed)
PT C/O SOB THAT STARTED TODAY. PT STS SHE HAS HAD A DRY COUGH FOR A FEW DAYS, BUT IS UNABLE TO BRING UP THE PHLEGM OR CATCH HER BREATH. PT HAS A HX OF LUNG CA, BUT DOES NOT USE OXYGEN AT HOME. PT LAST CHEMO WAS 3 WEEKS AGO. PT STS SHE ALSO HAD A TEMP OF 100.7 AT HOME.

## 2015-11-27 NOTE — Discharge Instructions (Signed)
Continue taking your home medications as prescribed. I recommend continuing to drink fluids to remain hydrated at home. You may try drinking warm fluids to help with your mucus/congestion. I also recommend using your humidifier at home. Call your oncologist tomorrow to schedule a follow-up appointment for this week. I recommend discussing sitting up a referral to cardiologist for this week for follow-up and reevaluation regarding your abnormal EKG seen in the ED today. Please return to the Emergency Department if symptoms worsen or new onset of fever, headache, lightheadedness/dizziness, chest pain, difficulty breathing, coughing up blood, vomiting, unable to keep fluids down, abdominal pain, leg swelling, syncope, seizure.

## 2015-11-27 NOTE — ED Notes (Signed)
Made respiratory aware that patient has neb treatment ordered.

## 2015-11-28 ENCOUNTER — Ambulatory Visit: Payer: BLUE CROSS/BLUE SHIELD | Admitting: Emergency Medicine

## 2015-11-30 ENCOUNTER — Other Ambulatory Visit (HOSPITAL_BASED_OUTPATIENT_CLINIC_OR_DEPARTMENT_OTHER): Payer: Self-pay

## 2015-11-30 ENCOUNTER — Ambulatory Visit (HOSPITAL_BASED_OUTPATIENT_CLINIC_OR_DEPARTMENT_OTHER): Payer: Self-pay

## 2015-11-30 ENCOUNTER — Ambulatory Visit (HOSPITAL_BASED_OUTPATIENT_CLINIC_OR_DEPARTMENT_OTHER): Payer: Self-pay | Admitting: Hematology and Oncology

## 2015-11-30 ENCOUNTER — Encounter: Payer: Self-pay | Admitting: Hematology and Oncology

## 2015-11-30 ENCOUNTER — Ambulatory Visit: Payer: BLUE CROSS/BLUE SHIELD

## 2015-11-30 VITALS — BP 152/60 | HR 95 | Temp 98.0°F | Resp 18 | Ht 74.0 in | Wt 246.4 lb

## 2015-11-30 DIAGNOSIS — D61810 Antineoplastic chemotherapy induced pancytopenia: Secondary | ICD-10-CM

## 2015-11-30 DIAGNOSIS — G622 Polyneuropathy due to other toxic agents: Secondary | ICD-10-CM

## 2015-11-30 DIAGNOSIS — C3411 Malignant neoplasm of upper lobe, right bronchus or lung: Secondary | ICD-10-CM

## 2015-11-30 DIAGNOSIS — G620 Drug-induced polyneuropathy: Secondary | ICD-10-CM

## 2015-11-30 DIAGNOSIS — I2699 Other pulmonary embolism without acute cor pulmonale: Secondary | ICD-10-CM

## 2015-11-30 DIAGNOSIS — I10 Essential (primary) hypertension: Secondary | ICD-10-CM

## 2015-11-30 DIAGNOSIS — I4891 Unspecified atrial fibrillation: Secondary | ICD-10-CM

## 2015-11-30 DIAGNOSIS — T451X5A Adverse effect of antineoplastic and immunosuppressive drugs, initial encounter: Secondary | ICD-10-CM

## 2015-11-30 DIAGNOSIS — J449 Chronic obstructive pulmonary disease, unspecified: Secondary | ICD-10-CM

## 2015-11-30 DIAGNOSIS — D72829 Elevated white blood cell count, unspecified: Secondary | ICD-10-CM

## 2015-11-30 DIAGNOSIS — Z5111 Encounter for antineoplastic chemotherapy: Secondary | ICD-10-CM

## 2015-11-30 LAB — CBC WITH DIFFERENTIAL/PLATELET
BASO%: 0.3 % (ref 0.0–2.0)
BASOS ABS: 0 10*3/uL (ref 0.0–0.1)
EOS ABS: 0.1 10*3/uL (ref 0.0–0.5)
EOS%: 0.3 % (ref 0.0–7.0)
HEMATOCRIT: 28.3 % — AB (ref 34.8–46.6)
HGB: 8.6 g/dL — ABNORMAL LOW (ref 11.6–15.9)
LYMPH#: 0.9 10*3/uL (ref 0.9–3.3)
LYMPH%: 5.7 % — AB (ref 14.0–49.7)
MCH: 32.5 pg (ref 25.1–34.0)
MCHC: 30.4 g/dL — AB (ref 31.5–36.0)
MCV: 106.9 fL — AB (ref 79.5–101.0)
MONO#: 1.2 10*3/uL — AB (ref 0.1–0.9)
MONO%: 7.1 % (ref 0.0–14.0)
NEUT#: 14.1 10*3/uL — ABNORMAL HIGH (ref 1.5–6.5)
NEUT%: 86.6 % — AB (ref 38.4–76.8)
PLATELETS: 262 10*3/uL (ref 145–400)
RBC: 2.65 10*6/uL — AB (ref 3.70–5.45)
RDW: 22.2 % — ABNORMAL HIGH (ref 11.2–14.5)
WBC: 16.2 10*3/uL — ABNORMAL HIGH (ref 3.9–10.3)

## 2015-11-30 LAB — COMPREHENSIVE METABOLIC PANEL
ALBUMIN: 3.3 g/dL — AB (ref 3.5–5.0)
ALK PHOS: 134 U/L (ref 40–150)
ALT: 16 U/L (ref 0–55)
AST: 14 U/L (ref 5–34)
Anion Gap: 13 mEq/L — ABNORMAL HIGH (ref 3–11)
BILIRUBIN TOTAL: 0.34 mg/dL (ref 0.20–1.20)
BUN: 13 mg/dL (ref 7.0–26.0)
CALCIUM: 8.6 mg/dL (ref 8.4–10.4)
CO2: 26 mEq/L (ref 22–29)
Chloride: 106 mEq/L (ref 98–109)
Creatinine: 0.8 mg/dL (ref 0.6–1.1)
EGFR: 76 mL/min/{1.73_m2} — AB (ref >90)
Glucose: 130 mg/dl (ref 70–140)
POTASSIUM: 4 meq/L (ref 3.5–5.1)
Sodium: 145 mEq/L (ref 136–145)
Total Protein: 6.4 g/dL (ref 6.4–8.3)

## 2015-11-30 MED ORDER — HEPARIN SOD (PORK) LOCK FLUSH 100 UNIT/ML IV SOLN
500.0000 [IU] | Freq: Once | INTRAVENOUS | Status: AC | PRN
  Administered 2015-11-30: 500 [IU]
  Filled 2015-11-30: qty 5

## 2015-11-30 MED ORDER — PALONOSETRON HCL INJECTION 0.25 MG/5ML
0.2500 mg | Freq: Once | INTRAVENOUS | Status: AC
  Administered 2015-11-30: 0.25 mg via INTRAVENOUS

## 2015-11-30 MED ORDER — SODIUM CHLORIDE 0.9% FLUSH
10.0000 mL | INTRAVENOUS | Status: DC | PRN
  Administered 2015-11-30: 10 mL
  Filled 2015-11-30: qty 10

## 2015-11-30 MED ORDER — SODIUM CHLORIDE 0.9 % IV SOLN
700.0000 mg/m2 | Freq: Once | INTRAVENOUS | Status: AC
  Administered 2015-11-30: 1672 mg via INTRAVENOUS
  Filled 2015-11-30: qty 43.97

## 2015-11-30 MED ORDER — SODIUM CHLORIDE 0.9 % IV SOLN
INTRAVENOUS | Status: DC
  Administered 2015-11-30: 13:00:00 via INTRAVENOUS

## 2015-11-30 MED ORDER — SODIUM CHLORIDE 0.9 % IV SOLN
Freq: Once | INTRAVENOUS | Status: AC
  Administered 2015-11-30: 13:00:00 via INTRAVENOUS
  Filled 2015-11-30: qty 5

## 2015-11-30 MED ORDER — SODIUM CHLORIDE 0.9 % IV SOLN
17.0000 mg/m2 | Freq: Once | INTRAVENOUS | Status: AC
  Administered 2015-11-30: 40 mg via INTRAVENOUS
  Filled 2015-11-30: qty 3

## 2015-11-30 MED ORDER — IPRATROPIUM BROMIDE 0.02 % IN SOLN: 0.5000 mg | Freq: Four times a day (QID) | RESPIRATORY_TRACT | 12 refills | Status: DC | PRN

## 2015-11-30 MED ORDER — PALONOSETRON HCL INJECTION 0.25 MG/5ML
INTRAVENOUS | Status: AC
  Filled 2015-11-30: qty 5

## 2015-11-30 MED ORDER — SODIUM CHLORIDE 0.9 % IJ SOLN
10.0000 mL | INTRAMUSCULAR | Status: DC | PRN
  Administered 2015-11-30: 10 mL via INTRAVENOUS
  Filled 2015-11-30: qty 10

## 2015-11-30 NOTE — Assessment & Plan Note (Signed)
She is doing well without any signs of bleeding complication. She will continue anticoagulation therapy indefinitely.

## 2015-11-30 NOTE — Patient Instructions (Signed)
[facility] Discharge Instructions for Patients Receiving Chemotherapy  Today you received the following chemotherapy agents: Navelbine and Gemzar   To help prevent nausea and vomiting after your treatment, we encourage you to take your nausea medication as directed.    If you develop nausea and vomiting that is not controlled by your nausea medication, call the clinic.   BELOW ARE SYMPTOMS THAT SHOULD BE REPORTED IMMEDIATELY:  *FEVER GREATER THAN 100.5 F  *CHILLS WITH OR WITHOUT FEVER  NAUSEA AND VOMITING THAT IS NOT CONTROLLED WITH YOUR NAUSEA MEDICATION  *UNUSUAL SHORTNESS OF BREATH  *UNUSUAL BRUISING OR BLEEDING  TENDERNESS IN MOUTH AND THROAT WITH OR WITHOUT PRESENCE OF ULCERS  *URINARY PROBLEMS  *BOWEL PROBLEMS  UNUSUAL RASH Items with * indicate a potential emergency and should be followed up as soon as possible.  Feel free to call the clinic you have any questions or concerns. The clinic phone number is [phone].  Please show the [facility] at check-in to the Emergency Department and triage nurse.

## 2015-11-30 NOTE — Assessment & Plan Note (Signed)
She has recent transient atrial fibrillation. Examination today reveals sinus rhythm. She is on chronic anticoagulation therapy. I do not feel she need cardiology evaluation given the scope of her situation

## 2015-11-30 NOTE — Assessment & Plan Note (Signed)
She is having persistent symptoms of fatigue, shortness of breath, low-grade fever, peripheral neuropathy and productive cough. She denies hemoptysis. We discussed the goals of care and the patient wants to continue treatment without delay. I will see her back in 4 weeks for further assessment Her next CT scan would be due in November 2017 We will proceed with treatment today

## 2015-11-30 NOTE — Assessment & Plan Note (Signed)
She felt that albuterol causes significant palpitation. I recommend a trial of ipratropium nebulizer to treat her COPD

## 2015-11-30 NOTE — Assessment & Plan Note (Signed)
Her blood pressure fluctuated widely with therapy. Continue current treatment.

## 2015-11-30 NOTE — Progress Notes (Signed)
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      12/13/2013 Imaging    CT scan show large mass in the right lung apex likely representing primary lung carcinoma or large metastasis. Metastases demonstrated in the pretracheal lymph nodes, T1 vertebral body and possibly sacrum.      12/14/2013 Initial Diagnosis    Adenocarcinoma of upper lobe of right lung      12/14/2013 Pathology Results    Accession: [id number] biopsy from right upper lobe showed adenocarcinoma. [facility] testing was positive for several mutations without any known treatment options now      12/17/2013 Imaging    MRI of the head is negative.      12/23/2013 - 02/05/2014 Radiation Therapy    She received radiation therapy to the mediastinal mass and bone      12/28/2013 Imaging    PET/CT scan showed mediastinal mass, lymphadenopathy and T1 involvement.      01/01/2014 - 01/29/2014 Chemotherapy    Weekly carboplatin and Taxol were added      03/12/2014 Imaging    Repeat PET scan showed near complete response to treatment.      03/16/2014 - 05/28/2014 Chemotherapy    She started treatment with Alimta for maintenance      07/07/2014 Imaging    PET scan showed disease progression      07/15/2014 - 09/23/2014 Chemotherapy    She received palliative treatment with Opdivo      08/13/2014 - 08/25/2014 Radiation Therapy    She completed  palliative radiation therapy to the bone.      10/05/2014 Imaging    PET CT showed disease progression except for site of recent radiation therapy      10/13/2014 - 04/06/2015 Chemotherapy    She received Rx with weekly carbo/taxol, 2 weeks on 1 week off with Avastin every 3 weeks      12/15/2014 Imaging    PET scan showed positive response to Rx      01/18/2015 Imaging    Ct chest showed disease improvement      04/19/2015 Imaging    Ct chest diease progression      04/27/2015 -  Chemotherapy    Treatment is switched to Gemzar and Navelbine      05/04/2015 Adverse Reaction    Treatment was missed & interrupted due to recurrent UTI      06/13/2015 Imaging    MRI head is negative      08/17/2015 Imaging    CT imaging showed positive response to Rx      09/15/2015 - 09/17/2015 Hospital Admission    She was admitted to the hospital due to diagnosis of acute pulmonary embolism      09/15/2015 Imaging    Ct angiogram showed acute PE       INTERVAL HISTORY: Please see below for problem oriented  charting. She is seen today before chemotherapy. She was recently evaluated in the emergency department and was noted to have intermittent atrial fibrillation. She continues to have chronic bronchitis and cough. Albuterol nebulizer causes significant palpitation. The patient denies any recent signs or symptoms of bleeding such as spontaneous epistaxis, hematuria or hematochezia. She denies recent fever or chills. She has shortness of breath on moderate exertion. She denies dizziness or syncopal episode. Peripheral neuropathy is about the same.  REVIEW OF SYSTEMS:   Constitutional: Denies fevers, chills or abnormal weight loss Eyes: Denies blurriness of vision Ears, nose, mouth, throat, and face: Denies mucositis or sore throat Gastrointestinal:  Denies nausea, heartburn or change in bowel habits Skin: Denies abnormal skin rashes Lymphatics: Denies new lymphadenopathy or easy  bruising Neurological:Denies numbness, tingling or new weaknesses Behavioral/Psych: Mood is stable, no new changes  All other systems were reviewed with the patient and are negative.  I have reviewed the past medical history, past surgical history, social history and family history with the patient and they are unchanged from previous note.  ALLERGIES:  is allergic to codeine; penicillins; and sulfa antibiotics.  MEDICATIONS:  Current Outpatient Prescriptions  Medication Sig Dispense Refill  . albuterol (PROVENTIL HFA;VENTOLIN HFA) 108 (90 BASE) MCG/ACT inhaler Inhale 2 puffs into the lungs every 4 (four) hours as needed for wheezing or shortness of breath. 1 Inhaler 5  . albuterol (PROVENTIL) (2.5 MG/3ML) 0.083% nebulizer solution Take 3 mLs (2.5 mg total) by nebulization every 4 (four) hours as needed for wheezing or shortness of breath. 300 mL 2  . amLODipine (NORVASC) 10 MG tablet Take 1 tablet (10 mg total) by mouth at bedtime. (Patient taking differently: Take 10 mg by mouth daily as needed (high blood pressure). ) 90 tablet 2  . fluticasone (FLONASE) 50 MCG/ACT nasal spray Place 2 sprays into both nostrils daily. (Patient not taking: Reported on 11/27/2015) 16 g 5  . hydrochlorothiazide (HYDRODIURIL) 25 MG tablet TAKE 1 TABLET BY MOUTH EVERY DAY (Patient not taking: Reported on 11/27/2015) 30 tablet 1  . ipratropium (ATROVENT) 0.02 % nebulizer solution Take 2.5 mLs (0.5 mg total) by nebulization every 6 (six) hours as needed for wheezing or shortness of breath. 75 mL 12  . levothyroxine (SYNTHROID, LEVOTHROID) 200 MCG tablet TAKE 1 TABLET BY MOUTH DAILY BEFORE BREAKFAST. 90 tablet 0  . lisinopril (PRINIVIL) 10 MG tablet Take 1 tablet (10 mg total) by mouth at bedtime. (Patient taking differently: Take 10 mg by mouth at bedtime as needed (high blood pressure). ) 30 tablet 9  . LORazepam (ATIVAN) 0.5 MG tablet TAKE 1 TABLET BY MOUTH EVERY 8 HOURS AS NEEDED FOR ANXIETY OR FOR NAUSEA 90 tablet  0  . mirtazapine (REMERON) 15 MG tablet TAKE 1 TABLET (15 MG TOTAL) BY MOUTH AT BEDTIME AS NEEDED (FOR SLEEP.). (Patient taking differently: Take 15 mg by mouth at bedtime. ) 90 tablet 6  . ondansetron (ZOFRAN) 8 MG tablet TAKE 1 TABLET BY MOUTH EVERY 6 HOURS AS NEEDED FOR NAUSEA. 90 tablet 0  . potassium chloride SA (K-DUR,KLOR-CON) 20 MEQ tablet Take 1 tablet (20 mEq total) by mouth 2 (two) times daily. (Patient not taking: Reported on 11/27/2015) 6 tablet 0  . predniSONE (DELTASONE) 20 MG tablet TAKE 1 TABLET BY MOUTH DAILY WITH BREAKFAST. 30 tablet 0  . prochlorperazine (COMPAZINE) 10 MG tablet TAKE 1 TABLET BY MOUTH EVERY 6 (SIX) HOURS AS NEEDED FOR NAUSEA OR VOMITING. (Patient not taking: Reported on 11/27/2015) 30 tablet 1  .  ranitidine (ZANTAC) 150 MG tablet TAKE 1 TABLET (150 MG TOTAL) BY MOUTH EVERY EVENING. 90 tablet 2  . rivaroxaban (XARELTO) 20 MG TABS tablet Take 1 tablet (20 mg total) by mouth daily with supper. 30 tablet 9  . umeclidinium-vilanterol (ANORO ELLIPTA) 62.5-25 MCG/INH AEPB Inhale 1 puff into the lungs daily. 60 each 2   No current facility-administered medications for this visit.     PHYSICAL EXAMINATION: ECOG PERFORMANCE STATUS: 1 - Symptomatic but completely ambulatory  Vitals:   11/30/15 1108  BP: (!) 152/60  Pulse: 95  Resp: 18  Temp: 98 F (36.7 C)   Filed Weights   11/30/15 1108  Weight: 246 lb 6.4 oz (111.8 kg)    GENERAL:alert, no distress and comfortable SKIN: skin color, texture, turgor are normal, no rashes or significant lesions EYES: normal, Conjunctiva are pink and non-injected, sclera clear OROPHARYNX:no exudate, no erythema and lips, buccal mucosa, and tongue normal  NECK: supple, thyroid normal size, non-tender, without nodularity LYMPH:  no palpable lymphadenopathy in the cervical, axillary or inguinal LUNGS: clear to auscultation and percussion with normal breathing effort HEART: regular rate & rhythm and no murmurs and no lower  extremity edema ABDOMEN:abdomen soft, non-tender and normal bowel sounds Musculoskeletal:no cyanosis of digits and no clubbing  NEURO: alert & oriented x 3 with fluent speech, no focal motor/sensory deficits  LABORATORY DATA:  I have reviewed the data as listed    Component Value Date/Time   NA 145 11/30/2015 1151   K 4.0 11/30/2015 1151   CL 107 11/27/2015 1406   CO2 26 11/30/2015 1151   GLUCOSE 130 11/30/2015 1151   BUN 13.0 11/30/2015 1151   CREATININE 0.8 11/30/2015 1151   CALCIUM 8.6 11/30/2015 1151   PROT 6.4 11/30/2015 1151   ALBUMIN 3.3 (L) 11/30/2015 1151   AST 14 11/30/2015 1151   ALT 16 11/30/2015 1151   ALKPHOS 134 11/30/2015 1151   BILITOT 0.34 11/30/2015 1151   GFRNONAA >60 11/27/2015 1406   GFRAA >60 11/27/2015 1406    No results found for: SPEP, UPEP  Lab Results  Component Value Date   WBC 16.2 (H) 11/30/2015   NEUTROABS 14.1 (H) 11/30/2015   HGB 8.6 (L) 11/30/2015   HCT 28.3 (L) 11/30/2015   MCV 106.9 (H) 11/30/2015   PLT 262 11/30/2015      Chemistry      Component Value Date/Time   NA 145 11/30/2015 1151   K 4.0 11/30/2015 1151   CL 107 11/27/2015 1406   CO2 26 11/30/2015 1151   BUN 13.0 11/30/2015 1151   CREATININE 0.8 11/30/2015 1151      Component Value Date/Time   CALCIUM 8.6 11/30/2015 1151   ALKPHOS 134 11/30/2015 1151   AST 14 11/30/2015 1151   ALT 16 11/30/2015 1151   BILITOT 0.34 11/30/2015 1151      ASSESSMENT & PLAN:  Adenocarcinoma of upper lobe of right lung She is having persistent symptoms of fatigue, shortness of breath, low-grade fever, peripheral neuropathy and productive cough. She denies hemoptysis. We discussed the goals of care and the patient wants to continue treatment without delay. I will see her back in 4 weeks for further assessment Her next CT scan would be due in November 2017 We will proceed with treatment today  Pancytopenia due to antineoplastic chemotherapy [facility]) This is likely due to recent  treatment. The patient denies recent history of bleeding such as epistaxis, hematuria or hematochezia. She is asymptomatic from the anemia. I will observe  for now.  She does not require transfusion now. I will continue the chemotherapy at current dose without dosage adjustment.   Leukocytosis This is likely due to recent Neulasta injection & prednisone therapy. Observe only The patient does not feel well after recent Neulasta injection. Per patient request, I will omit Neulasta with next cycle of treatment.  Neuropathy due to chemotherapeutic drug [facility]) she has mild peripheral neuropathy, likely related to side effects of treatment. It is only mild, not bothering the patient. I will observe for now If it gets worse in the future, I will consider modifying the dose of the treatment   Essential hypertension Her blood pressure fluctuated widely with therapy. Continue current treatment.   Pulmonary embolism ([place]) She is doing well without any signs of bleeding complication. She will continue anticoagulation therapy indefinitely.  Atrial fibrillation, transient ([place]) She has recent transient atrial fibrillation. Examination today reveals sinus rhythm. She is on chronic anticoagulation therapy. I do not feel she need cardiology evaluation given the scope of her situation  COPD (chronic obstructive pulmonary disease) ([place]) She felt that albuterol causes significant palpitation. I recommend a trial of ipratropium nebulizer to treat her COPD   Orders Placed This Encounter  Procedures  . CT ABDOMEN PELVIS W CONTRAST    Standing Status:   Future    Standing Expiration Date:   01/03/2017    Order Specific Question:   Reason for exam:    Answer:   staging lung ca, assess response to Rx    Order Specific Question:   Preferred imaging location?    Answer:   [facility]  . CT CHEST W CONTRAST    Standing Status:   Future    Standing Expiration Date:   01/03/2017    Order Specific  Question:   Reason for exam:    Answer:   staging lung ca, assess response to Rx    Order Specific Question:   Preferred imaging location?    Answer:   [facility]  . CBC with Differential/Platelet    Standing Status:   Standing    Number of Occurrences:   9    Standing Expiration Date:   11/29/2016  . Comprehensive metabolic panel    Standing Status:   Standing    Number of Occurrences:   9    Standing Expiration Date:   11/29/2016  . Hold Tube, Blood Bank    Standing Status:   Standing    Number of Occurrences:   9    Standing Expiration Date:   11/29/2016   All questions were answered. The patient knows to call the clinic with any problems, questions or concerns. No barriers to learning was detected. I spent 30 minutes counseling the patient face to face. The total time spent in the appointment was 40 minutes and more than 50% was on counseling and review of test results     [name], MD 11/30/2015 6:24 PM

## 2015-11-30 NOTE — Assessment & Plan Note (Signed)
she has mild peripheral neuropathy, likely related to side effects of treatment. It is only mild, not bothering the patient. I will observe for now If it gets worse in the future, I will consider modifying the dose of the treatment  

## 2015-11-30 NOTE — Progress Notes (Signed)
Blood return noted before and after Navelbine administration.

## 2015-11-30 NOTE — Assessment & Plan Note (Signed)
This is likely due to recent Neulasta injection & prednisone therapy. Observe only The patient does not feel well after recent Neulasta injection. Per patient request, I will omit Neulasta with next cycle of treatment.

## 2015-11-30 NOTE — Assessment & Plan Note (Signed)
This is likely due to recent treatment. The patient denies recent history of bleeding such as epistaxis, hematuria or hematochezia. She is asymptomatic from the anemia. I will observe for now.  She does not require transfusion now. I will continue the chemotherapy at current dose without dosage adjustment.

## 2015-12-07 ENCOUNTER — Ambulatory Visit: Payer: BLUE CROSS/BLUE SHIELD

## 2015-12-07 ENCOUNTER — Ambulatory Visit (HOSPITAL_BASED_OUTPATIENT_CLINIC_OR_DEPARTMENT_OTHER): Payer: Self-pay

## 2015-12-07 ENCOUNTER — Telehealth: Payer: Self-pay | Admitting: *Deleted

## 2015-12-07 ENCOUNTER — Telehealth: Payer: Self-pay | Admitting: Hematology and Oncology

## 2015-12-07 ENCOUNTER — Other Ambulatory Visit: Payer: Self-pay | Admitting: Hematology and Oncology

## 2015-12-07 ENCOUNTER — Other Ambulatory Visit (HOSPITAL_BASED_OUTPATIENT_CLINIC_OR_DEPARTMENT_OTHER): Payer: Self-pay

## 2015-12-07 VITALS — BP 118/56 | HR 84 | Temp 98.9°F | Resp 18

## 2015-12-07 DIAGNOSIS — R300 Dysuria: Secondary | ICD-10-CM

## 2015-12-07 DIAGNOSIS — Z5111 Encounter for antineoplastic chemotherapy: Secondary | ICD-10-CM

## 2015-12-07 DIAGNOSIS — C3411 Malignant neoplasm of upper lobe, right bronchus or lung: Secondary | ICD-10-CM

## 2015-12-07 LAB — URINALYSIS, MICROSCOPIC - CHCC
BILIRUBIN (URINE): NEGATIVE
Glucose: NEGATIVE mg/dL
Ketones: NEGATIVE mg/dL
Nitrite: NEGATIVE
Protein: 100 mg/dL
SPECIFIC GRAVITY, URINE: 1.02 (ref 1.003–1.035)
Urobilinogen, UR: 0.2 mg/dL (ref 0.2–1)
pH: 6 (ref 4.6–8.0)

## 2015-12-07 LAB — COMPREHENSIVE METABOLIC PANEL
ALBUMIN: 3.2 g/dL — AB (ref 3.5–5.0)
ALK PHOS: 100 U/L (ref 40–150)
ALT: 28 U/L (ref 0–55)
ANION GAP: 11 meq/L (ref 3–11)
AST: 15 U/L (ref 5–34)
BILIRUBIN TOTAL: 0.34 mg/dL (ref 0.20–1.20)
BUN: 13.1 mg/dL (ref 7.0–26.0)
CALCIUM: 9.2 mg/dL (ref 8.4–10.4)
CO2: 25 mEq/L (ref 22–29)
Chloride: 106 mEq/L (ref 98–109)
Creatinine: 0.8 mg/dL (ref 0.6–1.1)
EGFR: 76 mL/min/{1.73_m2} — AB (ref >90)
GLUCOSE: 142 mg/dL — AB (ref 70–140)
Potassium: 3.9 mEq/L (ref 3.5–5.1)
Sodium: 141 mEq/L (ref 136–145)
TOTAL PROTEIN: 6.8 g/dL (ref 6.4–8.3)

## 2015-12-07 LAB — CBC WITH DIFFERENTIAL/PLATELET
BASO%: 0.4 % (ref 0.0–2.0)
BASOS ABS: 0 10*3/uL (ref 0.0–0.1)
EOS ABS: 0 10*3/uL (ref 0.0–0.5)
EOS%: 0.2 % (ref 0.0–7.0)
HCT: 28.8 % — ABNORMAL LOW (ref 34.8–46.6)
HEMOGLOBIN: 9 g/dL — AB (ref 11.6–15.9)
LYMPH%: 8.9 % — ABNORMAL LOW (ref 14.0–49.7)
MCH: 32.6 pg (ref 25.1–34.0)
MCHC: 31.3 g/dL — ABNORMAL LOW (ref 31.5–36.0)
MCV: 103.9 fL — AB (ref 79.5–101.0)
MONO#: 0.5 10*3/uL (ref 0.1–0.9)
MONO%: 5.6 % (ref 0.0–14.0)
NEUT%: 84.9 % — ABNORMAL HIGH (ref 38.4–76.8)
NEUTROS ABS: 7.6 10*3/uL — AB (ref 1.5–6.5)
PLATELETS: 163 10*3/uL (ref 145–400)
RBC: 2.77 10*6/uL — ABNORMAL LOW (ref 3.70–5.45)
RDW: 20 % — AB (ref 11.2–14.5)
WBC: 8.9 10*3/uL (ref 3.9–10.3)
lymph#: 0.8 10*3/uL — ABNORMAL LOW (ref 0.9–3.3)

## 2015-12-07 MED ORDER — SODIUM CHLORIDE 0.9 % IV SOLN
Freq: Once | INTRAVENOUS | Status: AC
  Administered 2015-12-07: 10:00:00 via INTRAVENOUS
  Filled 2015-12-07: qty 5

## 2015-12-07 MED ORDER — PALONOSETRON HCL INJECTION 0.25 MG/5ML
0.2500 mg | Freq: Once | INTRAVENOUS | Status: AC
  Administered 2015-12-07: 0.25 mg via INTRAVENOUS

## 2015-12-07 MED ORDER — SODIUM CHLORIDE 0.9 % IV SOLN
700.0000 mg/m2 | Freq: Once | INTRAVENOUS | Status: AC
  Administered 2015-12-07: 1672 mg via INTRAVENOUS
  Filled 2015-12-07: qty 43.97

## 2015-12-07 MED ORDER — SODIUM CHLORIDE 0.9 % IV SOLN
INTRAVENOUS | Status: DC
  Administered 2015-12-07: 10:00:00 via INTRAVENOUS

## 2015-12-07 MED ORDER — SODIUM CHLORIDE 0.9% FLUSH
10.0000 mL | INTRAVENOUS | Status: DC | PRN
  Administered 2015-12-07: 10 mL
  Filled 2015-12-07: qty 10

## 2015-12-07 MED ORDER — SODIUM CHLORIDE 0.9 % IJ SOLN
10.0000 mL | INTRAMUSCULAR | Status: DC | PRN
  Administered 2015-12-07: 10 mL via INTRAVENOUS
  Filled 2015-12-07: qty 10

## 2015-12-07 MED ORDER — HEPARIN SOD (PORK) LOCK FLUSH 100 UNIT/ML IV SOLN
500.0000 [IU] | Freq: Once | INTRAVENOUS | Status: AC | PRN
Start: 2015-12-07 — End: 2015-12-07
  Administered 2015-12-07: 500 [IU]
  Filled 2015-12-07: qty 5

## 2015-12-07 MED ORDER — SODIUM CHLORIDE 0.9 % IV SOLN
17.5000 mg/m2 | Freq: Once | INTRAVENOUS | Status: AC
  Administered 2015-12-07: 42 mg via INTRAVENOUS
  Filled 2015-12-07: qty 4.2

## 2015-12-07 MED ORDER — PALONOSETRON HCL INJECTION 0.25 MG/5ML
INTRAVENOUS | Status: AC
  Filled 2015-12-07: qty 5

## 2015-12-07 MED ORDER — CIPROFLOXACIN HCL 500 MG PO TABS: 500.0000 mg | ORAL_TABLET | Freq: Two times a day (BID) | ORAL | 0 refills | Status: DC

## 2015-12-07 NOTE — Telephone Encounter (Signed)
Informed pt of UTI and new order for Cipro sent to [facility].  Start tonight.  Drink plenty of fluids.  Call if symptoms worsen or do not resolve. She verbalized understanding.

## 2015-12-07 NOTE — Telephone Encounter (Signed)
-----   Message from [name], MD sent at 12/07/2015  9:44 AM EST ----- Regarding: Urine OK to proceed with chemo Call in cipro 500 mg BID PO x 7 days, no refills ----- Message ----- From: Interface, Lab In Three Zero One Sent: 12/07/2015   8:55 AM To: [name], MD

## 2015-12-07 NOTE — Telephone Encounter (Signed)
S/w pt in Flush room.  She says "I have a UTI"  C/o "it hurts when I pee."   Denies fevers or any other symptoms.  Urine collected for U/A.

## 2015-12-07 NOTE — Patient Instructions (Signed)

## 2015-12-07 NOTE — Patient Instructions (Signed)
[place] Discharge Instructions for Patients Receiving Chemotherapy  Today you received the following chemotherapy agents Navlbine/Gemzar  To help prevent nausea and vomiting after your treatment, we encourage you to take your nausea medication    If you develop nausea and vomiting that is not controlled by your nausea medication, call the clinic.   BELOW ARE SYMPTOMS THAT SHOULD BE REPORTED IMMEDIATELY:  *FEVER GREATER THAN 100.5 F  *CHILLS WITH OR WITHOUT FEVER  NAUSEA AND VOMITING THAT IS NOT CONTROLLED WITH YOUR NAUSEA MEDICATION  *UNUSUAL SHORTNESS OF BREATH  *UNUSUAL BRUISING OR BLEEDING  TENDERNESS IN MOUTH AND THROAT WITH OR WITHOUT PRESENCE OF ULCERS  *URINARY PROBLEMS  *BOWEL PROBLEMS  UNUSUAL RASH Items with * indicate a potential emergency and should be followed up as soon as possible.  Feel free to call the clinic you have any questions or concerns. The clinic phone number is [phone].  Please show the [place] at check-in to the Emergency Department and triage nurse.

## 2015-12-09 LAB — URINE CULTURE

## 2015-12-14 ENCOUNTER — Other Ambulatory Visit: Payer: Self-pay | Admitting: Hematology and Oncology

## 2015-12-14 ENCOUNTER — Telehealth: Payer: Self-pay | Admitting: General Practice

## 2015-12-14 NOTE — Telephone Encounter (Signed)
Spoke with pt confirmed 01/03/16 appt.

## 2015-12-15 ENCOUNTER — Telehealth: Payer: Self-pay | Admitting: *Deleted

## 2015-12-15 NOTE — Telephone Encounter (Signed)
Pt left VM states only sleeping about 3 hrs per night with taking Mirtazapine 15 mg tablets.  She asks if ok to increase to 1 1/2 tablets at HS?  Per Dr. Alvy Bimler, ok to increase to 30 mg.  Notified pt and she says she will try 1 1/2 first and if that does not help then will try taking 2 tablets.  Instructed pt to let us know if that helps , then we can send Rx for 30 mg tabs to her pharmacy.  Pt verbalized understanding, she will let us know.

## 2015-12-27 ENCOUNTER — Telehealth: Payer: Self-pay | Admitting: *Deleted

## 2015-12-27 NOTE — Telephone Encounter (Signed)
Pt Left VM reports swollen feet/toes/ankles and calves bilat for 3 to 4 days.  Says she is keeping feet elevated as much as possible. She is taking amlodipine and lisinopril for her BP but asks if she should take HCTZ?

## 2015-12-27 NOTE — Telephone Encounter (Signed)
Instructed pt to take 1/2 of 25 mg HCTZ daily for swelling. Also discussed prednisone 20 mg daily may be contributing to the swelling and Dr. Alvy Bimler also suggested decreasing dose prednisone to 10 mg per day. Keep legs elevated as much as possible. Monitor BP and take HCTZ in am and continue Amlodipine and Lisinopril in the evening if SBP over 120.  Pt verbalized understanding and says she has HCTZ, does not need refill.

## 2015-12-27 NOTE — Telephone Encounter (Signed)
She is prone to get dehydrated OK to take HCTZ but maybe 1/2 tab only

## 2016-01-03 ENCOUNTER — Ambulatory Visit: Payer: BLUE CROSS/BLUE SHIELD

## 2016-01-03 ENCOUNTER — Other Ambulatory Visit: Payer: BLUE CROSS/BLUE SHIELD

## 2016-01-03 ENCOUNTER — Encounter (HOSPITAL_COMMUNITY): Payer: Self-pay

## 2016-01-03 ENCOUNTER — Other Ambulatory Visit (HOSPITAL_BASED_OUTPATIENT_CLINIC_OR_DEPARTMENT_OTHER): Payer: Self-pay

## 2016-01-03 ENCOUNTER — Ambulatory Visit (HOSPITAL_COMMUNITY)
Admission: RE | Admit: 2016-01-03 | Discharge: 2016-01-03 | Disposition: A | Discharge status: Home / Self Care | Payer: BLUE CROSS/BLUE SHIELD | Source: Ambulatory Visit | Attending: Hematology and Oncology | Admitting: Hematology and Oncology

## 2016-01-03 ENCOUNTER — Telehealth: Payer: Self-pay | Admitting: *Deleted

## 2016-01-03 DIAGNOSIS — I700 Atherosclerosis of aorta: Secondary | ICD-10-CM | POA: Insufficient documentation

## 2016-01-03 DIAGNOSIS — C3411 Malignant neoplasm of upper lobe, right bronchus or lung: Secondary | ICD-10-CM | POA: Insufficient documentation

## 2016-01-03 DIAGNOSIS — Q619 Cystic kidney disease, unspecified: Secondary | ICD-10-CM | POA: Insufficient documentation

## 2016-01-03 DIAGNOSIS — R590 Localized enlarged lymph nodes: Secondary | ICD-10-CM | POA: Insufficient documentation

## 2016-01-03 LAB — COMPREHENSIVE METABOLIC PANEL
ALT: 12 U/L (ref 0–55)
AST: 12 U/L (ref 5–34)
Albumin: 3.4 g/dL — ABNORMAL LOW (ref 3.5–5.0)
Alkaline Phosphatase: 88 U/L (ref 40–150)
Anion Gap: 13 mEq/L — ABNORMAL HIGH (ref 3–11)
BUN: 12.9 mg/dL (ref 7.0–26.0)
CHLORIDE: 103 meq/L (ref 98–109)
CO2: 24 meq/L (ref 22–29)
Calcium: 9.5 mg/dL (ref 8.4–10.4)
Creatinine: 1 mg/dL (ref 0.6–1.1)
EGFR: 64 mL/min/{1.73_m2} — ABNORMAL LOW (ref >90)
GLUCOSE: 123 mg/dL (ref 70–140)
POTASSIUM: 3.8 meq/L (ref 3.5–5.1)
SODIUM: 140 meq/L (ref 136–145)
Total Bilirubin: 0.32 mg/dL (ref 0.20–1.20)
Total Protein: 7.6 g/dL (ref 6.4–8.3)

## 2016-01-03 LAB — CBC WITH DIFFERENTIAL/PLATELET
BASO%: 0.7 % (ref 0.0–2.0)
BASOS ABS: 0.1 10*3/uL (ref 0.0–0.1)
EOS%: 0.7 % (ref 0.0–7.0)
Eosinophils Absolute: 0.1 10*3/uL (ref 0.0–0.5)
HCT: 35.8 % (ref 34.8–46.6)
HEMOGLOBIN: 11 g/dL — AB (ref 11.6–15.9)
LYMPH%: 12.6 % — ABNORMAL LOW (ref 14.0–49.7)
MCH: 30.7 pg (ref 25.1–34.0)
MCHC: 30.7 g/dL — AB (ref 31.5–36.0)
MCV: 100.1 fL (ref 79.5–101.0)
MONO#: 1 10*3/uL — ABNORMAL HIGH (ref 0.1–0.9)
MONO%: 9.3 % (ref 0.0–14.0)
NEUT#: 8.2 10*3/uL — ABNORMAL HIGH (ref 1.5–6.5)
NEUT%: 76.7 % (ref 38.4–76.8)
Platelets: 259 10*3/uL (ref 145–400)
RBC: 3.58 10*6/uL — ABNORMAL LOW (ref 3.70–5.45)
RDW: 19.4 % — AB (ref 11.2–14.5)
WBC: 10.7 10*3/uL — ABNORMAL HIGH (ref 3.9–10.3)
lymph#: 1.4 10*3/uL (ref 0.9–3.3)

## 2016-01-03 MED ORDER — HEPARIN SOD (PORK) LOCK FLUSH 100 UNIT/ML IV SOLN
INTRAVENOUS | Status: AC
  Filled 2016-01-03: qty 5

## 2016-01-03 MED ORDER — IOPAMIDOL (ISOVUE-300) INJECTION 61%
100.0000 mL | Freq: Once | INTRAVENOUS | Status: AC | PRN
  Administered 2016-01-03: 100 mL via INTRAVENOUS

## 2016-01-03 MED ORDER — IOPAMIDOL (ISOVUE-300) INJECTION 61%
INTRAVENOUS | Status: AC
  Filled 2016-01-03: qty 100

## 2016-01-03 MED ORDER — SODIUM CHLORIDE 0.9 % IJ SOLN
INTRAMUSCULAR | Status: AC
  Filled 2016-01-03: qty 50

## 2016-01-03 NOTE — Progress Notes (Signed)
Pt did not come to infusion room. Labs were drawn in radiology.

## 2016-01-04 ENCOUNTER — Other Ambulatory Visit: Payer: Self-pay | Admitting: Hematology and Oncology

## 2016-01-04 ENCOUNTER — Telehealth: Payer: Self-pay | Admitting: Hematology and Oncology

## 2016-01-04 ENCOUNTER — Encounter: Payer: Self-pay | Admitting: Hematology and Oncology

## 2016-01-04 ENCOUNTER — Other Ambulatory Visit: Payer: Self-pay | Admitting: Emergency Medicine

## 2016-01-04 ENCOUNTER — Ambulatory Visit (HOSPITAL_BASED_OUTPATIENT_CLINIC_OR_DEPARTMENT_OTHER): Payer: Self-pay | Admitting: Hematology and Oncology

## 2016-01-04 ENCOUNTER — Ambulatory Visit: Payer: BLUE CROSS/BLUE SHIELD

## 2016-01-04 VITALS — BP 146/73 | HR 89 | Temp 98.1°F | Resp 18 | Ht 74.0 in | Wt 239.8 lb

## 2016-01-04 DIAGNOSIS — T451X5A Adverse effect of antineoplastic and immunosuppressive drugs, initial encounter: Secondary | ICD-10-CM

## 2016-01-04 DIAGNOSIS — C3411 Malignant neoplasm of upper lobe, right bronchus or lung: Secondary | ICD-10-CM

## 2016-01-04 DIAGNOSIS — I2699 Other pulmonary embolism without acute cor pulmonale: Secondary | ICD-10-CM

## 2016-01-04 DIAGNOSIS — R05 Cough: Secondary | ICD-10-CM

## 2016-01-04 DIAGNOSIS — R609 Edema, unspecified: Secondary | ICD-10-CM

## 2016-01-04 DIAGNOSIS — Z23 Encounter for immunization: Secondary | ICD-10-CM

## 2016-01-04 DIAGNOSIS — G4700 Insomnia, unspecified: Secondary | ICD-10-CM

## 2016-01-04 DIAGNOSIS — J449 Chronic obstructive pulmonary disease, unspecified: Secondary | ICD-10-CM

## 2016-01-04 DIAGNOSIS — Z66 Do not resuscitate: Secondary | ICD-10-CM

## 2016-01-04 DIAGNOSIS — G620 Drug-induced polyneuropathy: Secondary | ICD-10-CM

## 2016-01-04 DIAGNOSIS — Z7189 Other specified counseling: Secondary | ICD-10-CM

## 2016-01-04 MED ORDER — TEMAZEPAM 15 MG PO CAPS: 15.0000 mg | ORAL_CAPSULE | Freq: Every evening | ORAL | 0 refills | Status: DC | PRN

## 2016-01-04 MED ORDER — INFLUENZA VAC SPLIT QUAD 0.5 ML IM SUSY
0.5000 mL | PREFILLED_SYRINGE | Freq: Once | INTRAMUSCULAR | Status: AC
  Administered 2016-01-04: 0.5 mL via INTRAMUSCULAR
  Filled 2016-01-04: qty 0.5

## 2016-01-04 NOTE — Telephone Encounter (Signed)
Gave patient avs report and appointments for December and January  °

## 2016-01-04 NOTE — Progress Notes (Signed)
START OFF PATHWAY REGIMEN - Non-Small Cell Lung  Off Pathway: Docetaxel + Ramucirumab q21 Days  [id number]:Docetaxel + Ramucirumab q21 Days:   A cycle is every 21 days:     Ramucirumab (Cyramza (R)) 10 mg/kg in 250 mL NS IV over 60 minutes every 21 days on day 1, PRIOR TO DOCETAXEL. Not stable in D5W - dilute in NS only. Urine protein check recommended at baseline and periodically during therapy Dose Mod: None     Docetaxel (Taxotere(R)) 75 mg/m2 in 250 mL NS IV over 60 minutes every 21 days on day 1 Dose Mod: None Additional Orders: Premedicate with dexamethasone 8 mg PO BID for three days beginning 1 day prior to therapy.  **Always confirm dose/schedule in your pharmacy ordering system**    Patient Characteristics: Stage IV Metastatic, Non Squamous, Initial Chemotherapy/Immunotherapy, PS = 2 Check here if patient was staged using an edition prior to AJCC Staging - 8th Edition (i.e., prior to January 30, 2016)? false AJCC T Category: T4 Current Disease Status: Distant Metastases AJCC N Category: N3 AJCC M Category: M1c AJCC 8 Stage Grouping: IVB Histology: Non Squamous Cell ROS1 Rearrangement Status: Negative T790M Mutation Status: Not Applicable - EGFR Mutation Negative/Unknown Other Mutations/Biomarkers: No Other Actionable Mutations PD-L1 Expression Status: PD-L1 Negative Chemotherapy/Immunotherapy LOT: Initial Chemotherapy/Immunotherapy Molecular Targeted Therapy: Not Appropriate ALK Translocation Status: Negative Would you be surprised if this patient died  in the next year? I would be surprised if this patient died in the next year EGFR Mutation Status: Non-Sensitizing BRAF V600E Mutation Status: Negative Performance Status: PS = 2  Intent of Therapy: Non-Curative / Palliative Intent, Discussed with Patient

## 2016-01-04 NOTE — Assessment & Plan Note (Signed)
The patient has advanced directive and living will. She confirmed DO NOT RESUSCITATE status

## 2016-01-04 NOTE — Telephone Encounter (Signed)
Open by mistake

## 2016-01-04 NOTE — Assessment & Plan Note (Signed)
We discussed the goals of care. The patient has progressed to multiple lines of treatment and she is miserable with chronic persistent respiratory symptoms. Ultimately, after extensive discussion, the patient would like to continue on treatment. She is made aware that there are no good treatment if she progress on future treatment

## 2016-01-04 NOTE — Assessment & Plan Note (Signed)
She continues to have problem with insomnia. I recommend trial of temazepam. She has failed Remeron treatment

## 2016-01-04 NOTE — Assessment & Plan Note (Signed)
I reviewed CT imaging study with the patient. She has clear disease progression. I reviewed the current guidelines. She has exhausted multiple lines of treatment. There is one more choices of treatment available. The decision was made based on publication on Lancet. 2014 Aug 23;384(9944):665-73. doi: 10.1016/S0140-6736(14)60845-X. Epub 2014 Jun 2.  Ramucirumab plus docetaxel versus placebo plus docetaxel for second-line treatment of stage IV non-small-cell lung cancer after disease progression on platinum-based therapy (REVEL): a multicentre, double-blind, randomised phase 3 trial. Chrisandra Carota EB1, Ciuleanu TE2, Arrieta O3, Prabhash K4, Syrigos KN5, Goksel T6, Park K7, Gorbunova V8, Kowalyszyn RD9, Pikiel J10, Czyzewicz G11, Percell Belt CR13, Thomas M14, Bidoli P15, Dakhil S16, Gans S17, Kim JH18, Grigorescu A19, Karaseva N20, Reck M21, Cappuzzo F22, Alexandris E23, Sashegyi A24, Yurasov S23, Prol M25.  METHODS:  In this multicentre, double-blind, randomised phase 3 trial (REVEL), we enrolled patients with squamous or non-squamous NSCLC who had progressed during or after a first-line platinum-based chemotherapy regimen. Patients were randomly allocated (1:1) with a centralised, interactive voice-response system (stratified by sex, region, performance status, and previous maintenance therapy [yes vs no]) to receive docetaxel 75 mg/m(2) and either ramucirumab (10 mg/kg) or placebo on day 1 of a 21 day cycle until disease progression, unacceptable toxicity, withdrawal, or death. The primary endpoint was overall survival in all patients allocated to treatment. We assessed adverse events according to treatment received. This study is registered with [email or web address], number [id number]. FINDINGS:  Between Dec 31, 2008, and Feb 22, 2011, we screened 1825 patients, of whom 1253 patients were randomly allocated to treatment. Median overall survival was 105 months (IQR 51-212) for 628 patients allocated  ramucirumab plus docetaxel and 91 months (42-180) for 625 patients who received placebo plus docetaxel (hazard ratio 086, 95% CI 075-098; p=0023). Median progression-free survival was 45 months (IQR 23-83) for the ramucirumab group compared with 30 months (14-69) for the control group (076, 026-378; p<00001). We noted treatment-emergent adverse events in 613 (98%) of 627 patients in the ramucirumab safety population and 594 (95%) of 618 patients in the control safety population.   The most common grade 3 or worse adverse events were neutropenia (306 patients [49%] in the ramucirumab group vs 246 [40%] in the control group), febrile neutropenia (100 [16%] vs 62 [10%]), fatigue (88 [14%] vs 65 [10%]), leucopenia (86 [14%] vs 77 [12%]), and hypertension (35 [6%] vs 13 [2%]). The numbers of deaths from adverse events (31 [5%] vs 35 [6%]) and grade 3 or worse pulmonary haemorrhage (eight [1%] vs eight [1%]) did not differ between groups. Toxicities were manageable with appropriate dose reductions and supportive care. INTERPRETATION:  Ramucirumab plus docetaxel improves survival as second-line treatment of patients with stage IV NSCLC.  We discussed the role of treatment is of palliative intent  We discussed some of the risks, benefits, side-effects of Ramucirumab and Docetaxel  Some of the short term side-effects included, though not limited to, including weight loss, life threatening infections, risk of allergic reactions, need for transfusions of blood products, nausea, vomiting, mouth sores, change in bowel habits, loss of hair, admission to hospital for various reasons, and risks of death.   Long term side-effects are also discussed including risks of infertility, permanent damage to nerve function, hearing loss, chronic fatigue, kidney damage with possibility needing hemodialysis, and rare secondary malignancy including bone marrow disorders.  The patient is aware that the response  rates discussed earlier is not guaranteed.  After a long discussion, patient made an informed decision to proceed with the prescribed  plan of care.   Patient education material was dispensed. Due to severe peripheral neuropathy, I plan to start docetaxel at 20% dose adjustment. Due to history of recurrent neutropenic fever, I will cover her with G-CSF support. I plan to see her back prior to cycle 2 of therapy

## 2016-01-04 NOTE — Progress Notes (Signed)
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      12/13/2013 Imaging    CT scan show large mass in the right lung apex likely representing primary lung carcinoma or large metastasis. Metastases demonstrated in the pretracheal lymph nodes, T1 vertebral body and possibly sacrum.      12/14/2013 Initial Diagnosis    Adenocarcinoma of upper lobe of right lung      12/14/2013 Pathology Results    Accession: [id number] biopsy from right upper lobe showed adenocarcinoma. [facility] testing was positive for several mutations without any known treatment options now      12/17/2013 Imaging    MRI of the head is negative.      12/23/2013 - 02/05/2014 Radiation Therapy    She received radiation therapy to the mediastinal mass and bone      12/28/2013 Imaging    PET/CT scan showed mediastinal mass, lymphadenopathy and T1 involvement.      01/01/2014 - 01/29/2014 Chemotherapy    Weekly carboplatin and Taxol were added      03/12/2014 Imaging    Repeat PET scan showed near complete response to treatment.      03/16/2014 - 05/28/2014 Chemotherapy    She started treatment with Alimta for maintenance      07/07/2014 Imaging    PET scan showed disease progression      07/15/2014 - 09/23/2014 Chemotherapy    She received palliative treatment with Opdivo      08/13/2014 - 08/25/2014 Radiation Therapy    She completed  palliative radiation therapy to the bone.      10/05/2014 Imaging    PET CT showed disease progression except for site of recent radiation therapy      10/13/2014 - 04/06/2015 Chemotherapy    She received Rx with weekly carbo/taxol, 2 weeks on 1 week off with Avastin every 3 weeks      12/15/2014 Imaging    PET scan showed positive response to Rx      01/18/2015 Imaging    Ct chest showed disease improvement      04/19/2015 Imaging    Ct chest diease progression      04/27/2015 - 12/07/2015 Chemotherapy    Treatment is switched to Gemzar and Navelbine      05/04/2015 Adverse Reaction    Treatment was missed & interrupted due to recurrent UTI      06/13/2015 Imaging    MRI head is negative      08/17/2015 Imaging    CT imaging showed positive response to Rx      09/15/2015 - 09/17/2015 Hospital Admission    She was admitted to the hospital due to diagnosis of acute pulmonary embolism      09/15/2015 Imaging    Ct angiogram showed acute PE      01/03/2016 Imaging    Interval progression of bilateral  pulmonary nodules suggesting metastatic progression. 2. Subcarinal lymphadenopathy seen previously has decreased in the interval. 3. Thoracoabdominal aortic atherosclerosis. 4. Stable sclerotic changes in C7 and T1, potentially from prior radiation treatment although prior metastatic disease is a consideration.       INTERVAL HISTORY: Please see below for problem oriented charting. She returns for further follow-up. She continues to have chronic cough. She has intermittent leg swelling. She has persistent peripheral neuropathy. The patient denies any recent signs or symptoms of bleeding such as spontaneous epistaxis, hematuria or hematochezia.  REVIEW OF SYSTEMS:   Constitutional: Denies fevers, chills or abnormal weight loss Eyes: Denies blurriness of vision Ears, nose, mouth, throat, and face: Denies mucositis or sore throat Cardiovascular: Denies palpitation, chest  discomfort  Gastrointestinal:  Denies nausea, heartburn or change in bowel habits Skin: Denies abnormal skin rashes Lymphatics: Denies new lymphadenopathy or easy bruising Neurological:Denies numbness, tingling or new weaknesses Behavioral/Psych: Mood is stable, no new changes  All other systems were reviewed with the patient and are negative.  I have reviewed the past medical history, past surgical history, social history and family history with the patient and they are unchanged from previous note.  ALLERGIES:  is allergic to codeine; penicillins; and sulfa antibiotics.  MEDICATIONS:  Current Outpatient Prescriptions  Medication Sig Dispense Refill  . albuterol (PROVENTIL HFA;VENTOLIN HFA) 108 (90 BASE) MCG/ACT inhaler Inhale 2 puffs into the lungs every 4 (four) hours as needed for wheezing or shortness of breath. 1 Inhaler 5  . albuterol (PROVENTIL) (2.5 MG/3ML) 0.083% nebulizer solution Take 3 mLs (2.5 mg total) by nebulization every 4 (four) hours as needed for wheezing or shortness of breath. 300 mL 2  . amLODipine (NORVASC) 10 MG tablet Take 1 tablet (10 mg total) by mouth at bedtime. (Patient taking differently: Take 10 mg by mouth daily as needed (high blood pressure). ) 90 tablet 2  . hydrochlorothiazide (HYDRODIURIL) 25 MG tablet TAKE 1 TABLET BY MOUTH EVERY DAY 30 tablet 1  . ipratropium (ATROVENT) 0.02 % nebulizer solution Take 2.5 mLs (0.5 mg total) by nebulization every 6 (six) hours as needed for wheezing or shortness of breath. 75 mL 12  . levothyroxine (SYNTHROID, LEVOTHROID) 200 MCG tablet TAKE 1 TABLET BY MOUTH DAILY BEFORE BREAKFAST. 90 tablet 0  . lisinopril (PRINIVIL) 10 MG tablet Take 1 tablet (10 mg total) by mouth at bedtime. (Patient taking differently: Take 10 mg by mouth at bedtime as needed (high blood pressure). ) 30 tablet 9  . LORazepam (ATIVAN) 0.5 MG tablet TAKE 1 TABLET BY MOUTH EVERY 8 HOURS AS NEEDED FOR ANXIETY OR FOR NAUSEA 90 tablet 0  . mirtazapine  (REMERON) 15 MG tablet TAKE 1 TABLET (15 MG TOTAL) BY MOUTH AT BEDTIME AS NEEDED (FOR SLEEP.). (Patient taking differently: Take 15 mg by mouth at bedtime. ) 90 tablet 6  . ranitidine (ZANTAC) 150 MG tablet TAKE 1 TABLET (150 MG TOTAL) BY MOUTH EVERY EVENING. 90 tablet 2  . rivaroxaban (XARELTO) 20 MG TABS tablet Take 1 tablet (20 mg total) by mouth daily with supper. 30 tablet 9  . umeclidinium-vilanterol (ANORO ELLIPTA) 62.5-25 MCG/INH AEPB Inhale 1 puff into the lungs daily. 60 each 2  . ondansetron (ZOFRAN) 8 MG tablet TAKE 1 TABLET BY MOUTH EVERY 6 HOURS AS NEEDED FOR NAUSEA. (Patient not taking: Reported on 01/04/2016) 90 tablet 0  . predniSONE (DELTASONE) 20 MG tablet TAKE 1 TABLET BY MOUTH DAILY WITH BREAKFAST. (Patient not taking: Reported on 01/04/2016) 30 tablet 0  .  prochlorperazine (COMPAZINE) 10 MG tablet TAKE 1 TABLET BY MOUTH EVERY 6 (SIX) HOURS AS NEEDED FOR NAUSEA OR VOMITING. (Patient not taking: Reported on 01/04/2016) 30 tablet 1  . temazepam (RESTORIL) 15 MG capsule Take 1 capsule (15 mg total) by mouth at bedtime as needed for sleep. 30 capsule 0   No current facility-administered medications for this visit.     PHYSICAL EXAMINATION: ECOG PERFORMANCE STATUS: 2 - Symptomatic, <50% confined to bed  Vitals:   01/04/16 1028  BP: (!) 146/73  Pulse: 89  Resp: 18  Temp: 98.1 F (36.7 C)   Filed Weights   01/04/16 1028  Weight: 239 lb 12.8 oz (108.8 kg)    GENERAL:alert, no distress and comfortable. She is mildly cushingoid SKIN: skin color, texture, turgor are normal, no rashes or significant lesions EYES: normal, Conjunctiva are pink and non-injected, sclera clear OROPHARYNX:no exudate, no erythema and lips, buccal mucosa, and tongue normal  NECK: supple, thyroid normal size, non-tender, without nodularity LYMPH:  no palpable lymphadenopathy in the cervical, axillary or inguinal LUNGS: clear to auscultation and percussion with normal breathing effort HEART: regular  rate & rhythm and no murmurs with mild bilateral lower extremity edema ABDOMEN:abdomen soft, non-tender and normal bowel sounds Musculoskeletal:no cyanosis of digits and no clubbing  NEURO: alert & oriented x 3 with fluent speech, no focal motor/sensory deficits  LABORATORY DATA:  I have reviewed the data as listed    Component Value Date/Time   NA 140 01/03/2016 1107   K 3.8 01/03/2016 1107   CL 107 11/27/2015 1406   CO2 24 01/03/2016 1107   GLUCOSE 123 01/03/2016 1107   BUN 12.9 01/03/2016 1107   CREATININE 1.0 01/03/2016 1107   CALCIUM 9.5 01/03/2016 1107   PROT 7.6 01/03/2016 1107   ALBUMIN 3.4 (L) 01/03/2016 1107   AST 12 01/03/2016 1107   ALT 12 01/03/2016 1107   ALKPHOS 88 01/03/2016 1107   BILITOT 0.32 01/03/2016 1107   GFRNONAA >60 11/27/2015 1406   GFRAA >60 11/27/2015 1406    No results found for: SPEP, UPEP  Lab Results  Component Value Date   WBC 10.7 (H) 01/03/2016   NEUTROABS 8.2 (H) 01/03/2016   HGB 11.0 (L) 01/03/2016   HCT 35.8 01/03/2016   MCV 100.1 01/03/2016   PLT 259 01/03/2016      Chemistry      Component Value Date/Time   NA 140 01/03/2016 1107   K 3.8 01/03/2016 1107   CL 107 11/27/2015 1406   CO2 24 01/03/2016 1107   BUN 12.9 01/03/2016 1107   CREATININE 1.0 01/03/2016 1107      Component Value Date/Time   CALCIUM 9.5 01/03/2016 1107   ALKPHOS 88 01/03/2016 1107   AST 12 01/03/2016 1107   ALT 12 01/03/2016 1107   BILITOT 0.32 01/03/2016 1107       RADIOGRAPHIC STUDIES: I have personally reviewed the radiological images as listed and agreed with the findings in the report. Ct Chest W Contrast  Result Date: 01/03/2016 CLINICAL DATA:  Lung cancer. EXAM: CT CHEST, ABDOMEN, AND PELVIS WITH CONTRAST TECHNIQUE: Multidetector CT imaging of the chest, abdomen and pelvis was performed following the standard protocol during bolus administration of intravenous contrast. CONTRAST:  144m ISOVUE-300 IOPAMIDOL (ISOVUE-300) INJECTION 61%  COMPARISON:  Chest CT 09/15/2015. Chest abdomen and pelvis CT 08/17/2015. FINDINGS: CT CHEST FINDINGS Cardiovascular: The heart size is normal. Trace pericardial fluid or thickening noted. Atherosclerotic calcification is noted in the wall of the thoracic aorta. No  thoracic aortic aneurysm. Mediastinum/Nodes: 19 mm short axis subcarinal lymph node measured on the previous study is now 15 mm. No left hilar lymphadenopathy. 6 mm short axis prevascular lymph node on image 22 is stable. No axillary lymphadenopathy. Right Port-A-Cath tip is positioned in the mid to lower right atrium. Lungs/Pleura: Right apical lung consolidation, likely radiation fibrosis is stable. 9 mm right apical nodule is stable. 10 mm right upper lung nodule seen on image 38 series 5 was only 5 mm previously. 8 x 10 mm nodule measured previously in the left upper lobe (image 57 series 5) is now 10 x 13 mm. 7 mm left lower lobe nodule on image 59 has progressed in the interval. 7 mm posterior left lower lobe pulmonary nodule measured on the previous study now measures 14 mm on image 87. Other bilateral pulmonary nodules have also progressed similarly in the interval. No focal airspace consolidation. No pulmonary edema or pleural effusion. Musculoskeletal: Lytic and sclerotic changes in the vertebral bodies of the cervical thoracic junction appears stable. Stable sclerosis posterior right rib with associated fracture nonunion. CT ABDOMEN PELVIS FINDINGS Hepatobiliary: Scattered hypo attenuating lesions in the liver parenchyma are stable and likely represent cysts. There is no evidence for gallstones, gallbladder wall thickening, or pericholecystic fluid. No intrahepatic or extrahepatic biliary dilation. Pancreas: No focal mass lesion. No dilatation of the main duct. No intraparenchymal cyst. No peripancreatic edema. Spleen: No splenomegaly. No focal mass lesion. Adrenals/Urinary Tract: No adrenal nodule or mass. Right kidney unremarkable. 15 mm  exophytic cyst lower pole left kidney is stable. Second 13 mm exophytic lesion lower pole left kidney has attenuation too high to allow classification as a simple cyst. This measured about 10 mm on 12/13/2013 in may be a cyst has complicated by proteinaceous debris or hemorrhage. Attention on follow-up recommended. No evidence for hydroureter. The urinary bladder appears normal for the degree of distention. Stomach/Bowel: Stomach is nondistended. No gastric wall thickening. No evidence of outlet obstruction. Large duodenal diverticulum evident. No small bowel wall thickening. No small bowel dilatation. The terminal ileum is normal. The appendix is normal. No gross colonic mass. No colonic wall thickening. No substantial diverticular change. 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 There is abdominal aortic atherosclerosis without aneurysm. There is no gastrohepatic or hepatoduodenal ligament lymphadenopathy. No intraperitoneal or retroperitoneal lymphadenopathy. No pelvic sidewall lymphadenopathy. Reproductive: The uterus has normal CT imaging appearance. There is no adnexal mass. Other: No intraperitoneal free fluid. Musculoskeletal: Degenerative changes noted right hip. Bone windows reveal no worrisome lytic or sclerotic osseous lesions. IMPRESSION: 1. Interval progression of bilateral pulmonary nodules suggesting metastatic progression. 2. Subcarinal lymphadenopathy seen previously has decreased in the interval. 3. Thoracoabdominal aortic atherosclerosis. 4. Stable sclerotic changes in C7 and T1, potentially from prior radiation treatment although prior metastatic disease is a consideration. 5. No substantial change in 2 exophytic cystic lesions lower pole left kidney. Given hyper attenuation in the smaller of these 2 , attention on follow-up recommended. Electronically Signed   By: Misty Stanley M.D.   On: 01/03/2016 18:22   Ct Abdomen Pelvis W Contrast  Result Date: 01/03/2016 CLINICAL  DATA:  Lung cancer. EXAM: CT CHEST, ABDOMEN, AND PELVIS WITH CONTRAST TECHNIQUE: Multidetector CT imaging of the chest, abdomen and pelvis was performed following the standard protocol during bolus administration of intravenous contrast. CONTRAST:  164m ISOVUE-300 IOPAMIDOL (ISOVUE-300) INJECTION 61% COMPARISON:  Chest CT 09/15/2015. Chest abdomen and pelvis CT 08/17/2015. FINDINGS: CT CHEST FINDINGS Cardiovascular: The heart size is  normal. Trace pericardial fluid or thickening noted. Atherosclerotic calcification is noted in the wall of the thoracic aorta. No thoracic aortic aneurysm. Mediastinum/Nodes: 19 mm short axis subcarinal lymph node measured on the previous study is now 15 mm. No left hilar lymphadenopathy. 6 mm short axis prevascular lymph node on image 22 is stable. No axillary lymphadenopathy. Right Port-A-Cath tip is positioned in the mid to lower right atrium. Lungs/Pleura: Right apical lung consolidation, likely radiation fibrosis is stable. 9 mm right apical nodule is stable. 10 mm right upper lung nodule seen on image 38 series 5 was only 5 mm previously. 8 x 10 mm nodule measured previously in the left upper lobe (image 57 series 5) is now 10 x 13 mm. 7 mm left lower lobe nodule on image 59 has progressed in the interval. 7 mm posterior left lower lobe pulmonary nodule measured on the previous study now measures 14 mm on image 87. Other bilateral pulmonary nodules have also progressed similarly in the interval. No focal airspace consolidation. No pulmonary edema or pleural effusion. Musculoskeletal: Lytic and sclerotic changes in the vertebral bodies of the cervical thoracic junction appears stable. Stable sclerosis posterior right rib with associated fracture nonunion. CT ABDOMEN PELVIS FINDINGS Hepatobiliary: Scattered hypo attenuating lesions in the liver parenchyma are stable and likely represent cysts. There is no evidence for gallstones, gallbladder wall thickening, or pericholecystic  fluid. No intrahepatic or extrahepatic biliary dilation. Pancreas: No focal mass lesion. No dilatation of the main duct. No intraparenchymal cyst. No peripancreatic edema. Spleen: No splenomegaly. No focal mass lesion. Adrenals/Urinary Tract: No adrenal nodule or mass. Right kidney unremarkable. 15 mm exophytic cyst lower pole left kidney is stable. Second 13 mm exophytic lesion lower pole left kidney has attenuation too high to allow classification as a simple cyst. This measured about 10 mm on 12/13/2013 in may be a cyst has complicated by proteinaceous debris or hemorrhage. Attention on follow-up recommended. No evidence for hydroureter. The urinary bladder appears normal for the degree of distention. Stomach/Bowel: Stomach is nondistended. No gastric wall thickening. No evidence of outlet obstruction. Large duodenal diverticulum evident. No small bowel wall thickening. No small bowel dilatation. The terminal ileum is normal. The appendix is normal. No gross colonic mass. No colonic wall thickening. No substantial diverticular change. 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 There is abdominal aortic atherosclerosis without aneurysm. There is no gastrohepatic or hepatoduodenal ligament lymphadenopathy. No intraperitoneal or retroperitoneal lymphadenopathy. No pelvic sidewall lymphadenopathy. Reproductive: The uterus has normal CT imaging appearance. There is no adnexal mass. Other: No intraperitoneal free fluid. Musculoskeletal: Degenerative changes noted right hip. Bone windows reveal no worrisome lytic or sclerotic osseous lesions. IMPRESSION: 1. Interval progression of bilateral pulmonary nodules suggesting metastatic progression. 2. Subcarinal lymphadenopathy seen previously has decreased in the interval. 3. Thoracoabdominal aortic atherosclerosis. 4. Stable sclerotic changes in C7 and T1, potentially from prior radiation treatment although prior metastatic disease is a consideration. 5.  No substantial change in 2 exophytic cystic lesions lower pole left kidney. Given hyper attenuation in the smaller of these 2 , attention on follow-up recommended. Electronically Signed   By: Misty Stanley M.D.   On: 01/03/2016 18:22     ASSESSMENT & PLAN:  Adenocarcinoma of upper lobe of right lung I reviewed CT imaging study with the patient. She has clear disease progression. I reviewed the current guidelines. She has exhausted multiple lines of treatment. There is one more choices of treatment available. The decision was made based on publication  on Lancet. 2014 Aug 23;384(9944):665-73. doi: 10.1016/S0140-6736(14)60845-X. Epub 2014 Jun 2.  Ramucirumab plus docetaxel versus placebo plus docetaxel for second-line treatment of stage IV non-small-cell lung cancer after disease progression on platinum-based therapy (REVEL): a multicentre, double-blind, randomised phase 3 trial. Chrisandra Carota EB1, Ciuleanu TE2, Arrieta O3, Prabhash K4, Syrigos KN5, Goksel T6, Park K7, Gorbunova V8, Kowalyszyn RD9, Pikiel J10, Czyzewicz G11, Percell Belt CR13, Thomas M14, Bidoli P15, Dakhil S16, Gans S17, Kim JH18, Grigorescu A19, Karaseva N20, Reck M21, Cappuzzo F22, Alexandris E23, Sashegyi A24, Yurasov S23, Prol M25.  METHODS:  In this multicentre, double-blind, randomised phase 3 trial (REVEL), we enrolled patients with squamous or non-squamous NSCLC who had progressed during or after a first-line platinum-based chemotherapy regimen. Patients were randomly allocated (1:1) with a centralised, interactive voice-response system (stratified by sex, region, performance status, and previous maintenance therapy [yes vs no]) to receive docetaxel 75 mg/m(2) and either ramucirumab (10 mg/kg) or placebo on day 1 of a 21 day cycle until disease progression, unacceptable toxicity, withdrawal, or death. The primary endpoint was overall survival in all patients allocated to treatment. We assessed adverse events according to  treatment received. This study is registered with [email or web address], number [id number]. FINDINGS:  Between Dec 31, 2008, and Feb 22, 2011, we screened 1825 patients, of whom 1253 patients were randomly allocated to treatment. Median overall survival was 105 months (IQR 51-212) for 628 patients allocated ramucirumab plus docetaxel and 91 months (42-180) for 625 patients who received placebo plus docetaxel (hazard ratio 086, 95% CI 075-098; p=0023). Median progression-free survival was 45 months (IQR 23-83) for the ramucirumab group compared with 30 months (14-69) for the control group (076, 782-956; p<00001). We noted treatment-emergent adverse events in 613 (98%) of 627 patients in the ramucirumab safety population and 594 (95%) of 618 patients in the control safety population.   The most common grade 3 or worse adverse events were neutropenia (306 patients [49%] in the ramucirumab group vs 246 [40%] in the control group), febrile neutropenia (100 [16%] vs 62 [10%]), fatigue (88 [14%] vs 65 [10%]), leucopenia (86 [14%] vs 77 [12%]), and hypertension (35 [6%] vs 13 [2%]). The numbers of deaths from adverse events (31 [5%] vs 35 [6%]) and grade 3 or worse pulmonary haemorrhage (eight [1%] vs eight [1%]) did not differ between groups. Toxicities were manageable with appropriate dose reductions and supportive care. INTERPRETATION:  Ramucirumab plus docetaxel improves survival as second-line treatment of patients with stage IV NSCLC.  We discussed the role of treatment is of palliative intent  We discussed some of the risks, benefits, side-effects of Ramucirumab and Docetaxel  Some of the short term side-effects included, though not limited to, including weight loss, life threatening infections, risk of allergic reactions, need for transfusions of blood products, nausea, vomiting, mouth sores, change in bowel habits, loss of hair, admission to hospital for various reasons, and risks of  death.   Long term side-effects are also discussed including risks of infertility, permanent damage to nerve function, hearing loss, chronic fatigue, kidney damage with possibility needing hemodialysis, and rare secondary malignancy including bone marrow disorders.  The patient is aware that the response rates discussed earlier is not guaranteed.  After a long discussion, patient made an informed decision to proceed with the prescribed plan of care.   Patient education material was dispensed. Due to severe peripheral neuropathy, I plan to start docetaxel at 20% dose adjustment. Due to history of recurrent neutropenic fever, I will cover her with G-CSF support. I plan  to see her back prior to cycle 2 of therapy   Neuropathy due to chemotherapeutic drug [facility]) She continues to have persistent peripheral neuropathy. She declined narcotic prescription for this. I will reduce the dose of chemotherapy as above  Goals of care, counseling/discussion We discussed the goals of care. The patient has progressed to multiple lines of treatment and she is miserable with chronic persistent respiratory symptoms. Ultimately, after extensive discussion, the patient would like to continue on treatment. She is made aware that there are no good treatment if she progress on future treatment  DNR (do not resuscitate) The patient has advanced directive and living will. She confirmed DO NOT RESUSCITATE status  COPD (chronic obstructive pulmonary disease) ([place]) She felt that albuterol causes significant palpitation. I recommend a trial of ipratropium nebulizer to treat her COPD   Insomnia She continues to have problem with insomnia. I recommend trial of temazepam. She has failed Remeron treatment  Pulmonary embolus ([place]) She is doing well without any signs of bleeding complication. She will continue anticoagulation therapy indefinitely.   Orders Placed This Encounter  Procedures  . CBC with  Differential    Standing Status:   Standing    Number of Occurrences:   20    Standing Expiration Date:   01/04/2017  . Comprehensive metabolic panel    Standing Status:   Standing    Number of Occurrences:   20    Standing Expiration Date:   01/04/2017   All questions were answered. The patient knows to call the clinic with any problems, questions or concerns. No barriers to learning was detected. I spent 40 minutes counseling the patient face to face. The total time spent in the appointment was 60 minutes and more than 50% was on counseling and review of test results     [name], MD 01/04/2016 1:25 PM

## 2016-01-04 NOTE — Assessment & Plan Note (Signed)
She continues to have persistent peripheral neuropathy. She declined narcotic prescription for this. I will reduce the dose of chemotherapy as above

## 2016-01-04 NOTE — Assessment & Plan Note (Signed)
She is doing well without any signs of bleeding complication. She will continue anticoagulation therapy indefinitely.

## 2016-01-04 NOTE — Assessment & Plan Note (Signed)
She felt that albuterol causes significant palpitation. I recommend a trial of ipratropium nebulizer to treat her COPD

## 2016-01-09 ENCOUNTER — Telehealth: Payer: Self-pay | Admitting: *Deleted

## 2016-01-09 NOTE — Telephone Encounter (Signed)
Pt states out of Xarelto tomorrow.  She had a lapse in her insurance and does not have insurance until January 1st.  She also does not have the $400 it will cost her for new Rx.   She has already used the free 30 day trial and had $10 co-pay card but that can only be used in conjunction with insurance (which she currently does not have.)  She asks if ok to not take Xarelto until Jan?  Informed her it is NOT safe to not take Xarelto.  We fortunately have 21 pills of Xarelto from drug rep we can give to pt..   She will pick them up at our office tomorrow.

## 2016-01-13 ENCOUNTER — Encounter: Payer: Self-pay | Admitting: Hematology and Oncology

## 2016-01-13 ENCOUNTER — Telehealth: Payer: Self-pay | Admitting: Hematology and Oncology

## 2016-01-13 NOTE — Telephone Encounter (Signed)
12/18 Appointments canceled per patient request. Patient came to scheduling to have appointments canceled.

## 2016-01-13 NOTE — Progress Notes (Signed)
Met w/ pt regarding drug replacement for Neulasta, Aloxi and Emend.  I completed applications for [employer] [place] Patient Assistance and [employer] and faxed them today.  Received confirmation there were received.

## 2016-01-16 ENCOUNTER — Other Ambulatory Visit: Payer: Self-pay | Admitting: Hematology and Oncology

## 2016-01-16 ENCOUNTER — Other Ambulatory Visit: Payer: Self-pay | Admitting: *Deleted

## 2016-01-16 ENCOUNTER — Other Ambulatory Visit: Payer: BLUE CROSS/BLUE SHIELD

## 2016-01-16 ENCOUNTER — Ambulatory Visit: Payer: BLUE CROSS/BLUE SHIELD

## 2016-01-16 DIAGNOSIS — E039 Hypothyroidism, unspecified: Secondary | ICD-10-CM

## 2016-01-16 MED ORDER — FOSAPREPITANT DIMEGLUMINE INJECTION 150 MG: 150.0000 mg | INTRAVENOUS | 3 refills | Status: DC

## 2016-01-17 ENCOUNTER — Telehealth: Payer: Self-pay | Admitting: *Deleted

## 2016-01-17 ENCOUNTER — Encounter: Payer: Self-pay | Admitting: Hematology and Oncology

## 2016-01-17 NOTE — Telephone Encounter (Signed)
Pt reports she stopped her HCTZ 2 weeks ago because she was having leg cramps and trouble voiding. She had been taking 1/2 tablet every other day. Monday her legs started swelling, so she took an HCTZ. Today her BP was 151/90 in left arm and 147/77 in right arm. She feels like she has been having panic attacks today, neck has been hurting- using lidocaine patches and heating pad. Was going to take a xanax to see if that would help her anxiety.  Wants to make Dr Alvy Bimler aware and what does she suggest.  Dr Alvy Bimler states she can take HCTZ every day for 3-5 days until swelling goes down. Then go back to every other day

## 2016-01-17 NOTE — Progress Notes (Signed)
Pt is eligible to receive Aloxi at no cost through the [employer] Patient Assistance Program.

## 2016-01-18 ENCOUNTER — Encounter: Payer: Self-pay | Admitting: Hematology and Oncology

## 2016-01-18 ENCOUNTER — Other Ambulatory Visit: Payer: Self-pay | Admitting: *Deleted

## 2016-01-18 ENCOUNTER — Ambulatory Visit: Payer: BLUE CROSS/BLUE SHIELD

## 2016-01-18 NOTE — Progress Notes (Signed)
Per [employer] rep, [employer] does not back date or replace drugs so pt will not be approved for assistance for past charges for [employer].

## 2016-01-18 NOTE — Progress Notes (Signed)
Pt is eligible to receive Neulasta free of charge thru [place] from 01/18/16 to 01/17/17.  They will go back 6 months so I will submit a product replacement request for 11/17/15 dos.

## 2016-01-20 ENCOUNTER — Telehealth: Payer: Self-pay | Admitting: *Deleted

## 2016-01-20 MED ORDER — OXYCODONE HCL ER 10 MG PO T12A: 10.0000 mg | EXTENDED_RELEASE_TABLET | Freq: Two times a day (BID) | ORAL | 0 refills | Status: DC

## 2016-01-20 NOTE — Telephone Encounter (Signed)
Pt left message requesting pain medicine for neck pain.

## 2016-01-27 ENCOUNTER — Telehealth: Payer: Self-pay | Admitting: *Deleted

## 2016-01-27 ENCOUNTER — Telehealth: Payer: Self-pay | Admitting: Medical Oncology

## 2016-01-27 MED ORDER — LIDOCAINE 5 % EX PTCH: 1.0000 | MEDICATED_PATCH | CUTANEOUS | 1 refills | Status: AC

## 2016-01-27 NOTE — Telephone Encounter (Signed)
Escribed [facility].  LVM for pt to notify of refill sent to [facility].

## 2016-01-27 NOTE — Telephone Encounter (Signed)
Pt requests refill  lidocaine patches and to call [facility]. I do not see these patches on her MAR. Note to [facility] nurse

## 2016-02-03 ENCOUNTER — Telehealth: Payer: Self-pay | Admitting: *Deleted

## 2016-02-03 NOTE — Telephone Encounter (Signed)
Yes she can continue prednisone indefinitely; she can stay between 5 to 10 mg daily depending on her symptoms I would start at 10 mg daily and re-evaluate in the next visit

## 2016-02-03 NOTE — Telephone Encounter (Signed)
Pt left a message stating she went to Dr French Ana for neck pain yesterday. He feels like going off prednisone could have something to do with neck hurting. Had injection in neck, but he wanted her to see if it is OK to go back on prednisone. Ankles have stopped swelling.

## 2016-02-03 NOTE — Telephone Encounter (Signed)
Pt notified of message below. Verbalized understanding 

## 2016-02-06 ENCOUNTER — Ambulatory Visit: Payer: BLUE CROSS/BLUE SHIELD

## 2016-02-06 ENCOUNTER — Encounter: Payer: Self-pay | Admitting: Hematology and Oncology

## 2016-02-06 ENCOUNTER — Other Ambulatory Visit (HOSPITAL_BASED_OUTPATIENT_CLINIC_OR_DEPARTMENT_OTHER): Payer: BLUE CROSS/BLUE SHIELD

## 2016-02-06 ENCOUNTER — Ambulatory Visit (HOSPITAL_BASED_OUTPATIENT_CLINIC_OR_DEPARTMENT_OTHER): Payer: BLUE CROSS/BLUE SHIELD | Admitting: Hematology and Oncology

## 2016-02-06 VITALS — BP 158/73 | HR 71 | Temp 98.1°F | Resp 18 | Ht 74.0 in | Wt 242.1 lb

## 2016-02-06 DIAGNOSIS — Z66 Do not resuscitate: Secondary | ICD-10-CM

## 2016-02-06 DIAGNOSIS — M4850XS Collapsed vertebra, not elsewhere classified, site unspecified, sequela of fracture: Secondary | ICD-10-CM

## 2016-02-06 DIAGNOSIS — C3411 Malignant neoplasm of upper lobe, right bronchus or lung: Secondary | ICD-10-CM

## 2016-02-06 DIAGNOSIS — E039 Hypothyroidism, unspecified: Secondary | ICD-10-CM

## 2016-02-06 DIAGNOSIS — Z7189 Other specified counseling: Secondary | ICD-10-CM | POA: Diagnosis not present

## 2016-02-06 DIAGNOSIS — G893 Neoplasm related pain (acute) (chronic): Secondary | ICD-10-CM | POA: Insufficient documentation

## 2016-02-06 LAB — CBC WITH DIFFERENTIAL/PLATELET
BASO%: 0.3 % (ref 0.0–2.0)
BASOS ABS: 0 10*3/uL (ref 0.0–0.1)
EOS ABS: 0 10*3/uL (ref 0.0–0.5)
EOS%: 0.6 % (ref 0.0–7.0)
HEMATOCRIT: 34.4 % — AB (ref 34.8–46.6)
HGB: 11 g/dL — ABNORMAL LOW (ref 11.6–15.9)
LYMPH#: 1.1 10*3/uL (ref 0.9–3.3)
LYMPH%: 15.4 % (ref 14.0–49.7)
MCH: 31 pg (ref 25.1–34.0)
MCHC: 32.1 g/dL (ref 31.5–36.0)
MCV: 96.5 fL (ref 79.5–101.0)
MONO#: 0.7 10*3/uL (ref 0.1–0.9)
MONO%: 9.5 % (ref 0.0–14.0)
NEUT#: 5.5 10*3/uL (ref 1.5–6.5)
NEUT%: 74.2 % (ref 38.4–76.8)
Platelets: 269 10*3/uL (ref 145–400)
RBC: 3.56 10*6/uL — ABNORMAL LOW (ref 3.70–5.45)
RDW: 17.9 % — ABNORMAL HIGH (ref 11.2–14.5)
WBC: 7.4 10*3/uL (ref 3.9–10.3)

## 2016-02-06 LAB — COMPREHENSIVE METABOLIC PANEL
ALBUMIN: 3.8 g/dL (ref 3.5–5.0)
ALK PHOS: 89 U/L (ref 40–150)
ALT: 8 U/L (ref 0–55)
ANION GAP: 11 meq/L (ref 3–11)
AST: 11 U/L (ref 5–34)
BUN: 13 mg/dL (ref 7.0–26.0)
CALCIUM: 9.6 mg/dL (ref 8.4–10.4)
CHLORIDE: 105 meq/L (ref 98–109)
CO2: 27 mEq/L (ref 22–29)
Creatinine: 0.8 mg/dL (ref 0.6–1.1)
EGFR: 75 mL/min/{1.73_m2} — AB (ref >90)
Glucose: 123 mg/dl (ref 70–140)
POTASSIUM: 3.7 meq/L (ref 3.5–5.1)
Sodium: 143 mEq/L (ref 136–145)
Total Bilirubin: 0.25 mg/dL (ref 0.20–1.20)
Total Protein: 7.6 g/dL (ref 6.4–8.3)

## 2016-02-06 LAB — TSH: TSH: 8.821 m[IU]/L — AB (ref 0.308–3.960)

## 2016-02-06 MED ORDER — TEMAZEPAM 15 MG PO CAPS: 15.0000 mg | ORAL_CAPSULE | Freq: Every evening | ORAL | 1 refills | Status: AC | PRN

## 2016-02-06 MED ORDER — HYDROMORPHONE HCL 4 MG/ML IJ SOLN
1.0000 mg | Freq: Once | INTRAMUSCULAR | Status: AC
Start: 2016-02-06 — End: 2016-02-06
  Administered 2016-02-06: 1 mg via INTRAVENOUS

## 2016-02-06 MED ORDER — SODIUM CHLORIDE 0.9 % IJ SOLN
10.0000 mL | INTRAMUSCULAR | Status: DC | PRN
  Administered 2016-02-06: 10 mL via INTRAVENOUS
  Filled 2016-02-06: qty 10

## 2016-02-06 MED ORDER — HYDROMORPHONE HCL 4 MG/ML IJ SOLN
INTRAMUSCULAR | Status: AC
  Filled 2016-02-06: qty 1

## 2016-02-06 MED ORDER — HYDROMORPHONE HCL 4 MG PO TABS: 4.0000 mg | ORAL_TABLET | ORAL | 0 refills | Status: DC | PRN

## 2016-02-06 NOTE — Assessment & Plan Note (Signed)
She is known to have pathological fracture of spine She has new, worsening bone pain and worries she may have new fracture I will order MRI for evaluation and depending on the location, she may benefit from short course XRT If not, we will manage her pain with pain medications only

## 2016-02-06 NOTE — Assessment & Plan Note (Signed)
We discussed the goals of care. The patient has progressed to multiple lines of treatment and she is miserable with chronic persistent respiratory symptoms. Ultimately, after extensive discussion, the patient would like to stop treatment. She agreed to be referred to home based palliative care We clarified code status again and she agreed with DNR We discussed prognosis would likely be less than 6 months

## 2016-02-06 NOTE — Progress Notes (Signed)
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      12/13/2013 Imaging    CT scan show large mass in the right lung apex likely representing primary lung carcinoma or large metastasis. Metastases demonstrated in the pretracheal lymph nodes, T1 vertebral body and possibly sacrum.      12/14/2013 Initial Diagnosis    Adenocarcinoma of upper lobe of right lung      12/14/2013 Pathology Results    Accession: [id number] biopsy from right upper lobe showed adenocarcinoma. [employer] testing was positive for several mutations without any known treatment options now      12/17/2013 Imaging    MRI of the head is negative.      12/23/2013 - 02/05/2014 Radiation Therapy    She received radiation therapy to the mediastinal mass and bone      12/28/2013 Imaging    PET/CT scan showed mediastinal mass, lymphadenopathy and T1 involvement.      01/01/2014 - 01/29/2014 Chemotherapy    Weekly carboplatin and Taxol were added      03/12/2014 Imaging    Repeat PET scan showed near complete response to treatment.      03/16/2014 - 05/28/2014 Chemotherapy    She started treatment with Alimta for maintenance      07/07/2014 Imaging    PET scan showed disease progression      07/15/2014 - 09/23/2014 Chemotherapy    She received palliative treatment with Opdivo      08/13/2014 - 08/25/2014 Radiation Therapy    She completed  palliative radiation therapy to the bone.      10/05/2014 Imaging    PET CT showed disease progression except for site of recent radiation therapy      10/13/2014 - 04/06/2015 Chemotherapy    She received Rx with weekly carbo/taxol, 2 weeks on 1 week off with Avastin every 3 weeks      12/15/2014 Imaging    PET scan showed positive response to Rx      01/18/2015 Imaging    Ct chest showed disease improvement      04/19/2015 Imaging    Ct chest diease progression      04/27/2015 - 12/07/2015 Chemotherapy    Treatment is switched to Gemzar and Navelbine      05/04/2015 Adverse Reaction    Treatment was missed & interrupted due to recurrent UTI      06/13/2015 Imaging    MRI head is negative      08/17/2015 Imaging    CT imaging showed positive response to Rx      09/15/2015 - 09/17/2015 Hospital Admission    She was admitted to the hospital due to diagnosis of acute pulmonary embolism      09/15/2015 Imaging    Ct angiogram showed acute PE      01/03/2016 Imaging    Interval progression of bilateral  pulmonary nodules suggesting metastatic progression. 2. Subcarinal lymphadenopathy seen previously has decreased in the interval. 3. Thoracoabdominal aortic atherosclerosis. 4. Stable sclerotic changes in C7 and T1, potentially from prior radiation treatment although prior metastatic disease is a consideration.       INTERVAL HISTORY: Please see below for problem oriented charting. She returns for follow-up  She decided she does not want further chemo She complained of severe 8/10 back/neck pain Oxycodone 10 mg along with steroids (prednisone) did not help She had recent steroids and methocarbamol treatment that did not help She complained of numbness and tingling sensation of both hands No weakness, seizures of new neurological deficits  REVIEW OF SYSTEMS:   Constitutional: Denies fevers, chills or abnormal weight loss Eyes: Denies blurriness of vision Ears, nose, mouth,  throat, and face: Denies mucositis or sore throat Respiratory: Denies cough, dyspnea or wheezes Cardiovascular: Denies palpitation, chest discomfort or lower extremity swelling Gastrointestinal:  Denies nausea, heartburn or change in bowel habits Skin: Denies abnormal skin rashes Lymphatics: Denies new lymphadenopathy or easy bruising Behavioral/Psych: Mood is stable, no new changes  All other systems were reviewed with the patient and are negative.  I have reviewed the past medical history, past surgical history, social history and family history with the patient and they are unchanged from previous note.  ALLERGIES:  is allergic to codeine; penicillins; and sulfa antibiotics.  MEDICATIONS:  Current Outpatient Prescriptions  Medication Sig Dispense Refill  . albuterol (PROVENTIL HFA;VENTOLIN HFA) 108 (90 BASE) MCG/ACT inhaler Inhale 2 puffs into the lungs every 4 (four) hours as needed for wheezing or shortness of breath. 1 Inhaler 5  . albuterol (PROVENTIL) (2.5 MG/3ML) 0.083% nebulizer solution Take 3 mLs (2.5 mg total) by nebulization every 4 (four) hours as needed for wheezing or shortness of breath. 300 mL 2  . amLODipine (NORVASC) 10 MG tablet Take 1 tablet (10 mg total) by mouth at bedtime. (Patient taking differently: Take 10 mg by mouth daily as needed (high blood pressure). ) 90 tablet 2  . ANORO ELLIPTA 62.5-25 MCG/INH AEPB INHALE 1 PUFF INTO THE LUNGS DAILY. 60 each 6  . hydrochlorothiazide (HYDRODIURIL) 25 MG tablet TAKE 1 TABLET BY MOUTH EVERY DAY 30 tablet 1  . ipratropium (ATROVENT) 0.02 % nebulizer solution Take 2.5 mLs (0.5 mg total) by nebulization every 6 (six) hours as needed for wheezing or shortness of breath. 75 mL 12  . levothyroxine (SYNTHROID, LEVOTHROID) 200 MCG tablet TAKE 1 TABLET BY MOUTH DAILY BEFORE BREAKFAST. 90 tablet 0  . lidocaine (LIDODERM) 5 % Place 1 patch onto the skin daily. Remove & Discard patch within 12 hours or as directed by MD 30 patch 1  .  lisinopril (PRINIVIL) 10 MG tablet Take 1 tablet (10 mg total) by mouth at bedtime. (Patient taking differently: Take 10 mg by mouth at bedtime as needed (high blood pressure). ) 30 tablet 9  . LORazepam (ATIVAN) 0.5 MG tablet TAKE 1 TABLET BY MOUTH EVERY 8 HOURS AS NEEDED FOR ANXIETY OR FOR NAUSEA 90 tablet 0  . mirtazapine (REMERON) 15 MG tablet TAKE 1 TABLET (15 MG TOTAL) BY MOUTH AT BEDTIME AS NEEDED (FOR SLEEP.). (Patient taking differently: Take 15 mg by mouth at bedtime. ) 90 tablet 6  . ondansetron (ZOFRAN) 8 MG tablet TAKE 1 TABLET BY MOUTH EVERY 6 HOURS AS NEEDED FOR NAUSEA. 90 tablet 0  . oxyCODONE (OXYCONTIN) 10 mg 12 hr tablet Take 1 tablet (10 mg total) by mouth every 12 (twelve) hours. 20 tablet  0  . predniSONE (DELTASONE) 20 MG tablet TAKE 1 TABLET BY MOUTH DAILY WITH BREAKFAST. (Patient taking differently: 1/2 TABLET BY MOUTH DAILY WITH BREAKFAST.) 30 tablet 0  . prochlorperazine (COMPAZINE) 10 MG tablet TAKE 1 TABLET BY MOUTH EVERY 6 (SIX) HOURS AS NEEDED FOR NAUSEA OR VOMITING. 30 tablet 1  . ranitidine (ZANTAC) 150 MG tablet TAKE 1 TABLET (150 MG TOTAL) BY MOUTH EVERY EVENING. 90 tablet 2  . rivaroxaban (XARELTO) 20 MG TABS tablet Take 1 tablet (20 mg total) by mouth daily with supper. 30 tablet 9  . temazepam (RESTORIL) 15 MG capsule Take 1 capsule (15 mg total) by mouth at bedtime as needed for sleep. 30 capsule 1  . HYDROmorphone (DILAUDID) 4 MG tablet Take 1 tablet (4 mg total) by mouth every 4 (four) hours as needed for severe pain. 60 tablet 0   No current facility-administered medications for this visit.    Facility-Administered Medications Ordered in Other Visits  Medication Dose Route Frequency Provider Last Rate Last Dose  . sodium chloride 0.9 % injection 10 mL  10 mL Intravenous PRN [name], MD   10 mL at 02/06/16 1003    PHYSICAL EXAMINATION: ECOG PERFORMANCE STATUS: 2 - Symptomatic, <50% confined to bed  Vitals:   02/06/16 1024  BP: (!) 158/73  Pulse: 71   Resp: 18  Temp: 98.1 F (36.7 C)   Filed Weights   02/06/16 1024  Weight: 242 lb 1.6 oz (109.8 kg)    GENERAL:alert, no distress and comfortable. She looks Cushingoid SKIN: skin color, texture, turgor are normal, no rashes or significant lesions EYES: normal, Conjunctiva are pink and non-injected, sclera clear OROPHARYNX:no exudate, no erythema and lips, buccal mucosa, and tongue normal  Musculoskeletal:no cyanosis of digits and no clubbing  NEURO: alert & oriented x 3 with fluent speech, no focal motor/sensory deficits  LABORATORY DATA:  I have reviewed the data as listed    Component Value Date/Time   NA 143 02/06/2016 0924   K 3.7 02/06/2016 0924   CL 107 11/27/2015 1406   CO2 27 02/06/2016 0924   GLUCOSE 123 02/06/2016 0924   BUN 13.0 02/06/2016 0924   CREATININE 0.8 02/06/2016 0924   CALCIUM 9.6 02/06/2016 0924   PROT 7.6 02/06/2016 0924   ALBUMIN 3.8 02/06/2016 0924   AST 11 02/06/2016 0924   ALT 8 02/06/2016 0924   ALKPHOS 89 02/06/2016 0924   BILITOT 0.25 02/06/2016 0924   GFRNONAA >60 11/27/2015 1406   GFRAA >60 11/27/2015 1406    No results found for: SPEP, UPEP  Lab Results  Component Value Date   WBC 7.4 02/06/2016   NEUTROABS 5.5 02/06/2016   HGB 11.0 (L) 02/06/2016   HCT 34.4 (L) 02/06/2016   MCV 96.5 02/06/2016   PLT 269 02/06/2016      Chemistry      Component Value Date/Time   NA 143 02/06/2016 0924   K 3.7 02/06/2016 0924   CL 107 11/27/2015 1406   CO2 27 02/06/2016 0924   BUN 13.0 02/06/2016 0924   CREATININE 0.8 02/06/2016 0924      Component Value Date/Time   CALCIUM 9.6 02/06/2016 0924   ALKPHOS 89 02/06/2016 0924   AST 11 02/06/2016 0924   ALT 8 02/06/2016 0924   BILITOT 0.25 02/06/2016 0924       RADIOGRAPHIC STUDIES:I reviewed her previous MRI and CT images I have personally reviewed the radiological images as listed and agreed with the findings in the report.  ASSESSMENT & PLAN:  Adenocarcinoma of upper lobe of  right lung She had near worsening back pain. The patient has made an informed decision not to pursue further palliative chemotherapy. She is in agreement to get enrolled in hospice care. 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 The patient has progressed to multiple lines of treatment and she is miserable with chronic persistent respiratory symptoms. Ultimately, after extensive discussion, the patient would like to stop treatment. She agreed to be referred to home based palliative care We clarified code status again and she agreed with DNR We discussed prognosis would likely be less than 6 months  Cancer associated pain She is adamant pain medications made her sick in the past The combination of 10 mg oxycodone BID and prednisone did not help with her pain and she is reluctant for dose adjustment of oxycodone I recommend a trial of Dilaudid and she agreed We discussed risk of sedation, nausea and constipation   Orders Placed This Encounter  Procedures  . MR THORACIC SPINE W WO CONTRAST    Standing Status:   Future    Standing Expiration Date:   04/05/2017    Order Specific Question:   GRA to provide read?    Answer:   Yes    Order Specific Question:   If indicated for the ordered procedure, I authorize the administration of contrast media per Radiology protocol    Answer:   Yes    Order Specific Question:   Reason for Exam (SYMPTOM  OR DIAGNOSIS REQUIRED)    Answer:   lung cancer, prior T1 compression fracture, worsening pain and mild weakness    Order Specific Question:    Preferred imaging location?    Answer:   [facility] (table limit-350 lbs)    Order Specific Question:   What is the patient's sedation requirement?    Answer:   No Sedation    Order Specific Question:   Does the patient have a pacemaker or implanted devices?    Answer:   No   All questions were answered. The patient knows to call the clinic with any problems, questions or concerns. No barriers to learning was detected. I spent 25 minutes counseling the patient face to face. The total time spent in the appointment was 40 minutes and more than 50% was on counseling and review of test results     [name], MD 02/06/2016 8:40 PM

## 2016-02-06 NOTE — Progress Notes (Signed)
IV pain medication given per MD order in office prior to [facility] de-access.  Pt's son present and driving pt home.

## 2016-02-06 NOTE — Assessment & Plan Note (Signed)
She is adamant pain medications made her sick in the past The combination of 10 mg oxycodone BID and prednisone did not help with her pain and she is reluctant for dose adjustment of oxycodone I recommend a trial of Dilaudid and she agreed We discussed risk of sedation, nausea and constipation

## 2016-02-06 NOTE — Assessment & Plan Note (Signed)
She had near worsening back pain. The patient has made an informed decision not to pursue further palliative chemotherapy. She is in agreement to get enrolled in hospice care. I will send consult to home base palliative care/hospice

## 2016-02-07 ENCOUNTER — Telehealth: Payer: Self-pay | Admitting: *Deleted

## 2016-02-07 LAB — T4, FREE: T4,Free(Direct): 1.84 ng/dL — ABNORMAL HIGH (ref 0.82–1.77)

## 2016-02-07 NOTE — Telephone Encounter (Signed)
Referral to [facility] called to [facility] at [phone].   Faxed notes and demographics to [phone].

## 2016-02-08 ENCOUNTER — Ambulatory Visit: Payer: BLUE CROSS/BLUE SHIELD

## 2016-02-09 ENCOUNTER — Telehealth: Payer: Self-pay | Admitting: *Deleted

## 2016-02-09 ENCOUNTER — Encounter: Payer: Self-pay | Admitting: Hematology and Oncology

## 2016-02-09 ENCOUNTER — Encounter (HOSPITAL_COMMUNITY): Payer: Self-pay

## 2016-02-09 ENCOUNTER — Other Ambulatory Visit: Payer: Self-pay | Admitting: Emergency Medicine

## 2016-02-09 DIAGNOSIS — R51 Headache: Secondary | ICD-10-CM | POA: Insufficient documentation

## 2016-02-09 DIAGNOSIS — E038 Other specified hypothyroidism: Secondary | ICD-10-CM | POA: Diagnosis not present

## 2016-02-09 DIAGNOSIS — Z85850 Personal history of malignant neoplasm of thyroid: Secondary | ICD-10-CM | POA: Diagnosis not present

## 2016-02-09 DIAGNOSIS — Z7901 Long term (current) use of anticoagulants: Secondary | ICD-10-CM | POA: Diagnosis not present

## 2016-02-09 DIAGNOSIS — Z79899 Other long term (current) drug therapy: Secondary | ICD-10-CM | POA: Insufficient documentation

## 2016-02-09 DIAGNOSIS — Z85118 Personal history of other malignant neoplasm of bronchus and lung: Secondary | ICD-10-CM | POA: Insufficient documentation

## 2016-02-09 DIAGNOSIS — I10 Essential (primary) hypertension: Secondary | ICD-10-CM | POA: Diagnosis not present

## 2016-02-09 DIAGNOSIS — M542 Cervicalgia: Secondary | ICD-10-CM | POA: Diagnosis present

## 2016-02-09 DIAGNOSIS — J449 Chronic obstructive pulmonary disease, unspecified: Secondary | ICD-10-CM | POA: Insufficient documentation

## 2016-02-09 DIAGNOSIS — Z87891 Personal history of nicotine dependence: Secondary | ICD-10-CM | POA: Diagnosis not present

## 2016-02-09 NOTE — Telephone Encounter (Signed)
Called pt to f/u on Hospice referral, Lidoderm patches and MRI.   Informed her Lidoderm not approved by her insurance.  Instructed her per Dr. Alvy Bimler to try to rub EMLA cream where she would put the patch.  Cover cream w/ saran wrap to keep from rubbing off.  Pt verbalized understanding. Pt says she has appt w/ Nurse from [facility] on Monday.  She has MRI scheduled on Wednesday..  Instructed pt to inform Hospice of MRI scheduled. They may not cover it and she may have to wait to be admitted to Hospice until after her MRI.  Pt verbalized understanding.

## 2016-02-09 NOTE — ED Triage Notes (Addendum)
Pt BIB EMS from home. Pt states that she has some tingling in the back of her neck and it is causing her anxiety, which she feels is causing some SOB. She also reports a headache that "feels like it is about to explode." Lung sounds clear with EMS. Hx COPD and lung cancer. A&Ox4. She reports an MRI scheduled for Monday.

## 2016-02-09 NOTE — Progress Notes (Signed)
Requested auth to [facility] for Lidocaine patch was denied; does not meet the definition of medical necessity found in member's benefit booklet.  Gave denial to the nurse.

## 2016-02-09 NOTE — ED Notes (Signed)
Last chemo on Nov. 8, 2017.

## 2016-02-09 NOTE — Telephone Encounter (Signed)
Call from pt reporting pharmacy sent prior auth request for lidocaine patches, asking whether Dr. Alvy Bimler has received request. Message to collaborative RN to follow up on prior auth.

## 2016-02-09 NOTE — Telephone Encounter (Signed)
Informed pt our office did receive request for PA for lidoderm patches and we are working on it.  She verbalized understanding.  She also says she has not heard from Hospice yet about referral.  Informed pt I will call Hospice to follow up on referral. After I hung up w/ pt I was notified that her insurance company denied the Lidoderm patches.   I called [facility] to f/u on referral and to ask if they can cover the Lidoderm patches and if they will also cover MRI that is ordered for next week.  I spoke w/ Bambi who says that they forwarded referral to [facility] because they do not take pts insurance ([place]).  She will contact pt to let her know and also find out if MRI will be covered by Hospice.  It may be better for pt to wait to enroll in Hospice until after her MRI is done.

## 2016-02-10 ENCOUNTER — Emergency Department (HOSPITAL_COMMUNITY): Payer: BLUE CROSS/BLUE SHIELD

## 2016-02-10 ENCOUNTER — Other Ambulatory Visit: Payer: Self-pay | Admitting: Emergency Medicine

## 2016-02-10 ENCOUNTER — Emergency Department (HOSPITAL_COMMUNITY)
Admission: EM | Admit: 2016-02-10 | Discharge: 2016-02-10 | Disposition: A | Discharge status: Home / Self Care | Payer: BLUE CROSS/BLUE SHIELD | Attending: Emergency Medicine | Admitting: Emergency Medicine

## 2016-02-10 DIAGNOSIS — R51 Headache: Secondary | ICD-10-CM

## 2016-02-10 DIAGNOSIS — I10 Essential (primary) hypertension: Secondary | ICD-10-CM

## 2016-02-10 DIAGNOSIS — R519 Headache, unspecified: Secondary | ICD-10-CM

## 2016-02-10 LAB — BASIC METABOLIC PANEL
ANION GAP: 10 (ref 5–15)
BUN: 17 mg/dL (ref 6–20)
CHLORIDE: 106 mmol/L (ref 101–111)
CO2: 24 mmol/L (ref 22–32)
Calcium: 8.9 mg/dL (ref 8.9–10.3)
Creatinine, Ser: 0.87 mg/dL (ref 0.44–1.00)
GFR calc Af Amer: >60 mL/min (ref >60)
GLUCOSE: 116 mg/dL — AB (ref 65–99)
POTASSIUM: 4 mmol/L (ref 3.5–5.1)
Sodium: 140 mmol/L (ref 135–145)

## 2016-02-10 LAB — CBC WITH DIFFERENTIAL/PLATELET
BASOS ABS: 0 10*3/uL (ref 0.0–0.1)
Basophils Relative: 0 %
Eosinophils Absolute: 0 10*3/uL (ref 0.0–0.7)
Eosinophils Relative: 1 %
HEMATOCRIT: 37.6 % (ref 36.0–46.0)
Hemoglobin: 11.2 g/dL — ABNORMAL LOW (ref 12.0–15.0)
LYMPHS PCT: 19 %
Lymphs Abs: 1.6 10*3/uL (ref 0.7–4.0)
MCH: 29.5 pg (ref 26.0–34.0)
MCHC: 29.8 g/dL — ABNORMAL LOW (ref 30.0–36.0)
MCV: 98.9 fL (ref 78.0–100.0)
MONO ABS: 0.8 10*3/uL (ref 0.1–1.0)
Monocytes Relative: 9 %
NEUTROS ABS: 5.9 10*3/uL (ref 1.7–7.7)
Neutrophils Relative %: 71 %
Platelets: 264 10*3/uL (ref 150–400)
RBC: 3.8 MIL/uL — AB (ref 3.87–5.11)
RDW: 15.8 % — ABNORMAL HIGH (ref 11.5–15.5)
WBC: 8.3 10*3/uL (ref 4.0–10.5)

## 2016-02-10 NOTE — ED Provider Notes (Signed)
[facility] DEPT Provider Note   CSN: [id number] Arrival date & time: 02/09/16  1941  By signing my name below, I, Jasmine Grant, attest that this documentation has been prepared under the direction and in the presence of Veryl Speak, MD.  Electronically Signed: Julien Nordmann, ED Scribe. 02/10/16. 12:48 AM.    History   Chief Complaint Chief Complaint  Patient presents with  . Cancer Patient  . Anxiety  . Neck Pain   The history is provided by the patient and the EMS personnel. No language interpreter was used.  Neck Pain   This is a recurrent problem. The current episode started more than 1 week ago. The problem occurs rarely. The problem has been gradually worsening. The pain is associated with an unknown factor. There has been no fever. The pain is present in the generalized neck. Quality: tingling. The pain is moderate. Associated symptoms include headaches and tingling. She has tried ice for the symptoms. The treatment provided mild relief.   HPI Comments: Jasmine Grant is a 61 y.o. female brought in by ambulance, who has a PMhx of thyroid cancer, lung cancer, lymphedema of the face,UTI, a-fib, COPD, and pulmonary embolism presents to the Emergency Department complaining of moderate, gradual worsening, posterior neck pain that radiates into the crown of her head for the past 3-4 weeks. She describes the pain as a tingling sensation. Pt does not have hx of headaches and she denies any trauma or head injury. Pt states feeling light-headed when she lays down for the past 1-2 weeks. She also expresses having a higher blood pressure than normal due to her pain which caused her anxiety. Her BP was 190/90 when checked by EMS and she is complaint with her amlodipine and lisinopril. 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 Her last chemo was 12/07/15. She has been taking 2 mg of dilaudid and Lidoderm patches  to alleviate her pain with moderate relief. Pt has an MRI on 01/17 to see if the cancer has spread to her brain. She is currently on xarelto. There are no other complaints noted.   Oncologist: Dr. [name] Past Medical History:  Diagnosis Date  . Arthritis 04/27/2014  . Drug-induced hypokalemia 05/26/2015  . Dysuria 11/03/2014  . Essential hypertension 04/27/2014  . Headache disorder 06/08/2015  . Hx of thyroid cancer    10 years ago  . Insomnia 12/29/2013  . Lung cancer ([place]) 12/14/13   Adenocarcinoma of right upper lobe mediastinal mass and bone   . 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 Panic attack   . S/P radiation therapy completed 12/16/13-02/05/14   RULL lung  . Thyroid ca [facility]) dx'd 20+yrs ago   surg only  . UTI (lower urinary tract infection) 05/26/2015  . UTI (urinary tract infection) 04/29/2015    Patient Active Problem List   Diagnosis Date Noted  . Cancer associated pain 02/06/2016  . Atrial fibrillation, transient ([place]) 11/30/2015  . Goals of care, counseling/discussion 10/19/2015  . Atypical chest pain 10/05/2015  . Dizziness 10/05/2015  . Pulmonary embolism ([place]) 09/15/2015  . Pulmonary embolus ([place]) 09/15/2015  . Radiation pneumonitis ([place]) 08/18/2015  . DNR (do not resuscitate) 08/18/2015  . Acute bronchitis due to infection 07/27/2015  . Leukocytosis 07/07/2015  . Headache disorder 06/08/2015  . UTI (lower urinary tract infection) 05/26/2015  . Drug-induced hypokalemia 05/26/2015  . Dehydration 05/26/2015  . Chemotherapy-induced nausea 05/26/2015  .  Palliative care by specialist 05/18/2015  . Thrombocytopenia ([place]) 02/24/2015  . Other specified hypothyroidism 02/03/2015  . Numbness and tingling of right arm 01/12/2015  . Rash 11/26/2014  . COPD (chronic obstructive pulmonary disease) ([place]) 11/25/2014  . Pancytopenia due to antineoplastic chemotherapy ([place]) 11/10/2014  . Neuropathy due to chemotherapeutic drug ([place]) 11/10/2014  . Dysuria 11/03/2014  . Cough  10/06/2014  . 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 Protein calorie malnutrition ([place]) 08/26/2014  . Elevated serum creatinine 08/26/2014  . Right shoulder pain 06/17/2014  . 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 Arthritis 04/27/2014  . Bilateral leg edema 04/27/2014  . Sinusitis, chronic 04/27/2014  . Essential hypertension 04/27/2014  . Other constipation 04/27/2014  . Cellulitis 04/20/2014  . Anemia due to antineoplastic chemotherapy 03/16/2014  . Cyst of skin 02/26/2014  . Other fatigue 02/26/2014  . Leukopenia due to antineoplastic chemotherapy ([place]) 01/28/2014  . Radiation-induced dermatitis 01/28/2014  . Esophagitis 01/07/2014  . Hypersensitivity reaction 01/04/2014  . Insomnia 12/29/2013  . Constipation due to opioid therapy 12/17/2013  . Adenocarcinoma of upper lobe of right lung   . Nausea with vomiting 12/16/2013  . Elevated blood pressure 12/15/2013  . Generalized anxiety disorder 12/14/2013  . Acquired hypothyroidism 12/14/2013  . Overweight (BMI 25.0-29.9) 12/14/2013  . Epidural mass 12/13/2013  . 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 Pathologic compression fracture of spine [facility])     Past Surgical History:  Procedure Laterality Date  . THYROIDECTOMY      OB History    No data available       Home Medications    Prior to Admission medications   Medication Sig Start Date End Date Taking? Authorizing Provider  albuterol (PROVENTIL) (2.5 MG/3ML) 0.083% nebulizer solution Take 3 mLs (2.5 mg total) by nebulization every 4 (four) hours as needed for wheezing or shortness of breath. 10/28/15   Collene Gobble, MD  amLODipine (NORVASC) 10 MG tablet Take 1 tablet (10 mg total) by mouth at bedtime. Patient taking differently: Take 10 mg by mouth daily as needed (high blood pressure).  07/13/15   Ni Gorsuch, MD  ANORO ELLIPTA 62.5-25 MCG/INH AEPB INHALE 1 PUFF INTO THE LUNGS DAILY. 01/04/16   Collene Gobble, MD  hydrochlorothiazide (HYDRODIURIL) 25 MG tablet TAKE 1 TABLET BY MOUTH  EVERY DAY 06/21/15   [name], MD  HYDROmorphone (DILAUDID) 4 MG tablet Take 1 tablet (4 mg total) by mouth every 4 (four) hours as needed for severe pain. 02/06/16   [name], MD  ipratropium (ATROVENT) 0.02 % nebulizer solution Take 2.5 mLs (0.5 mg total) by nebulization every 6 (six) hours as needed for wheezing or shortness of breath. 11/30/15   [name], MD  levothyroxine (SYNTHROID, LEVOTHROID) 200 MCG tablet TAKE 1 TABLET BY MOUTH DAILY BEFORE BREAKFAST. 01/16/16   [name], MD  lidocaine (LIDODERM) 5 % Place 1 patch onto the skin daily. Remove & Discard patch within 12 hours or as directed by MD 01/27/16   [name], MD  lisinopril (PRINIVIL) 10 MG tablet Take 1 tablet (10 mg total) by mouth at bedtime. Patient taking differently: Take 10 mg by mouth at bedtime as needed (high blood pressure).  07/13/15   Ni Gorsuch, MD  LORazepam (ATIVAN) 0.5 MG tablet TAKE 1 TABLET BY MOUTH EVERY 8 HOURS AS NEEDED FOR ANXIETY OR FOR NAUSEA 12/27/14   [name], MD  mirtazapine (REMERON) 15 MG tablet TAKE 1 TABLET (15 MG TOTAL) BY MOUTH AT BEDTIME AS NEEDED (FOR SLEEP.). Patient  taking differently: Take 15 mg by mouth at bedtime.  04/20/15   Ni Gorsuch, MD  ondansetron (ZOFRAN) 8 MG tablet TAKE 1 TABLET BY MOUTH EVERY 6 HOURS AS NEEDED FOR NAUSEA. 12/31/14   [name], MD  oxyCODONE (OXYCONTIN) 10 mg 12 hr tablet Take 1 tablet (10 mg total) by mouth every 12 (twelve) hours. 01/20/16   [name], MD  predniSONE (DELTASONE) 20 MG tablet TAKE 1 TABLET BY MOUTH DAILY WITH BREAKFAST. Patient taking differently: 1/2 TABLET BY MOUTH DAILY WITH BREAKFAST. 12/14/15   [name], MD  prochlorperazine (COMPAZINE) 10 MG tablet TAKE 1 TABLET BY MOUTH EVERY 6 (SIX) HOURS AS NEEDED FOR NAUSEA OR VOMITING. 06/16/15   [name], MD  ranitidine (ZANTAC) 150 MG tablet TAKE 1 TABLET (150 MG TOTAL) BY MOUTH EVERY EVENING. 11/07/15   [name], MD  rivaroxaban (XARELTO) 20 MG TABS tablet Take 1 tablet (20 mg total) by mouth  daily with supper. 10/04/15   [name], MD  temazepam (RESTORIL) 15 MG capsule Take 1 capsule (15 mg total) by mouth at bedtime as needed for sleep. 02/06/16   Ni Gorsuch, MD  VENTOLIN HFA 108 (90 Base) MCG/ACT inhaler INHALE 2 PUFFS INTO THE LUNGS EVERY 4 (FOUR) HOURS AS NEEDED FOR WHEEZING OR SHORTNESS OF BREATH. 02/09/16   Collene Gobble, MD    Family History Family History  Problem Relation Age of Onset  . Heart disease Mother   . Heart disease Father   . Thyroid cancer Father     Social History Social History  Substance Use Topics  . Smoking status: Former Smoker    Packs/day: 1.00    Years: 30.00    Types: Cigarettes    Quit date: 01/29/2009  . Smokeless tobacco: Never Used  . Alcohol use 0.0 oz/week     Allergies   Codeine; Penicillins; and Sulfa antibiotics   Review of Systems Review of Systems  Musculoskeletal: Positive for neck pain.  Neurological: Positive for tingling, light-headedness and headaches.  All other systems reviewed and are negative.    Physical Exam Updated Vital Signs BP 153/92 (BP Location: Right Arm)   Pulse 65   Temp 98.1 F (36.7 C) (Oral)   Resp 20   SpO2 96%   Physical Exam  Constitutional: She is oriented to person, place, and time. She appears well-developed and well-nourished. No distress.  HENT:  Head: Normocephalic and atraumatic.  Eyes: EOM are normal.  Neck: Normal range of motion.  Cardiovascular: Normal rate, regular rhythm and normal heart sounds.   Pulmonary/Chest: Effort normal and breath sounds normal.  Abdominal: Soft. She exhibits no distension. There is no tenderness.  Musculoskeletal: Normal range of motion.  Neurological: She is alert and oriented to person, place, and time. No cranial nerve deficit. She exhibits normal muscle tone. Coordination normal.  Skin: Skin is warm and dry.  Psychiatric: She has a normal mood and affect. Judgment normal.  Nursing note and vitals reviewed.    ED Treatments / Results    DIAGNOSTIC STUDIES: Oxygen Saturation is 96% on RA, adequate by my interpretation.  COORDINATION OF CARE:  12:35 AM Discussed treatment plan with pt at bedside and pt agreed to plan.  Labs (all labs ordered are listed, but only abnormal results are displayed) Labs Reviewed - No data to display  EKG ED ECG REPORT   Date: 02/10/2016  Rate: 80  Rhythm: normal sinus rhythm  QRS Axis: normal  Intervals: normal  ST/T Wave abnormalities: normal  Conduction  Disutrbances:none  Narrative Interpretation:   Old EKG Reviewed: unchanged  I have personally reviewed the EKG tracing and agree with the computerized printout as noted.   Radiology No results found.  Procedures Procedures (including critical care time)  Medications Ordered in ED Medications - No data to display   Initial Impression / Assessment and Plan / ED Course  I have reviewed the triage vital signs and the nursing notes.  Pertinent labs & imaging results that were available during my care of the patient were reviewed by me and considered in my medical decision making (see chart for details).  Clinical Course     Patient presents here with complaints of pain in the neck and the back of her head, as well as elevated blood pressure. She has a history of lung cancer and is discussing placement in hospice care. She feels as though this conversation today with hospice may have stressed her out, causing her blood pressure to elevate and her neck and head to hurt. She is neurologically intact, laboratory studies and CT scan are reassuring, and she is feeling better after taking her home pain medications. Her blood pressure is now 140/70 and has never been significantly elevated while in the ER. She is to follow-up with her oncologist and primary doctor in the next week.  Final Clinical Impressions(s) / ED Diagnoses   Final diagnoses:  None   I personally performed the services described in this documentation, which was  scribed in my presence. The recorded information has been reviewed and is accurate.   New Prescriptions New Prescriptions   No medications on file     Veryl Speak, MD 02/10/16 [phone]

## 2016-02-10 NOTE — Discharge Instructions (Signed)
Continue your medications as before.  Return to the emergency department if your symptoms significantly worsen or change.

## 2016-02-10 NOTE — ED Notes (Signed)
Pt requested for her port to be access for blood work.

## 2016-02-14 ENCOUNTER — Other Ambulatory Visit: Payer: Self-pay | Admitting: Hematology and Oncology

## 2016-02-15 ENCOUNTER — Other Ambulatory Visit: Payer: Self-pay | Admitting: *Deleted

## 2016-02-15 ENCOUNTER — Telehealth: Payer: Self-pay | Admitting: *Deleted

## 2016-02-15 ENCOUNTER — Ambulatory Visit (HOSPITAL_COMMUNITY)
Admission: RE | Admit: 2016-02-15 | Payer: BLUE CROSS/BLUE SHIELD | Source: Ambulatory Visit | Admitting: Hematology and Oncology

## 2016-02-15 MED ORDER — ALBUTEROL SULFATE HFA 108 (90 BASE) MCG/ACT IN AERS: 2.0000 | INHALATION_SPRAY | RESPIRATORY_TRACT | 0 refills | Status: AC | PRN

## 2016-02-15 NOTE — Telephone Encounter (Signed)
"  My Pharmacy has not heard from you all about my inhaler refill."  Advised check again with [facility].  EPIC Records show this has been refilled.

## 2016-02-16 ENCOUNTER — Telehealth: Payer: Self-pay | Admitting: *Deleted

## 2016-02-16 NOTE — Telephone Encounter (Signed)
"  I'm having panic attacks and need something to calm me.  I took half a dilaudid 2 mg pill at 11:30 am.  I have lorazepam 0.5 mg on hand.  Can I take lorazepam the same time I'm using Dilaudid?  Call me at [phone]."

## 2016-02-16 NOTE — Telephone Encounter (Signed)
Informed pt ok to take th ativan. Do not take at same time as dilaudid, space apart at least one hour.  Cautioned about sedation.  Pt verbalized understanding.  States her husband just diagnosed w/ colon cancer and she is getting ready to enroll in Hospice so she is having a difficult time with her anxiety.  Pt says she had to r/s her MRI from yesterday to this Saturday d/t the weather.  She hopes Dr. Alvy Bimler will call her with results.  She will enroll with Hospice after her MRI,  Since Hospice would not cover the MRI.

## 2016-02-18 ENCOUNTER — Ambulatory Visit (HOSPITAL_COMMUNITY)
Admission: RE | Admit: 2016-02-18 | Discharge: 2016-02-18 | Disposition: A | Discharge status: Home / Self Care | Payer: BLUE CROSS/BLUE SHIELD | Source: Ambulatory Visit | Attending: Hematology and Oncology | Admitting: Hematology and Oncology

## 2016-02-18 DIAGNOSIS — C3411 Malignant neoplasm of upper lobe, right bronchus or lung: Secondary | ICD-10-CM

## 2016-02-18 NOTE — Progress Notes (Signed)
Attempted pt 02/18/2016   Pt had taken anti anxiety meds, but will still too claustro    She will call to RS for the wide bore scanner at [facility].

## 2016-02-20 ENCOUNTER — Telehealth: Payer: Self-pay

## 2016-02-20 ENCOUNTER — Other Ambulatory Visit: Payer: Self-pay | Admitting: Hematology and Oncology

## 2016-02-20 DIAGNOSIS — C3411 Malignant neoplasm of upper lobe, right bronchus or lung: Secondary | ICD-10-CM

## 2016-02-20 DIAGNOSIS — M4850XS Collapsed vertebra, not elsewhere classified, site unspecified, sequela of fracture: Secondary | ICD-10-CM

## 2016-02-20 NOTE — Telephone Encounter (Signed)
Patient called requesting her MRI to be rescheduled at [facility] for open MRI due to "panic attack." [facility] to schedule. MRI will need to be reordered, as previous order has already been assigned an accession number. Patient notified awaiting on pre-cert. Will call her when appt is scheduled.

## 2016-02-20 NOTE — Telephone Encounter (Signed)
I reordered it Please make sure the order is correct

## 2016-02-22 ENCOUNTER — Telehealth: Payer: Self-pay | Admitting: Hematology and Oncology

## 2016-02-22 ENCOUNTER — Telehealth: Payer: Self-pay | Admitting: *Deleted

## 2016-02-22 NOTE — Telephone Encounter (Signed)
lvm to inform pt of port access appts 1/31 at 11 am and deaccess appt at 145 pm for MRI per LOS

## 2016-02-22 NOTE — Telephone Encounter (Signed)
FYI "[facility] told me to call to schedule port-access before the MRI on 02-29-2016.  I'm claustrophobic going here for open MRI.  No one there can access the port-a-cath.  I'll have to arrive for imaging at 12:15."  This nurse entered scheduling message for Power port access and de-access.

## 2016-02-27 ENCOUNTER — Other Ambulatory Visit: Payer: Self-pay | Admitting: *Deleted

## 2016-02-27 MED ORDER — HYDROMORPHONE HCL 2 MG PO TABS: 2.0000 mg | ORAL_TABLET | ORAL | 0 refills | Status: DC | PRN

## 2016-02-27 NOTE — Telephone Encounter (Signed)
Pt requesting Dilaudid 2 mg instead of 4 mg.

## 2016-02-29 ENCOUNTER — Ambulatory Visit: Payer: BLUE CROSS/BLUE SHIELD

## 2016-02-29 ENCOUNTER — Ambulatory Visit
Admission: RE | Admit: 2016-02-29 | Discharge: 2016-02-29 | Disposition: A | Discharge status: Home / Self Care | Payer: BLUE CROSS/BLUE SHIELD | Source: Ambulatory Visit | Attending: Hematology and Oncology | Admitting: Hematology and Oncology

## 2016-02-29 ENCOUNTER — Ambulatory Visit (HOSPITAL_BASED_OUTPATIENT_CLINIC_OR_DEPARTMENT_OTHER): Payer: BLUE CROSS/BLUE SHIELD

## 2016-02-29 DIAGNOSIS — Z452 Encounter for adjustment and management of vascular access device: Secondary | ICD-10-CM | POA: Diagnosis not present

## 2016-02-29 DIAGNOSIS — C3411 Malignant neoplasm of upper lobe, right bronchus or lung: Secondary | ICD-10-CM | POA: Diagnosis not present

## 2016-02-29 DIAGNOSIS — M4850XS Collapsed vertebra, not elsewhere classified, site unspecified, sequela of fracture: Secondary | ICD-10-CM

## 2016-02-29 MED ORDER — SODIUM CHLORIDE 0.9 % IJ SOLN
10.0000 mL | INTRAMUSCULAR | Status: DC | PRN
  Administered 2016-02-29: 10 mL via INTRAVENOUS
  Filled 2016-02-29: qty 10

## 2016-02-29 MED ORDER — HEPARIN SOD (PORK) LOCK FLUSH 100 UNIT/ML IV SOLN
500.0000 [IU] | Freq: Once | INTRAVENOUS | Status: AC | PRN
  Administered 2016-02-29: 500 [IU] via INTRAVENOUS
  Filled 2016-02-29: qty 5

## 2016-02-29 MED ORDER — GADOBENATE DIMEGLUMINE 529 MG/ML IV SOLN
20.0000 mL | Freq: Once | INTRAVENOUS | Status: AC | PRN
  Administered 2016-02-29: 20 mL via INTRAVENOUS

## 2016-02-29 NOTE — Patient Instructions (Signed)

## 2016-03-01 ENCOUNTER — Telehealth: Payer: Self-pay | Admitting: Hematology and Oncology

## 2016-03-01 NOTE — Telephone Encounter (Signed)
I reviewed the MRI with the patient's. She is having significant pain. She does not tolerate pain medicine well. The patient sounds very distressed at the prospect that the bone lesion may cause neurological compromise. She is interested to undergo further palliative radiation therapy if possible and also brought up the fact that she may be interested to do more chemotherapy because she is not ready to die She rescinded her decision for palliative care/hospice I will contact radiation oncologist to set up urgent appointment. I felt that she would be a good candidate for palliative care consult. I will arrange that as well.

## 2016-03-02 NOTE — Progress Notes (Addendum)
Reconsult  Metastatic Lung cancer with osseous metastasis now  For   possible Radiation  Palliative Spine   Radiation  Palliative  08/11/14-08/25/14  To C3-C5 30 Gy/10 fx   Dr. Homero Fellers seen 02/06/16:  ,patient has changed her mind declined hospice at this time, ,, will see Faylene Million palliative RN today to discuss , patient decided no more chemotherapy Worsening back pain  At present none, takes dilaudid  MRI 02/29/16:  IMPRESSION: Metastatic disease affecting the T1 vertebral body. Early extraosseous disease encroaching upon the ventral aspect of the spinal canal. No cord compression at this time.  Extensive metastatic disease affecting the C3 and C4 vertebral bodies with extension into the posterior elements on the left. Suspicion of at least some canal compromise. This level was not studied in detail. Complete imaging of the cervical spine would be suggested to understand that anatomy better.  Suspicion of patchy metastatic disease affecting the C6 and C7 vertebral bodies. Wt Readings from Last 3 Encounters:  03/05/16 237 lb 9.6 oz (107.8 kg)  02/18/16 242 lb (109.8 kg)  02/06/16 242 lb 1.6 oz (109.8 kg)   BP (!) 161/113 (BP Location: Left Arm, Patient Position: Sitting, Cuff Size: Large)   Pulse 78   Temp 99.1 F (37.3 C) (Oral)   Resp 20   Ht '6\' 2"'$  (1.88 m)   Wt 237 lb 9.6 oz (107.8 kg)   SpO2 97%   BMI 30.51 kg/m

## 2016-03-05 ENCOUNTER — Encounter: Payer: Self-pay | Admitting: Radiation Oncology

## 2016-03-05 ENCOUNTER — Ambulatory Visit
Admission: RE | Admit: 2016-03-05 | Discharge: 2016-03-05 | Disposition: A | Discharge status: Home / Self Care | Payer: BLUE CROSS/BLUE SHIELD | Source: Ambulatory Visit | Attending: Radiation Oncology | Admitting: Radiation Oncology

## 2016-03-05 ENCOUNTER — Ambulatory Visit
Admission: RE | Admit: 2016-03-05 | Discharge: 2016-03-05 | Disposition: A | Discharge status: Home / Self Care | Payer: BLUE CROSS/BLUE SHIELD | Source: Ambulatory Visit | Attending: Internal Medicine | Admitting: Internal Medicine

## 2016-03-05 VITALS — BP 161/113 | HR 78 | Temp 99.1°F | Resp 20 | Ht 74.0 in | Wt 237.6 lb

## 2016-03-05 DIAGNOSIS — Z510 Encounter for antineoplastic radiation therapy: Secondary | ICD-10-CM | POA: Diagnosis present

## 2016-03-05 DIAGNOSIS — Z515 Encounter for palliative care: Secondary | ICD-10-CM | POA: Diagnosis not present

## 2016-03-05 DIAGNOSIS — C7951 Secondary malignant neoplasm of bone: Secondary | ICD-10-CM

## 2016-03-05 DIAGNOSIS — Z87891 Personal history of nicotine dependence: Secondary | ICD-10-CM | POA: Diagnosis not present

## 2016-03-05 DIAGNOSIS — Z7901 Long term (current) use of anticoagulants: Secondary | ICD-10-CM | POA: Diagnosis not present

## 2016-03-05 DIAGNOSIS — Z79899 Other long term (current) drug therapy: Secondary | ICD-10-CM | POA: Insufficient documentation

## 2016-03-05 DIAGNOSIS — C3411 Malignant neoplasm of upper lobe, right bronchus or lung: Secondary | ICD-10-CM | POA: Insufficient documentation

## 2016-03-05 DIAGNOSIS — F419 Anxiety disorder, unspecified: Secondary | ICD-10-CM | POA: Diagnosis not present

## 2016-03-05 DIAGNOSIS — F40240 Claustrophobia: Secondary | ICD-10-CM | POA: Diagnosis not present

## 2016-03-05 DIAGNOSIS — C7952 Secondary malignant neoplasm of bone marrow: Secondary | ICD-10-CM

## 2016-03-05 DIAGNOSIS — Z66 Do not resuscitate: Secondary | ICD-10-CM

## 2016-03-05 DIAGNOSIS — C3492 Malignant neoplasm of unspecified part of left bronchus or lung: Secondary | ICD-10-CM | POA: Diagnosis not present

## 2016-03-05 NOTE — Progress Notes (Signed)
Please see the Nurse Progress Note in the MD Initial Consult Encounter for this patient. 

## 2016-03-05 NOTE — Progress Notes (Signed)
Radiation Oncology         [phone] ________________________________  Name: Jasmine RODELL MRN: [id number]  Date: 03/05/2016  DOB: Nov 29, 1955  Follow-Up Visit Note  CC: Reginia Naas, MD  [name], MD  Diagnosis:    Oncology History   Adenocarcinoma of upper lobe of right lung   Staging form: Lung, AJCC 6th Edition     Clinical stage from 12/29/2013: Stage IV (T4, N3, M1) - Signed by [name], MD on 12/29/2013       Adenocarcinoma of upper lobe of right lung   12/13/2013 - 12/17/2013 Hospital Admission    She was admitted to the hospital for workup of severe pain and neurological deficit and was found to have 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      12/13/2013 Imaging    CT scan show large mass in the right lung apex likely representing primary lung carcinoma or large metastasis. Metastases demonstrated in the pretracheal lymph nodes, T1 vertebral body and possibly sacrum.      12/14/2013 Initial Diagnosis    Adenocarcinoma of upper lobe of right lung      12/14/2013 Pathology Results    Accession: [id number] biopsy from right upper lobe showed adenocarcinoma. [facility] testing was positive for several mutations without any known treatment options now      12/17/2013 Imaging    MRI of the head is negative.      12/23/2013 - 02/05/2014 Radiation Therapy    She received radiation therapy to the mediastinal mass and bone      12/28/2013 Imaging    PET/CT scan showed mediastinal mass, lymphadenopathy and T1 involvement.      01/01/2014 - 01/29/2014 Chemotherapy    Weekly carboplatin and Taxol were added      03/12/2014 Imaging    Repeat PET scan showed near complete response to treatment.      03/16/2014 - 05/28/2014 Chemotherapy    She started treatment with Alimta for maintenance      07/07/2014 Imaging    PET scan showed disease progression      07/15/2014 - 09/23/2014 Chemotherapy    She received palliative treatment  with Opdivo      08/13/2014 - 08/25/2014 Radiation Therapy    She completed palliative radiation therapy to the bone.      10/05/2014 Imaging    PET CT showed disease progression except for site of recent radiation therapy      10/13/2014 - 04/06/2015 Chemotherapy    She received Rx with weekly carbo/taxol, 2 weeks on 1 week off with Avastin every 3 weeks      12/15/2014 Imaging    PET scan showed positive response to Rx      01/18/2015 Imaging    Ct chest showed disease improvement      04/19/2015 Imaging    Ct chest diease progression      04/27/2015 - 12/07/2015 Chemotherapy    Treatment is switched to Gemzar and Navelbine      05/04/2015 Adverse Reaction    Treatment was missed & interrupted due to recurrent UTI      06/13/2015 Imaging    MRI head is negative      08/17/2015 Imaging    CT imaging showed positive response to Rx      09/15/2015 - 09/17/2015 Hospital Admission    She was admitted to the hospital due to diagnosis of acute pulmonary embolism      09/15/2015 Imaging  Ct angiogram showed acute PE      01/03/2016 Imaging    Interval progression of bilateral pulmonary nodules suggesting metastatic progression. 2. Subcarinal lymphadenopathy seen previously has decreased in the interval. 3. Thoracoabdominal aortic atherosclerosis. 4. Stable sclerotic changes in C7 and T1, potentially from prior radiation treatment although prior metastatic disease is a consideration.        Narrative:  The patient returns today for a re-consultation. In summary this is age woman with a history of stage IV lung cancer, adenocarcinoma who was originally diagnosed with bony disease and lung disease in November 2015. She's been on multiple courses of treatment including chemotherapy, and palliative radiotherapy to the mediastinal mass, and cervical spine. She's even then re-irradiated, in 2016 to the C-spine. She was offered hospice and palliative care options, but has declined at this  point. She is considering other palliative systemic option but does anticipate that she will not she is these routes. The patient had an MRI of the thoracic spine for worsening back pain on 02/29/16. This noted metastatic disease affecting the T1 vertebral body, extensive metastatic disease affecting the C3 and C4 vertebral bodies with extension into the posterior elements on the left and suspicion of at least some canal compromise, and suspicion of patchy metastatic disease affecting the C6 and C7 vertebral bodies. she comes today to discuss whether or not there any additional options for radiation to the spine given her previous treatment.    On review of systems, the patient denies weakness of her extremities. She has back pain for which she is taking Dilaudid (2 mg every 4 hours), Oxycontin (10 mg every 12 hours), and Prednisone (10 mg daily). She has numbness of her fingers and difficulty breathing while lying flat on her back. She also reports edema of her lower extremities.She denies any chest pain, shortness of breath, cough, fevers, chills, night sweats, unintended weight changes. She denies any bowel or bladder disturbances, and denies abdominal pain, nausea or vomiting. She denies any new musculoskeletal or joint aches or pains, new skin lesions or concerns. A complete review of systems is obtained and is otherwise negative.   Previous Radiotherapy: Yes  08/11/2014 through 08/25/2014:  The patient was treated to the C3-C5 vertebral bodies to a dose of 30 gray in 10 fractions at 3 gray per fraction using a 2 field technique.  12/16/2013 through 02/05/2014: The patient was treated to the gross disease in the right upper chest/mediastinum. She initially was treated to a dose of 10 gray in 5 fractions using AP and PA fields. The patient then received an additional 56 gray using a IMRT technique. Daily image guidance was used. The patient's total dose was 66 gray.  Past Medical History:  Past  Medical History:  Diagnosis Date  . Arthritis 04/27/2014  . Drug-induced hypokalemia 05/26/2015  . Dysuria 11/03/2014  . Essential hypertension 04/27/2014  . Headache disorder 06/08/2015  . Hx of thyroid cancer    10 years ago  . Insomnia 12/29/2013  . Lung cancer ([place]) 12/14/13   Adenocarcinoma of right upper lobe mediastinal mass and bone   . 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 Panic attack   . S/P radiation therapy completed 12/16/13-02/05/14   RULL lung  . Thyroid ca [facility]) dx'd 20+yrs ago   surg only  . UTI (lower urinary tract infection) 05/26/2015  . UTI (urinary tract infection) 04/29/2015    Past Surgical History: Past Surgical History:  Procedure Laterality Date  . THYROIDECTOMY  Social History:  Social History   Social History  . Marital status: Legally Separated    Spouse name: N/A  . Number of children: N/A  . Years of education: N/A   Occupational History  . Not on file.   Social History Main Topics  . Smoking status: Former Smoker    Packs/day: 1.00    Years: 30.00    Types: Cigarettes    Quit date: 01/29/2009  . Smokeless tobacco: Never Used  . Alcohol use 0.0 oz/week  . Drug use: No  . Sexual activity: Not on file   Other Topics Concern  . Not on file   Social History Narrative  . No narrative on file    Family History: Family History  Problem Relation Age of Onset  . Heart disease Mother   . Heart disease Father   . Thyroid cancer Father      ALLERGIES:  is allergic to codeine; penicillins; and sulfa antibiotics.  Meds: Current Outpatient Prescriptions  Medication Sig Dispense Refill  . albuterol (PROVENTIL) (2.5 MG/3ML) 0.083% nebulizer solution Take 3 mLs (2.5 mg total) by nebulization every 4 (four) hours as needed for wheezing or shortness of breath. 300 mL 2  . albuterol (VENTOLIN HFA) 108 (90 Base) MCG/ACT inhaler Inhale 2 puffs into the lungs every 4 (four) hours as needed for wheezing or shortness of breath. 18 Inhaler 0  . ANORO  ELLIPTA 62.5-25 MCG/INH AEPB INHALE 1 PUFF INTO THE LUNGS DAILY. 60 each 6  . HYDROmorphone (DILAUDID) 2 MG tablet Take 1 tablet (2 mg total) by mouth every 4 (four) hours as needed for severe pain. 90 tablet 0  . ipratropium (ATROVENT) 0.02 % nebulizer solution Take 2.5 mLs (0.5 mg total) by nebulization every 6 (six) hours as needed for wheezing or shortness of breath. 75 mL 12  . levothyroxine (SYNTHROID, LEVOTHROID) 200 MCG tablet TAKE 1 TABLET BY MOUTH DAILY BEFORE BREAKFAST. 90 tablet 0  . lidocaine (LIDODERM) 5 % Place 1 patch onto the skin daily. Remove & Discard patch within 12 hours or as directed by MD 30 patch 1  . LORazepam (ATIVAN) 0.5 MG tablet TAKE 1 TABLET BY MOUTH EVERY 8 HOURS AS NEEDED FOR ANXIETY OR FOR NAUSEA 90 tablet 0  . ondansetron (ZOFRAN) 8 MG tablet TAKE 1 TABLET BY MOUTH EVERY 6 HOURS AS NEEDED FOR NAUSEA. 90 tablet 0  . predniSONE (DELTASONE) 20 MG tablet TAKE 1 TABLET BY MOUTH DAILY WITH BREAKFAST. (Patient taking differently: 10 MG  BY MOUTH DAILY WITH BREAKFAST.) 30 tablet 0  . ranitidine (ZANTAC) 150 MG tablet TAKE 1 TABLET (150 MG TOTAL) BY MOUTH EVERY EVENING. 90 tablet 2  . rivaroxaban (XARELTO) 20 MG TABS tablet Take 1 tablet (20 mg total) by mouth daily with supper. 30 tablet 9  . temazepam (RESTORIL) 15 MG capsule Take 1 capsule (15 mg total) by mouth at bedtime as needed for sleep. 30 capsule 1  . amLODipine (NORVASC) 10 MG tablet Take 1 tablet (10 mg total) by mouth at bedtime. (Patient not taking: Reported on 03/05/2016) 90 tablet 2  . hydrochlorothiazide (HYDRODIURIL) 25 MG tablet TAKE 1 TABLET BY MOUTH EVERY DAY (Patient not taking: Reported on 03/05/2016) 30 tablet 1  . lisinopril (PRINIVIL) 10 MG tablet Take 1 tablet (10 mg total) by mouth at bedtime. (Patient not taking: Reported on 03/05/2016) 30 tablet 9  . mirtazapine (REMERON) 15 MG tablet TAKE 1 TABLET (15 MG TOTAL) BY MOUTH AT BEDTIME AS NEEDED (FOR SLEEP.). (Patient  not taking: Reported on 02/10/2016) 90  tablet 6  . oxyCODONE (OXYCONTIN) 10 mg 12 hr tablet Take 1 tablet (10 mg total) by mouth every 12 (twelve) hours. (Patient not taking: Reported on 03/05/2016) 20 tablet 0  . prochlorperazine (COMPAZINE) 10 MG tablet TAKE 1 TABLET BY MOUTH EVERY 6 (SIX) HOURS AS NEEDED FOR NAUSEA OR VOMITING. (Patient not taking: Reported on 03/05/2016) 30 tablet 1   No current facility-administered medications for this encounter.    Facility-Administered Medications Ordered in Other Encounters  Medication Dose Route Frequency Provider Last Rate Last Dose  . sodium chloride 0.9 % injection 10 mL  10 mL Intravenous PRN [name], MD   10 mL at 02/06/16 1003  . sodium chloride 0.9 % injection 10 mL  10 mL Intravenous PRN [name], MD   10 mL at 02/29/16 1104    Physical Findings:  height is 6' 2"  (1.88 m) and weight is 237 lb 9.6 oz (107.8 kg). Her oral temperature is 99.1 F (37.3 C). Her blood pressure is 161/113 (abnormal) and her pulse is 78. Her respiration is 20 and oxygen saturation is 97%.   In general this is a well appearing Caucasian female in no acute distress. She is very anxious however, and tearful during her visit. Otherwise she is alert and oriented x4 and appropriate throughout the examination. HEENT reveals that the patient is normocephalic, atraumatic. EOMs are intact. PERRLA. Skin is intact without any evidence of gross lesions. Cardiovascular exam reveals a regular rate and rhythm, no clicks rubs or murmurs are auscultated. Chest is clear to auscultation bilaterally. Lymphatic assessment is performed and does not reveal any adenopathy in the cervical, supraclavicular, axillary, or inguinal chains. Abdomen has active bowel sounds in all quadrants and is intact. The abdomen is soft, non tender, non distended. Lower extremities are negative for pretibial pitting edema, deep calf tenderness, cyanosis or clubbing. She has intact sensory and motor function of bilateral upper and lower extremities. No  gross deficits are seen, and no reproducible pain is noted with palpation of the cervical or thoracic spine.    Lab Findings: Lab Results  Component Value Date   WBC 8.3 02/10/2016   HGB 11.2 (L) 02/10/2016   HCT 37.6 02/10/2016   MCV 98.9 02/10/2016   PLT 264 02/10/2016     Radiographic Findings: Ct Head Wo Contrast  Result Date: 02/10/2016 CLINICAL DATA:  Headache.  History of lung cancer. EXAM: CT HEAD WITHOUT CONTRAST TECHNIQUE: Contiguous axial images were obtained from the base of the skull through the vertex without intravenous contrast. COMPARISON:  Head CT 10/12/2015 FINDINGS: Brain: No mass lesion, intraparenchymal hemorrhage or extra-axial collection. No evidence of acute cortical infarct. Brain parenchyma and CSF-containing spaces are normal for age. Vascular: No hyperdense vessel or unexpected calcification. Skull: Normal visualized skull base, calvarium and extracranial soft tissues. Sinuses/Orbits: No sinus fluid levels or advanced mucosal thickening. No mastoid effusion. Normal orbits. IMPRESSION: Normal head CT. Electronically Signed   By: Ulyses Jarred M.D.   On: 02/10/2016 01:41   Mr Thoracic Spine W Wo Contrast  Result Date: 02/29/2016 CLINICAL DATA:  Metastatic lung cancer.  Worsening back pain. EXAM: MRI THORACIC SPINE WITHOUT AND WITH CONTRAST TECHNIQUE: Multiplanar and multiecho pulse sequences of the thoracic spine were obtained without and with intravenous contrast. CONTRAST:  3m MULTIHANCE GADOBENATE DIMEGLUMINE 529 MG/ML IV SOLN COMPARISON:  CT chest 01/03/2016 FINDINGS: MRI THORACIC SPINE FINDINGS Scout view in the cervical spine shows advanced metastatic disease affecting the C3  and C4 levels with extension into the posterior elements. There is probably encroachment upon the spinal canal. This level was not studied in detail. Cervical spine exam suggested. I think there is patchy metastatic disease within the C6 and C7 vertebral bodies. Abnormal signal throughout  the T1 vertebral body is consist with metastatic disease. Mild posterior bowing of the posterior margin of the vertebral body indents the thecal sac slightly but does not cause neural compression. No metastatic disease seen the low T2. T1 hyperintense marrow in the upper thoracic region is consistent with previous radiation. Chronic degenerative marrow changes anteriorly at T8-9. Collapse and/or tumor again evident in the right upper lung. No evidence of metastatic disease to the cord itself. Ordinary degenerative changes are present throughout the lumbar region. There shallow disc herniations at T6-7 and T7-8 but no visible neural compression. IMPRESSION: Metastatic disease affecting the T1 vertebral body. Early extraosseous disease encroaching upon the ventral aspect of the spinal canal. No cord compression at this time. Extensive metastatic disease affecting the C3 and C4 vertebral bodies with extension into the posterior elements on the left. Suspicion of at least some canal compromise. This level was not studied in detail. Complete imaging of the cervical spine would be suggested to understand that anatomy better. Suspicion of patchy metastatic disease affecting the C6 and C7 vertebral bodies. These results will be called to the ordering clinician or representative by the Radiologist Assistant, and communication documented in the PACS or zVision Dashboard. Electronically Signed   By: Nelson Chimes M.D.   On: 02/29/2016 15:03    Impression/Plan:  1. Recurrent metastatic Stage IV (T4, N3, M1) adenocarcinoma of the right upper lung with metastases of the spine. Dr. Lisbeth Renshaw reviews her imaging studies as well as her previous dosimetry plans. The patient has been treated in the right apex which overlies T1. The patient has also been treated previously for C3-5. The patient has progressive disease in T1 and is a candidate for radiosurgery to this area. We will proceed with MRI imaging of the thoracic spine to  confirm no additional areas need to be considered for treatment. As her symptoms seem to be involving the lower cervical spine as well, Dr. Lisbeth Renshaw recommends proceeding with MRI of the cervical spine. As she is claustrophobic, we will ask the MRI team to do her thoracic imaging first, and if she is able to tolerate the cervical spine imaging we would then proceed with this. This may assist in determining if there is any additional level for radiotherapy higher up. We will move forward with simulation as well, and our brain and spine navigator has been aware of her case. Dr. Lisbeth Renshaw recommends the course of one fraction in the setting, and we will inform her of the results of her MRI when available. She was given a prescription for lorazepam for anxiety due to her claustrophobia when having the procedure. She will also continue to take Dilaudid and add Tylenol as needed. 2. Goals of care and anxiety regarding her medical diagnosis. The patient has met with the palliative care nurse practitioner today which I believe will be a very meaningful encounter. We discussed that her pain management might be better syrup   Carola Rhine, PAC  This document serves as a record of services personally performed by Shona Simpson, PA-C and Kyung Rudd, MD. It was created on their behalf by Darcus Austin, a trained medical scribe. The creation of this record is based on the scribe's personal observations and the  providers' statements to them. This document has been checked and approved by the attending provider.

## 2016-03-05 NOTE — Consult Note (Signed)
    This NP Wadie Lessen reviewed medical records, received report from team,  and then meet with the patient and her ex-husband/ main support person in the OP Radiation-Oncology clinic  to discuss diagnosis, prognosis, GOC, and options.  A detailed discussion was had today regarding advanced directives.  Concepts specific to code status, artifical feeding and hydration, continued IV antibiotics and rehospitalization was had.  The difference between a aggressive medical intervention path  and a palliative comfort care path for this patient at this time was had.  Values and goals of care important to patient and family were attempted to be elicited.  MOST form completed.   DNR inplace  Concept of Hospice and Palliative Care were discussed  Patient tells me that someone from a hospice came and spoke to her, she did not contact with anyone at this point .  She shared her hope to access a hospice facility when EOL is close.  We discussed the various hospices in her area who are associated with a free standing hospice facility.  She is given the numbers for [facility] and [facility].  She is hopeful for hospice services   Natural trajectory and expectations at EOL were discussed.  Questions and concerns addressed.   Family encouraged to call with questions or concerns.  Her pain management is handled by Dr Alvy Bimler at [facility] time until hospcie services are initiated in home.   We discussed a trial Tylenol 650 mg po tid as augmentation to her prescribed Dilaudid.  She plans to continue with the recommended radiation treatments for palliation.   She details her h/o anxiety.  She is currently utilizing low dose ativan as prescribed by her oncologist.  We discussed breathing techniques and medication  recommendations   Ms Mchaney is encouraged to call with questions or concerns.   Wadie Lessen NP  Palliative Medicine Team Team Phone # [phone] Pager [phone]   Greater than 50%  of this time was  spent counseling and coordinating care related to the above assessment and plan. Time In Time Out Total Time Spent with Patient    1530 1615 75 min     Discussed with Dr Lisbeth Renshaw

## 2016-03-06 ENCOUNTER — Telehealth: Payer: Self-pay

## 2016-03-06 ENCOUNTER — Other Ambulatory Visit: Payer: Self-pay | Admitting: Radiation Therapy

## 2016-03-06 DIAGNOSIS — C7949 Secondary malignant neoplasm of other parts of nervous system: Principal | ICD-10-CM

## 2016-03-06 DIAGNOSIS — C7931 Secondary malignant neoplasm of brain: Secondary | ICD-10-CM

## 2016-03-06 NOTE — Telephone Encounter (Signed)
Ad[place]em to below note, computer cut off. Dr. Alvy Bimler would be glad to send a referral. Instructed patient to call back with information and referral can be forwarded.

## 2016-03-06 NOTE — Telephone Encounter (Signed)
Called patient back, she had left a message that she wondered if she should go to [facility], and wanted to get Dr. Alvy Bimler opinion. Relayed to patient that Dr. Alvy Bimler with be glad to send a referral t

## 2016-03-07 ENCOUNTER — Telehealth: Payer: Self-pay

## 2016-03-07 NOTE — Telephone Encounter (Signed)
Patient requesting her exact diagnosis for her possible referral. She cannot remember what type of gene problem she has to research she said.

## 2016-03-07 NOTE — Progress Notes (Signed)
Does patient have an allergy to IV contrast dye?: No.   Has patient ever received premedication for IV contrast dye?: No.   Does patient take metformin?: No.  If patient does take metformin when was the last dose: Not a diabetic  Date of lab work: 02/10/16 BUN: 17 CR: 0.87  IV site:  Right power port

## 2016-03-08 NOTE — Telephone Encounter (Signed)
Returned call to pt, informed her of Dr. Calton Dach recommendation below. She will call Dr. Karene Fry for follow up appointment.

## 2016-03-08 NOTE — Telephone Encounter (Signed)
She saw a second opinion at [facility] before She should just call him for follow-up appt

## 2016-03-12 ENCOUNTER — Other Ambulatory Visit: Payer: Self-pay | Admitting: *Deleted

## 2016-03-12 MED ORDER — HYDROMORPHONE HCL 2 MG PO TABS
2.0000 mg | ORAL_TABLET | ORAL | 0 refills | Status: DC | PRN
Start: 2016-03-12 — End: 2016-03-21

## 2016-03-14 ENCOUNTER — Other Ambulatory Visit: Payer: BLUE CROSS/BLUE SHIELD

## 2016-03-14 ENCOUNTER — Inpatient Hospital Stay: Admission: RE | Admit: 2016-03-14 | Payer: BLUE CROSS/BLUE SHIELD | Source: Ambulatory Visit

## 2016-03-14 ENCOUNTER — Ambulatory Visit
Admission: RE | Admit: 2016-03-14 | Discharge: 2016-03-14 | Disposition: A | Discharge status: Home / Self Care | Payer: BLUE CROSS/BLUE SHIELD | Source: Ambulatory Visit | Attending: Radiation Oncology | Admitting: Radiation Oncology

## 2016-03-15 ENCOUNTER — Other Ambulatory Visit: Payer: Self-pay | Admitting: *Deleted

## 2016-03-15 ENCOUNTER — Other Ambulatory Visit: Payer: Self-pay | Admitting: Hematology and Oncology

## 2016-03-15 ENCOUNTER — Ambulatory Visit
Admission: RE | Admit: 2016-03-15 | Payer: BLUE CROSS/BLUE SHIELD | Source: Ambulatory Visit | Admitting: Radiation Oncology

## 2016-03-15 ENCOUNTER — Telehealth: Payer: Self-pay | Admitting: *Deleted

## 2016-03-15 DIAGNOSIS — C3492 Malignant neoplasm of unspecified part of left bronchus or lung: Secondary | ICD-10-CM | POA: Insufficient documentation

## 2016-03-15 MED ORDER — LORAZEPAM 0.5 MG PO TABS: ORAL_TABLET | ORAL | 0 refills | Status: DC

## 2016-03-15 NOTE — Telephone Encounter (Signed)
Jasmine Grant, can you ask her what she wants? I have prescribed various pain medications in the past and she always refused them because they "made her sick". I can double dose on Dilaudid or add long acting morphine

## 2016-03-15 NOTE — Telephone Encounter (Signed)
Patient states that she would prefer doubling up on the dilaudid. She would like to take 4 mg every 2 hours.

## 2016-03-15 NOTE — Telephone Encounter (Signed)
I just signed a script for 90 tabs for 2 mg Tell her she has my permission to double to 4 mg

## 2016-03-15 NOTE — Telephone Encounter (Signed)
Patient states that pain medication (diladid) is only lasting for 1.5 hours. Patient wants to know if she can get an alternative or stronger dose to last her until the MRI scan (March 13th)

## 2016-03-16 ENCOUNTER — Encounter (HOSPITAL_COMMUNITY): Payer: Self-pay

## 2016-03-16 ENCOUNTER — Inpatient Hospital Stay (HOSPITAL_COMMUNITY)
Admission: EM | Admit: 2016-03-16 | Discharge: 2016-03-21 | DRG: 543 | Disposition: A | Discharge status: Home / Self Care | Payer: BLUE CROSS/BLUE SHIELD | Attending: Family Medicine | Admitting: Family Medicine

## 2016-03-16 DIAGNOSIS — Z923 Personal history of irradiation: Secondary | ICD-10-CM

## 2016-03-16 DIAGNOSIS — F40240 Claustrophobia: Secondary | ICD-10-CM | POA: Diagnosis not present

## 2016-03-16 DIAGNOSIS — I2699 Other pulmonary embolism without acute cor pulmonale: Secondary | ICD-10-CM | POA: Diagnosis not present

## 2016-03-16 DIAGNOSIS — G893 Neoplasm related pain (acute) (chronic): Secondary | ICD-10-CM | POA: Diagnosis present

## 2016-03-16 DIAGNOSIS — Z515 Encounter for palliative care: Secondary | ICD-10-CM | POA: Diagnosis not present

## 2016-03-16 DIAGNOSIS — Z86711 Personal history of pulmonary embolism: Secondary | ICD-10-CM | POA: Diagnosis not present

## 2016-03-16 DIAGNOSIS — E038 Other specified hypothyroidism: Secondary | ICD-10-CM | POA: Diagnosis present

## 2016-03-16 DIAGNOSIS — G629 Polyneuropathy, unspecified: Secondary | ICD-10-CM | POA: Diagnosis present

## 2016-03-16 DIAGNOSIS — Z85850 Personal history of malignant neoplasm of thyroid: Secondary | ICD-10-CM

## 2016-03-16 DIAGNOSIS — F419 Anxiety disorder, unspecified: Secondary | ICD-10-CM | POA: Diagnosis not present

## 2016-03-16 DIAGNOSIS — Z66 Do not resuscitate: Secondary | ICD-10-CM | POA: Diagnosis present

## 2016-03-16 DIAGNOSIS — R52 Pain, unspecified: Secondary | ICD-10-CM | POA: Diagnosis not present

## 2016-03-16 DIAGNOSIS — M4850XS Collapsed vertebra, not elsewhere classified, site unspecified, sequela of fracture: Secondary | ICD-10-CM | POA: Diagnosis not present

## 2016-03-16 DIAGNOSIS — C7802 Secondary malignant neoplasm of left lung: Secondary | ICD-10-CM | POA: Diagnosis present

## 2016-03-16 DIAGNOSIS — Z8249 Family history of ischemic heart disease and other diseases of the circulatory system: Secondary | ICD-10-CM | POA: Diagnosis not present

## 2016-03-16 DIAGNOSIS — C7951 Secondary malignant neoplasm of bone: Secondary | ICD-10-CM | POA: Diagnosis present

## 2016-03-16 DIAGNOSIS — Z9221 Personal history of antineoplastic chemotherapy: Secondary | ICD-10-CM | POA: Diagnosis not present

## 2016-03-16 DIAGNOSIS — Z79899 Other long term (current) drug therapy: Secondary | ICD-10-CM | POA: Diagnosis not present

## 2016-03-16 DIAGNOSIS — Z7901 Long term (current) use of anticoagulants: Secondary | ICD-10-CM | POA: Diagnosis not present

## 2016-03-16 DIAGNOSIS — M542 Cervicalgia: Secondary | ICD-10-CM

## 2016-03-16 DIAGNOSIS — Z87891 Personal history of nicotine dependence: Secondary | ICD-10-CM

## 2016-03-16 DIAGNOSIS — C3492 Malignant neoplasm of unspecified part of left bronchus or lung: Secondary | ICD-10-CM | POA: Diagnosis present

## 2016-03-16 DIAGNOSIS — Z510 Encounter for antineoplastic radiation therapy: Secondary | ICD-10-CM | POA: Diagnosis present

## 2016-03-16 DIAGNOSIS — J449 Chronic obstructive pulmonary disease, unspecified: Secondary | ICD-10-CM | POA: Diagnosis present

## 2016-03-16 DIAGNOSIS — C3490 Malignant neoplasm of unspecified part of unspecified bronchus or lung: Secondary | ICD-10-CM

## 2016-03-16 DIAGNOSIS — C3411 Malignant neoplasm of upper lobe, right bronchus or lung: Secondary | ICD-10-CM | POA: Diagnosis present

## 2016-03-16 DIAGNOSIS — C7801 Secondary malignant neoplasm of right lung: Secondary | ICD-10-CM | POA: Diagnosis present

## 2016-03-16 DIAGNOSIS — I10 Essential (primary) hypertension: Secondary | ICD-10-CM | POA: Diagnosis present

## 2016-03-16 DIAGNOSIS — M8458XA Pathological fracture in neoplastic disease, other specified site, initial encounter for fracture: Principal | ICD-10-CM | POA: Diagnosis present

## 2016-03-16 DIAGNOSIS — M4850XD Collapsed vertebra, not elsewhere classified, site unspecified, subsequent encounter for fracture with routine healing: Secondary | ICD-10-CM | POA: Diagnosis not present

## 2016-03-16 DIAGNOSIS — Z5189 Encounter for other specified aftercare: Secondary | ICD-10-CM | POA: Diagnosis not present

## 2016-03-16 DIAGNOSIS — Z7189 Other specified counseling: Secondary | ICD-10-CM | POA: Diagnosis not present

## 2016-03-16 DIAGNOSIS — M4850XA Collapsed vertebra, not elsewhere classified, site unspecified, initial encounter for fracture: Secondary | ICD-10-CM | POA: Diagnosis present

## 2016-03-16 DIAGNOSIS — Z808 Family history of malignant neoplasm of other organs or systems: Secondary | ICD-10-CM | POA: Diagnosis not present

## 2016-03-16 DIAGNOSIS — J329 Chronic sinusitis, unspecified: Secondary | ICD-10-CM | POA: Diagnosis present

## 2016-03-16 LAB — CBC WITH DIFFERENTIAL/PLATELET
BASOS ABS: 0 10*3/uL (ref 0.0–0.1)
BASOS PCT: 0 %
EOS ABS: 0 10*3/uL (ref 0.0–0.7)
Eosinophils Relative: 0 %
HEMATOCRIT: 40.4 % (ref 36.0–46.0)
HEMOGLOBIN: 12.5 g/dL (ref 12.0–15.0)
Lymphocytes Relative: 18 %
Lymphs Abs: 1.4 10*3/uL (ref 0.7–4.0)
MCH: 28.5 pg (ref 26.0–34.0)
MCHC: 30.9 g/dL (ref 30.0–36.0)
MCV: 92 fL (ref 78.0–100.0)
Monocytes Absolute: 0.6 10*3/uL (ref 0.1–1.0)
Monocytes Relative: 8 %
NEUTROS ABS: 5.7 10*3/uL (ref 1.7–7.7)
NEUTROS PCT: 74 %
Platelets: 215 10*3/uL (ref 150–400)
RBC: 4.39 MIL/uL (ref 3.87–5.11)
RDW: 15.2 % (ref 11.5–15.5)
WBC: 7.7 10*3/uL (ref 4.0–10.5)

## 2016-03-16 LAB — BASIC METABOLIC PANEL
ANION GAP: 9 (ref 5–15)
BUN: 11 mg/dL (ref 6–20)
CHLORIDE: 107 mmol/L (ref 101–111)
CO2: 24 mmol/L (ref 22–32)
CREATININE: 0.96 mg/dL (ref 0.44–1.00)
Calcium: 9.1 mg/dL (ref 8.9–10.3)
GFR calc non Af Amer: >60 mL/min (ref >60)
Glucose, Bld: 154 mg/dL — ABNORMAL HIGH (ref 65–99)
Potassium: 3.8 mmol/L (ref 3.5–5.1)
SODIUM: 140 mmol/L (ref 135–145)

## 2016-03-16 MED ORDER — ACETAMINOPHEN 325 MG PO TABS
650.0000 mg | ORAL_TABLET | Freq: Four times a day (QID) | ORAL | Status: DC | PRN
  Administered 2016-03-20 – 2016-03-21 (×2): 650 mg via ORAL
  Filled 2016-03-16 (×2): qty 2

## 2016-03-16 MED ORDER — LORAZEPAM 2 MG/ML IJ SOLN
1.0000 mg | Freq: Once | INTRAMUSCULAR | Status: AC
  Administered 2016-03-16: 1 mg via INTRAVENOUS
  Filled 2016-03-16: qty 1

## 2016-03-16 MED ORDER — PROMETHAZINE HCL 25 MG/ML IJ SOLN
12.5000 mg | Freq: Four times a day (QID) | INTRAMUSCULAR | Status: DC | PRN
  Administered 2016-03-18: 12.5 mg via INTRAVENOUS
  Filled 2016-03-16: qty 1

## 2016-03-16 MED ORDER — POTASSIUM CHLORIDE IN NACL 20-0.9 MEQ/L-% IV SOLN
INTRAVENOUS | Status: AC
  Administered 2016-03-16: 19:00:00 via INTRAVENOUS
  Filled 2016-03-16: qty 1000

## 2016-03-16 MED ORDER — HYDROMORPHONE HCL 1 MG/ML IJ SOLN
1.0000 mg | Freq: Once | INTRAMUSCULAR | Status: AC
  Administered 2016-03-16: 1 mg via INTRAVENOUS
  Filled 2016-03-16: qty 1

## 2016-03-16 MED ORDER — SODIUM CHLORIDE 0.9% FLUSH
10.0000 mL | INTRAVENOUS | Status: DC | PRN
  Administered 2016-03-18 – 2016-03-19 (×2): 10 mL
  Filled 2016-03-16 (×2): qty 40

## 2016-03-16 MED ORDER — LEVOTHYROXINE SODIUM 100 MCG PO TABS
200.0000 ug | ORAL_TABLET | Freq: Every day | ORAL | Status: DC
  Administered 2016-03-17 – 2016-03-21 (×5): 200 ug via ORAL
  Filled 2016-03-16 (×5): qty 2

## 2016-03-16 MED ORDER — PREDNISONE 10 MG PO TABS
10.0000 mg | ORAL_TABLET | Freq: Every day | ORAL | Status: DC
  Administered 2016-03-17 – 2016-03-21 (×4): 10 mg via ORAL
  Filled 2016-03-16 (×5): qty 1

## 2016-03-16 MED ORDER — LIDOCAINE 5 % EX PTCH
1.0000 | MEDICATED_PATCH | CUTANEOUS | Status: DC
  Administered 2016-03-16 – 2016-03-20 (×5): 1 via TRANSDERMAL
  Filled 2016-03-16 (×5): qty 1

## 2016-03-16 MED ORDER — SODIUM CHLORIDE 0.9 % IV SOLN: INTRAVENOUS | Status: DC

## 2016-03-16 MED ORDER — TEMAZEPAM 15 MG PO CAPS: 15.0000 mg | ORAL_CAPSULE | Freq: Every evening | ORAL | Status: DC | PRN

## 2016-03-16 MED ORDER — OXYCODONE HCL ER 10 MG PO T12A
10.0000 mg | EXTENDED_RELEASE_TABLET | Freq: Two times a day (BID) | ORAL | Status: DC
  Administered 2016-03-16 – 2016-03-18 (×4): 10 mg via ORAL
  Filled 2016-03-16 (×5): qty 1

## 2016-03-16 MED ORDER — HYDROMORPHONE HCL 1 MG/ML IJ SOLN
1.0000 mg | INTRAMUSCULAR | Status: DC | PRN
Start: 2016-03-16 — End: 2016-03-18
  Administered 2016-03-16 – 2016-03-18 (×8): 1 mg via INTRAVENOUS
  Filled 2016-03-16 (×8): qty 1

## 2016-03-16 MED ORDER — ONDANSETRON HCL 4 MG PO TABS
4.0000 mg | ORAL_TABLET | Freq: Four times a day (QID) | ORAL | Status: DC | PRN
Start: 2016-03-16 — End: 2016-03-17

## 2016-03-16 MED ORDER — GUAIFENESIN ER 600 MG PO TB12
600.0000 mg | ORAL_TABLET | Freq: Two times a day (BID) | ORAL | Status: DC
  Administered 2016-03-16 – 2016-03-21 (×10): 600 mg via ORAL
  Filled 2016-03-16 (×10): qty 1

## 2016-03-16 MED ORDER — ALBUTEROL SULFATE (2.5 MG/3ML) 0.083% IN NEBU: 2.5000 mg | INHALATION_SOLUTION | RESPIRATORY_TRACT | Status: DC | PRN

## 2016-03-16 MED ORDER — ACETAMINOPHEN 650 MG RE SUPP: 650.0000 mg | Freq: Four times a day (QID) | RECTAL | Status: DC | PRN

## 2016-03-16 MED ORDER — ONDANSETRON HCL 4 MG/2ML IJ SOLN
4.0000 mg | Freq: Four times a day (QID) | INTRAMUSCULAR | Status: DC | PRN
  Administered 2016-03-16 – 2016-03-17 (×4): 4 mg via INTRAVENOUS
  Filled 2016-03-16 (×4): qty 2

## 2016-03-16 MED ORDER — LORAZEPAM 0.5 MG PO TABS
0.5000 mg | ORAL_TABLET | Freq: Once | ORAL | Status: AC
  Administered 2016-03-16: 0.5 mg via ORAL
  Filled 2016-03-16: qty 1

## 2016-03-16 MED ORDER — UMECLIDINIUM-VILANTEROL 62.5-25 MCG/INH IN AEPB
1.0000 | INHALATION_SPRAY | Freq: Every day | RESPIRATORY_TRACT | Status: DC
  Administered 2016-03-17 – 2016-03-21 (×5): 1 via RESPIRATORY_TRACT
  Filled 2016-03-16: qty 14

## 2016-03-16 MED ORDER — RIVAROXABAN 20 MG PO TABS
20.0000 mg | ORAL_TABLET | Freq: Every day | ORAL | Status: DC
  Administered 2016-03-16 – 2016-03-20 (×4): 20 mg via ORAL
  Filled 2016-03-16 (×4): qty 1

## 2016-03-16 MED ORDER — LORAZEPAM 0.5 MG PO TABS
0.5000 mg | ORAL_TABLET | Freq: Four times a day (QID) | ORAL | Status: DC | PRN
  Administered 2016-03-17 – 2016-03-19 (×5): 0.5 mg via ORAL
  Filled 2016-03-16 (×5): qty 1

## 2016-03-16 MED ORDER — FAMOTIDINE 20 MG PO TABS
20.0000 mg | ORAL_TABLET | Freq: Two times a day (BID) | ORAL | Status: DC
  Administered 2016-03-16 – 2016-03-21 (×10): 20 mg via ORAL
  Filled 2016-03-16 (×10): qty 1

## 2016-03-16 MED ORDER — ONDANSETRON HCL 4 MG/2ML IJ SOLN
4.0000 mg | Freq: Once | INTRAMUSCULAR | Status: AC
  Administered 2016-03-16: 4 mg via INTRAVENOUS
  Filled 2016-03-16: qty 2

## 2016-03-16 NOTE — ED Provider Notes (Signed)
[facility] DEPT Provider Note   CSN: [id number] Arrival date & time: 03/16/16  1051     History   Chief Complaint Chief Complaint  Patient presents with  . Neck Pain  . Back Pain    HPI Jasmine Grant is a 61 y.o. female.  Patient presents to the emergency department with chief complaint of neck pain. 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 She is scheduled to have an MRI of her cervical spine in March, but states that she cannot tolerate pain. Per chart review she is a candidate for surgical resection of the metastases, at least in regards to the thoracic spine.  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  She denies and fevers or chills.  She takes PO dilaudid and oxycodone and uses fentanyl patches for pain control at home which are not working.   The history is provided by the patient. No language interpreter was used.    Past Medical History:  Diagnosis Date  . Arthritis 04/27/2014  . Drug-induced hypokalemia 05/26/2015  . Dysuria 11/03/2014  . Essential hypertension 04/27/2014  . Headache disorder 06/08/2015  . Hx of thyroid cancer    10 years ago  . Insomnia 12/29/2013  . Lung cancer ([place]) 12/14/13   Adenocarcinoma of right upper lobe mediastinal mass and bone   . 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 Panic attack   . S/P radiation therapy completed 12/16/13-02/05/14   RULL lung  . Thyroid ca [facility]) dx'd 20+yrs ago   surg only  . UTI (lower urinary tract infection) 05/26/2015  . UTI (urinary tract infection) 04/29/2015    Patient Active Problem List   Diagnosis Date Noted  . Lung cancer, primary, with metastasis from lung to other site, left ([place])   . Cancer associated pain 02/06/2016  . Atrial fibrillation, transient ([place]) 11/30/2015  . Goals of care, counseling/discussion 10/19/2015  . Atypical chest pain 10/05/2015  . Dizziness 10/05/2015  . Pulmonary embolism ([place])  09/15/2015  . Pulmonary embolus ([place]) 09/15/2015  . Radiation pneumonitis ([place]) 08/18/2015  . DNR (do not resuscitate) 08/18/2015  . Acute bronchitis due to infection 07/27/2015  . Leukocytosis 07/07/2015  . Headache disorder 06/08/2015  . UTI (lower urinary tract infection) 05/26/2015  . Drug-induced hypokalemia 05/26/2015  . Dehydration 05/26/2015  . Chemotherapy-induced nausea 05/26/2015  . Palliative care by specialist 05/18/2015  . Thrombocytopenia ([place]) 02/24/2015  . Other specified hypothyroidism 02/03/2015  . Numbness and tingling of right arm 01/12/2015  . Rash 11/26/2014  . COPD (chronic obstructive pulmonary disease) ([place]) 11/25/2014  . Pancytopenia due to antineoplastic chemotherapy ([place]) 11/10/2014  . Neuropathy due to chemotherapeutic drug ([place]) 11/10/2014  . Dysuria 11/03/2014  . Cough 10/06/2014  . 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 Protein calorie malnutrition ([place]) 08/26/2014  . Elevated serum creatinine 08/26/2014  . Right shoulder pain 06/17/2014  . 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 Arthritis 04/27/2014  . Bilateral leg edema 04/27/2014  . Sinusitis, chronic 04/27/2014  . Essential hypertension 04/27/2014  . Other constipation 04/27/2014  . Cellulitis 04/20/2014  . Anemia due to antineoplastic chemotherapy 03/16/2014  . Cyst of skin 02/26/2014  . Other fatigue 02/26/2014  . Leukopenia due to antineoplastic chemotherapy ([place]) 01/28/2014  . Radiation-induced dermatitis 01/28/2014  . Esophagitis 01/07/2014  . Hypersensitivity reaction 01/04/2014  . Insomnia 12/29/2013  . Constipation due to opioid therapy 12/17/2013  . Adenocarcinoma of upper lobe of  right lung   . Nausea with vomiting 12/16/2013  . Elevated blood pressure 12/15/2013  . Generalized anxiety disorder 12/14/2013  . Acquired hypothyroidism 12/14/2013  . Overweight (BMI 25.0-29.9) 12/14/2013  . Epidural mass 12/13/2013  . 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 Pathologic compression fracture of spine  [facility])     Past Surgical History:  Procedure Laterality Date  . THYROIDECTOMY      OB History    No data available       Home Medications    Prior to Admission medications   Medication Sig Start Date End Date Taking? Authorizing Provider  albuterol (PROVENTIL) (2.5 MG/3ML) 0.083% nebulizer solution Take 3 mLs (2.5 mg total) by nebulization every 4 (four) hours as needed for wheezing or shortness of breath. 10/28/15  Yes Collene Gobble, MD  albuterol (VENTOLIN HFA) 108 (90 Base) MCG/ACT inhaler Inhale 2 puffs into the lungs every 4 (four) hours as needed for wheezing or shortness of breath. 02/15/16  Yes [name], MD  guaiFENesin (MUCINEX) 600 MG 12 hr tablet Take 600 mg by mouth 2 (two) times daily.   Yes Historical Provider, MD  HYDROmorphone (DILAUDID) 2 MG tablet Take 1 tablet (2 mg total) by mouth every 4 (four) hours as needed for severe pain. 03/12/16  Yes [name], MD  levothyroxine (SYNTHROID, LEVOTHROID) 200 MCG tablet Take 200 mcg by mouth daily before breakfast.   Yes Historical Provider, MD  lidocaine (LIDODERM) 5 % Place 1 patch onto the skin daily. Remove & Discard patch within 12 hours or as directed by MD 01/27/16  Yes [name], MD  LORazepam (ATIVAN) 0.5 MG tablet Take 0.5 mg by mouth every 8 (eight) hours as needed for anxiety.   Yes Historical Provider, MD  ondansetron (ZOFRAN) 8 MG tablet Take 8 mg by mouth every 6 (six) hours as needed for nausea or vomiting.   Yes Historical Provider, MD  oxyCODONE (OXYCONTIN) 10 mg 12 hr tablet Take 10 mg by mouth every 12 (twelve) hours as needed (for pain).   Yes Historical Provider, MD  predniSONE (DELTASONE) 10 MG tablet Take 10 mg by mouth daily with breakfast.   Yes Historical Provider, MD  ranitidine (ZANTAC) 150 MG tablet Take 150 mg by mouth at bedtime.   Yes Historical Provider, MD  rivaroxaban (XARELTO) 20 MG TABS tablet Take 1 tablet (20 mg total) by mouth daily with supper. 10/04/15  Yes [name], MD  temazepam  (RESTORIL) 15 MG capsule Take 1 capsule (15 mg total) by mouth at bedtime as needed for sleep. 02/06/16  Yes [name], MD  umeclidinium-vilanterol (ANORO ELLIPTA) 62.5-25 MCG/INH AEPB Inhale 1 puff into the lungs daily.   Yes Historical Provider, MD    Family History Family History  Problem Relation Age of Onset  . Heart disease Mother   . Heart disease Father   . Thyroid cancer Father     Social History Social History  Substance Use Topics  . Smoking status: Former Smoker    Packs/day: 1.00    Years: 30.00    Types: Cigarettes    Quit date: 01/29/2009  . Smokeless tobacco: Never Used  . Alcohol use 0.0 oz/week     Allergies   Codeine; Penicillins; and Sulfa antibiotics   Review of Systems Review of Systems  All other systems reviewed and are negative.    Physical Exam Updated Vital Signs BP (!) 142/108   Pulse 72   Temp 97.5 F (36.4 C) (Oral)   Resp 13  Ht '6\' 2"'$  (1.88 m)   Wt 107.5 kg   SpO2 94%   BMI 30.43 kg/m   Physical Exam  Constitutional: She is oriented to person, place, and time. She appears well-developed and well-nourished.  HENT:  Head: Normocephalic and atraumatic.  Eyes: Conjunctivae and EOM are normal. Pupils are equal, round, and reactive to light.  Neck: Normal range of motion. Neck supple.  Cardiovascular: Normal rate and regular rhythm.  Exam reveals no gallop and no friction rub.   No murmur heard. Pulmonary/Chest: Effort normal and breath sounds normal. No respiratory distress. She has no wheezes. She has no rales. She exhibits no tenderness.  Abdominal: Soft. Bowel sounds are normal. She exhibits no distension and no mass. There is no tenderness. There is no rebound and no guarding.  Musculoskeletal: Normal range of motion. She exhibits no edema or tenderness.  ROM and strength of extremities is 5/5  Neurological: She is alert and oriented to person, place, and time.  Skin: Skin is warm and dry.  No rash or cellulitis  Psychiatric:  She has a normal mood and affect. Her behavior is normal. Judgment and thought content normal.  Nursing note and vitals reviewed.    ED Treatments / Results  Labs (all labs ordered are listed, but only abnormal results are displayed) Labs Reviewed  BASIC METABOLIC PANEL - Abnormal; Notable for the following:       Result Value   Glucose, Bld 154 (*)    All other components within normal limits  CBC WITH DIFFERENTIAL/PLATELET    EKG  EKG Interpretation None       Radiology No results found.  Procedures Procedures (including critical care time)  Medications Ordered in ED Medications  HYDROmorphone (DILAUDID) injection 1 mg (1 mg Intravenous Given 03/16/16 1150)  ondansetron (ZOFRAN) injection 4 mg (4 mg Intravenous Given 03/16/16 1150)     Initial Impression / Assessment and Plan / ED Course  I have reviewed the triage vital signs and the nursing notes.  Pertinent labs & imaging results that were available during my care of the patient were reviewed by me and considered in my medical decision making (see chart for details).     Patient with metastatic lung cancer to see and T-spine. She has an MRI scheduled for her cervical spine in about a month, but she states that she cannot tolerate the pain. She has been taking home medications which include Dilaudid, oxycodone, and fentanyl patches, but states that they do not help. Asian states that she needs to get the cervical spine MRI in order to see if she is a candidate for radiosurgery of the cervical spine as well as the thoracic spine. She states that the delay has been because she requires sedation due to claustrophobia.  I discussed the case with Dr. Alvy Bimler of hematology/oncology who states that admission is reasonable given that the patient is strongly difficult to control her pain. She also recommends that the patient get a palliative care consult in the hospital.  Appreciate Dr. Carles Collet from [facility], who will admit the patient  for pain control.  Final Clinical Impressions(s) / ED Diagnoses   Final diagnoses:  Neck pain  Cancer related pain    New Prescriptions New Prescriptions   No medications on file     Montine Circle, PA-C 03/16/16 Benns Church, MD 03/16/16 [phone]

## 2016-03-16 NOTE — Progress Notes (Signed)
No charge note  Palliative Medicine consult noted. Due to high referral volume, there may be a delay seeing this patient. Please call the Palliative Medicine Team office at [phone] if recommendations are needed in the interim.  Thank you for inviting [place] to see this patient.  Imogene Burn, [place] [facility] [phone]

## 2016-03-16 NOTE — Progress Notes (Signed)
Pt found on floor from unwitnessed fall. RN heard pt yelling out from hallway that she had fallen. RN found pt laying on floor and Charge RN soon to follow. Pt states that she lost her balance and fell on her behind. No injury with fall. Post-fall huddle was done with pt, RN, CN, MD and AC. Pt states that she is not having any more increased pain other than her chronic back pain. Vitals stable. Pt updated family members. RN contacted director of unit and informed her of fall. Pt was placed with nonslip socks and fall risk armband. Pt was informed that she needs to call out before getting out of bed and bed alarm was initiated. Pt signed nurse-patient fall risk paper. Pt is agreeable. No questions or concerns at this time.  Novella Olive, RN   03/16/16 1645  What Happened  Was fall witnessed? No  Was patient injured? Unsure  Patient found on floor  Found by Staff-comment (pt found on floor by RN on buttocks)  Follow Up  MD notified Dr. Carles Collet  Time MD notified [phone]  Simple treatment Other (comment)  Adult Fall Risk Assessment  Risk Factor Category (scoring not indicated) High fall risk per protocol (document High fall risk)  Patient's Fall Risk High Fall Risk (>13 points)  Adult Fall Risk Interventions  Required Bundle Interventions *See Row Information* High fall risk - low, moderate, and high requirements implemented  Additional Interventions Use of appropriate toileting equipment (bedpan, BSC, etc.);Individualized elimination schedule  Screening for Fall Injury Risk  Risk For Fall Injury- See Row Information  Nurse judgement;F  Injury Prevention Interventions Specialty Low Bed  Vitals  BP (!) 159/97  MAP (mmHg) 114  BP Location Right Arm  BP Method Automatic  Patient Position (if appropriate) Sitting  Pulse Rate 83  Pulse Rate Source Dinamap  Resp 16  Oxygen Therapy  SpO2 100 %  O2 Device Room Air  Pain Assessment  Pain Assessment 0-10  Pain Score 2  Pain Type Acute pain  Pain  Location Neck  Pain Orientation Mid  Pain Descriptors / Indicators Throbbing;Jabbing  Pain Frequency Intermittent  Pain Onset On-going  Patients Stated Pain Goal 1  Pain Intervention(s) Medication (See eMAR)  PCA/Epidural/Spinal Assessment  Respiratory Pattern Regular;Unlabored;Symmetrical  Neurological  Neuro (WDL) WDL  Level of Consciousness Alert  Orientation Level Oriented X4  Cognition Appropriate at baseline;Appropriate safety awareness;Follows commands  Speech Clear  Neuro Symptoms None  Neuro Additional Assessments No  Musculoskeletal  Musculoskeletal (WDL) WDL  Integumentary  Integumentary (WDL) X  RN Assisting with Skin Assessment on Admission Harvest Dark, RN  Skin Color Pale  Skin Condition Dry  Skin Integrity Rash  Rash Location Neck  Rash Location Orientation Mid  Skin Turgor Non-tenting  Pain Assessment  Date Pain First Started 03/16/16  Result of Injury No  Pain Screening  Clinical Progression Not changed

## 2016-03-16 NOTE — H&P (Signed)
History and Physical  DAURICE OVANDO HWE:[id number] DOB: 1955/10/11 DOA: 03/16/2016   PCP: Reginia Naas, MD   Patient coming from: Home  Chief Complaint: back/neck pain  HPI:  Jasmine Grant is a 61 y.o. female with medical history of pulmonary embolus, metastatic adenocarcinoma of the lung, and anxiety presented with one-week history of worsening neck and upper back pain. The patient is followed by Dr. Alvy Bimler. The patient had a recent CT of her chest on 01/03/2016 which showed interval progression of bilateral pulmonary nodules suggesting metastatic progression. The patient last saw Dr. Alvy Bimler in the office on 02/06/16 at which time, a decision was made to stop any further chemotherapy. The patient's last chemotherapy was 12/07/2015. Because of worsening neck and upper back pain, MRI of the thoracic spine was obtained on 02/29/2016. This showed metastatic disease at the T1 vertebral body with early extraosseous encroachment of the ventral spinal cord. It also showed extensive metastatic disease to C3 and C4 bodies. There was patchy metastatic disease to C6 and C7. Dilaudid by mouth 2 mg every 3 hours was added to help with the patient's pain control. The patient states that she has been taking approximately 6 tablets per day. Unfortunately, she continued to have worsening pain. The patient was seen by radiation oncology, Dr. Lisbeth Renshaw 03/07/2016. Before restarting further radiation therapy, Dr. Lisbeth Renshaw wanted to obtain an MRI of the cervical spine.  Because of the patient's anxiety and claustrophobia, she requested general anesthesia for the MRI. This is been tentatively scheduled for 04/07/2016 according to the patient.  The patient denies any upper extremity or lower extremity weakness. She has her chronic dysesthesias in her bilateral fourth and fifth fingers have been unchanged. She denies any fevers, chills, chest pain, dizziness, syncope. The patient has chronic shortness of breath which has  been unchanged. She denies abdominal pain, dysuria, hematuria.  In the emergency department, the patient was afebrile and hemodynamically stable saturating 94% on room air. CBC and BMP were unremarkable. The patient was given Dilaudid 1 mg IV 2 as as well as Ativan IV to 1 mg 1. The patient continued to have pain. Dr. Alvy Bimler was contacted and recommended  Admission for further pain control and palliative medicine consultation Assessment/Plan: Uncontrolled cancer related pain -Secondary to metastatic adenocarcinoma of the lung to vertebrae -Dilaudid 1 mg IV every 2 hours which may be increased as needed -consult palliative medicine for GOC and symptom management -continue OxyContin 10 mg bid  Metastatic adenocarcinoma of the lung to the vertebrae -Before pursuing general anesthesia in order to obtain MRI of the cervical spine, consult palliative medicine to discuss goals of care with which the patient was agreeable  -consulted Dr. Alvy Bimler  Anxiety -Increase Ativan to 0.5 mg every 6 hours when necessary anxiety  Pulmonary embolus history -Diagnosed Aug 2017 -Continue rivaroxaban -Clinically stable at present  COPD -Stable presently on room air without exacerbation -continue Anoro -albuterol prn dyspnea  Loose Stool -This was caused by the patient taking milk of magnesia on 3 consecutive days -Monitor clinically      Past Medical History:  Diagnosis Date  . Arthritis 04/27/2014  . Drug-induced hypokalemia 05/26/2015  . Dysuria 11/03/2014  . Essential hypertension 04/27/2014  . Headache disorder 06/08/2015  . Hx of thyroid cancer    10 years ago  . Insomnia 12/29/2013  . Lung cancer ([place]) 12/14/13   Adenocarcinoma of right upper lobe mediastinal mass and bone   . 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  Panic attack   . S/P radiation therapy completed 12/16/13-02/05/14   RULL lung  . Thyroid ca [facility]) dx'd 20+yrs ago   surg only  . UTI (lower urinary tract infection) 05/26/2015  .  UTI (urinary tract infection) 04/29/2015   Past Surgical History:  Procedure Laterality Date  . THYROIDECTOMY     Social History:  reports that she quit smoking about 7 years ago. Her smoking use included Cigarettes. She has a 30.00 pack-year smoking history. She has never used smokeless tobacco. She reports that she drinks alcohol. She reports that she does not use drugs.   Family History  Problem Relation Age of Onset  . Heart disease Mother   . Heart disease Father   . Thyroid cancer Father      Allergies  Allergen Reactions  . Codeine Nausea And Vomiting  . Penicillins Other (See Comments)    Reaction:  Unknown  Has patient had a PCN reaction causing immediate rash, facial/tongue/throat swelling, SOB or lightheadedness with hypotension: Unsure  Has patient had a PCN reaction causing severe rash involving mucus membranes or skin necrosis: Unsure  Has patient had a PCN reaction that required hospitalization Unsure  Has patient had a PCN reaction occurring within the last 10 years: No If all of the above answers are "NO", then may proceed with Cephalosporin use.  . Sulfa Antibiotics Nausea And Vomiting     Prior to Admission medications   Medication Sig Start Date End Date Taking? Authorizing Provider  albuterol (PROVENTIL) (2.5 MG/3ML) 0.083% nebulizer solution Take 3 mLs (2.5 mg total) by nebulization every 4 (four) hours as needed for wheezing or shortness of breath. 10/28/15  Yes Collene Gobble, MD  albuterol (VENTOLIN HFA) 108 (90 Base) MCG/ACT inhaler Inhale 2 puffs into the lungs every 4 (four) hours as needed for wheezing or shortness of breath. 02/15/16  Yes [name], MD  guaiFENesin (MUCINEX) 600 MG 12 hr tablet Take 600 mg by mouth 2 (two) times daily.   Yes Historical Provider, MD  HYDROmorphone (DILAUDID) 2 MG tablet Take 1 tablet (2 mg total) by mouth every 4 (four) hours as needed for severe pain. 03/12/16  Yes [name], MD  levothyroxine (SYNTHROID, LEVOTHROID)  200 MCG tablet Take 200 mcg by mouth daily before breakfast.   Yes Historical Provider, MD  lidocaine (LIDODERM) 5 % Place 1 patch onto the skin daily. Remove & Discard patch within 12 hours or as directed by MD 01/27/16  Yes [name], MD  LORazepam (ATIVAN) 0.5 MG tablet Take 0.5 mg by mouth every 8 (eight) hours as needed for anxiety.   Yes Historical Provider, MD  ondansetron (ZOFRAN) 8 MG tablet Take 8 mg by mouth every 6 (six) hours as needed for nausea or vomiting.   Yes Historical Provider, MD  oxyCODONE (OXYCONTIN) 10 mg 12 hr tablet Take 10 mg by mouth every 12 (twelve) hours as needed (for pain).   Yes Historical Provider, MD  predniSONE (DELTASONE) 10 MG tablet Take 10 mg by mouth daily with breakfast.   Yes Historical Provider, MD  ranitidine (ZANTAC) 150 MG tablet Take 150 mg by mouth at bedtime.   Yes Historical Provider, MD  rivaroxaban (XARELTO) 20 MG TABS tablet Take 1 tablet (20 mg total) by mouth daily with supper. 10/04/15  Yes [name], MD  temazepam (RESTORIL) 15 MG capsule Take 1 capsule (15 mg total) by mouth at bedtime as needed for sleep. 02/06/16  Yes [name], MD  umeclidinium-vilanterol (ANORO ELLIPTA)  62.5-25 MCG/INH AEPB Inhale 1 puff into the lungs daily.   Yes Historical Provider, MD    Review of Systems:  Constitutional:  No weight loss, night sweats, Fevers, chills, Head&Eyes: No headache.  No vision loss.  No eye pain or scotoma ENT:  No Difficulty swallowing,Tooth/dental problems,Sore throat,  No ear ache, post nasal drip,  Cardio-vascular:  No chest pain, Orthopnea, PND, swelling in lower extremities,  dizziness, palpitations  GI:  No  abdominal pain, vomiting, diarrhea, loss of appetite, hematochezia, melena, heartburn, indigestion, Resp:  No coughing up of blood .No wheezing.No chest wall deformity  Skin:  no rash or lesions.  GU:  no dysuria, change in color of urine, no urgency or frequency. No flank pain.  Musculoskeletal:  No joint pain or  swelling. No decreased range of motion.  Psych:  No change in mood or affect.  Neurologic: No headache, no dysesthesia, no focal weakness, no vision loss. No syncope  Physical Exam: Vitals:   03/16/16 1200 03/16/16 1300 03/16/16 1500 03/16/16 1645  BP: (!) 142/108 (!) 161/102 (!) 150/94 (!) 159/97  Pulse: 72 73 76 83  Resp: '13 17 17 16  '$ Temp:   98.1 F (36.7 C)   TempSrc:   Oral   SpO2: 94% 94% 96% 100%  Weight:      Height:       General:  A&O x 3, NAD, nontoxic, pleasant/cooperative Head/Eye: No conjunctival hemorrhage, no icterus, [place]/AT, No nystagmus ENT:  No icterus,  No thrush, good dentition, no pharyngeal exudate Neck:  No masses, no lymphadenpathy, no bruits CV:  RRR, no rub, no gallop, no S3 Lung:  CTAB, good air movement, no wheeze, no rhonchi Abdomen: soft/NT, +BS, nondistended, no peritoneal signs Ext: No cyanosis, No rashes, No petechiae, No lymphangitis, No edema Neuro: CNII-XII intact, strength 4/5 in bilateral upper and lower extremities, no dysmetria  Labs on Admission:  Basic Metabolic Panel:  Recent Labs Lab 03/16/16 1109  NA 140  K 3.8  CL 107  CO2 24  GLUCOSE 154*  BUN 11  CREATININE 0.96  CALCIUM 9.1   Liver Function Tests: No results for input(s): AST, ALT, ALKPHOS, BILITOT, PROT, ALBUMIN in the last 168 hours. No results for input(s): LIPASE, AMYLASE in the last 168 hours. No results for input(s): AMMONIA in the last 168 hours. CBC:  Recent Labs Lab 03/16/16 1109  WBC 7.7  NEUTROABS 5.7  HGB 12.5  HCT 40.4  MCV 92.0  PLT 215   Coagulation Profile: No results for input(s): INR, PROTIME in the last 168 hours. Cardiac Enzymes: No results for input(s): CKTOTAL, CKMB, CKMBINDEX, TROPONINI in the last 168 hours. BNP: Invalid input(s): POCBNP CBG: No results for input(s): GLUCAP in the last 168 hours. Urine analysis:    Component Value Date/Time   LABSPEC 1.020 12/07/2015 0848   PHURINE 6.0 12/07/2015 0848   GLUCOSEU Negative  12/07/2015 0848   HGBUR Moderate 12/07/2015 0848   BILIRUBINUR Negative 12/07/2015 0848   KETONESUR Negative 12/07/2015 0848   PROTEINUR 100 12/07/2015 0848   PROTEINUR 100 04/06/2015 0953   UROBILINOGEN 0.2 12/07/2015 0848   NITRITE Negative 12/07/2015 0848   LEUKOCYTESUR Moderate 12/07/2015 0848   Sepsis Labs: '@LABRCNTIP'$ (procalcitonin:4,lacticidven:4) )No results found for this or any previous visit (from the past 240 hour(s)).   Radiological Exams on Admission: No results found.      Time spent:60 minutes Code Status:   DNR Family Communication:  No Family at bedside Disposition Plan: expect 2-3 day hospitalization Consults  called: MedOnc--[place]; palliative medicine DVT Prophylaxis:Xarelto  Sinclair Arrazola, DO  [facility] Pager [phone]  If 7PM-7AM, please contact night-coverage [email or web address] Password [facility] 03/16/2016, 6:54 PM

## 2016-03-16 NOTE — ED Triage Notes (Signed)
Patient c/o neck and back pain. Patient states she was recently diagnosed with a pot on her spine. Patient very tearful.

## 2016-03-17 DIAGNOSIS — Z7189 Other specified counseling: Secondary | ICD-10-CM

## 2016-03-17 LAB — BASIC METABOLIC PANEL
Anion gap: 6 (ref 5–15)
BUN: 12 mg/dL (ref 6–20)
CALCIUM: 8.7 mg/dL — AB (ref 8.9–10.3)
CO2: 29 mmol/L (ref 22–32)
Chloride: 106 mmol/L (ref 101–111)
Creatinine, Ser: 0.92 mg/dL (ref 0.44–1.00)
GFR calc Af Amer: >60 mL/min (ref >60)
GFR calc non Af Amer: >60 mL/min (ref >60)
GLUCOSE: 135 mg/dL — AB (ref 65–99)
Potassium: 4.1 mmol/L (ref 3.5–5.1)
SODIUM: 141 mmol/L (ref 135–145)

## 2016-03-17 LAB — HIV ANTIBODY (ROUTINE TESTING W REFLEX): HIV SCREEN 4TH GENERATION: NONREACTIVE

## 2016-03-17 MED ORDER — PROCHLORPERAZINE MALEATE 10 MG PO TABS: 5.0000 mg | ORAL_TABLET | Freq: Four times a day (QID) | ORAL | Status: DC | PRN

## 2016-03-17 MED ORDER — ONDANSETRON HCL 4 MG/2ML IJ SOLN
4.0000 mg | Freq: Four times a day (QID) | INTRAMUSCULAR | Status: DC | PRN
  Administered 2016-03-18 – 2016-03-20 (×2): 4 mg via INTRAVENOUS
  Filled 2016-03-17 (×3): qty 2

## 2016-03-17 MED ORDER — POLYETHYLENE GLYCOL 3350 17 G PO PACK
17.0000 g | PACK | Freq: Every day | ORAL | Status: DC
  Administered 2016-03-20 – 2016-03-21 (×2): 17 g via ORAL
  Filled 2016-03-17 (×3): qty 1

## 2016-03-17 MED ORDER — ONDANSETRON HCL 4 MG/2ML IJ SOLN
4.0000 mg | Freq: Four times a day (QID) | INTRAMUSCULAR | Status: DC
  Administered 2016-03-17 – 2016-03-21 (×14): 4 mg via INTRAVENOUS
  Filled 2016-03-17 (×13): qty 2

## 2016-03-17 NOTE — Progress Notes (Signed)
PROGRESS NOTE    Jasmine Grant  [id number]:[id number]  DOB: 09/25/1955  DOA: 03/16/2016 PCP: Jasmine Naas, MD Outpatient Specialists:   Hospital course: Jasmine Grant is a 61 y.o. female with medical history of pulmonary embolus, metastatic adenocarcinoma of the lung, and anxiety presented with one-week history of worsening neck and upper back pain. The patient is followed by Dr. Alvy Bimler. The patient had a recent CT of her chest on 01/03/2016 which showed interval progression of bilateral pulmonary nodules suggesting metastatic progression.  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 Alvy Bimler  Anxiety -Increase Ativan to 0.5 mg every 6 hours when necessary anxiety  Pulmonary embolus history -Diagnosed Aug 2017 -Continue rivaroxaban -Clinically stable at present  COPD -Stable presently on room air without exacerbation -continue Anoro -albuterol prn dyspnea  Loose Stool -This was caused by the patient taking milk of magnesia on 3 consecutive days -Monitor clinically  Consultants:  Palliative medicine  Subjective: Pt reports that her pain is better controlled this morning, some nausea with dilaudid  Objective: Vitals:   03/16/16 1645 03/16/16 2037 03/17/16 0439 03/17/16 0816  BP: (!) 159/97 131/62 137/69   Pulse: 83 64 61   Resp: '16 18 18   '$ Temp:  97.7 F (36.5 C) 98.2 F (36.8 C)   TempSrc:  Oral Oral   SpO2: 100% 94% 94% 93%  Weight:      Height:        Intake/Output Summary  (Last 24 hours) at 03/17/16 1252 Last data filed at 03/17/16 4970  Gross per 24 hour  Intake           506.25 ml  Output                0 ml  Net           506.25 ml   Filed Weights   03/16/16 1059  Weight: 107.5 kg (237 lb)    Exam:  General:  A&O x 3, NAD, nontoxic, pleasant/cooperative Head/Eye: No conjunctival hemorrhage, no icterus, [place]/AT, No nystagmus ENT:  No icterus,  No thrush, good dentition, no pharyngeal exudate Neck:  No masses, no lymphadenpathy, no bruits CV:  RRR, no rub, no gallop, no S3 Lung:  CTAB, good air movement, no wheeze, no rhonchi Abdomen: soft/NT, +BS, nondistended, no peritoneal signs Ext: No cyanosis, No rashes, No petechiae, No lymphangitis, No edema Neuro: CNII-XII intact, strength 4/5 in bilateral upper and lower extremities, no dysmetria  Data Reviewed: Basic Metabolic Panel:  Recent Labs Lab 03/16/16 1109 03/17/16 0421  NA 140 141  K 3.8 4.1  CL 107 106  CO2 24 29  GLUCOSE 154* 135*  BUN 11 12  CREATININE 0.96 0.92  CALCIUM 9.1 8.7*   Liver Function Tests: No results for input(s): AST, ALT, ALKPHOS, BILITOT, PROT, ALBUMIN in the last 168 hours. No results for input(s): LIPASE, AMYLASE in the last 168 hours. No results for input(s): AMMONIA in the last 168 hours. CBC:  Recent Labs Lab 03/16/16 1109  WBC 7.7  NEUTROABS 5.7  HGB 12.5  HCT 40.4  MCV 92.0  PLT 215   Cardiac Enzymes: No results for input(s): CKTOTAL, CKMB, CKMBINDEX, TROPONINI in the last 168 hours. CBG (last 3)  No results for input(s): GLUCAP in the last 72 hours. No results found for this or any previous visit (from the past 240 hour(s)).   Studies: No results found.   Scheduled Meds: . famotidine  20 mg Oral BID  . guaiFENesin  600 mg Oral BID  . levothyroxine  200 mcg Oral QAC breakfast  . lidocaine  1 patch Transdermal Q24H  . oxyCODONE  10 mg Oral Q12H  . predniSONE  10 mg Oral Q breakfast  . rivaroxaban  20 mg Oral Q supper  .  umeclidinium-vilanterol  1 puff Inhalation Daily   Continuous Infusions:  Active Problems:   Pathologic compression fracture of spine (HCC)   Other specified hypothyroidism   Pulmonary embolism (HCC)   Lung cancer, primary, with metastasis from lung to other site, left [facility])   Uncontrolled pain   Cancer related pain  Time spent:   Irwin Brakeman, MD, FAAFP [facility] Pager [phone]  If 7PM-7AM, please contact night-coverage [email or web address] Password [email or web address] 03/17/2016, 12:52 PM    LOS: 1 day

## 2016-03-18 DIAGNOSIS — Z515 Encounter for palliative care: Secondary | ICD-10-CM

## 2016-03-18 MED ORDER — OXYCODONE HCL 5 MG PO TABS
5.0000 mg | ORAL_TABLET | ORAL | Status: DC | PRN
  Administered 2016-03-19 – 2016-03-21 (×4): 10 mg via ORAL
  Filled 2016-03-18 (×4): qty 2

## 2016-03-18 MED ORDER — ALUM & MAG HYDROXIDE-SIMETH 200-200-20 MG/5ML PO SUSP
30.0000 mL | Freq: Four times a day (QID) | ORAL | Status: DC | PRN
  Administered 2016-03-18: 30 mL via ORAL
  Filled 2016-03-18: qty 30

## 2016-03-18 MED ORDER — OXYCODONE HCL ER 20 MG PO T12A
20.0000 mg | EXTENDED_RELEASE_TABLET | Freq: Two times a day (BID) | ORAL | Status: DC
  Administered 2016-03-18 – 2016-03-21 (×6): 20 mg via ORAL
  Filled 2016-03-18 (×6): qty 1

## 2016-03-18 MED ORDER — MORPHINE SULFATE (PF) 4 MG/ML IV SOLN: 4.0000 mg | INTRAVENOUS | Status: DC | PRN

## 2016-03-18 NOTE — Progress Notes (Signed)
PROGRESS NOTE    Jasmine Grant  [id number]  DOB: March 21, 1955  DOA: 03/16/2016 PCP: Reginia Naas, MD  Hospital course: Jasmine Grant is a 61 y.o. female with medical history of pulmonary embolus, metastatic adenocarcinoma of the lung, and anxiety presented with one-week history of worsening neck and upper back pain. The patient is followed by Dr. Alvy Bimler. The patient had a recent CT of her chest on 01/03/2016 which showed interval progression of bilateral pulmonary nodules suggesting metastatic progression.  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 Alvy Bimler  Anxiety -Increased Ativan to 0.5 mg every 6 hours when necessary anxiety  Pulmonary embolus history -Diagnosed Aug 2017 -Continue rivaroxaban -Clinically stable at present  COPD -Stable presently on room air without exacerbation -continue Anoro -albuterol prn dyspnea  Loose Stool -This was caused by the patient taking milk of magnesia on 3 consecutive days -Monitor clinically  Consultants:  Palliative medicine  Subjective: Pt reports that she believes that oxycodone is causing her nausea  Objective: Vitals:   03/17/16 1409 03/17/16 2007 03/18/16 0400 03/18/16 0858  BP: (!) 143/71 (!) 103/59 106/75   Pulse: 78 88 87   Resp: '20 19 18   '$ Temp: 98.2 F (36.8 C) 98.2 F (36.8 C) 98.3 F (36.8 C)   TempSrc: Oral Oral Oral   SpO2: 95% 99% 92% 93%  Weight:      Height:        Intake/Output Summary (Last 24 hours) at 03/18/16 1319 Last data filed at 03/18/16 0800  Gross per 24 hour  Intake               240 ml  Output                0 ml  Net              240 ml   Filed Weights   03/16/16 1059  Weight: 107.5 kg (237 lb)    Exam:  General:  A&O x 3, NAD, nontoxic, pleasant/cooperative Head/Eye: No conjunctival hemorrhage, no icterus, [place]/AT, No nystagmus ENT:  No icterus,  No thrush, good dentition, no pharyngeal exudate Neck:  No masses, no lymphadenpathy, no bruits CV:  RRR, no rub, no gallop, no S3 Lung:  CTAB, good air movement, no wheeze, no rhonchi Abdomen: soft/NT, +BS, nondistended, no peritoneal signs Ext: No cyanosis, No rashes, No petechiae, No lymphangitis, No edema Neuro: CNII-XII intact, strength 4/5 in bilateral upper and lower extremities, no dysmetria  Data Reviewed: Basic Metabolic Panel:  Recent Labs Lab 03/16/16 1109 03/17/16 0421  NA 140 141  K 3.8 4.1  CL 107 106  CO2 24 29  GLUCOSE 154* 135*  BUN 11 12  CREATININE 0.96 0.92  CALCIUM 9.1 8.7*   Liver Function Tests: No results for input(s): AST, ALT, ALKPHOS, BILITOT, PROT, ALBUMIN in the last 168 hours. No results for input(s): LIPASE, AMYLASE in the last 168 hours. No results for input(s): AMMONIA in the last 168 hours. CBC:  Recent Labs Lab 03/16/16 1109  WBC 7.7  NEUTROABS 5.7  HGB 12.5  HCT 40.4  MCV 92.0  PLT 215  Cardiac Enzymes: No results for input(s): CKTOTAL, CKMB, CKMBINDEX, TROPONINI in the last 168 hours. CBG (last 3)  No results for input(s): GLUCAP in the last 72 hours. No results found for this or any previous visit (from the past 240 hour(s)).   Studies: No results found.   Scheduled Meds: . famotidine  20 mg Oral BID  . guaiFENesin  600 mg Oral BID  . levothyroxine  200 mcg Oral QAC breakfast  . lidocaine  1 patch Transdermal Q24H  . ondansetron (ZOFRAN) IV  4 mg Intravenous Q6H  . oxyCODONE  10 mg Oral Q12H  . polyethylene glycol  17 g Oral Daily  . predniSONE  10 mg Oral Q breakfast  . rivaroxaban  20 mg Oral Q supper  .  umeclidinium-vilanterol  1 puff Inhalation Daily   Continuous Infusions:  Active Problems:   Pathologic compression fracture of spine (HCC)   Other specified hypothyroidism   Pulmonary embolism (HCC)   Lung cancer, primary, with metastasis from lung to other site, left [facility])   Uncontrolled pain   Cancer related pain  Time spent:   Irwin Brakeman, MD, FAAFP [facility] Pager [phone]  If 7PM-7AM, please contact night-coverage [email or web address] Password [email or web address] 03/18/2016, 1:19 PM    LOS: 2 days

## 2016-03-18 NOTE — Progress Notes (Addendum)
Daily Progress Note   Patient Name: Jasmine Grant       Date: 03/18/2016 DOB: [birth year]  Age: 61 y.o. MRN#: [id number] Attending Physician: Murlean Iba, MD Primary Care Physician: Reginia Naas, MD Admit Date: 03/16/2016  Reason for Consultation/Follow-up: Establishing goals of care, Non pain symptom management, Pain control and Psychosocial/spiritual support  Subjective: I saw Ms. Klassen today in conjunction with her bedside RN.  She was sitting up in bed and spending time with her son and ex-husband. She was much more engaged in conversation today.  She reports that she had told Dr. Wynetta Emery that she felt OxyContin was the cause of her nausea. She and her nurse have been experimenting today and she thinks now that her Dilaudid actually causes her to be much more nauseous than oxycodone does. We had a long discussion regarding options moving forward for pain management and nausea.  We discussed continuation of current medication of Dilaudid versus increased dose of OxyContin with rescue oxycodone versus transition to another opioid such as fentanyl.  Length of Stay: 2  Current Medications: Scheduled Meds:  . famotidine  20 mg Oral BID  . guaiFENesin  600 mg Oral BID  . levothyroxine  200 mcg Oral QAC breakfast  . lidocaine  1 patch Transdermal Q24H  . ondansetron (ZOFRAN) IV  4 mg Intravenous Q6H  . oxyCODONE  20 mg Oral Q12H  . polyethylene glycol  17 g Oral Daily  . predniSONE  10 mg Oral Q breakfast  . rivaroxaban  20 mg Oral Q supper  . umeclidinium-vilanterol  1 puff Inhalation Daily    Continuous Infusions:   PRN Meds: acetaminophen **OR** acetaminophen, albuterol, alum & mag hydroxide-simeth, LORazepam, morphine injection, ondansetron (ZOFRAN) IV, oxyCODONE,  promethazine, sodium chloride flush, temazepam  Physical Exam         General: Alert, awake, in no acute distress. Sitting up in bed and more interactive today   HEENT: No bruits, no goiter, no JVD Heart: Regular rate and rhythm. No murmur appreciated. Lungs: Good air movement, clear Abdomen: Soft, nontender, nondistended, positive bowel sounds.  Ext: No significant edema Skin: Warm and dry Neuro: Grossly intact, nonfocal.  Vital Signs: BP (!) 150/77 (BP Location: Right Arm)   Pulse 66   Temp 98.6 F (37 C) (Oral)   Resp 18  Ht '6\' 2"'$  (1.88 m)   Wt 107.5 kg (237 lb)   SpO2 94%   BMI 30.43 kg/m  SpO2: SpO2: 94 % O2 Device: O2 Device: Not Delivered O2 Flow Rate:    Intake/output summary:  Intake/Output Summary (Last 24 hours) at 03/18/16 1959 Last data filed at 03/18/16 1500  Gross per 24 hour  Intake              600 ml  Output                0 ml  Net              600 ml   LBM: Last BM Date: 03/15/16 Baseline Weight: Weight: 107.5 kg (237 lb) Most recent weight: Weight: 107.5 kg (237 lb)       Palliative Assessment/Data:    Flowsheet Rows   Flowsheet Row Most Recent Value  Intake Tab  Referral Department  Hospitalist  Unit at Time of Referral  Med/Surg Unit  Palliative Care Primary Diagnosis  Cancer  Date Notified  03/16/16  Palliative Care Type  New Palliative care  Reason for referral  Clarify Goals of Care  Date of Admission  03/16/16  # of days IP prior to Palliative referral  0  Clinical Assessment  Psychosocial & Spiritual Assessment  Palliative Care Outcomes      Patient Active Problem List   Diagnosis Date Noted  . Uncontrolled pain 03/16/2016  . Cancer related pain   . Lung cancer, primary, with metastasis from lung to other site, left ([place])   . Cancer associated pain 02/06/2016  . Atrial fibrillation, transient ([place]) 11/30/2015  . Goals of care, counseling/discussion 10/19/2015  . Atypical chest pain 10/05/2015  . Dizziness 10/05/2015    . Pulmonary embolism ([place]) 09/15/2015  . Pulmonary embolus ([place]) 09/15/2015  . Radiation pneumonitis ([place]) 08/18/2015  . DNR (do not resuscitate) 08/18/2015  . Acute bronchitis due to infection 07/27/2015  . Leukocytosis 07/07/2015  . Headache disorder 06/08/2015  . UTI (lower urinary tract infection) 05/26/2015  . Drug-induced hypokalemia 05/26/2015  . Dehydration 05/26/2015  . Chemotherapy-induced nausea 05/26/2015  . Palliative care by specialist 05/18/2015  . Thrombocytopenia ([place]) 02/24/2015  . Other specified hypothyroidism 02/03/2015  . Numbness and tingling of right arm 01/12/2015  . Rash 11/26/2014  . COPD (chronic obstructive pulmonary disease) ([employer]) 11/25/2014  . Pancytopenia due to antineoplastic chemotherapy ([place]) 11/10/2014  . Neuropathy due to chemotherapeutic drug ([place]) 11/10/2014  . Dysuria 11/03/2014  . Cough 10/06/2014  . 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 Protein calorie malnutrition ([place]) 08/26/2014  . Elevated serum creatinine 08/26/2014  . Right shoulder pain 06/17/2014  . 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 Arthritis 04/27/2014  . Bilateral leg edema 04/27/2014  . Sinusitis, chronic 04/27/2014  . Essential hypertension 04/27/2014  . Other constipation 04/27/2014  . Cellulitis 04/20/2014  . Anemia due to antineoplastic chemotherapy 03/16/2014  . Cyst of skin 02/26/2014  . Other fatigue 02/26/2014  . Leukopenia due to antineoplastic chemotherapy ([place]) 01/28/2014  . Radiation-induced dermatitis 01/28/2014  . Esophagitis 01/07/2014  . Hypersensitivity reaction 01/04/2014  . Insomnia 12/29/2013  . Constipation due to opioid therapy 12/17/2013  . Adenocarcinoma of upper lobe of right lung   . Nausea with vomiting 12/16/2013  . Elevated blood pressure 12/15/2013  . Generalized anxiety disorder 12/14/2013  . Acquired hypothyroidism 12/14/2013  . Overweight (BMI 25.0-29.9) 12/14/2013  . Epidural mass 12/13/2013  . 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 Pathologic  compression  fracture of spine [facility])     Palliative Care Assessment & Plan   Patient Profile: 61 y.o. 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 Palliative consulted for symptom management and goals of care discussion.  Recommendations/Plan:  She would like to talk with radiation oncology prior to having any further conversations regarding her goals of care.  Pain: Discussed multiple options for trying to balance pain management with side effects of nausea. We decided on trial of a plan to increase her OxyContin, use breakthrough oxycodone for rescue medication, and have a backup IV rescue medication in case oral route is ineffective. We discussed IV medications of Dilaudid (reports this made her very sick and does not want, fentanyl (she does not want something the short acting) and morphine.  She would like to try morphine for IV rescue medication.  Her total oral morphine equivalent last 24 hours is approximately 110 mg. We'll therefore plan for increasing OxyContin to 20 mg twice a day, rescue dose of 5-10 mg of oxycodone every 3 hours as needed, and second line rescue medication of morphine 4 mg IV to be given 40 minutes after oral medication if oral route is ineffective. If she has issues with this regimen as well, I would consider rotating to fentanyl for long acting agent via fentanyl patch.  Nausea: This remains a large problem. She also reports having side effects to multiple medications I discussed with her for nausea. She reports the Phenergan was effective in relieving her nausea but caused a lot of sedation.  While we are working on pain management, we discussed continuation of scheduled Zofran while continuing to use Phenergan as needed as this was effective in  controlling symptoms.   Code Status:    Code Status Orders        Start     Ordered   03/16/16 1546  Do not attempt resuscitation (DNR)  Continuous    Question Answer Comment  In the event of cardiac or respiratory ARREST Do not call a "code blue"   In the event of cardiac or respiratory ARREST Do not perform Intubation, CPR, defibrillation or ACLS   In the event of cardiac or respiratory ARREST Use medication by any route, position, wound care, and other measures to relive pain and suffering. May use oxygen, suction and manual treatment of airway obstruction as needed for comfort.      03/16/16 1545    Code Status History    Date Active Date Inactive Code Status Order ID Comments User Context   09/15/2015 10:51 PM 09/17/2015  3:37 PM Full Code [id number]  Lily Kocher, MD Inpatient   03/29/2014  5:14 PM 03/30/2014  3:49 AM Full Code [id number]  Markus Daft, MD HOV   12/14/2013  4:23 PM 12/17/2013 11:52 PM Full Code [id number]  Carylon Perches, MD Inpatient   12/14/2013 12:08 AM 12/14/2013  4:23 PM Full Code [id number]  Theressa Millard, MD Inpatient    Advance Directive Documentation   Flowsheet Row Most Recent Value  Type of Advance Directive  Healthcare Power of Attorney, Living will  Pre-existing out of facility DNR order (yellow form or pink MOST form)  No data  "MOST" Form in Place?  No data       Prognosis:   Unable to determine  Discharge  Planning:  To Be Determined  Care plan was discussed with patient, bedside RN  Thank you for allowing the Palliative Medicine Team to assist in the care of this patient.   Total Time 30 Prolonged Time Billed No      Greater than 50%  of this time was spent counseling and coordinating care related to the above assessment and plan.  Micheline Rough, MD  Please contact Palliative Medicine Team phone at [phone] for questions and concerns.

## 2016-03-18 NOTE — Consult Note (Signed)
Consultation Note Date: 03/18/2016   Patient Name: Jasmine Grant  DOB: [birth year]  MRN: [id number]  Age / Sex: 61 y.o., female  PCP: Carol Ada, MD Referring Physician: Murlean Iba, MD  Reason for Consultation: Establishing goals of care, Non pain symptom management, Pain control and Psychosocial/spiritual support  HPI/Patient Profile: 61 y.o. 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 Palliative consulted for symptom management and goals of care discussion.  Clinical Assessment and Goals of Care: I met today with Ms. Cunningham in her room.  She reports the most important things to her living as long as she can reasonable quality of life, being at home, and her friends and family.  She states her doctors have been doing a good job explaining things to her and we discussed her clinical course over the past several months.  She reports the current plan is for another MRI followed by radiation therapy.  At this time, she remains invested in this plan and wants to have input from radiation oncology prior to having any other discussions regarding goals of care.  SUMMARY OF RECOMMENDATIONS   - DNR/DNI - Symptom management as below - With her symptom burden and requirement for IV medications, she is not in a position to consider discharge at any point in the immediate future. She reports needing to have input again from Radiation Oncology prior to having further conversations regarding goals moving forward.  As she is currently feeling so poorly, I think it is reasonable to focus on getting her symptoms under better control thru the weekend and have input from radiation oncology prior  to making further decisions regarding long-term goals of care.    Code Status/Advance Care Planning:  DNR     Symptom Management:   Pain: She reports that she continues to have consistent pain that will get increasingly worse until she takes dose of Dilaudid. She feels that Dilaudid is working better than her home medications. I would anticipate pain control to be more effective at this time as she is receiving higher doses of opioids than she is on at home.  She will most likely need to leave on higher oral dosing regimen than she presented to the hospital on.  Discussed with her beginning to titrate up on her long-acting agent today. She reports she thinks is going to make her more nauseous and would like to work on getting symptoms under control and then transitioning over to oral regimen. Reports when she takes the Dilaudid, it will last for around 3 hours until it starts to wear off and her pain worsens again. We discussed that she has medication available every 2 hours as needed. We'll plan to continue same for now.  Nausea: She reports having significant nausea when she takes opioids. She has been receiving Zofran and states that if she  gets Zofran prior to opioids her nausea is much better. This did not occur the last time she received a dose and she believes this is why she is feeling worse right now. We discussed options for nausea control with opioids including use of additional medications other than Zofran (medications with higher dopamanergic effect, such as Compazine, are often fairly effective for this) but she reports that other medications don't agree with her and wants to continue to utilize Zofran as primary medication. She generally takes 8 mg of Zofran 4-6 times per day at home. Discussed plan to schedule zofran, but she is also concerned about being able to receive a dose prior to pain medication. We will therefore plan to schedule a lower dose of 4 mg of Zofran every 6 hours as I  think this will be more effective than waiting until she is very sick and then trying to get things under control with rescue dosing.  Will also continue current 4 mg when necessary dose in case this lower dose isn't effective to relieve her nausea.  She is also willing to try Phenergan if the Zofran is ineffective.  Palliative Prophylaxis:   Aspiration, Bowel Regimen, Delirium Protocol and Frequent Pain Assessment  Psycho-social/Spiritual:   Desire for further Chaplaincy support:no  Additional Recommendations: Education on Hospice  Prognosis:   Unable to determine  Discharge Planning: To Be Determined      Primary Diagnoses: Present on Admission: . Uncontrolled pain . Pulmonary embolism ([place]) . Pathologic compression fracture of spine ([place]) . Other specified hypothyroidism . Lung cancer, primary, with metastasis from lung to other site, left [facility])   I have reviewed the medical record, interviewed the patient and family, and examined the patient. The following aspects are pertinent.  Past Medical History:  Diagnosis Date  . Arthritis 04/27/2014  . Drug-induced hypokalemia 05/26/2015  . Dysuria 11/03/2014  . Essential hypertension 04/27/2014  . Headache disorder 06/08/2015  . Hx of thyroid cancer    10 years ago  . Insomnia 12/29/2013  . 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 Panic attack   . S/P radiation therapy completed 12/16/13-02/05/14   RULL lung  . Thyroid ca [facility]) dx'd 20+yrs ago   surg only  . UTI (lower urinary tract infection) 05/26/2015  . UTI (urinary tract infection) 04/29/2015   Social History   Social History  . Marital status: Legally Separated    Spouse name: N/A  . Number of children: N/A  . Years of education: N/A   Social History Main Topics  . Smoking status: Former Smoker    Packs/day: 1.00    Years: 30.00    Types: Cigarettes    Quit date: 01/29/2009  .  Smokeless tobacco: Never Used  . Alcohol use 0.0 oz/week  . Drug use: No  . Sexual activity: Not Asked   Other Topics Concern  . None   Social History Narrative  . None   Family History  Problem Relation Age of Onset  . Heart disease Mother   . Heart disease Father   . Thyroid cancer Father    Scheduled Meds: . famotidine  20 mg Oral BID  . guaiFENesin  600 mg Oral BID  . levothyroxine  200 mcg Oral QAC breakfast  . lidocaine  1 patch Transdermal Q24H  . ondansetron (ZOFRAN) IV  4 mg Intravenous Q6H  . oxyCODONE  10 mg Oral Q12H  .  polyethylene glycol  17 g Oral Daily  . predniSONE  10 mg Oral Q breakfast  . rivaroxaban  20 mg Oral Q supper  . umeclidinium-vilanterol  1 puff Inhalation Daily   Continuous Infusions: PRN Meds:.acetaminophen **OR** acetaminophen, albuterol, alum & mag hydroxide-simeth, HYDROmorphone (DILAUDID) injection, LORazepam, ondansetron (ZOFRAN) IV, promethazine, sodium chloride flush, temazepam Medications Prior to Admission:  Prior to Admission medications   Medication Sig Start Date End Date Taking? Authorizing Provider  albuterol (PROVENTIL) (2.5 MG/3ML) 0.083% nebulizer solution Take 3 mLs (2.5 mg total) by nebulization every 4 (four) hours as needed for wheezing or shortness of breath. 10/28/15  Yes Collene Gobble, MD  albuterol (VENTOLIN HFA) 108 (90 Base) MCG/ACT inhaler Inhale 2 puffs into the lungs every 4 (four) hours as needed for wheezing or shortness of breath. 02/15/16  Yes [name], MD  guaiFENesin (MUCINEX) 600 MG 12 hr tablet Take 600 mg by mouth 2 (two) times daily.   Yes Historical Provider, MD  HYDROmorphone (DILAUDID) 2 MG tablet Take 1 tablet (2 mg total) by mouth every 4 (four) hours as needed for severe pain. 03/12/16  Yes [name], MD  levothyroxine (SYNTHROID, LEVOTHROID) 200 MCG tablet Take 200 mcg by mouth daily before breakfast.   Yes Historical Provider, MD  lidocaine (LIDODERM) 5 % Place 1 patch onto the skin daily. Remove &  Discard patch within 12 hours or as directed by MD 01/27/16  Yes [name], MD  LORazepam (ATIVAN) 0.5 MG tablet Take 0.5 mg by mouth every 8 (eight) hours as needed for anxiety.   Yes Historical Provider, MD  ondansetron (ZOFRAN) 8 MG tablet Take 8 mg by mouth every 6 (six) hours as needed for nausea or vomiting.   Yes Historical Provider, MD  oxyCODONE (OXYCONTIN) 10 mg 12 hr tablet Take 10 mg by mouth every 12 (twelve) hours as needed (for pain).   Yes Historical Provider, MD  predniSONE (DELTASONE) 10 MG tablet Take 10 mg by mouth daily with breakfast.   Yes Historical Provider, MD  ranitidine (ZANTAC) 150 MG tablet Take 150 mg by mouth at bedtime.   Yes Historical Provider, MD  rivaroxaban (XARELTO) 20 MG TABS tablet Take 1 tablet (20 mg total) by mouth daily with supper. 10/04/15  Yes [name], MD  temazepam (RESTORIL) 15 MG capsule Take 1 capsule (15 mg total) by mouth at bedtime as needed for sleep. 02/06/16  Yes [name], MD  umeclidinium-vilanterol (ANORO ELLIPTA) 62.5-25 MCG/INH AEPB Inhale 1 puff into the lungs daily.   Yes Historical Provider, MD   Allergies  Allergen Reactions  . Codeine Nausea And Vomiting  . Penicillins Other (See Comments)    Reaction:  Unknown  Has patient had a PCN reaction causing immediate rash, facial/tongue/throat swelling, SOB or lightheadedness with hypotension: Unsure  Has patient had a PCN reaction causing severe rash involving mucus membranes or skin necrosis: Unsure  Has patient had a PCN reaction that required hospitalization Unsure  Has patient had a PCN reaction occurring within the last 10 years: No If all of the above answers are "NO", then may proceed with Cephalosporin use.  . Sulfa Antibiotics Nausea And Vomiting   Review of Systems Reports headache, fatigue, nausea related to opioids  Physical Exam General: Alert, awake, in no acute distress. Lying in bed on her side, does not move much ("thats when it (pain) gets real bad"  HEENT:  No bruits, no goiter, no JVD Heart: Regular rate and rhythm. No murmur appreciated. Lungs:  Good air movement, clear Abdomen: Soft, nontender, nondistended, positive bowel sounds.  Ext: No significant edema Skin: Warm and dry Neuro: Grossly intact, nonfocal.   Vital Signs: BP 106/75 (BP Location: Right Arm)   Pulse 87   Temp 98.3 F (36.8 C) (Oral)   Resp 18   Ht _0  (1.88 m)   Wt 107.5 kg (237 lb)   SpO2 92%   BMI 30.43 kg/m  Pain Assessment: 0-10   Pain Score: 4    SpO2: SpO2: 92 % O2 Device:SpO2: 92 % O2 Flow Rate: .   IO: Intake/output summary: No intake or output data in the 24 hours ending 03/18/16 0826  LBM: Last BM Date: 03/15/16 Baseline Weight: Weight: 107.5 kg (237 lb) Most recent weight: Weight: 107.5 kg (237 lb)     Palliative Assessment/Data:   Flowsheet Rows   Flowsheet Row Most Recent Value  Intake Tab  Referral Department  Hospitalist  Unit at Time of Referral  Med/Surg Unit  Palliative Care Primary Diagnosis  Cancer  Date Notified  03/16/16  Palliative Care Type  New Palliative care  Reason for referral  Clarify Goals of Care  Date of Admission  03/16/16  # of days IP prior to Palliative referral  0  Clinical Assessment  Psychosocial & Spiritual Assessment  Palliative Care Outcomes     Time Total: 60 Greater than 50%  of this time was spent counseling and coordinating care related to the above assessment and plan.  Signed by: Micheline Rough, MD   Please contact Palliative Medicine Team phone at [phone] for questions and concerns.  For individual provider: See Shea Evans

## 2016-03-18 NOTE — Progress Notes (Signed)
The patient is sleeping.  I did not wake her up. I discussed the case with the hospitalist. I recommend palliative care consult for symptom management. I do not have systemic treatment to offer her at this point. I will return to check on her next week.

## 2016-03-19 ENCOUNTER — Ambulatory Visit (HOSPITAL_COMMUNITY): Admit: 2016-03-19 | Payer: BLUE CROSS/BLUE SHIELD

## 2016-03-19 ENCOUNTER — Inpatient Hospital Stay (HOSPITAL_COMMUNITY): Admit: 2016-03-19 | Payer: BLUE CROSS/BLUE SHIELD

## 2016-03-19 ENCOUNTER — Ambulatory Visit (HOSPITAL_COMMUNITY)
Admit: 2016-03-19 | Discharge: 2016-03-19 | Disposition: A | Discharge status: Home / Self Care | Payer: BLUE CROSS/BLUE SHIELD | Attending: Radiation Oncology | Admitting: Radiation Oncology

## 2016-03-19 ENCOUNTER — Ambulatory Visit (HOSPITAL_COMMUNITY): Payer: BLUE CROSS/BLUE SHIELD

## 2016-03-19 ENCOUNTER — Inpatient Hospital Stay (HOSPITAL_COMMUNITY): Payer: BLUE CROSS/BLUE SHIELD | Admitting: Certified Registered Nurse Anesthetist

## 2016-03-19 ENCOUNTER — Encounter (HOSPITAL_COMMUNITY): Payer: Self-pay | Admitting: Family Medicine

## 2016-03-19 ENCOUNTER — Encounter (HOSPITAL_COMMUNITY)
Admission: EM | Disposition: A | Discharge status: Home / Self Care | Payer: Self-pay | Source: Home / Self Care | Attending: Family Medicine

## 2016-03-19 HISTORY — PX: RADIOLOGY WITH ANESTHESIA: SHX6223

## 2016-03-19 SURGERY — RADIOLOGY WITH ANESTHESIA
Anesthesia: General

## 2016-03-19 MED ORDER — PROMETHAZINE HCL 25 MG/ML IJ SOLN
6.2500 mg | INTRAMUSCULAR | Status: DC | PRN
  Administered 2016-03-19: 12.5 mg via INTRAVENOUS

## 2016-03-19 MED ORDER — GADOBENATE DIMEGLUMINE 529 MG/ML IV SOLN
20.0000 mL | Freq: Once | INTRAVENOUS | Status: AC | PRN
  Administered 2016-03-19: 20 mL via INTRAVENOUS

## 2016-03-19 MED ORDER — HYDROMORPHONE HCL 1 MG/ML IJ SOLN: 0.2500 mg | INTRAMUSCULAR | Status: DC | PRN

## 2016-03-19 MED ORDER — PROMETHAZINE HCL 25 MG/ML IJ SOLN
INTRAMUSCULAR | Status: AC
  Administered 2016-03-19: 12.5 mg via INTRAVENOUS
  Filled 2016-03-19: qty 1

## 2016-03-19 MED ORDER — OXYCODONE HCL 5 MG PO TABS
ORAL_TABLET | ORAL | Status: AC
  Filled 2016-03-19: qty 2

## 2016-03-19 MED ORDER — LACTATED RINGERS IV SOLN
Freq: Once | INTRAVENOUS | Status: AC
  Administered 2016-03-19: 14:00:00 via INTRAVENOUS

## 2016-03-19 NOTE — Progress Notes (Signed)
PROGRESS NOTE    Jasmine Grant  DXA:[id number]  DOB: [birth year]  DOA: 03/16/2016 PCP: Reginia Naas, MD  Hospital course: Jasmine Grant is a 61 y.o. female with medical history of pulmonary embolus, metastatic adenocarcinoma of the lung, and anxiety presented with one-week history of worsening neck and upper back pain. The patient is followed by Dr. Alvy Bimler. The patient had a recent CT of her chest on 01/03/2016 which showed interval progression of bilateral pulmonary nodules suggesting metastatic progression.  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 - Pt pursuing palliative radiation, will go to [facility] today for MRI with sedation and likely to start palliative radiation in next 1-2 days Dr Lisbeth Renshaw  Metastatic adenocarcinoma of the lung to the vertebrae -pt open to considering palliative radiation options -consulted Dr. Alvy Bimler and spoke with her about patient.   Anxiety -Increased Ativan to 0.5 mg every 6 hours when necessary anxiety  Pulmonary embolus history -Diagnosed Aug 2017 -Continue rivaroxaban -Clinically stable at present  COPD -Stable presently on room air without exacerbation -continue Anoro -albuterol prn dyspnea  Loose Stool -This was caused by the patient taking milk of magnesia on 3 consecutive days -Monitor clinically  Consultants:  Palliative medicine  Subjective: Pt reporting that her pain is controlled this morning.    Objective: Vitals:   03/18/16 1500 03/18/16 2045 03/19/16 0517 03/19/16 0843  BP: (!) 150/77 126/62 140/75   Pulse: 66 60 63   Resp: '18 19 16   '$ Temp: 98.6 F (37 C) 98.8 F (37.1 C) 98.3 F (36.8 C)   TempSrc: Oral Oral Oral   SpO2: 94% 94% 94% 96%  Weight:      Height:        Intake/Output Summary (Last  24 hours) at 03/19/16 0857 Last data filed at 03/18/16 1500  Gross per 24 hour  Intake              360 ml  Output                0 ml  Net              360 ml   Filed Weights   03/16/16 1059  Weight: 107.5 kg (237 lb)    Exam:  General:  A&O x 3, NAD, nontoxic, pleasant/cooperative Head/Eye: No conjunctival hemorrhage, no icterus, [place]/AT, No nystagmus ENT:  No icterus,  No thrush, good dentition, no pharyngeal exudate Neck:  No masses, no lymphadenpathy, no bruits CV:  RRR, no rub, no gallop, no S3 Lung:  CTAB, good air movement, no wheeze, no rhonchi Abdomen: soft/NT, +BS, nondistended, no peritoneal signs Ext: No cyanosis, No rashes, No petechiae, No lymphangitis, No edema Neuro: CNII-XII intact, strength 4/5 in bilateral upper and lower extremities, no dysmetria  Data Reviewed: Basic Metabolic Panel:  Recent Labs Lab 03/16/16 1109 03/17/16 0421  NA 140 141  K 3.8 4.1  CL 107 106  CO2 24 29  GLUCOSE 154* 135*  BUN 11 12  CREATININE 0.96 0.92  CALCIUM 9.1 8.7*   Liver Function Tests: No results for input(s): AST, ALT, ALKPHOS, BILITOT, PROT, ALBUMIN in the last 168 hours. No results for input(s): LIPASE, AMYLASE in the last 168 hours. No results for input(s): AMMONIA in the last 168 hours. CBC:  Recent Labs Lab 03/16/16 1109  WBC 7.7  NEUTROABS 5.7  HGB 12.5  HCT 40.4  MCV 92.0  PLT 215   Cardiac Enzymes: No results for input(s): CKTOTAL, CKMB, CKMBINDEX, TROPONINI in the last 168 hours. CBG (last 3)  No results for input(s): GLUCAP in the last 72 hours. No results found for this or any previous visit (from the past 240 hour(s)).   Studies: No results found.   Scheduled Meds: . famotidine  20 mg Oral BID  . guaiFENesin  600 mg Oral BID  . levothyroxine  200 mcg Oral QAC breakfast  . lidocaine  1 patch Transdermal Q24H  . ondansetron (ZOFRAN) IV  4 mg Intravenous Q6H  . oxyCODONE  20 mg Oral Q12H  . polyethylene glycol  17 g Oral Daily  .  predniSONE  10 mg Oral Q breakfast  . rivaroxaban  20 mg Oral Q supper  . umeclidinium-vilanterol  1 puff Inhalation Daily   Continuous Infusions:  Active Problems:   Pathologic compression fracture of vertebra ([facility])   Other specified hypothyroidism   Pulmonary embolism ([facility])   Lung cancer, primary, with metastasis from lung to other site, left [facility])   Uncontrolled pain   Cancer related pain   Palliative care encounter  Time spent:   Irwin Brakeman, MD, FAAFP [facility] Pager [phone]  If 7PM-7AM, please contact night-coverage [email or web address] Password [email or web address] 03/19/2016, 8:57 AM    LOS: 3 days

## 2016-03-19 NOTE — Anesthesia Procedure Notes (Signed)
Procedure Name: Intubation Date/Time: 03/19/2016 3:10 PM Performed by: Rejeana Brock L Pre-anesthesia Checklist: Patient identified, Emergency Drugs available, Suction available and Patient being monitored Patient Re-evaluated:Patient Re-evaluated prior to inductionOxygen Delivery Method: Circle System Utilized Preoxygenation: Pre-oxygenation with 100% oxygen Intubation Type: IV induction Ventilation: Mask ventilation without difficulty Laryngoscope Size: Glidescope and 3 Grade View: Grade I Tube type: Oral Tube size: 7.0 mm Number of attempts: 1 Airway Equipment and Method: Stylet and Oral airway Placement Confirmation: ETT inserted through vocal cords under direct vision,  positive ETCO2 and breath sounds checked- equal and bilateral Secured at: 22 cm Tube secured with: Tape Dental Injury: Teeth and Oropharynx as per pre-operative assessment  Comments: Elective glidescope due to mallampati IV, large tongue, reduced neck mobility, and decreased oral opening

## 2016-03-19 NOTE — Progress Notes (Signed)
Initial Nutrition Assessment  DOCUMENTATION CODES:   Obesity unspecified  INTERVENTION:   -RD will follow for diet advancement and supplement as appropriate  NUTRITION DIAGNOSIS:   Inadequate oral intake related to nausea, inability to eat as evidenced by NPO status.  GOAL:   Patient will meet greater than or equal to 90% of their needs  MONITOR:   Diet advancement, Labs, Weight trends, Skin, I & O's, PO intake  REASON FOR ASSESSMENT:   Consult Assessment of nutrition requirement/status  ASSESSMENT:   Jasmine Grant is a 61 y.o. female with medical history of pulmonary embolus, metastatic adenocarcinoma of the lung, and anxiety presented with one-week history of worsening neck and upper back pain. The patient is followed by Dr. Alvy Bimler. The patient had a recent CT of her chest on 01/03/2016 which showed interval progression of bilateral pulmonary nodules suggesting metastatic progression.  Pt admitted with uncontrolled cancer related pain. Pt with metastatic adenocarcinoma of lung to vertebrae. Pt may consider palliative radiation.   Pt in with MD and then out of room for MRI at times of visits.   Reviewed wt hx, which reveals UBW of 235#. Per [name] RD notes, pt weighed 232# on last visit in July 2017.   Pt was previously on a regular diet; PO: 85-100%. Pt has experienced some nausea, which she attributes to oxycontin use.   Palliative care team following for goals of care; pt would like to talk with radiation oncology prior to continuing [place] discussions.   Labs reviewed.   Diet Order:  Diet NPO time specified  Skin:  Reviewed, no issues  Last BM:  03/15/16  Height:   Ht Readings from Last 1 Encounters:  03/16/16 '6\' 2"'$  (1.88 m)    Weight:   Wt Readings from Last 1 Encounters:  03/16/16 237 lb (107.5 kg)    Ideal Body Weight:  77.2 kg  BMI:  Body mass index is 30.43 kg/m.  Estimated Nutritional Needs:   Kcal:  1900-2100  Protein:  95-110  grams  Fluid:  1.9-2.1 L  EDUCATION NEEDS:   No education needs identified at this time  Mirely Pangle A. Jimmye Norman, RD, LDN, CDE Pager: [phone] After hours Pager: [phone]

## 2016-03-19 NOTE — OR Nursing (Signed)
[facility] at bedside to transport patient back to [facility].  Handoff report given.  Report called to [place] at [employer].

## 2016-03-19 NOTE — Anesthesia Preprocedure Evaluation (Signed)
Anesthesia Evaluation  Patient identified by MRN, date of birth, ID band Patient awake    Reviewed: NPO status , Patient's Chart, lab work & pertinent test results  Airway Mallampati: II  TM Distance: >3 FB Neck ROM: Limited    Dental  (+) Teeth Intact, Dental Advisory Given   Pulmonary COPD, former smoker,  Known metastatic disease c pulm nodulesl   breath sounds clear to auscultation       Cardiovascular hypertension,  Rhythm:Regular Rate:Normal     Neuro/Psych    GI/Hepatic   Endo/Other    Renal/GU Renal disease     Musculoskeletal  (+) Arthritis ,   Abdominal (+) + obese,   Peds  Hematology  (+) anemia ,   Anesthesia Other Findings   Reproductive/Obstetrics                             Anesthesia Physical Anesthesia Plan  ASA: IV  Anesthesia Plan: General   Post-op Pain Management:    Induction: Intravenous  Airway Management Planned: Oral ETT  Additional Equipment:   Intra-op Plan:   Post-operative Plan: Extubation in OR  Informed Consent: I have reviewed the patients History and Physical, chart, labs and discussed the procedure including the risks, benefits and alternatives for the proposed anesthesia with the patient or authorized representative who has indicated his/her understanding and acceptance.   Dental advisory given  Plan Discussed with:   Anesthesia Plan Comments:         Anesthesia Quick Evaluation

## 2016-03-19 NOTE — Progress Notes (Signed)
Daily Progress Note   Patient Name: Jasmine Grant       Date: 03/19/2016 DOB: Oct 05, 1955  Age: 61 y.o. MRN#: [id number] Attending Physician: Murlean Iba, MD Primary Care Physician: Reginia Naas, MD Admit Date: 03/16/2016  Reason for Consultation/Follow-up: Establishing goals of care, Non pain symptom management, Pain control and Psychosocial/spiritual support  Subjective: - meet with patient at her bedside, Ms Ayotte and I meet a few weeks ago in the OP radiation-oncology clinics  - I shared with Ms Cegielski that this morning in tumor board discussion was to move forward with MRI under sedation while she is IP per Dr Lisbeth Renshaw and she is on board,  she is hopeful this will enable her to  receive palliative radiation in hopes of achieving some improved relief from her cancer pain.  - we discussed her disease progression and implementation of hospice services on discharge, which she is open to   Length of Stay: 3  Current Medications: Scheduled Meds:  . famotidine  20 mg Oral BID  . guaiFENesin  600 mg Oral BID  . levothyroxine  200 mcg Oral QAC breakfast  . lidocaine  1 patch Transdermal Q24H  . ondansetron (ZOFRAN) IV  4 mg Intravenous Q6H  . oxyCODONE  20 mg Oral Q12H  . polyethylene glycol  17 g Oral Daily  . predniSONE  10 mg Oral Q breakfast  . rivaroxaban  20 mg Oral Q supper  . umeclidinium-vilanterol  1 puff Inhalation Daily    Continuous Infusions:   PRN Meds: acetaminophen **OR** acetaminophen, albuterol, alum & mag hydroxide-simeth, LORazepam, morphine injection, ondansetron (ZOFRAN) IV, oxyCODONE, promethazine, sodium chloride flush, temazepam  Physical Exam  Constitutional: She appears well-developed. She appears ill.  HENT:  Mouth/Throat: Oropharynx is  clear and moist.  Cardiovascular: Normal rate, regular rhythm and normal heart sounds.   Pulmonary/Chest: Effort normal and breath sounds normal.  Skin: Skin is warm and dry.             Vital Signs: BP 140/75 (BP Location: Right Arm)   Pulse 63   Temp 98.3 F (36.8 C) (Oral)   Resp 16   Ht '6\' 2"'$  (1.88 m)   Wt 107.5 kg (237 lb)   SpO2 94%   BMI 30.43 kg/m  SpO2: SpO2: 94 % O2 Device: O2 Device:  Not Delivered O2 Flow Rate:    Intake/output summary:   Intake/Output Summary (Last 24 hours) at 03/19/16 0806 Last data filed at 03/18/16 1500  Gross per 24 hour  Intake              360 ml  Output                0 ml  Net              360 ml   LBM: Last BM Date: 03/15/16 Baseline Weight: Weight: 107.5 kg (237 lb) Most recent weight: Weight: 107.5 kg (237 lb)       Palliative Assessment/Data: 70%    Flowsheet Rows   Flowsheet Row Most Recent Value  Intake Tab  Referral Department  Hospitalist  Unit at Time of Referral  Med/Surg Unit  Palliative Care Primary Diagnosis  Cancer  Date Notified  03/16/16  Palliative Care Type  New Palliative care  Reason for referral  Clarify Goals of Care  Date of Admission  03/16/16  # of days IP prior to Palliative referral  0  Clinical Assessment  Psychosocial & Spiritual Assessment  Palliative Care Outcomes      Patient Active Problem List   Diagnosis Date Noted  . Palliative care encounter   . Uncontrolled pain 03/16/2016  . Cancer related pain   . Lung cancer, primary, with metastasis from lung to other site, left ([place])   . Cancer associated pain 02/06/2016  . Atrial fibrillation, transient ([place]) 11/30/2015  . Goals of care, counseling/discussion 10/19/2015  . Atypical chest pain 10/05/2015  . Dizziness 10/05/2015  . Pulmonary embolism ([place]) 09/15/2015  . Pulmonary embolus ([place]) 09/15/2015  . Radiation pneumonitis ([place]) 08/18/2015  . DNR (do not resuscitate) 08/18/2015  . Acute bronchitis due to infection 07/27/2015  .  Leukocytosis 07/07/2015  . Headache disorder 06/08/2015  . UTI (lower urinary tract infection) 05/26/2015  . Drug-induced hypokalemia 05/26/2015  . Dehydration 05/26/2015  . Chemotherapy-induced nausea 05/26/2015  . Palliative care by specialist 05/18/2015  . Thrombocytopenia ([place]) 02/24/2015  . Other specified hypothyroidism 02/03/2015  . Numbness and tingling of right arm 01/12/2015  . Rash 11/26/2014  . COPD (chronic obstructive pulmonary disease) ([place]) 11/25/2014  . Pancytopenia due to antineoplastic chemotherapy ([place]) 11/10/2014  . Neuropathy due to chemotherapeutic drug ([place]) 11/10/2014  . Dysuria 11/03/2014  . Cough 10/06/2014  . 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 Protein calorie malnutrition ([place]) 08/26/2014  . Elevated serum creatinine 08/26/2014  . Right shoulder pain 06/17/2014  . 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 Arthritis 04/27/2014  . Bilateral leg edema 04/27/2014  . Sinusitis, chronic 04/27/2014  . Essential hypertension 04/27/2014  . Other constipation 04/27/2014  . Cellulitis 04/20/2014  . Anemia due to antineoplastic chemotherapy 03/16/2014  . Cyst of skin 02/26/2014  . Other fatigue 02/26/2014  . Leukopenia due to antineoplastic chemotherapy ([place]) 01/28/2014  . Radiation-induced dermatitis 01/28/2014  . Esophagitis 01/07/2014  . Hypersensitivity reaction 01/04/2014  . Insomnia 12/29/2013  . Constipation due to opioid therapy 12/17/2013  . Adenocarcinoma of upper lobe of right lung   . Nausea with vomiting 12/16/2013  . Elevated blood pressure 12/15/2013  . Generalized anxiety disorder 12/14/2013  . Acquired hypothyroidism 12/14/2013  . Overweight (BMI 25.0-29.9) 12/14/2013  . Epidural mass 12/13/2013  . 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 Pathologic compression fracture of vertebra [facility])     Palliative Care Assessment & Plan   Patient Profile: 61 y.o. female  with past medical  history of Pulmonary embolus, metastatic adenocarcinoma lung (including  metastatic disease to several segments of the spine with a plan for radiation which is pending a repeat MRI), anxiety, and COPD admitted on 03/16/2016 with uncontrolled pain. Palliative consulted for symptom management and goals of care discussion.  Recommendations/Plan:  MRI under sedation for evaluation of palliative radiation  Pain: Continue with current medication, patient tells me this morning that her medications "are holding the pain"  Continued support helping patient navigate her treatment options and discharge plan in context of terminal illness   Code Status:    Code Status Orders        Start     Ordered   03/16/16 1546  Do not attempt resuscitation (DNR)  Continuous    Question Answer Comment  In the event of cardiac or respiratory ARREST Do not call a "code blue"   In the event of cardiac or respiratory ARREST Do not perform Intubation, CPR, defibrillation or ACLS   In the event of cardiac or respiratory ARREST Use medication by any route, position, wound care, and other measures to relive pain and suffering. May use oxygen, suction and manual treatment of airway obstruction as needed for comfort.      03/16/16 1545    Code Status History    Date Active Date Inactive Code Status Order ID Comments User Context   09/15/2015 10:51 PM 09/17/2015  3:37 PM Full Code [id number]  Lily Kocher, MD Inpatient   03/29/2014  5:14 PM 03/30/2014  3:49 AM Full Code [id number]  Markus Daft, MD HOV   12/14/2013  4:23 PM 12/17/2013 11:52 PM Full Code [id number]  Carylon Perches, MD Inpatient   12/14/2013 12:08 AM 12/14/2013  4:23 PM Full Code [id number]  Theressa Millard, MD Inpatient    Advance Directive Documentation   Flowsheet Row Most Recent Value  Type of Advance Directive  Healthcare Power of Attorney, Living will  Pre-existing out of facility DNR order (yellow form or pink MOST form)  No data  "MOST" Form in Place?  No data       Prognosis:   Unable to determine  Discharge  Planning:  To Be Determined  Care plan was discussed with  Dr Lisbeth Renshaw and Dr Wynetta Emery  Thank you for allowing the Palliative Medicine Team to assist in the care of this patient.   Time in 0800    Time out 0835    Total time 35 minutes  PMT will f/u in the morning     Greater than 50%  of this time was spent counseling and coordinating care related to the above assessment and plan.  Wadie Lessen, NP  Please contact Palliative Medicine Team phone at [phone] for questions and concerns.

## 2016-03-19 NOTE — Transfer of Care (Signed)
Immediate Anesthesia Transfer of Care Note  Patient: Jasmine Grant  Procedure(s) Performed: Procedure(s): RADIOLOGY WITH ANESTHESIA (N/A)  Patient Location: PACU  Anesthesia Type:General  Level of Consciousness: awake and alert   Airway & Oxygen Therapy: Patient Spontanous Breathing and Patient connected to nasal cannula oxygen  Post-op Assessment: Report given to RN, Post -op Vital signs reviewed and stable and Patient moving all extremities X 4  Post vital signs: Reviewed and stable  Last Vitals:  Vitals:   03/19/16 1714 03/19/16 1715  BP: (!) 109/98   Pulse: 72 72  Resp: 13 19  Temp: 36.4 C     Last Pain:  Vitals:   03/19/16 1714  TempSrc:   PainSc: 4       Patients Stated Pain Goal: 1 (81/38/87 1959)  Complications: No apparent anesthesia complications

## 2016-03-20 ENCOUNTER — Ambulatory Visit
Admit: 2016-03-20 | Discharge: 2016-03-20 | Disposition: A | Discharge status: Home / Self Care | Payer: BLUE CROSS/BLUE SHIELD | Attending: Radiation Oncology | Admitting: Radiation Oncology

## 2016-03-20 ENCOUNTER — Telehealth: Payer: Self-pay | Admitting: *Deleted

## 2016-03-20 ENCOUNTER — Encounter (HOSPITAL_COMMUNITY): Payer: Self-pay | Admitting: Radiology

## 2016-03-20 DIAGNOSIS — C7951 Secondary malignant neoplasm of bone: Secondary | ICD-10-CM

## 2016-03-20 DIAGNOSIS — Z5189 Encounter for other specified aftercare: Secondary | ICD-10-CM

## 2016-03-20 DIAGNOSIS — C3411 Malignant neoplasm of upper lobe, right bronchus or lung: Secondary | ICD-10-CM

## 2016-03-20 DIAGNOSIS — M542 Cervicalgia: Secondary | ICD-10-CM

## 2016-03-20 DIAGNOSIS — C3492 Malignant neoplasm of unspecified part of left bronchus or lung: Secondary | ICD-10-CM

## 2016-03-20 DIAGNOSIS — M4850XS Collapsed vertebra, not elsewhere classified, site unspecified, sequela of fracture: Secondary | ICD-10-CM

## 2016-03-20 DIAGNOSIS — Z510 Encounter for antineoplastic radiation therapy: Secondary | ICD-10-CM | POA: Diagnosis not present

## 2016-03-20 MED ORDER — LORAZEPAM 1 MG PO TABS
1.0000 mg | ORAL_TABLET | Freq: Four times a day (QID) | ORAL | Status: DC | PRN
  Administered 2016-03-20: 1 mg via ORAL
  Filled 2016-03-20: qty 1

## 2016-03-20 NOTE — Progress Notes (Signed)
Jasmine Grant   DOB:1955-03-29   GM#:[id number]    Patient is well known to me.  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      12/13/2013 Imaging    CT scan show large mass in the right lung apex likely representing primary lung carcinoma or large metastasis. Metastases demonstrated in the pretracheal lymph nodes, T1 vertebral body and possibly sacrum.      12/14/2013 Initial Diagnosis    Adenocarcinoma of upper lobe of right lung      12/14/2013 Pathology Results    Accession: [id number] biopsy from right upper lobe showed adenocarcinoma. [employer] testing was positive for several mutations without any known treatment options now      12/17/2013 Imaging    MRI of the head is negative.      12/23/2013 - 02/05/2014 Radiation Therapy    She received radiation therapy to the mediastinal mass and bone      12/28/2013 Imaging    PET/CT scan showed mediastinal mass, lymphadenopathy and T1 involvement.      01/01/2014 - 01/29/2014 Chemotherapy    Weekly carboplatin and Taxol were added      03/12/2014 Imaging    Repeat PET scan showed near complete response to treatment.      03/16/2014 - 05/28/2014 Chemotherapy    She started treatment with Alimta for maintenance      07/07/2014 Imaging    PET scan showed disease progression      07/15/2014 - 09/23/2014 Chemotherapy    She received palliative treatment with Opdivo      08/13/2014 - 08/25/2014 Radiation Therapy    She completed palliative radiation therapy to the  bone.      10/05/2014 Imaging    PET CT showed disease progression except for site of recent radiation therapy      10/13/2014 - 04/06/2015 Chemotherapy    She received Rx with weekly carbo/taxol, 2 weeks on 1 week off with Avastin every 3 weeks      12/15/2014 Imaging    PET scan showed positive response to Rx      01/18/2015 Imaging    Ct chest showed disease improvement      04/19/2015 Imaging    Ct chest diease progression      04/27/2015 - 12/07/2015 Chemotherapy    Treatment is switched to Gemzar and Navelbine      05/04/2015 Adverse Reaction    Treatment was missed & interrupted due to recurrent UTI      06/13/2015 Imaging    MRI head is negative      08/17/2015 Imaging    CT imaging showed positive response to Rx      09/15/2015 - 09/17/2015 Hospital Admission    She was admitted to the hospital due to diagnosis of acute pulmonary embolism      09/15/2015 Imaging    Ct angiogram showed acute PE      01/03/2016 Imaging    Interval progression of bilateral  pulmonary nodules suggesting metastatic progression. 2. Subcarinal lymphadenopathy seen previously has decreased in the interval. 3. Thoracoabdominal aortic atherosclerosis. 4. Stable sclerotic changes in C7 and T1, potentially from prior radiation treatment although prior metastatic disease is a consideration.       Subjective: She felt her pain is better controlled since she has been hospitalized.  She said that Dilaudid is now making her sick but she is able to tolerate oxycodone and morphine as needed MRI is pending.  She has made multiple statements that she does not want to go for conventional chemotherapy that was prescribed for fear of side effects.  She wants to share with me she has read some vaccine trial on mice performed at [facility] and she is interested to know if she would qualify for another clinical trial that might not make her sick.  She shared with me she is hoping for some miracles  Objective:   Vitals:   03/19/16 2054 03/20/16 0429  BP: 119/80 (!) 120/57  Pulse: 77 (!) 57  Resp: 18 18  Temp: 98.5 F (36.9 C) 98 F (36.7 C)     Intake/Output Summary (Last 24 hours) at 03/20/16 0826 Last data filed at 03/20/16 0800  Gross per 24 hour  Intake                0 ml  Output              100 ml  Net             -100 ml    GENERAL:alert, no distress and comfortable.  She is morbidly obese and appear cushingoid SKIN: skin color, texture, turgor are normal, no rashes or significant lesions EYES: normal, Conjunctiva are pink and non-injected, sclera clear Musculoskeletal:no cyanosis of digits and no clubbing  NEURO: alert & oriented x 3 with fluent speech, no focal motor/sensory deficits   Labs:  Lab Results  Component Value Date   WBC 7.7 03/16/2016   HGB 12.5 03/16/2016   HCT 40.4 03/16/2016   MCV 92.0 03/16/2016   PLT 215 03/16/2016   NEUTROABS 5.7 03/16/2016    Lab Results  Component Value Date   NA 141 03/17/2016   K 4.1 03/17/2016   CL 106 03/17/2016   CO2 29 03/17/2016   MRI result is pending but I have reviewed the imaging study which showed new bone lesions affecting the cervical spine Assessment & Plan:   Adenocarcinoma of upper lobe of right lung with radiographic imaging study showing disease progression in her bones She had near worsening back pain. The patient has made an informed decision not to pursue conventional palliative chemotherapy. Previously, she was in agreement to get enroll in hospice care.  However, she somewhat rescinded the idea of symptom management and palliative care only and now wants everything done because she wants to "keep fighting" I spent some time explaining to her that she is not a candidate for clinical trial due to significant morbidity with uncontrolled pain.  She also had very poor understanding about the role of palliative chemotherapy. There are no curative intent treatment out there. Most treatment can be associated  with side effects and could impair her quality of life. I tried to explain to the patient the different phases of clinical trial. The patient becomes very distressed upon discussion about systemic treatment.  I felt that she may have unrealistic expectation about goals of treatment and goals of care.  She states she is still hoping for some  miracle. I appreciate palliative care assistance to clarify with the patient goals of care  Pathologic compression fracture of spine She is known to have pathological fracture of spine She has new, worsening bone pain due to new fractures MRI report is pending but based on my assessment, there are new lesions that she may benefit from short course XRT  Goals of care, counseling/discussion We discussed the goals of care. The patient has progressed to multiple lines of treatment and she is miserable with chronic persistent respiratory symptoms.  I appreciate palliative care assistance to further clarify goals of care with her  Cancer associated pain She is adamant pain medications made her sick in the past She has felt oxycodone did not help in the past and so we switch her to Dilaudid. The patient is currently convinced that Dilaudid is making her sick and she does not want to be on Dilaudid anymore. Again, appreciate palliative care consult on pain management.  She has taken steroids in the past without much success of helping her with bone pain  Discharge planning Not ready for discharge due to persistent symptoms of uncontrolled pain   [name], MD 03/20/2016  8:26 AM

## 2016-03-20 NOTE — Progress Notes (Signed)
Daily Progress Note   Patient Name: Jasmine Grant       Date: 03/20/2016 DOB: 03-13-1955  Age: 61 y.o. MRN#: [id number] Attending Physician: Murlean Iba, MD Primary Care Physician: Reginia Naas, MD Admit Date: 03/16/2016  Reason for Consultation/Follow-up: Establishing goals of care, Non pain symptom management, Pain control and Psychosocial/spiritual support  Subjective:  - meet with patient at her bedside to discuss current medial situation and a patient centered care plan  -her plan is to move forward with  palliative radiation in hopes of achieving some improved relief from her cancer pain  -current mediations are controlling her pain at an acceptable level  - Dr Alvy Bimler was at the bedside and reviewed with Jasmine Grant clinical trials and eligibility criteria, patient always has in the back of her mind the possibility of a new treatment for cure, "a miracle"  -  we discussed her disease progression and implementation of hospice services on discharge, which she is open to   Length of Stay: 4  Current Medications: Scheduled Meds:  . famotidine  20 mg Oral BID  . guaiFENesin  600 mg Oral BID  . levothyroxine  200 mcg Oral QAC breakfast  . lidocaine  1 patch Transdermal Q24H  . ondansetron (ZOFRAN) IV  4 mg Intravenous Q6H  . oxyCODONE  20 mg Oral Q12H  . polyethylene glycol  17 g Oral Daily  . predniSONE  10 mg Oral Q breakfast  . rivaroxaban  20 mg Oral Q supper  . umeclidinium-vilanterol  1 puff Inhalation Daily    Continuous Infusions:   PRN Meds: acetaminophen **OR** acetaminophen, albuterol, alum & mag hydroxide-simeth, LORazepam, morphine injection, ondansetron (ZOFRAN) IV, oxyCODONE, promethazine, sodium chloride flush, temazepam  Physical Exam    Constitutional: She appears well-developed. She appears ill.  HENT:  Mouth/Throat: Oropharynx is clear and moist.  Cardiovascular: Normal rate, regular rhythm and normal heart sounds.   Pulmonary/Chest: Effort normal and breath sounds normal.  Skin: Skin is warm and dry.             Vital Signs: BP (!) 120/57 (BP Location: Right Arm)   Pulse 84   Temp 98 F (36.7 C) (Oral)   Resp 18   Ht '6\' 2"'$  (1.88 m)   Wt 107.5 kg (237 lb)   SpO2 95%  BMI 30.43 kg/m  SpO2: SpO2: 95 % O2 Device: O2 Device: Not Delivered O2 Flow Rate: O2 Flow Rate (L/min): 2 L/min  Intake/output summary:   Intake/Output Summary (Last 24 hours) at 03/20/16 1004 Last data filed at 03/20/16 0800  Gross per 24 hour  Intake                0 ml  Output              100 ml  Net             -100 ml   LBM: Last BM Date: 03/16/16 Baseline Weight: Weight: 107.5 kg (237 lb) Most recent weight: Weight: 107.5 kg (237 lb)       Palliative Assessment/Data: 70%    Flowsheet Rows   Flowsheet Row Most Recent Value  Intake Tab  Referral Department  Hospitalist  Unit at Time of Referral  Med/Surg Unit  Palliative Care Primary Diagnosis  Cancer  Date Notified  03/16/16  Palliative Care Type  New Palliative care  Reason for referral  Clarify Goals of Care  Date of Admission  03/16/16  Date first seen by Palliative Care  03/17/16  # of days Palliative referral response time  1 Day(s)  # of days IP prior to Palliative referral  0  Clinical Assessment  Psychosocial & Spiritual Assessment  Palliative Care Outcomes      Patient Active Problem List   Diagnosis Date Noted  . Palliative care encounter   . Uncontrolled pain 03/16/2016  . Cancer related pain   . Lung cancer, primary, with metastasis from lung to other site, left ([place])   . Cancer associated pain 02/06/2016  . Atrial fibrillation, transient ([place]) 11/30/2015  . Goals of care, counseling/discussion 10/19/2015  . Atypical chest pain 10/05/2015  .  Dizziness 10/05/2015  . Pulmonary embolism ([place]) 09/15/2015  . Pulmonary embolus ([place]) 09/15/2015  . Radiation pneumonitis ([place]) 08/18/2015  . DNR (do not resuscitate) 08/18/2015  . Acute bronchitis due to infection 07/27/2015  . Leukocytosis 07/07/2015  . Headache disorder 06/08/2015  . UTI (lower urinary tract infection) 05/26/2015  . Drug-induced hypokalemia 05/26/2015  . Dehydration 05/26/2015  . Chemotherapy-induced nausea 05/26/2015  . Palliative care by specialist 05/18/2015  . Thrombocytopenia ([place]) 02/24/2015  . Other specified hypothyroidism 02/03/2015  . Numbness and tingling of right arm 01/12/2015  . Rash 11/26/2014  . COPD (chronic obstructive pulmonary disease) ([place]) 11/25/2014  . Pancytopenia due to antineoplastic chemotherapy ([place]) 11/10/2014  . Neuropathy due to chemotherapeutic drug ([place]) 11/10/2014  . Dysuria 11/03/2014  . Cough 10/06/2014  . 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 Protein calorie malnutrition ([place]) 08/26/2014  . Elevated serum creatinine 08/26/2014  . Right shoulder pain 06/17/2014  . 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 Arthritis 04/27/2014  . Bilateral leg edema 04/27/2014  . Sinusitis, chronic 04/27/2014  . Essential hypertension 04/27/2014  . Other constipation 04/27/2014  . Cellulitis 04/20/2014  . Anemia due to antineoplastic chemotherapy 03/16/2014  . Cyst of skin 02/26/2014  . Other fatigue 02/26/2014  . Leukopenia due to antineoplastic chemotherapy ([place]) 01/28/2014  . Radiation-induced dermatitis 01/28/2014  . Esophagitis 01/07/2014  . Hypersensitivity reaction 01/04/2014  . Insomnia 12/29/2013  . Constipation due to opioid therapy 12/17/2013  . Adenocarcinoma of upper lobe of right lung   . Nausea with vomiting 12/16/2013  . Elevated blood pressure 12/15/2013  . Generalized anxiety disorder 12/14/2013  . Acquired hypothyroidism 12/14/2013  . Overweight (BMI 25.0-29.9) 12/14/2013  . Epidural  mass 12/13/2013  . SVC syndrome  12/13/2013  . Pathologic compression fracture of vertebra [facility])     Palliative Care Assessment & Plan   Patient Profile: 61 y.o. 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 Palliative consulted for symptom management and goals of care discussion.  Recommendations/Plan:  palliative radiation for comfort  Pain: Continue with current medication, patient tells me this morning that her medications "are holding the pain"  Continued support helping patient navigate her treatment options and discharge plan in context of terminal illness   Code Status:    Code Status Orders        Start     Ordered   03/16/16 1546  Do not attempt resuscitation (DNR)  Continuous    Question Answer Comment  In the event of cardiac or respiratory ARREST Do not call a "code blue"   In the event of cardiac or respiratory ARREST Do not perform Intubation, CPR, defibrillation or ACLS   In the event of cardiac or respiratory ARREST Use medication by any route, position, wound care, and other measures to relive pain and suffering. May use oxygen, suction and manual treatment of airway obstruction as needed for comfort.      03/16/16 1545    Code Status History    Date Active Date Inactive Code Status Order ID Comments User Context   09/15/2015 10:51 PM 09/17/2015  3:37 PM Full Code [id number]  Lily Kocher, MD Inpatient   03/29/2014  5:14 PM 03/30/2014  3:49 AM Full Code [id number]  Markus Daft, MD HOV   12/14/2013  4:23 PM 12/17/2013 11:52 PM Full Code [id number]  Carylon Perches, MD Inpatient   12/14/2013 12:08 AM 12/14/2013  4:23 PM Full Code [id number]  Theressa Millard, MD Inpatient    Advance Directive Documentation   Flowsheet Row Most Recent Value  Type of Advance Directive  Healthcare  Power of Attorney, Living will  Pre-existing out of facility DNR order (yellow form or pink MOST form)  No data  "MOST" Form in Place?  No data      Prognosis:   Less than 6 months  Discharge Planning:  To Be Determined  Care plan was discussed with  Dr Lisbeth Renshaw and Dr Alvy Bimler  Thank you for allowing the Palliative Medicine Team to assist in the care of this patient.   Time in 0730   Time out 0805    Total time 35 minutes  PMT will f/u in the morning     Greater than 50%  of this time was spent counseling and coordinating care related to the above assessment and plan.  Wadie Lessen, NP  Please contact Palliative Medicine Team phone at [phone] for questions and concerns.

## 2016-03-20 NOTE — Telephone Encounter (Signed)
called the floor for room 1520 patient, spoke with RN Abby, patient to have [place] today at 1pm for about 1 hour, asked if patient in pain to give pain medication, per Abby"yes and she will also get ativan prior " thanked Therapist, sports .      ct sim=spoke with Joellen Jersey ,gave updated stat su of patient 9:40 AM

## 2016-03-20 NOTE — Progress Notes (Signed)
PROGRESS NOTE    LILIYANA THOBE  OEU:[id number]  DOB: [birth year]  DOA: 03/16/2016 PCP: Reginia Naas, MD  Hospital course: DONNAJEAN CHESNUT is a 61 y.o. female with medical history of pulmonary embolus, metastatic adenocarcinoma of the lung, and anxiety presented with one-week history of worsening neck and upper back pain. The patient is followed by Dr. Alvy Bimler. The patient had a recent CT of her chest on 01/03/2016 which showed interval progression of bilateral pulmonary nodules suggesting metastatic progression.  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 - Pt pursuing palliative radiation today with Dr. 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 Alvy Bimler and spoke with her about patient - Please see her notes.   Anxiety -Increased Ativan to 1 mg every 6 hours when necessary anxiety  Pulmonary embolus history -Diagnosed Aug 2017 -Continue rivaroxaban -Clinically stable at present  COPD -Stable presently on room air without exacerbation -continue Anoro -albuterol prn dyspnea  Loose Stool -This was caused by the patient taking milk of magnesia on 3 consecutive days -Monitor clinically  Consultants:  Palliative medicine  Subjective: Pt asking for stronger ativan for her simulation and radiation treatments later today, pain controlled    Objective: Vitals:   03/19/16 1844 03/19/16 2054 03/20/16 0429 03/20/16 0911  BP: (!) 157/85 119/80 (!) 120/57   Pulse: 85 77 (!) 57 84  Resp: '18 18 18 18  '$ Temp: 97.5 F (36.4 C) 98.5 F (36.9 C) 98 F (36.7 C)   TempSrc: Oral Oral Oral   SpO2: 98% 94% 95% 95%  Weight:      Height:        Intake/Output Summary (Last 24  hours) at 03/20/16 1440 Last data filed at 03/20/16 0950  Gross per 24 hour  Intake              240 ml  Output              100 ml  Net              140 ml   Filed Weights   03/16/16 1059  Weight: 107.5 kg (237 lb)    Exam:  General:  A&O x 3, NAD, nontoxic, pleasant/cooperative Head/Eye: No conjunctival hemorrhage, no icterus, [place]/AT, No nystagmus ENT:  No icterus,  No thrush, good dentition, no pharyngeal exudate Neck:  No masses, no lymphadenpathy, no bruits CV:  RRR, no rub, no gallop, no S3 Lung:  CTAB, good air movement, no wheeze, no rhonchi Abdomen: soft/NT, +BS, nondistended, no peritoneal signs Ext: No cyanosis, No rashes, No petechiae, No lymphangitis, No edema Neuro: CNII-XII intact, strength 4/5 in bilateral upper and lower extremities, no dysmetria  Data Reviewed: Basic Metabolic Panel:  Recent Labs Lab 03/16/16 1109 03/17/16 0421  NA 140 141  K 3.8 4.1  CL 107 106  CO2 24 29  GLUCOSE 154* 135*  BUN 11 12  CREATININE 0.96 0.92  CALCIUM 9.1 8.7*   Liver Function Tests: No results for input(s): AST, ALT, ALKPHOS, BILITOT, PROT, ALBUMIN in the last 168 hours. No results for input(s): LIPASE, AMYLASE in the last 168 hours. No results for input(s): AMMONIA in the last 168 hours. CBC:  Recent Labs Lab 03/16/16 1109  WBC 7.7  NEUTROABS 5.7  HGB 12.5  HCT 40.4  MCV 92.0  PLT 215   Cardiac Enzymes: No results for input(s): CKTOTAL, CKMB, CKMBINDEX, TROPONINI in the last 168 hours. CBG (last 3)  No results for input(s): GLUCAP in the last 72 hours. No results found for this or any previous visit (from the past 240 hour(s)).   Studies: Mr Cervical Spine W Wo Contrast  Result Date: 03/20/2016 CLINICAL DATA:  61 year old female with metastatic lung cancer. Cervical and thoracic spine metastatic disease. Progressive neck and upper back pain. Pretreatment planning. Subsequent encounter. EXAM: MRI TOTAL SPINE WITHOUT AND WITH CONTRAST TECHNIQUE:  Multisequence MR imaging of the cervical and thoracic spine was performed prior to and following IV contrast administration for evaluation of spinal metastatic disease. CONTRAST:  26m MULTIHANCE GADOBENATE DIMEGLUMINE 529 MG/ML IV SOLN COMPARISON:  Thoracic Spine MRI 02/29/2016. FINDINGS: MRI CERVICAL SPINE FINDINGS Alignment: Straightening of cervical lordosis. Mild anterolisthesis of C3 on C4 and C4 on C5. Vertebrae: No occipital condyles or C1 metastatic disease. Possible patchy metastasis in the C2 spinous process. Confluent vertebral body and left posterior element metastatic disease throughout the C3 and C4 levels. Patchy metastatic disease in the mid C7 vertebral body. Cord: Abnormal cervical spine dural thickening and enhancement throughout (series 16, image 8). This continues into the dorsal upper thoracic spine. There is superimposed ventral and left lateral epidural tumor from C2-C3 to C6-C7. 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 Suspicion of patchy abnormal spinal cord signal on sagittal STIR imaging throughout the same levels (series 13 image 8). Posterior Fossa, vertebral arteries, paraspinal tissues: Questionable early leptomeningeal metastatic disease about the lower brainstem (series 16, image 8). No cerebellar leptomeningeal disease. No signal abnormality in the brainstem or visible cerebellum. Abnormal prevertebral/retropharyngeal space fluid throughout the cervical spine and continuing into the upper thoracic spine. Disc levels: Chronic cervical disc and endplate degeneration underlying the metastatic disease. In addition to so the cervical spinal stenosis and cord compression detailed above there is confluent epidural tumor obliterating the left C3, left C4, and left C5 neural foramina (series 8, images 17 through 27). MRI THORACIC SPINE FINDINGS  Alignment: Stable thoracic vertebral height and alignment since January. Vertebrae: Confluent T1 vertebral body, right costovertebral, and left pedicle metastasis with ventral epidural and early bilateral foraminal epidural tumor. Mild enlargement of ventral epidural tumor compared to 02/29/2016. Superimposed dorsal dural thickening and enhancement tracking inferiorly from the cervical spine. Tumor invasion of the lateral right first rib (series 10, image 26). No other thoracic vertebral metastatic disease. Cord: Effaced CSF from the thecal sac at the T1 level related to the ventral epidural tumor and dorsal dural thickening (series 9, image 19), but no cord compression at this time. Thoracic spinal cord signal elsewhere is normal. No abnormal thoracic intradural enhancement in the visualized upper thoracic levels (no postcontrast images of the lower thoracic spine today. Paraspinal and other soft tissues: Large 7-8 cm area of Pancoast type right apical lung tumor, inseparable from the right margin of the esophagus (series 10, image 34), the right T1 vertebra, and the right subclavian artery. Abnormal circumferential esophageal wall thickening in the upper thorax (series 5, image 11). Disc levels: Stable since 02/29/2016. IMPRESSION: 1. Confluent C3 and C4 body and left vertebral metastatic disease with epidural and left foraminal tumor. Superimposed abnormal generalized circumferential cervical dural thickening and enhancement. 2. Subsequent malignant cervical spinal  stenosis from the C3 to the C7 level with a mild level of cord compression and early suspected cord edema. See series 16, image 8. 3. Associated tumor obliteration of the left C3 through C5 neural foramina. 4. Confluent T1 vertebral, left pedicle, and right costovertebral junction tumor with epidural involvement but no bona fide cord compression at this level. Partial involvement of both T1 neural foramen with tumor. 5. Large Pancoast type right apical  lung mass with involvement of the esophagus and right proximal subclavian artery. 6. Questionable early leptomeningeal metastatic disease at the lower brainstem, but could be artifact as there is no spinal cord leptomeningeal disease identified. Repeat brain MRI without and with contrast would evaluate further. Electronically Signed   By: Genevie Ann M.D.   On: 03/20/2016 08:46   Mr Thoracic Spine W Wo Contrast  Result Date: 03/20/2016 CLINICAL DATA:  62 year old female with metastatic lung cancer. Cervical and thoracic spine metastatic disease. Progressive neck and upper back pain. Pretreatment planning. Subsequent encounter. EXAM: MRI TOTAL SPINE WITHOUT AND WITH CONTRAST TECHNIQUE: Multisequence MR imaging of the cervical and thoracic spine was performed prior to and following IV contrast administration for evaluation of spinal metastatic disease. CONTRAST:  1m MULTIHANCE GADOBENATE DIMEGLUMINE 529 MG/ML IV SOLN COMPARISON:  Thoracic Spine MRI 02/29/2016. FINDINGS: MRI CERVICAL SPINE FINDINGS Alignment: Straightening of cervical lordosis. Mild anterolisthesis of C3 on C4 and C4 on C5. Vertebrae: No occipital condyles or C1 metastatic disease. Possible patchy metastasis in the C2 spinous process. Confluent vertebral body and left posterior element metastatic disease throughout the C3 and C4 levels. Patchy metastatic disease in the mid C7 vertebral body. Cord: Abnormal cervical spine dural thickening and enhancement throughout (series 16, image 8). This continues into the dorsal upper thoracic spine. There is superimposed ventral and left lateral epidural tumor from C2-C3 to C6-C7. 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 Suspicion of patchy abnormal spinal cord signal on sagittal STIR imaging throughout the same levels (series 13 image 8). Posterior Fossa, vertebral  arteries, paraspinal tissues: Questionable early leptomeningeal metastatic disease about the lower brainstem (series 16, image 8). No cerebellar leptomeningeal disease. No signal abnormality in the brainstem or visible cerebellum. Abnormal prevertebral/retropharyngeal space fluid throughout the cervical spine and continuing into the upper thoracic spine. Disc levels: Chronic cervical disc and endplate degeneration underlying the metastatic disease. In addition to so the cervical spinal stenosis and cord compression detailed above there is confluent epidural tumor obliterating the left C3, left C4, and left C5 neural foramina (series 8, images 17 through 27). MRI THORACIC SPINE FINDINGS Alignment: Stable thoracic vertebral height and alignment since January. Vertebrae: Confluent T1 vertebral body, right costovertebral, and left pedicle metastasis with ventral epidural and early bilateral foraminal epidural tumor. Mild enlargement of ventral epidural tumor compared to 02/29/2016. Superimposed dorsal dural thickening and enhancement tracking inferiorly from the cervical spine. Tumor invasion of the lateral right first rib (series 10, image 26). No other thoracic vertebral metastatic disease. Cord: Effaced CSF from the thecal sac at the T1 level related to the ventral epidural tumor and dorsal dural thickening (series 9, image 19), but no cord compression at this time. Thoracic spinal cord signal elsewhere is normal. No abnormal thoracic intradural enhancement in the visualized upper thoracic levels (no postcontrast images of the lower thoracic spine today. Paraspinal and other soft tissues: Large 7-8 cm area of  Pancoast type right apical lung tumor, inseparable from the right margin of the esophagus (series 10, image 34), the right T1 vertebra, and the right subclavian artery. Abnormal circumferential esophageal wall thickening in the upper thorax (series 5, image 11). Disc levels: Stable since 02/29/2016. IMPRESSION:  1. Confluent C3 and C4 body and left vertebral metastatic disease with epidural and left foraminal tumor. Superimposed abnormal generalized circumferential cervical dural thickening and enhancement. 2. 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 See series 16, image 8. 3. Associated tumor obliteration of the left C3 through C5 neural foramina. 4. Confluent T1 vertebral, left pedicle, and right costovertebral junction tumor with epidural involvement but no bona fide cord compression at this level. Partial involvement of both T1 neural foramen with tumor. 5. Large Pancoast type right apical lung mass with involvement of the esophagus and right proximal subclavian artery. 6. Questionable early leptomeningeal metastatic disease at the lower brainstem, but could be artifact as there is no spinal cord leptomeningeal disease identified. Repeat brain MRI without and with contrast would evaluate further. Electronically Signed   By: Genevie Ann M.D.   On: 03/20/2016 08:46     Scheduled Meds: . famotidine  20 mg Oral BID  . guaiFENesin  600 mg Oral BID  . levothyroxine  200 mcg Oral QAC breakfast  . lidocaine  1 patch Transdermal Q24H  . ondansetron (ZOFRAN) IV  4 mg Intravenous Q6H  . oxyCODONE  20 mg Oral Q12H  . polyethylene glycol  17 g Oral Daily  . predniSONE  10 mg Oral Q breakfast  . rivaroxaban  20 mg Oral Q supper  . umeclidinium-vilanterol  1 puff Inhalation Daily   Continuous Infusions:  Active Problems:   Pathologic compression fracture of vertebra ([facility])   Other specified hypothyroidism   Pulmonary embolism ([facility])   Lung cancer, primary, with metastasis from lung to other site, left [facility])   Uncontrolled pain   Cancer related pain   Palliative care encounter  Time spent:   Irwin Brakeman, MD, FAAFP [facility] Pager [phone]  If 7PM-7AM, please contact  night-coverage [email or web address] Password [email or web address] 03/20/2016, 2:40 PM    LOS: 4 days

## 2016-03-21 ENCOUNTER — Ambulatory Visit: Payer: BLUE CROSS/BLUE SHIELD | Admitting: Radiation Oncology

## 2016-03-21 DIAGNOSIS — I2699 Other pulmonary embolism without acute cor pulmonale: Secondary | ICD-10-CM

## 2016-03-21 DIAGNOSIS — G893 Neoplasm related pain (acute) (chronic): Secondary | ICD-10-CM

## 2016-03-21 DIAGNOSIS — M542 Cervicalgia: Secondary | ICD-10-CM

## 2016-03-21 DIAGNOSIS — E038 Other specified hypothyroidism: Secondary | ICD-10-CM

## 2016-03-21 DIAGNOSIS — Z515 Encounter for palliative care: Secondary | ICD-10-CM

## 2016-03-21 DIAGNOSIS — C3492 Malignant neoplasm of unspecified part of left bronchus or lung: Secondary | ICD-10-CM

## 2016-03-21 LAB — INFLUENZA PANEL BY PCR (TYPE A & B)
INFLAPCR: NEGATIVE
INFLBPCR: NEGATIVE

## 2016-03-21 MED ORDER — AZITHROMYCIN 250 MG PO TABS: ORAL_TABLET | ORAL | 0 refills | Status: AC

## 2016-03-21 MED ORDER — HEPARIN SOD (PORK) LOCK FLUSH 100 UNIT/ML IV SOLN
500.0000 [IU] | INTRAVENOUS | Status: AC | PRN
  Administered 2016-03-21: 500 [IU]

## 2016-03-21 MED ORDER — ONDANSETRON 4 MG PO TBDP
4.0000 mg | ORAL_TABLET | Freq: Four times a day (QID) | ORAL | Status: DC
  Administered 2016-03-21 (×2): 4 mg via ORAL
  Filled 2016-03-21 (×2): qty 1

## 2016-03-21 MED ORDER — ENSURE ENLIVE PO LIQD
237.0000 mL | Freq: Two times a day (BID) | ORAL | Status: DC
  Administered 2016-03-21 (×2): 237 mL via ORAL

## 2016-03-21 MED ORDER — AZITHROMYCIN 250 MG PO TABS: 250.0000 mg | ORAL_TABLET | Freq: Every day | ORAL | Status: DC

## 2016-03-21 MED ORDER — OXYCODONE HCL ER 20 MG PO T12A: 20.0000 mg | EXTENDED_RELEASE_TABLET | Freq: Two times a day (BID) | ORAL | 0 refills | Status: AC

## 2016-03-21 MED ORDER — ONDANSETRON 4 MG PO TBDP: 4.0000 mg | ORAL_TABLET | Freq: Four times a day (QID) | ORAL | 0 refills | Status: AC

## 2016-03-21 MED ORDER — AZITHROMYCIN 250 MG PO TABS
500.0000 mg | ORAL_TABLET | Freq: Every day | ORAL | Status: AC
  Administered 2016-03-21: 500 mg via ORAL
  Filled 2016-03-21: qty 2

## 2016-03-21 MED ORDER — OXYCODONE HCL 5 MG PO TABS: 5.0000 mg | ORAL_TABLET | ORAL | 0 refills | Status: AC | PRN

## 2016-03-21 NOTE — Progress Notes (Signed)
Pt discharged from the unit via wheelchair. Discharge instructions were reviewed with the pt and family member. Prescriptions given to pt. No questions or concerns at this time.  Merek Niu W Carrol [place], RN

## 2016-03-21 NOTE — Progress Notes (Signed)
Daily Progress Note   Patient Name: Jasmine Grant       Date: 03/21/2016 DOB: 1955/01/31  Age: 61 y.o. MRN#: [id number] Attending Physician: Oswald Hillock, MD Primary Care Physician: Reginia Naas, MD Admit Date: 03/16/2016  Reason for Consultation/Follow-up: Establishing goals of care, Non pain symptom management, Pain control and Psychosocial/spiritual support  Subjective: - meet with patient at her bedside, continued conversation regarding GOCs, plan of care and anticipatory care needs  -patient will continue with palliative radiation on discharge, and is hopeful for hospice service on discharge   I will make a referral this morning   -  to  Continue with palliative radiation in hopes of achieving some improved relief from her cancer pain.    Length of Stay: 5  Current Medications: Scheduled Meds:  . famotidine  20 mg Oral BID  . guaiFENesin  600 mg Oral BID  . levothyroxine  200 mcg Oral QAC breakfast  . lidocaine  1 patch Transdermal Q24H  . ondansetron  4 mg Oral Q6H  . oxyCODONE  20 mg Oral Q12H  . polyethylene glycol  17 g Oral Daily  . predniSONE  10 mg Oral Q breakfast  . rivaroxaban  20 mg Oral Q supper  . umeclidinium-vilanterol  1 puff Inhalation Daily    Continuous Infusions:   PRN Meds: acetaminophen **OR** acetaminophen, albuterol, alum & mag hydroxide-simeth, LORazepam, morphine injection, oxyCODONE, promethazine, sodium chloride flush, temazepam  Physical Exam  Constitutional: She appears well-developed. She appears ill.  HENT:  Mouth/Throat: Oropharynx is clear and moist.  Cardiovascular: Normal rate, regular rhythm and normal heart sounds.   Pulmonary/Chest: Effort normal and breath sounds normal.  Skin: Skin is warm and dry.              Vital Signs: BP 115/74 (BP Location: Right Arm)   Pulse 66   Temp 97.4 F (36.3 C) (Oral)   Resp 18   Ht '6\' 2"'$  (1.88 m)   Wt 107.5 kg (237 lb)   SpO2 98%   BMI 30.43 kg/m  SpO2: SpO2: 98 % O2 Device: O2 Device: Not Delivered O2 Flow Rate: O2 Flow Rate (L/min): 2 L/min  Intake/output summary:   Intake/Output Summary (Last 24 hours) at 03/21/16 0741 Last data filed at 03/20/16 1845  Gross per 24 hour  Intake  960 ml  Output              100 ml  Net              860 ml   LBM: Last BM Date: 03/16/16 Baseline Weight: Weight: 107.5 kg (237 lb) Most recent weight: Weight: 107.5 kg (237 lb)       Palliative Assessment/Data: 70%    Flowsheet Rows   Flowsheet Row Most Recent Value  Intake Tab  Referral Department  Hospitalist  Unit at Time of Referral  Med/Surg Unit  Palliative Care Primary Diagnosis  Cancer  Date Notified  03/16/16  Palliative Care Type  New Palliative care  Reason for referral  Clarify Goals of Care  Date of Admission  03/16/16  Date first seen by Palliative Care  03/17/16  # of days Palliative referral response time  1 Day(s)  # of days IP prior to Palliative referral  0  Clinical Assessment  Psychosocial & Spiritual Assessment  Palliative Care Outcomes      Patient Active Problem List   Diagnosis Date Noted  . Neck pain   . Palliative care encounter   . Uncontrolled pain 03/16/2016  . Cancer related pain   . Lung cancer, primary, with metastasis from lung to other site, left ([place])   . Cancer associated pain 02/06/2016  . Atrial fibrillation, transient ([place]) 11/30/2015  . Goals of care, counseling/discussion 10/19/2015  . Atypical chest pain 10/05/2015  . Dizziness 10/05/2015  . Pulmonary embolism ([place]) 09/15/2015  . Pulmonary embolus ([place]) 09/15/2015  . Radiation pneumonitis ([place]) 08/18/2015  . DNR (do not resuscitate) 08/18/2015  . Acute bronchitis due to infection 07/27/2015  . Leukocytosis 07/07/2015  . Headache  disorder 06/08/2015  . UTI (lower urinary tract infection) 05/26/2015  . Drug-induced hypokalemia 05/26/2015  . Dehydration 05/26/2015  . Chemotherapy-induced nausea 05/26/2015  . Palliative care by specialist 05/18/2015  . Thrombocytopenia ([place]) 02/24/2015  . Other specified hypothyroidism 02/03/2015  . Numbness and tingling of right arm 01/12/2015  . Rash 11/26/2014  . COPD (chronic obstructive pulmonary disease) ([place]) 11/25/2014  . Pancytopenia due to antineoplastic chemotherapy ([place]) 11/10/2014  . Neuropathy due to chemotherapeutic drug ([place]) 11/10/2014  . Dysuria 11/03/2014  . Cough 10/06/2014  . 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 Protein calorie malnutrition ([place]) 08/26/2014  . Elevated serum creatinine 08/26/2014  . Right shoulder pain 06/17/2014  . 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 Arthritis 04/27/2014  . Bilateral leg edema 04/27/2014  . Sinusitis, chronic 04/27/2014  . Essential hypertension 04/27/2014  . Other constipation 04/27/2014  . Cellulitis 04/20/2014  . Anemia due to antineoplastic chemotherapy 03/16/2014  . Cyst of skin 02/26/2014  . Other fatigue 02/26/2014  . Leukopenia due to antineoplastic chemotherapy ([place]) 01/28/2014  . Radiation-induced dermatitis 01/28/2014  . Esophagitis 01/07/2014  . Hypersensitivity reaction 01/04/2014  . Insomnia 12/29/2013  . Constipation due to opioid therapy 12/17/2013  . Adenocarcinoma of upper lobe of right lung   . Nausea with vomiting 12/16/2013  . Elevated blood pressure 12/15/2013  . Generalized anxiety disorder 12/14/2013  . Acquired hypothyroidism 12/14/2013  . Overweight (BMI 25.0-29.9) 12/14/2013  . Epidural mass 12/13/2013  . 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 Pathologic compression fracture of vertebra [facility])     Palliative Care Assessment & Plan   Patient Profile: 61 y.o. 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 Palliative consulted for symptom management and goals of care discussion.  Recommendations/Plan:   Pain: Continue with current medication, patient tells me this morning that her medications "are holding the pain"  Nausea: Zofran OTC  Home hospice services  Continued support helping patient navigate her treatment options and discharge plan in context of terminal illness   Code Status:    Code Status Orders        Start     Ordered   03/16/16 1546  Do not attempt resuscitation (DNR)  Continuous    Question Answer Comment  In the event of cardiac or respiratory ARREST Do not call a "code blue"   In the event of cardiac or respiratory ARREST Do not perform Intubation, CPR, defibrillation or ACLS   In the event of cardiac or respiratory ARREST Use medication by any route, position, wound care, and other measures to relive pain and suffering. May use oxygen, suction and manual treatment of airway obstruction as needed for comfort.      03/16/16 1545    Code Status History    Date Active Date Inactive Code Status Order ID Comments User Context   09/15/2015 10:51 PM 09/17/2015  3:37 PM Full Code [id number]  Lily Kocher, MD Inpatient   03/29/2014  5:14 PM 03/30/2014  3:49 AM Full Code [id number]  Markus Daft, MD HOV   12/14/2013  4:23 PM 12/17/2013 11:52 PM Full Code [id number]  Carylon Perches, MD Inpatient   12/14/2013 12:08 AM 12/14/2013  4:23 PM Full Code [id number]  Theressa Millard, MD Inpatient    Advance Directive Documentation   Flowsheet Row Most Recent Value  Type of Advance Directive  Healthcare Power of Attorney, Living will  Pre-existing out of facility DNR order (yellow form or pink MOST form)  No data  "MOST" Form in Place?  No data       Prognosis:   less than 6 months  Discharge Planning:  *   Home with hospice, discharge today,  hospice referral placed    Care plan was discussed with  Dr Lisbeth Renshaw and Dr Wynetta Emery  Thank you for allowing the Palliative Medicine Team to assist in the care of this patient.   Time in 0710    Time out 0740    Total time 35 minutes  PMT will f/u in the morning     Greater than 50%  of this time was spent counseling and coordinating care related to the above assessment and plan.  Wadie Lessen, NP  Please contact Palliative Medicine Team phone at [phone] for questions and concerns.

## 2016-03-21 NOTE — Discharge Summary (Signed)
Physician Discharge Summary  Jasmine Grant DVV:[id number] DOB: May 23, 1955 DOA: 03/16/2016  PCP: Reginia Naas, MD  Admit date: 03/16/2016 Discharge date: 03/21/2016  Time spent: 25* minutes  Recommendations for Outpatient Follow-up:  1. Follow up Oncology as outpatient 2. Continue Zithromax 250 mg po daily x 4 days, start from tomorrow   Discharge Diagnoses:  Active Problems:   Pathologic compression fracture of vertebra [facility])   Other specified hypothyroidism   Pulmonary embolism (HCC)   Lung cancer, primary, with metastasis from lung to other site, left [facility])   Uncontrolled pain   Cancer related pain   Palliative care encounter   Neck pain   Discharge Condition: Stable  Diet recommendation: Regular diet  Filed Weights   03/16/16 1059  Weight: 107.5 kg (237 lb)    History of present illness:  61 y.o.femalewith medical history of pulmonary embolus, metastatic adenocarcinoma of the lung, and anxiety presented with one-week history of worsening neck and upper back pain. The patient is followed by Dr. Alvy Bimler. The patient had a recent CT of her chest on 01/03/2016 which showed interval progression of bilateral pulmonary nodules suggesting metastatic progression.  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 Will start these medications as outpatient - patient to start palliative radiation next week.   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 Alvy Bimler and spoke with her about patient  She will follow her up as outpatient   Pulmonary embolus history -Diagnosed Aug2017 -Continue rivaroxaban -Clinically stable at present  Sinusitis Influenza PCR is negative Will start Zithromax 500 mg  po x 1,  then 250 mg po daily from tomorrow for four more days.  COPD -Stable presently on room air without exacerbation -continue Anoro -albuterol prn dyspnea  Loose Stool -This was caused by the patient taking milk of magnesia on 3 consecutive days   Procedures:  None   Consultations:  Oncology  Discharge Exam: Vitals:   03/20/16 2129 03/21/16 0450  BP: 122/69 115/74  Pulse: 69 66  Resp: 18 18  Temp: 98.3 F (36.8 C) 97.4 F (36.3 C)    General: Appears in no acute distress Cardiovascular: RRR, S1S2 Respiratory: Clear bilaterally  Discharge Instructions   Discharge Instructions    Diet - low sodium heart healthy    Complete by:  As directed    Increase activity slowly    Complete by:  As directed      Current Discharge Medication List    START taking these medications   Details  azithromycin (ZITHROMAX) 250 MG tablet Start taking 1 tab po daily from 03/22/16 Qty: 4 each, Refills: 0    ondansetron (ZOFRAN-ODT) 4 MG disintegrating tablet Take 1 tablet (4 mg total) by mouth every 6 (six) hours. Qty: 20 tablet, Refills: 0    oxyCODONE (OXY IR/ROXICODONE) 5 MG immediate release tablet Take 1-2 tablets (5-10 mg total) by mouth every 3 (three) hours as needed for moderate pain or severe pain. Qty: 30 tablet, Refills: 0      CONTINUE these medications which have CHANGED   Details  oxyCODONE (OXYCONTIN) 20 mg 12 hr tablet Take 1 tablet (20 mg total) by mouth every 12 (twelve) hours. Qty: 14 tablet, Refills: 0      CONTINUE these medications which have NOT CHANGED  Details  albuterol (PROVENTIL) (2.5 MG/3ML) 0.083% nebulizer solution Take 3 mLs (2.5 mg total) by nebulization every 4 (four) hours as needed for wheezing or shortness of breath. Qty: 300 mL, Refills: 2    albuterol (VENTOLIN HFA) 108 (90 Base) MCG/ACT inhaler Inhale 2 puffs into the lungs every 4 (four) hours as needed for wheezing or shortness of breath. Qty: 18 Inhaler, Refills: 0     guaiFENesin (MUCINEX) 600 MG 12 hr tablet Take 600 mg by mouth 2 (two) times daily.    levothyroxine (SYNTHROID, LEVOTHROID) 200 MCG tablet Take 200 mcg by mouth daily before breakfast.    lidocaine (LIDODERM) 5 % Place 1 patch onto the skin daily. Remove & Discard patch within 12 hours or as directed by MD Qty: 30 patch, Refills: 1    LORazepam (ATIVAN) 0.5 MG tablet Take 0.5 mg by mouth every 8 (eight) hours as needed for anxiety.    predniSONE (DELTASONE) 10 MG tablet Take 10 mg by mouth daily with breakfast.    ranitidine (ZANTAC) 150 MG tablet Take 150 mg by mouth at bedtime.    rivaroxaban (XARELTO) 20 MG TABS tablet Take 1 tablet (20 mg total) by mouth daily with supper. Qty: 30 tablet, Refills: 9    temazepam (RESTORIL) 15 MG capsule Take 1 capsule (15 mg total) by mouth at bedtime as needed for sleep. Qty: 30 capsule, Refills: 1    umeclidinium-vilanterol (ANORO ELLIPTA) 62.5-25 MCG/INH AEPB Inhale 1 puff into the lungs daily.      STOP taking these medications     HYDROmorphone (DILAUDID) 2 MG tablet      ondansetron (ZOFRAN) 8 MG tablet        Allergies  Allergen Reactions  . Codeine Nausea And Vomiting  . Penicillins Other (See Comments)    Reaction:  Unknown  Has patient had a PCN reaction causing immediate rash, facial/tongue/throat swelling, SOB or lightheadedness with hypotension: Unsure  Has patient had a PCN reaction causing severe rash involving mucus membranes or skin necrosis: Unsure  Has patient had a PCN reaction that required hospitalization Unsure  Has patient had a PCN reaction occurring within the last 10 years: No If all of the above answers are "NO", then may proceed with Cephalosporin use.  . Sulfa Antibiotics Nausea And Vomiting      The results of significant diagnostics from this hospitalization (including imaging, microbiology, ancillary and laboratory) are listed below for reference.    Significant Diagnostic Studies: Mr Cervical  Spine W Wo Contrast  Result Date: 03/20/2016 CLINICAL DATA:  61 year old female with metastatic lung cancer. Cervical and thoracic spine metastatic disease. Progressive neck and upper back pain. Pretreatment planning. Subsequent encounter. EXAM: MRI TOTAL SPINE WITHOUT AND WITH CONTRAST TECHNIQUE: Multisequence MR imaging of the cervical and thoracic spine was performed prior to and following IV contrast administration for evaluation of spinal metastatic disease. CONTRAST:  91m MULTIHANCE GADOBENATE DIMEGLUMINE 529 MG/ML IV SOLN COMPARISON:  Thoracic Spine MRI 02/29/2016. FINDINGS: MRI CERVICAL SPINE FINDINGS Alignment: Straightening of cervical lordosis. Mild anterolisthesis of C3 on C4 and C4 on C5. Vertebrae: No occipital condyles or C1 metastatic disease. Possible patchy metastasis in the C2 spinous process. Confluent vertebral body and left posterior element metastatic disease throughout the C3 and C4 levels. Patchy metastatic disease in the mid C7 vertebral body. Cord: Abnormal cervical spine dural thickening and enhancement throughout (series 16, image 8). This continues into the dorsal upper thoracic spine. There is superimposed ventral and left lateral epidural tumor from  C2-C3 to C6-C7. 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 Suspicion of patchy abnormal spinal cord signal on sagittal STIR imaging throughout the same levels (series 13 image 8). Posterior Fossa, vertebral arteries, paraspinal tissues: Questionable early leptomeningeal metastatic disease about the lower brainstem (series 16, image 8). No cerebellar leptomeningeal disease. No signal abnormality in the brainstem or visible cerebellum. Abnormal prevertebral/retropharyngeal space fluid throughout the cervical spine and continuing into the upper thoracic spine. Disc levels: Chronic cervical disc  and endplate degeneration underlying the metastatic disease. In addition to so the cervical spinal stenosis and cord compression detailed above there is confluent epidural tumor obliterating the left C3, left C4, and left C5 neural foramina (series 8, images 17 through 27). MRI THORACIC SPINE FINDINGS Alignment: Stable thoracic vertebral height and alignment since January. Vertebrae: Confluent T1 vertebral body, right costovertebral, and left pedicle metastasis with ventral epidural and early bilateral foraminal epidural tumor. Mild enlargement of ventral epidural tumor compared to 02/29/2016. Superimposed dorsal dural thickening and enhancement tracking inferiorly from the cervical spine. Tumor invasion of the lateral right first rib (series 10, image 26). No other thoracic vertebral metastatic disease. Cord: Effaced CSF from the thecal sac at the T1 level related to the ventral epidural tumor and dorsal dural thickening (series 9, image 19), but no cord compression at this time. Thoracic spinal cord signal elsewhere is normal. No abnormal thoracic intradural enhancement in the visualized upper thoracic levels (no postcontrast images of the lower thoracic spine today. Paraspinal and other soft tissues: Large 7-8 cm area of Pancoast type right apical lung tumor, inseparable from the right margin of the esophagus (series 10, image 34), the right T1 vertebra, and the right subclavian artery. Abnormal circumferential esophageal wall thickening in the upper thorax (series 5, image 11). Disc levels: Stable since 02/29/2016. IMPRESSION: 1. Confluent C3 and C4 body and left vertebral metastatic disease with epidural and left foraminal tumor. Superimposed abnormal generalized circumferential cervical dural thickening and enhancement. 2. 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 See series 16, image 8. 3. Associated tumor  obliteration of the left C3 through C5 neural foramina. 4. Confluent T1 vertebral, left pedicle, and right costovertebral junction tumor with epidural involvement but no bona fide cord compression at this level. Partial involvement of both T1 neural foramen with tumor. 5. Large Pancoast type right apical lung mass with involvement of the esophagus and right proximal subclavian artery. 6. Questionable early leptomeningeal metastatic disease at the lower brainstem, but could be artifact as there is no spinal cord leptomeningeal disease identified. Repeat brain MRI without and with contrast would evaluate further. Electronically Signed   By: Genevie Ann M.D.   On: 03/20/2016 08:46   Mr Thoracic Spine W Wo Contrast  Result Date: 03/20/2016 CLINICAL DATA:  61 year old female with metastatic lung cancer. Cervical and thoracic spine metastatic disease. Progressive neck and upper back pain. Pretreatment planning. Subsequent encounter. EXAM: MRI TOTAL SPINE WITHOUT AND WITH CONTRAST TECHNIQUE: Multisequence MR imaging of the cervical and thoracic spine was performed prior to and following IV contrast administration for evaluation of spinal metastatic disease. CONTRAST:  61m MULTIHANCE GADOBENATE DIMEGLUMINE 529 MG/ML IV SOLN COMPARISON:  Thoracic Spine MRI 02/29/2016. FINDINGS: MRI CERVICAL SPINE FINDINGS Alignment: Straightening of cervical lordosis. Mild anterolisthesis of C3  on C4 and C4 on C5. Vertebrae: No occipital condyles or C1 metastatic disease. Possible patchy metastasis in the C2 spinous process. Confluent vertebral body and left posterior element metastatic disease throughout the C3 and C4 levels. Patchy metastatic disease in the mid C7 vertebral body. Cord: Abnormal cervical spine dural thickening and enhancement throughout (series 16, image 8). This continues into the dorsal upper thoracic spine. There is superimposed ventral and left lateral epidural tumor from C2-C3 to C6-C7. Subsequent malignant cervical  spinal stenosis from the C3 to the C7 level with a mild level of cord compression, superimposed on some degenerative spinal stenosis at both C5-C6 and C6-C7. Suspicion of patchy abnormal spinal cord signal on sagittal STIR imaging throughout the same levels (series 13 image 8). Posterior Fossa, vertebral arteries, paraspinal tissues: Questionable early leptomeningeal metastatic disease about the lower brainstem (series 16, image 8). No cerebellar leptomeningeal disease. No signal abnormality in the brainstem or visible cerebellum. Abnormal prevertebral/retropharyngeal space fluid throughout the cervical spine and continuing into the upper thoracic spine. Disc levels: Chronic cervical disc and endplate degeneration underlying the metastatic disease. In addition to so the cervical spinal stenosis and cord compression detailed above there is confluent epidural tumor obliterating the left C3, left C4, and left C5 neural foramina (series 8, images 17 through 27). MRI THORACIC SPINE FINDINGS Alignment: Stable thoracic vertebral height and alignment since January. Vertebrae: Confluent T1 vertebral body, right costovertebral, and left pedicle metastasis with ventral epidural and early bilateral foraminal epidural tumor. Mild enlargement of ventral epidural tumor compared to 02/29/2016. Superimposed dorsal dural thickening and enhancement tracking inferiorly from the cervical spine. Tumor invasion of the lateral right first rib (series 10, image 26). No other thoracic vertebral metastatic disease. Cord: Effaced CSF from the thecal sac at the T1 level related to the ventral epidural tumor and dorsal dural thickening (series 9, image 19), but no cord compression at this time. Thoracic spinal cord signal elsewhere is normal. No abnormal thoracic intradural enhancement in the visualized upper thoracic levels (no postcontrast images of the lower thoracic spine today. Paraspinal and other soft tissues: Large 7-8 cm area of  Pancoast type right apical lung tumor, inseparable from the right margin of the esophagus (series 10, image 34), the right T1 vertebra, and the right subclavian artery. Abnormal circumferential esophageal wall thickening in the upper thorax (series 5, image 11). Disc levels: Stable since 02/29/2016. IMPRESSION: 1. Confluent C3 and C4 body and left vertebral metastatic disease with epidural and left foraminal tumor. Superimposed abnormal generalized circumferential cervical dural thickening and enhancement. 2. 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 See series 16, image 8. 3. Associated tumor obliteration of the left C3 through C5 neural foramina. 4. Confluent T1 vertebral, left pedicle, and right costovertebral junction tumor with epidural involvement but no bona fide cord compression at this level. Partial involvement of both T1 neural foramen with tumor. 5. Large Pancoast type right apical lung mass with involvement of the esophagus and right proximal subclavian artery. 6. Questionable early leptomeningeal metastatic disease at the lower brainstem, but could be artifact as there is no spinal cord leptomeningeal disease identified. Repeat brain MRI without and with contrast would evaluate further. Electronically Signed   By: Genevie Ann M.D.   On: 03/20/2016 08:46   Mr Thoracic Spine W Wo Contrast  Result Date: 02/29/2016 CLINICAL DATA:  Metastatic lung cancer.  Worsening back pain. EXAM:  MRI THORACIC SPINE WITHOUT AND WITH CONTRAST TECHNIQUE: Multiplanar and multiecho pulse sequences of the thoracic spine were obtained without and with intravenous contrast. CONTRAST:  63m MULTIHANCE GADOBENATE DIMEGLUMINE 529 MG/ML IV SOLN COMPARISON:  CT chest 01/03/2016 FINDINGS: MRI THORACIC SPINE FINDINGS Scout view in the cervical spine shows advanced metastatic disease affecting the C3 and C4 levels with extension into  the posterior elements. There is probably encroachment upon the spinal canal. This level was not studied in detail. Cervical spine exam suggested. I think there is patchy metastatic disease within the C6 and C7 vertebral bodies. Abnormal signal throughout the T1 vertebral body is consist with metastatic disease. Mild posterior bowing of the posterior margin of the vertebral body indents the thecal sac slightly but does not cause neural compression. No metastatic disease seen the low T2. T1 hyperintense marrow in the upper thoracic region is consistent with previous radiation. Chronic degenerative marrow changes anteriorly at T8-9. Collapse and/or tumor again evident in the right upper lung. No evidence of metastatic disease to the cord itself. Ordinary degenerative changes are present throughout the lumbar region. There shallow disc herniations at T6-7 and T7-8 but no visible neural compression. IMPRESSION: Metastatic disease affecting the T1 vertebral body. Early extraosseous disease encroaching upon the ventral aspect of the spinal canal. No cord compression at this time. Extensive metastatic disease affecting the C3 and C4 vertebral bodies with extension into the posterior elements on the left. Suspicion of at least some canal compromise. This level was not studied in detail. Complete imaging of the cervical spine would be suggested to understand that anatomy better. Suspicion of patchy metastatic disease affecting the C6 and C7 vertebral bodies. These results will be called to the ordering clinician or representative by the Radiologist Assistant, and communication documented in the PACS or zVision Dashboard. Electronically Signed   By: MNelson ChimesM.D.   On: 02/29/2016 15:03    Microbiology: No results found for this or any previous visit (from the past 240 hour(s)).   Labs: Basic Metabolic Panel:  Recent Labs Lab 03/16/16 1109 03/17/16 0421  NA 140 141  K 3.8 4.1  CL 107 106  CO2 24 29   GLUCOSE 154* 135*  BUN 11 12  CREATININE 0.96 0.92  CALCIUM 9.1 8.7*   Liver Function Tests: No results for input(s): AST, ALT, ALKPHOS, BILITOT, PROT, ALBUMIN in the last 168 hours. No results for input(s): LIPASE, AMYLASE in the last 168 hours. No results for input(s): AMMONIA in the last 168 hours. CBC:  Recent Labs Lab 03/16/16 1109  WBC 7.7  NEUTROABS 5.7  HGB 12.5  HCT 40.4  MCV 92.0  PLT 215   Cardiac Enzymes: No results for input(s): CKTOTAL, CKMB, CKMBINDEX, TROPONINI in the last 168 hours. BNP: BNP (last 3 results)  Recent Labs  09/15/15 1700 10/12/15 1304  BNP 195.4* 255.1*        Signed:  LEleonore ChiquitoS MD.  [facility] 03/21/2016, 12:52 PM

## 2016-03-26 ENCOUNTER — Telehealth: Payer: Self-pay | Admitting: *Deleted

## 2016-03-26 ENCOUNTER — Other Ambulatory Visit: Payer: Self-pay | Admitting: *Deleted

## 2016-03-26 MED ORDER — MORPHINE SULFATE ER 30 MG PO TBCR: 30.0000 mg | EXTENDED_RELEASE_TABLET | Freq: Two times a day (BID) | ORAL | 0 refills | Status: AC

## 2016-03-26 NOTE — Telephone Encounter (Signed)
OK Do I need to print a new script?

## 2016-03-26 NOTE — Telephone Encounter (Signed)
[facility] called requesting to change oxycontin to MS Contin 30 mg BID. Oxycontin not part their formulary

## 2016-03-26 NOTE — Telephone Encounter (Signed)
Prescription faxed to [facility]

## 2016-03-27 ENCOUNTER — Telehealth: Payer: Self-pay | Admitting: *Deleted

## 2016-03-27 DIAGNOSIS — F40240 Claustrophobia: Secondary | ICD-10-CM | POA: Diagnosis not present

## 2016-03-27 DIAGNOSIS — Z79899 Other long term (current) drug therapy: Secondary | ICD-10-CM | POA: Diagnosis not present

## 2016-03-27 DIAGNOSIS — Z87891 Personal history of nicotine dependence: Secondary | ICD-10-CM | POA: Diagnosis not present

## 2016-03-27 DIAGNOSIS — C3411 Malignant neoplasm of upper lobe, right bronchus or lung: Secondary | ICD-10-CM | POA: Diagnosis not present

## 2016-03-27 DIAGNOSIS — Z7901 Long term (current) use of anticoagulants: Secondary | ICD-10-CM | POA: Diagnosis not present

## 2016-03-27 DIAGNOSIS — Z510 Encounter for antineoplastic radiation therapy: Secondary | ICD-10-CM | POA: Diagnosis present

## 2016-03-27 DIAGNOSIS — F419 Anxiety disorder, unspecified: Secondary | ICD-10-CM | POA: Diagnosis not present

## 2016-03-27 MED FILL — Ephedrine Sulf-NaCl Soln Pref Syr 50 MG/10ML-0.9% (5 MG/ML): INTRAVENOUS | Qty: 10 | Status: AC

## 2016-03-27 MED FILL — Lactated Ringer's Solution: INTRAVENOUS | Qty: 1000 | Status: AC

## 2016-03-27 MED FILL — Fentanyl Citrate Preservative Free (PF) Inj 100 MCG/2ML: INTRAMUSCULAR | Qty: 2 | Status: AC

## 2016-03-27 MED FILL — Propofol IV Emul 200 MG/20ML (10 MG/ML): INTRAVENOUS | Qty: 20 | Status: AC

## 2016-03-27 MED FILL — Midazolam HCl Inj 2 MG/2ML (Base Equivalent): INTRAMUSCULAR | Qty: 2 | Status: AC

## 2016-03-27 MED FILL — Succinylcholine Chloride Inj 20 MG/ML: INTRAMUSCULAR | Qty: 6 | Status: AC

## 2016-03-27 MED FILL — Dexamethasone Sodium Phosphate Inj 10 MG/ML: INTRAMUSCULAR | Qty: 1 | Status: AC

## 2016-03-27 MED FILL — Ondansetron HCl Inj 4 MG/2ML (2 MG/ML): INTRAMUSCULAR | Qty: 2 | Status: AC

## 2016-03-27 MED FILL — Lidocaine HCl Local Soln Prefilled Syringe 100 MG/5ML (2%): INTRAMUSCULAR | Qty: 5 | Status: AC

## 2016-03-27 MED FILL — Phenylephrine-NaCl Pref Syr 0.4 MG/10ML-0.9% (40 MCG/ML): INTRAVENOUS | Qty: 10 | Status: AC

## 2016-03-27 NOTE — Telephone Encounter (Signed)
Thanks, Jasmine Grant hasn't called me but I'm sure that what you have is right if it's been documented! Thanks, Jasmine Grant

## 2016-03-27 NOTE — Telephone Encounter (Signed)
Jasmine Grant from hospice of [facility], patient has been throwing uop past 2 days, and is unable to make her [place] treatment appts this week, ,"She has been approved by hospice  To get up to 10 treatment" we will call back next Monday to schedule appts, informed Jasmine Grant to talk with Mont Dutton, Brain navigator Will inform MD and Mont Dutton, 2:02 PM  2:01 PM

## 2016-03-28 ENCOUNTER — Ambulatory Visit: Payer: BLUE CROSS/BLUE SHIELD | Admitting: Radiation Oncology

## 2016-03-30 ENCOUNTER — Ambulatory Visit: Payer: BLUE CROSS/BLUE SHIELD | Admitting: Radiation Oncology

## 2016-04-02 ENCOUNTER — Ambulatory Visit: Payer: BLUE CROSS/BLUE SHIELD | Admitting: Radiation Oncology

## 2016-04-04 ENCOUNTER — Ambulatory Visit
Admission: RE | Admit: 2016-04-04 | Discharge: 2016-04-04 | Disposition: A | Discharge status: Home / Self Care | Payer: BLUE CROSS/BLUE SHIELD | Source: Ambulatory Visit | Attending: Radiation Oncology | Admitting: Radiation Oncology

## 2016-04-04 ENCOUNTER — Other Ambulatory Visit: Payer: Self-pay | Admitting: *Deleted

## 2016-04-04 NOTE — Progress Notes (Signed)
Called [facility]1.,spoke with [place] therapist, patient is here but Dr. Vertell Limber isns't as yet,  4:04 PM

## 2016-04-06 ENCOUNTER — Ambulatory Visit: Payer: BLUE CROSS/BLUE SHIELD | Admitting: Radiation Oncology

## 2016-04-09 ENCOUNTER — Other Ambulatory Visit: Payer: BLUE CROSS/BLUE SHIELD

## 2016-04-09 ENCOUNTER — Ambulatory Visit: Payer: BLUE CROSS/BLUE SHIELD | Admitting: Radiation Oncology

## 2016-04-09 ENCOUNTER — Ambulatory Visit: Payer: BLUE CROSS/BLUE SHIELD | Admitting: Hematology and Oncology

## 2016-04-10 ENCOUNTER — Encounter (HOSPITAL_COMMUNITY): Admission: RE | Payer: Self-pay | Source: Ambulatory Visit

## 2016-04-10 ENCOUNTER — Ambulatory Visit (HOSPITAL_COMMUNITY): Admission: RE | Admit: 2016-04-10 | Payer: BLUE CROSS/BLUE SHIELD | Source: Ambulatory Visit

## 2016-04-10 ENCOUNTER — Other Ambulatory Visit (HOSPITAL_COMMUNITY): Payer: BLUE CROSS/BLUE SHIELD

## 2016-04-10 ENCOUNTER — Ambulatory Visit (HOSPITAL_COMMUNITY): Payer: BLUE CROSS/BLUE SHIELD

## 2016-04-10 SURGERY — RADIOLOGY WITH ANESTHESIA
Anesthesia: General

## 2016-04-11 ENCOUNTER — Ambulatory Visit: Payer: BLUE CROSS/BLUE SHIELD | Admitting: Radiation Oncology

## 2016-04-13 ENCOUNTER — Ambulatory Visit: Payer: BLUE CROSS/BLUE SHIELD | Admitting: Radiation Oncology

## 2016-04-16 ENCOUNTER — Ambulatory Visit
Admission: RE | Admit: 2016-04-16 | Payer: BLUE CROSS/BLUE SHIELD | Source: Ambulatory Visit | Admitting: Radiation Oncology

## 2016-04-16 ENCOUNTER — Encounter: Payer: Self-pay | Admitting: Radiation Oncology

## 2016-04-23 NOTE — Progress Notes (Signed)
  Radiation Oncology         [phone] ________________________________  Name: Jasmine Grant MRN: [id number]  Date: 04/16/2016  DOB: [birth year]  Documentation Note  Diagnosis: Metastatic lung cancer with osseous metastasis  Narrative: The patient's clinical status unfortunately declined. Therefore, her planned course of radiosurgery to the cervical spine was cancelled. She is currently being managed by hospice.  ------------------------------------------------  Jodelle Gross, MD, PhD  This document serves as a record of services personally performed by Kyung Rudd, MD. It was created on his behalf by Darcus Austin, a trained medical scribe. The creation of this record is based on the scribe's personal observations and the provider's statements to them. This document has been checked and approved by the attending provider.

## 2016-05-07 ENCOUNTER — Ambulatory Visit: Payer: Self-pay | Admitting: Radiation Oncology

## 2016-05-21 DIAGNOSIS — C7951 Secondary malignant neoplasm of bone: Secondary | ICD-10-CM | POA: Insufficient documentation

## 2016-05-21 NOTE — Progress Notes (Signed)
  Radiation Oncology         [phone] ________________________________  Name: Jasmine Grant MRN: [id number]  Date: 03/20/2016  DOB: 1955/12/09  SIMULATION AND TREATMENT PLANNING NOTE  DIAGNOSIS:     ICD-9-CM ICD-10-CM   1. Lung cancer, primary, with metastasis from lung to other site, left ([place]) 162.9 C34.92   2. Adenocarcinoma of upper lobe of right lung 162.3 C34.11   3. 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  Identity was confirmed.  All relevant records and images related to the planned course of therapy were reviewed.   Written consent to proceed with treatment was confirmed which was freely given after reviewing the details related to the planned course of therapy had been reviewed with the patient.  Then, the patient was set-up in a stable reproducible supine position for radiation therapy.  CT images were obtained.  Surface markings were placed.    Medically necessary complex treatment device(s) for immobilization:  1.  customized body-fix device, 2.  customized accuform device.   The CT images were loaded into the planning software.  Then the target and avoidance structures were contoured.  Treatment planning then occurred.  The radiation prescription was entered and confirmed.   The patient will receive a course of stereotactic radiosurgery to the spine. I am therefore requesting a 3-D conformal technique. The target volume has been contoured in addition to the spinal cord and surrounding critical at risk organs.  Dose volume histograms of each of these structures will be carefully reviewed as part of the 3-D conformal technique.   PLAN:  The patient will receive 21 Gy in 3 fraction(s).  ________________________________   Jodelle Gross, MD, PhD

## 2016-05-29 DEATH — deceased

## 2016-06-29 ENCOUNTER — Telehealth: Payer: Self-pay | Admitting: *Deleted

## 2016-06-29 NOTE — Telephone Encounter (Signed)
No call

## 2016-07-03 ENCOUNTER — Encounter: Payer: Self-pay | Admitting: Pharmacy Technician

## 2016-07-03 NOTE — Progress Notes (Signed)
Treatment for the patient is complete and drug assistance is closed. Contact pharmacy if future assistance is needed.

## 2016-09-11 IMAGING — CR DG CHEST 2V
2 series · 2 of 2 positions shown · non-contrast
Comparison: 09/15/2015

CLINICAL DATA: Cough.  Generalized chest pain.  Lung cancer.  COPD.

EXAM:
CHEST  2 VIEW

[w chest pa]
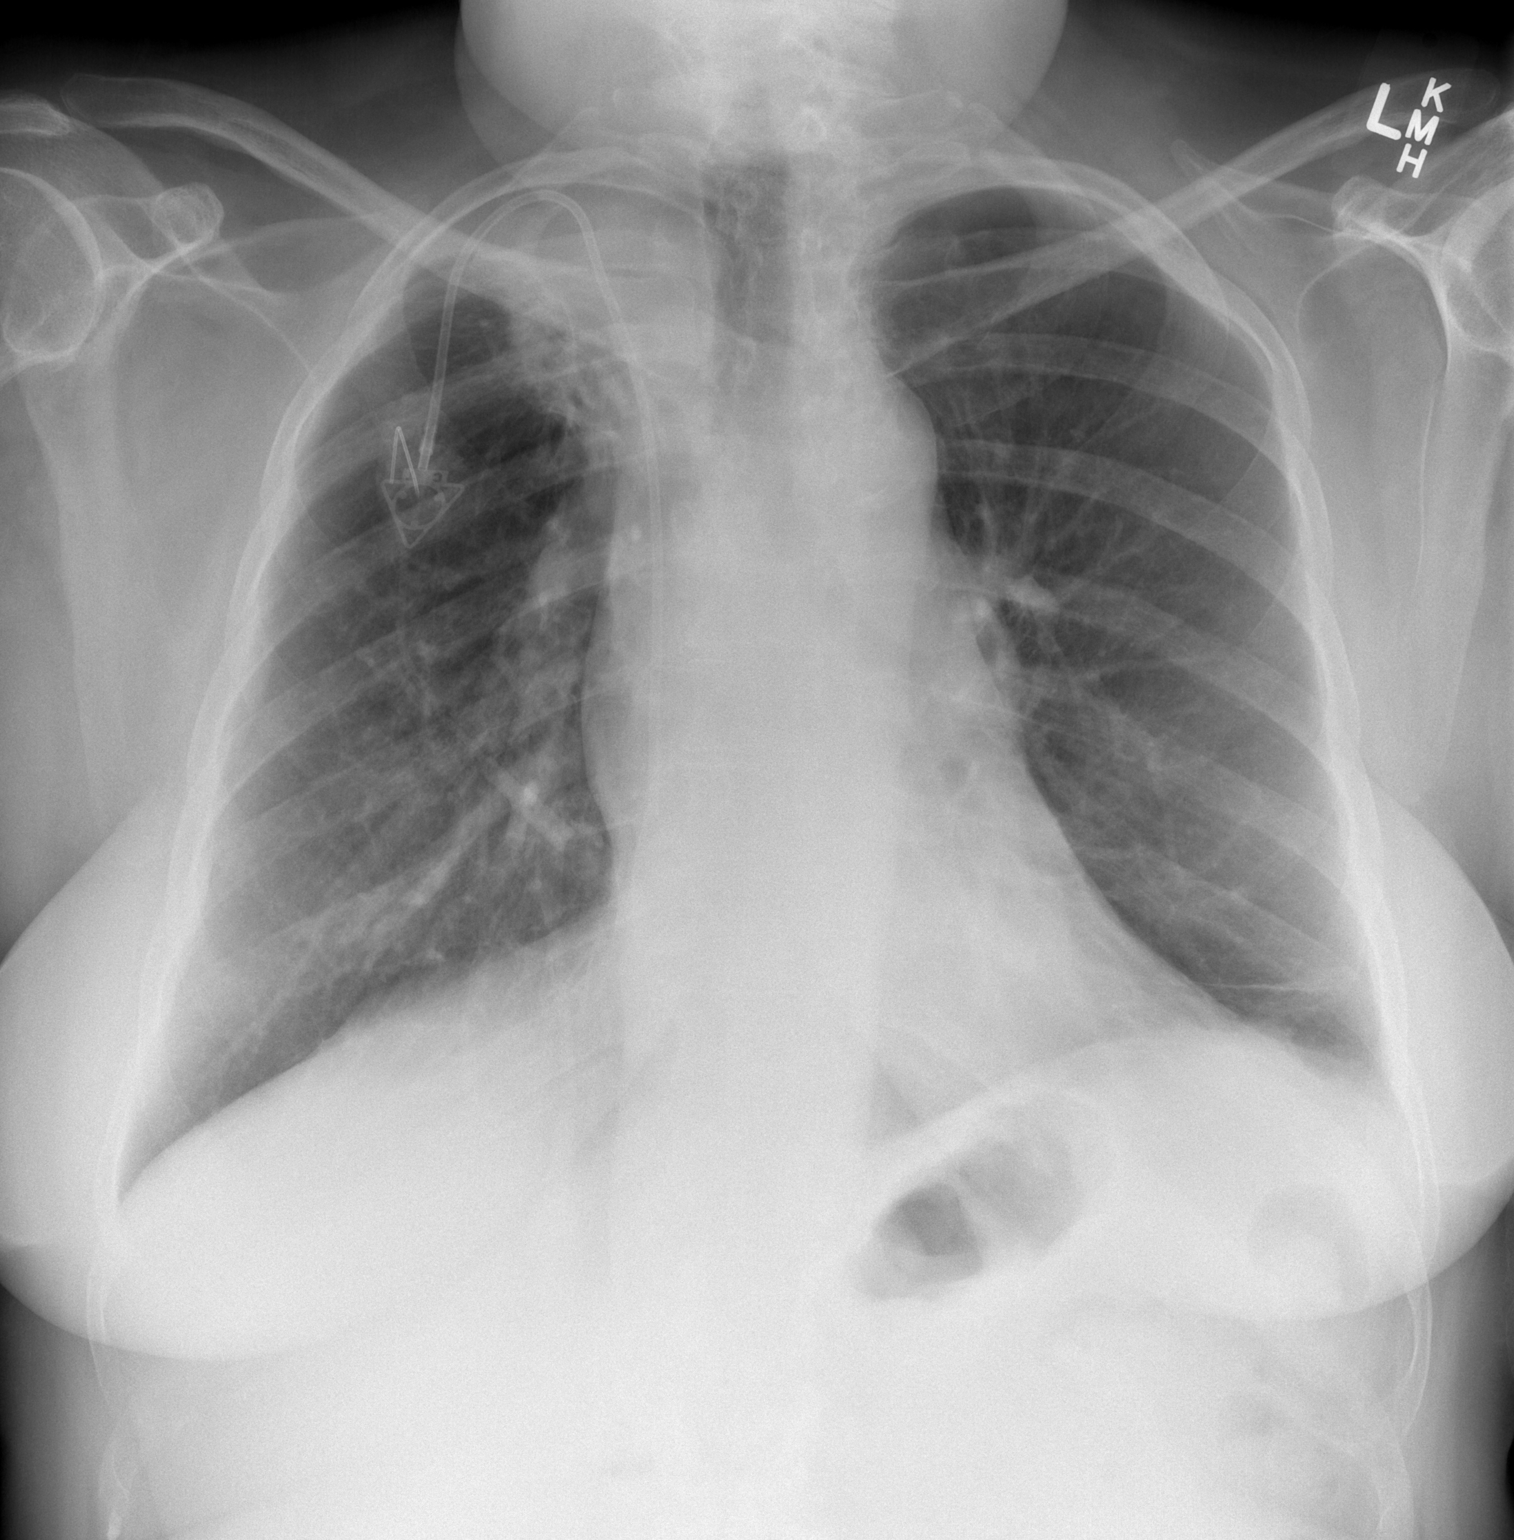

[w chest lat]
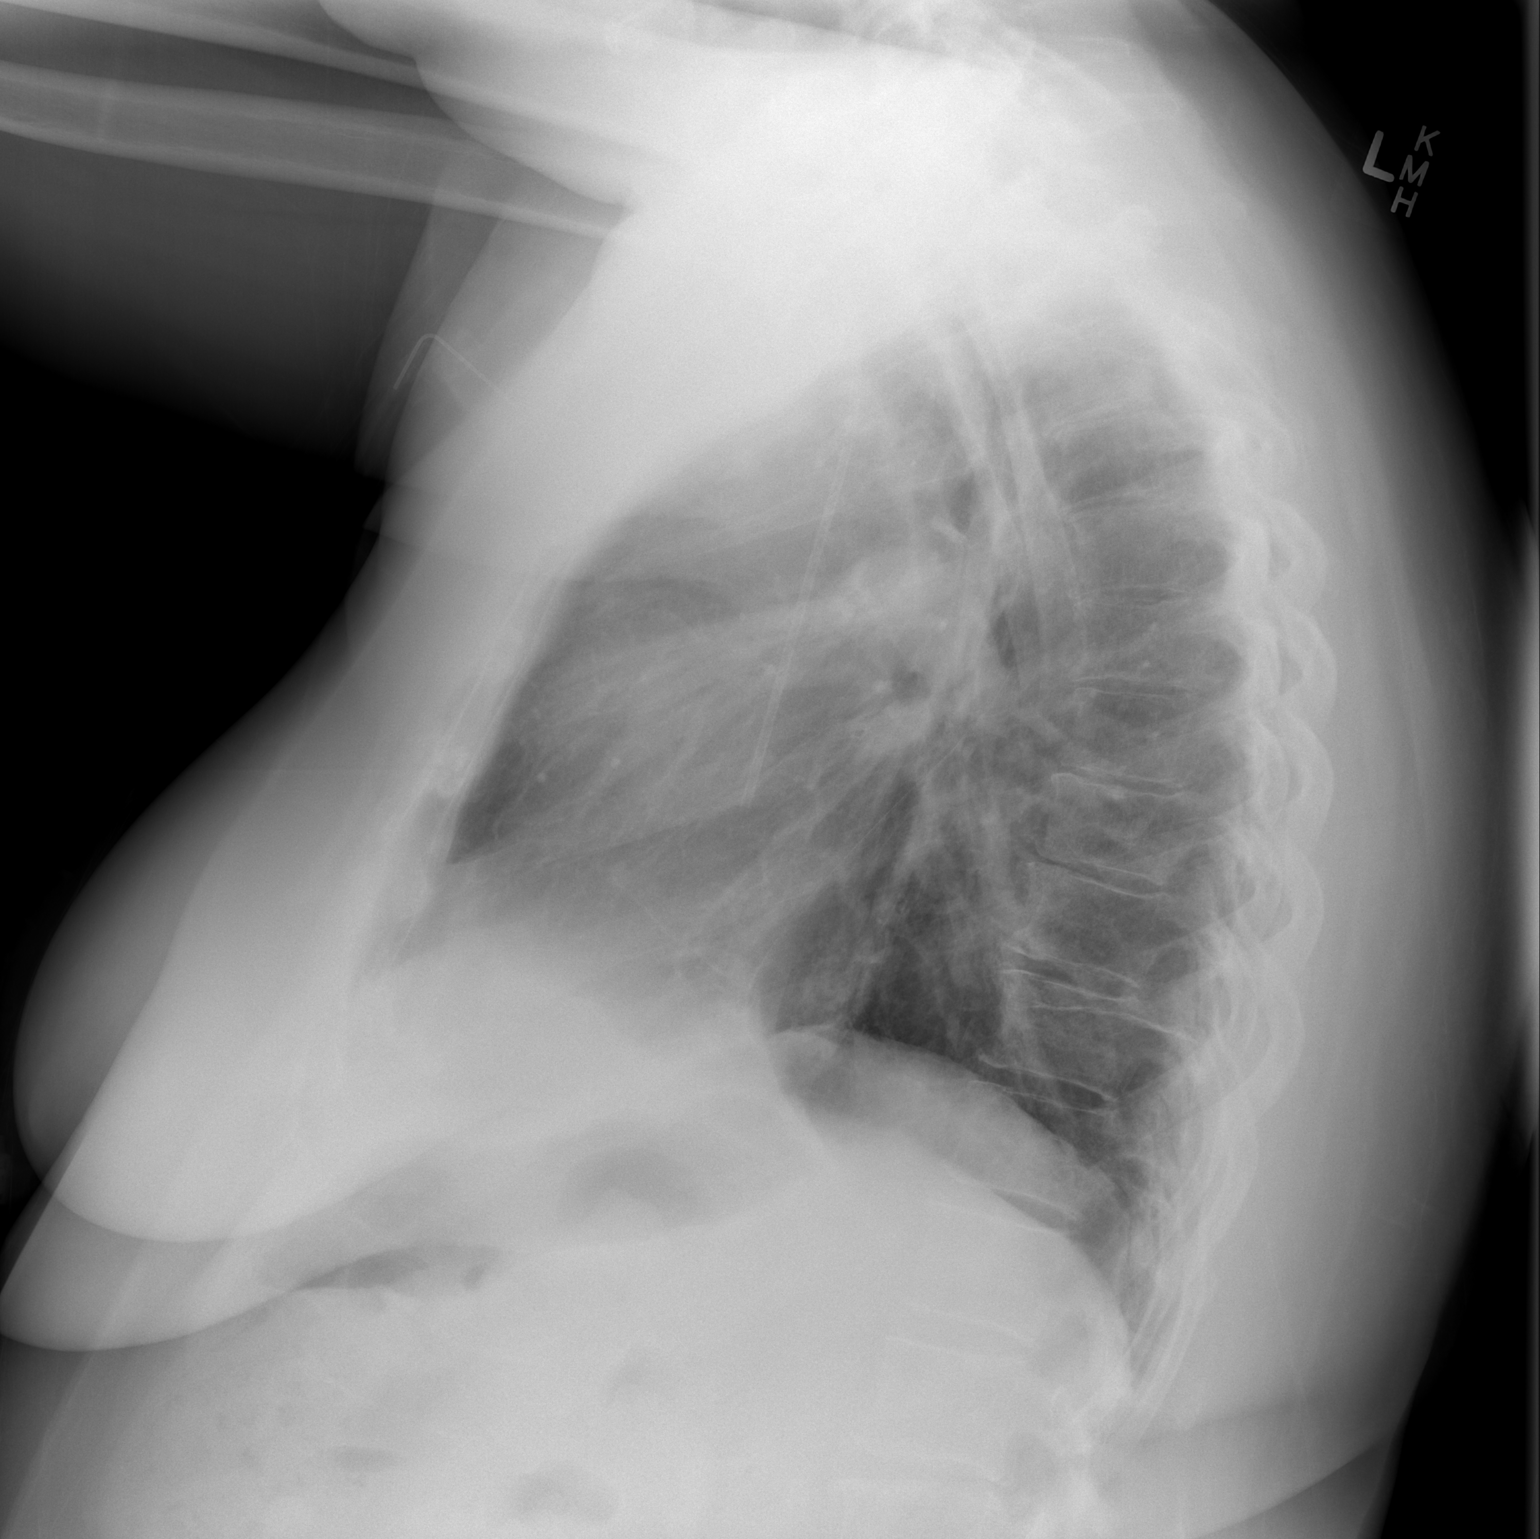

[2 of 2 positions shown; findings below may reference images not displayed]

FINDINGS: Lateral view degraded by patient arm position. A right-sided
Port-A-Cath terminates at the cavoatrial junction or high right
atrium.

Midline trachea. Normal heart size. Similar pleural-parenchymal
opacity at the medial right upper lobe and right apex. No pleural
effusion or pneumothorax. Left base scarring. No lobar
consolidation.
IMPRESSION: Grossly similar right apical lung mass.

No acute superimposed process.

## 2016-09-18 IMAGING — CT CT HEAD W/O CM
3 of 4 series · 14 of 47 positions shown, 16 images · non-contrast
Comparison: MRI 06/13/2015

CLINICAL DATA: Headache

EXAM:
CT HEAD WITHOUT CONTRAST
TECHNIQUE: Contiguous axial images were obtained from the base of the skull
through the vertex without intravenous contrast.

[Series 2: head w/o · axial · non-contrast · 0.45mm/px · z∈[+1599,+1724]mm · 8 of 31 slices shown, 10 images]
[im 3/31  brain]
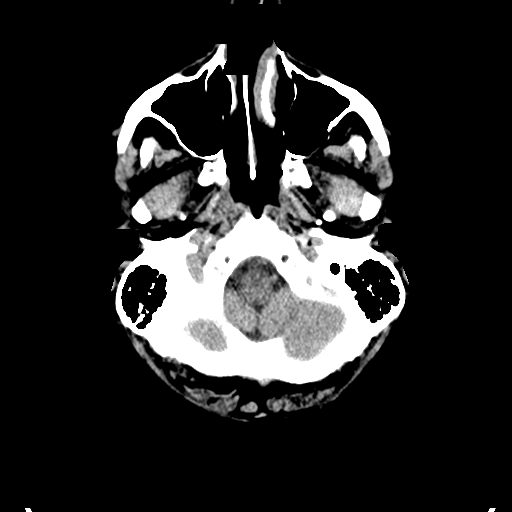
[im 3/31  bone]
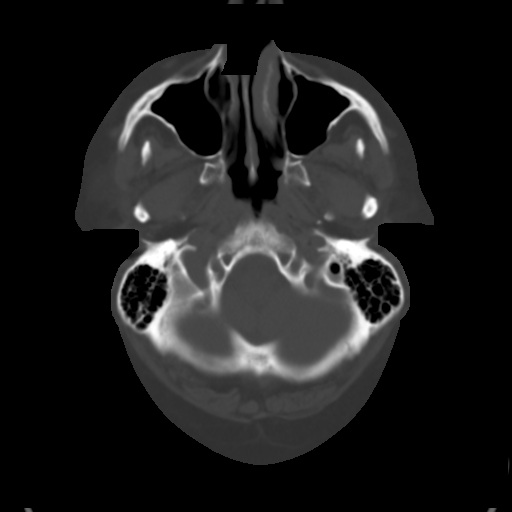
[im 7/31  brain]
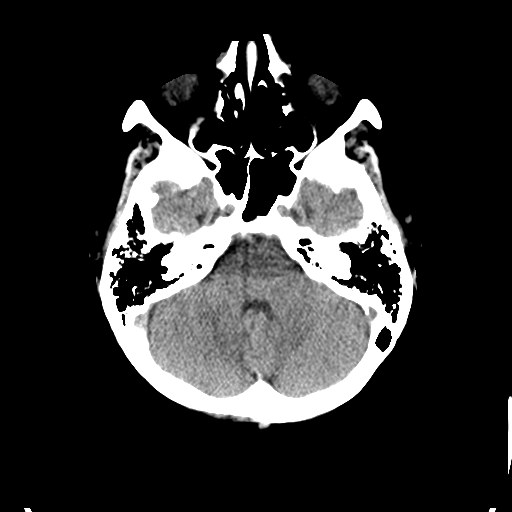
[im 11/31  brain]
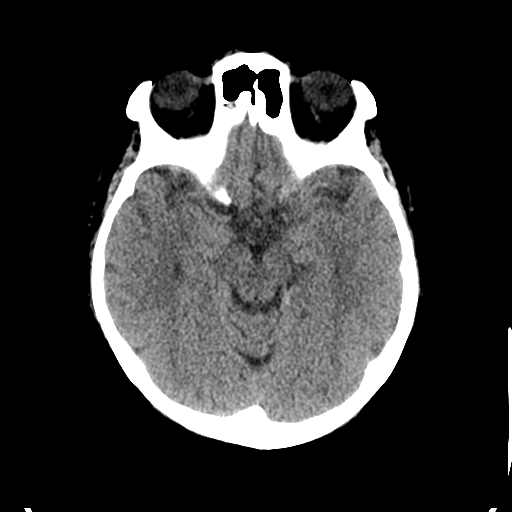
[im 13/31  brain]
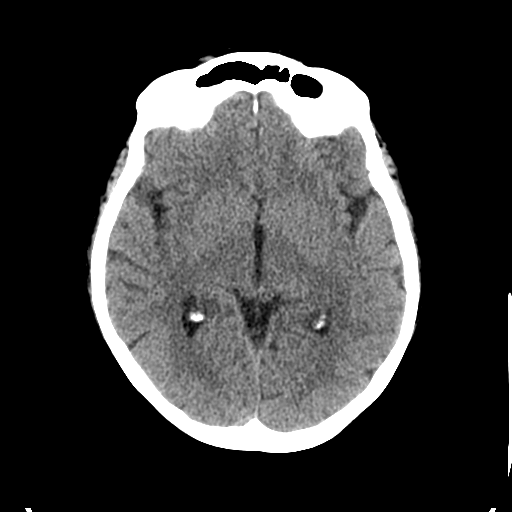
[im 18/31  brain]
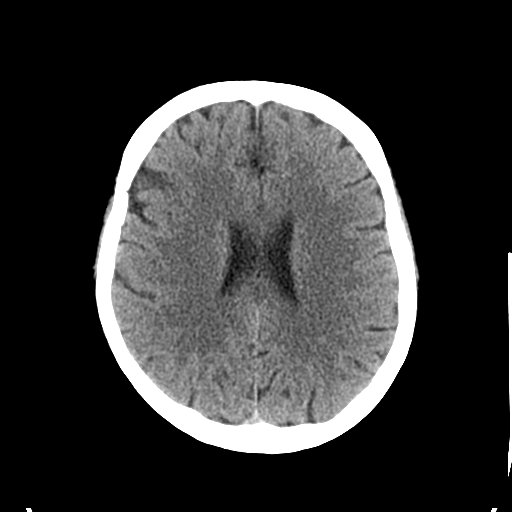
[im 18/31  bone]
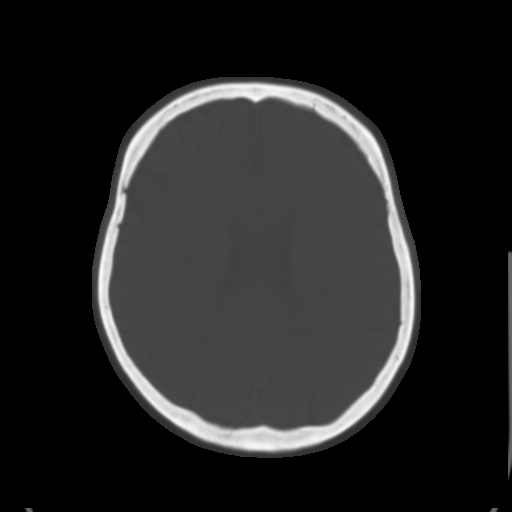
[im 20/31  brain]
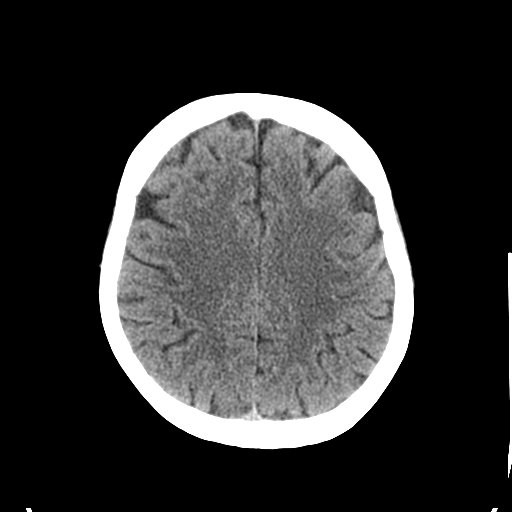
[im 24/31  brain]
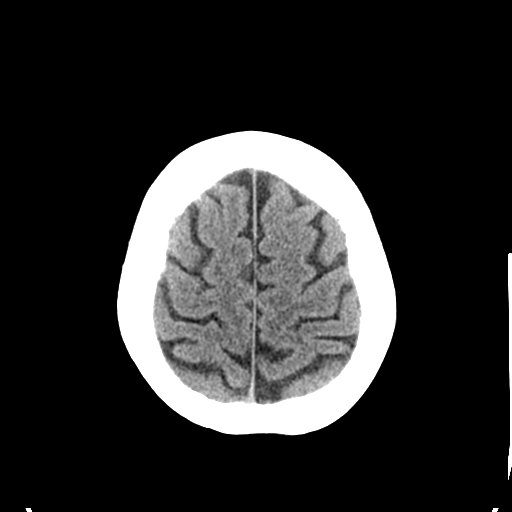
[im 28/31  brain]
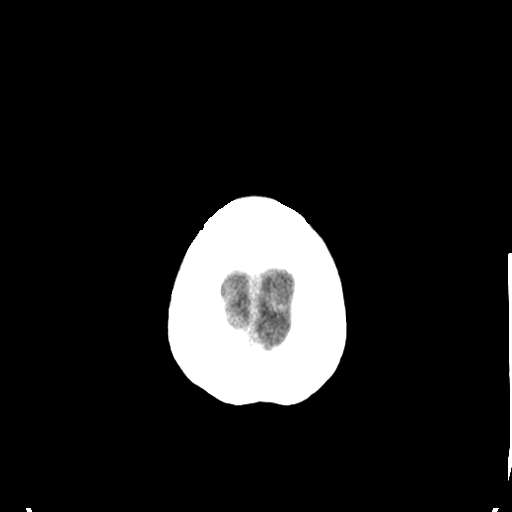

[Series 4: coronal · coronal · 0.31mm/px · 3 of 65 slices shown]
[im 22/65  brain]
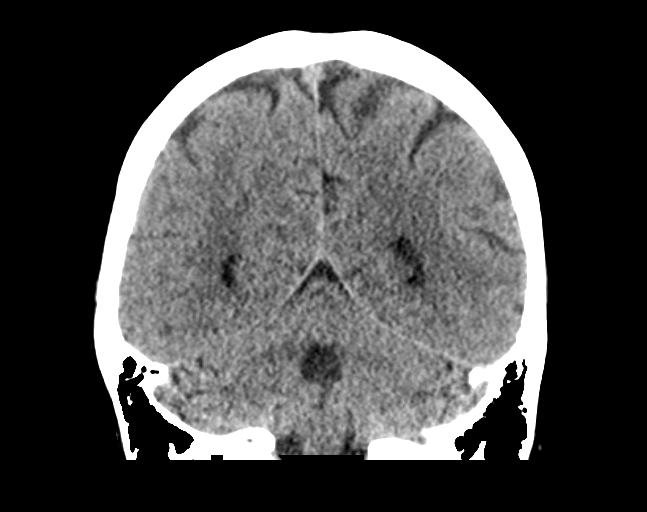
[im 29/65  brain]
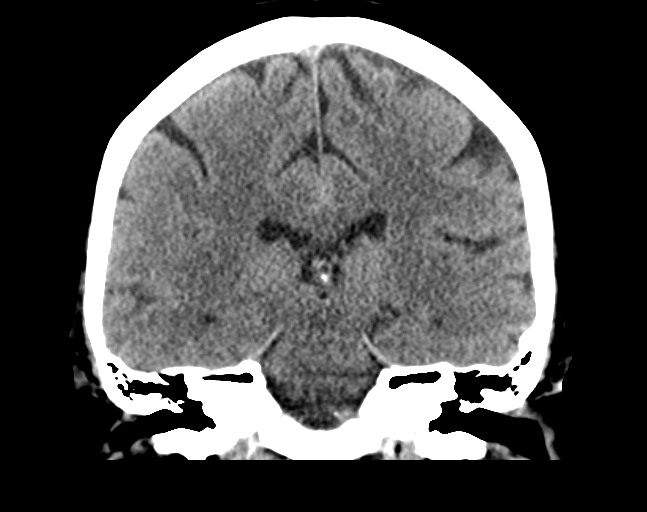
[im 36/65  brain]
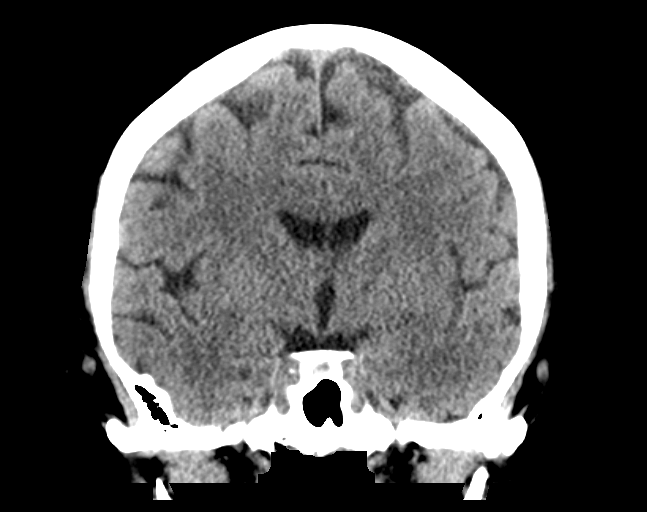

[Series 5: sagittal · sagittal · 0.30mm/px · 3 of 56 slices shown]
[im 19/56  brain]
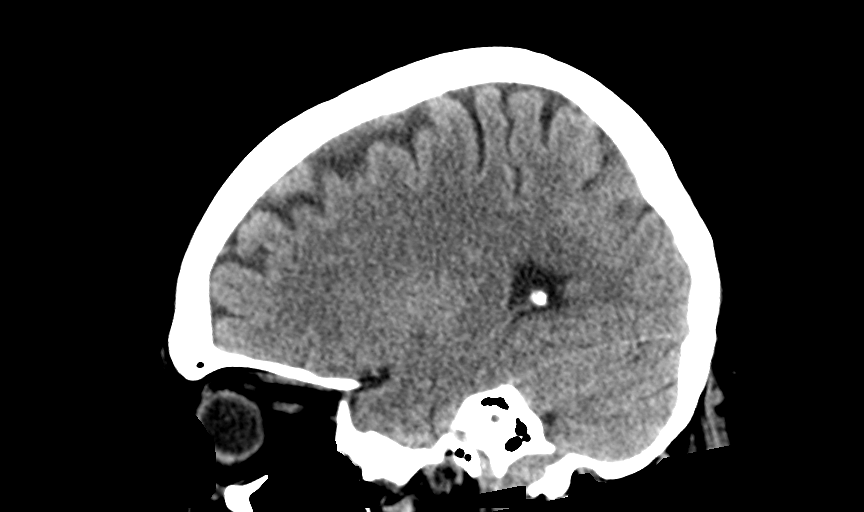
[im 28/56  brain]
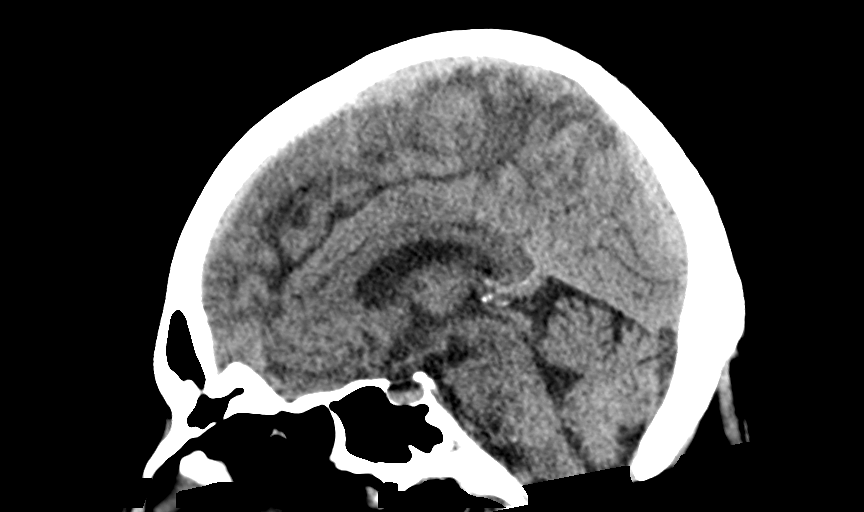
[im 37/56  brain]
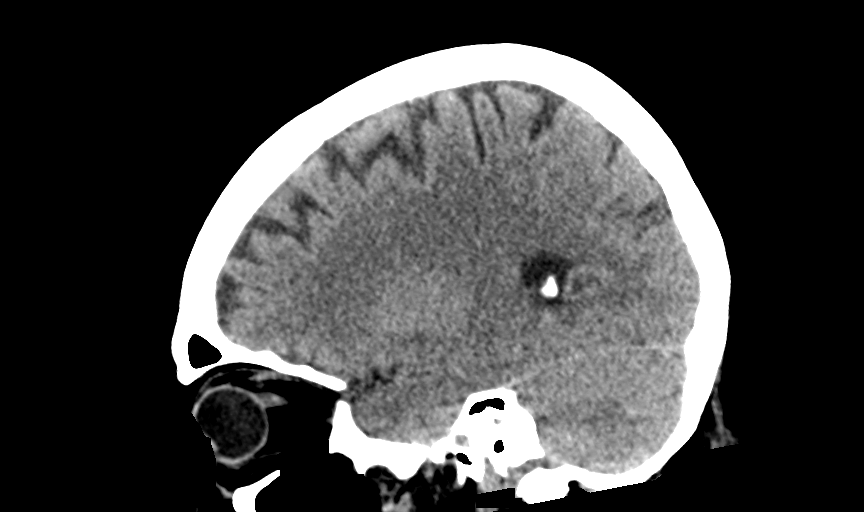

[14 of 47 positions shown; findings below may reference images not displayed]

FINDINGS: Brain: No acute intracranial abnormality. Specifically, no
hemorrhage, hydrocephalus, mass lesion, acute infarction, or
significant intracranial injury.

Vascular: No hyperdense vessel or unexpected calcification.

Skull: No acute calvarial abnormality.

Sinuses/Orbits: Visualized paranasal sinuses and mastoids clear.
Orbital soft tissues unremarkable.

Other: None
IMPRESSION: Negative study.

## 2016-10-09 NOTE — Telephone Encounter (Signed)
completed

## 2017-04-11 ENCOUNTER — Other Ambulatory Visit: Payer: Self-pay | Admitting: Nurse Practitioner
